# Patient Record
Sex: Male | Born: 1950 | Race: White | Hispanic: No | Smoking: Never smoker
Health system: Southern US, Community
[De-identification: ages and names within clinical notes are randomized; demographics above are authoritative.]

## PROBLEM LIST (undated history)

## (undated) DIAGNOSIS — D126 Benign neoplasm of colon, unspecified: Secondary | ICD-10-CM

## (undated) DIAGNOSIS — G894 Chronic pain syndrome: Secondary | ICD-10-CM

## (undated) DIAGNOSIS — K219 Gastro-esophageal reflux disease without esophagitis: Secondary | ICD-10-CM

## (undated) DIAGNOSIS — K2960 Other gastritis without bleeding: Secondary | ICD-10-CM

## (undated) DIAGNOSIS — M25569 Pain in unspecified knee: Secondary | ICD-10-CM

## (undated) DIAGNOSIS — M545 Low back pain, unspecified: Secondary | ICD-10-CM

## (undated) DIAGNOSIS — J302 Other seasonal allergic rhinitis: Secondary | ICD-10-CM

## (undated) DIAGNOSIS — I429 Cardiomyopathy, unspecified: Secondary | ICD-10-CM

## (undated) DIAGNOSIS — R29898 Other symptoms and signs involving the musculoskeletal system: Secondary | ICD-10-CM

## (undated) DIAGNOSIS — I209 Angina pectoris, unspecified: Secondary | ICD-10-CM

## (undated) DIAGNOSIS — F1120 Opioid dependence, uncomplicated: Secondary | ICD-10-CM

## (undated) DIAGNOSIS — G8918 Other acute postprocedural pain: Secondary | ICD-10-CM

## (undated) DIAGNOSIS — M720 Palmar fascial fibromatosis [Dupuytren]: Secondary | ICD-10-CM

## (undated) DIAGNOSIS — C61 Malignant neoplasm of prostate: Secondary | ICD-10-CM

## (undated) DIAGNOSIS — F32A Depression, unspecified: Secondary | ICD-10-CM

## (undated) DIAGNOSIS — G8929 Other chronic pain: Secondary | ICD-10-CM

## (undated) DIAGNOSIS — G709 Myoneural disorder, unspecified: Secondary | ICD-10-CM

## (undated) DIAGNOSIS — M542 Cervicalgia: Secondary | ICD-10-CM

## (undated) DIAGNOSIS — M797 Fibromyalgia: Secondary | ICD-10-CM

## (undated) DIAGNOSIS — M79602 Pain in left arm: Secondary | ICD-10-CM

## (undated) DIAGNOSIS — K5790 Diverticulosis of intestine, part unspecified, without perforation or abscess without bleeding: Secondary | ICD-10-CM

## (undated) DIAGNOSIS — J45909 Unspecified asthma, uncomplicated: Secondary | ICD-10-CM

## (undated) DIAGNOSIS — M79672 Pain in left foot: Secondary | ICD-10-CM

## (undated) DIAGNOSIS — C4490 Unspecified malignant neoplasm of skin, unspecified: Secondary | ICD-10-CM

## (undated) DIAGNOSIS — E785 Hyperlipidemia, unspecified: Secondary | ICD-10-CM

## (undated) DIAGNOSIS — R002 Palpitations: Secondary | ICD-10-CM

## (undated) DIAGNOSIS — F418 Other specified anxiety disorders: Secondary | ICD-10-CM

## (undated) DIAGNOSIS — M1990 Unspecified osteoarthritis, unspecified site: Secondary | ICD-10-CM

## (undated) DIAGNOSIS — G4730 Sleep apnea, unspecified: Secondary | ICD-10-CM

## (undated) DIAGNOSIS — M79671 Pain in right foot: Secondary | ICD-10-CM

## (undated) DIAGNOSIS — M79601 Pain in right arm: Secondary | ICD-10-CM

## (undated) DIAGNOSIS — I428 Other cardiomyopathies: Secondary | ICD-10-CM

## (undated) HISTORY — DX: Pain in right foot: M79.671

## (undated) HISTORY — DX: Pain in unspecified knee: M25.569

## (undated) HISTORY — DX: Other specified anxiety disorders: F41.8

## (undated) HISTORY — DX: Other acute postprocedural pain: G89.18

## (undated) HISTORY — PX: HAND SURGERY: SHX662

## (undated) HISTORY — DX: Gastro-esophageal reflux disease without esophagitis: K21.9

## (undated) HISTORY — DX: Diverticulosis of intestine, part unspecified, without perforation or abscess without bleeding: K57.90

## (undated) HISTORY — DX: Unspecified osteoarthritis, unspecified site: M19.90

## (undated) HISTORY — DX: Pain in left foot: M79.672

## (undated) HISTORY — PX: OTHER SURGICAL HISTORY: SHX169

## (undated) HISTORY — DX: Sleep apnea, unspecified: G47.30

## (undated) HISTORY — DX: Malignant neoplasm of prostate: C61

## (undated) HISTORY — DX: Other symptoms and signs involving the musculoskeletal system: R29.898

## (undated) HISTORY — DX: Hyperlipidemia, unspecified: E78.5

## (undated) HISTORY — DX: Cervicalgia: M54.2

## (undated) HISTORY — DX: Low back pain, unspecified: M54.50

## (undated) HISTORY — PX: NOSE SURGERY: SHX723

## (undated) HISTORY — PX: HERNIA REPAIR: SHX51

## (undated) HISTORY — DX: Other seasonal allergic rhinitis: J30.2

## (undated) HISTORY — DX: Pain in right arm: M79.601

## (undated) HISTORY — DX: Low back pain: M54.5

## (undated) HISTORY — DX: Pain in left arm: M79.602

## (undated) HISTORY — DX: Palmar fascial fibromatosis (dupuytren): M72.0

---

## 2005-11-23 ENCOUNTER — Ambulatory Visit: Payer: Self-pay | Admitting: Urology

## 2006-01-10 ENCOUNTER — Ambulatory Visit: Payer: Self-pay | Admitting: Radiation Oncology

## 2006-01-13 ENCOUNTER — Ambulatory Visit: Payer: Self-pay | Admitting: Radiation Oncology

## 2006-02-11 ENCOUNTER — Ambulatory Visit: Payer: Self-pay | Admitting: Radiation Oncology

## 2006-03-13 ENCOUNTER — Ambulatory Visit: Payer: Self-pay | Admitting: Radiation Oncology

## 2006-04-13 ENCOUNTER — Ambulatory Visit: Payer: Self-pay | Admitting: Radiation Oncology

## 2006-12-30 ENCOUNTER — Ambulatory Visit: Payer: Self-pay | Admitting: Otolaryngology

## 2007-01-31 ENCOUNTER — Ambulatory Visit: Payer: Self-pay | Admitting: Gastroenterology

## 2007-09-11 ENCOUNTER — Inpatient Hospital Stay: Payer: Self-pay | Admitting: Otolaryngology

## 2007-09-14 HISTORY — PX: TONSILLECTOMY: SUR1361

## 2007-11-23 ENCOUNTER — Ambulatory Visit: Payer: Self-pay | Admitting: Family Medicine

## 2007-12-12 ENCOUNTER — Ambulatory Visit: Payer: Self-pay

## 2007-12-27 ENCOUNTER — Ambulatory Visit: Payer: Self-pay

## 2009-03-11 ENCOUNTER — Ambulatory Visit: Payer: Self-pay | Admitting: Family Medicine

## 2009-05-16 ENCOUNTER — Ambulatory Visit: Payer: Self-pay | Admitting: Gastroenterology

## 2009-05-20 ENCOUNTER — Ambulatory Visit: Payer: Self-pay | Admitting: Gastroenterology

## 2010-10-15 ENCOUNTER — Ambulatory Visit: Payer: Self-pay

## 2010-11-12 ENCOUNTER — Ambulatory Visit: Payer: Self-pay | Admitting: Family Medicine

## 2010-12-14 ENCOUNTER — Ambulatory Visit: Payer: Self-pay | Admitting: Family Medicine

## 2011-04-08 ENCOUNTER — Ambulatory Visit: Payer: Self-pay | Admitting: Otolaryngology

## 2011-05-27 ENCOUNTER — Ambulatory Visit: Payer: Self-pay | Admitting: Otolaryngology

## 2011-10-04 ENCOUNTER — Encounter: Payer: Self-pay | Admitting: Neurology

## 2011-10-15 ENCOUNTER — Encounter: Payer: Self-pay | Admitting: Neurology

## 2012-03-02 ENCOUNTER — Ambulatory Visit: Payer: Self-pay | Admitting: Physical Medicine and Rehabilitation

## 2012-03-21 ENCOUNTER — Ambulatory Visit: Payer: Self-pay | Admitting: Neurology

## 2012-07-25 ENCOUNTER — Other Ambulatory Visit (HOSPITAL_COMMUNITY): Payer: Self-pay | Admitting: Neurosurgery

## 2012-07-25 DIAGNOSIS — C61 Malignant neoplasm of prostate: Secondary | ICD-10-CM

## 2012-08-02 ENCOUNTER — Encounter (HOSPITAL_COMMUNITY)
Admission: RE | Admit: 2012-08-02 | Discharge: 2012-08-02 | Disposition: A | Discharge status: Home / Self Care | Payer: BC Managed Care – PPO | Source: Ambulatory Visit | Attending: Neurosurgery | Admitting: Neurosurgery

## 2012-08-02 DIAGNOSIS — C61 Malignant neoplasm of prostate: Secondary | ICD-10-CM | POA: Insufficient documentation

## 2012-08-02 MED ORDER — TECHNETIUM TC 99M MEDRONATE IV KIT
25.0000 | PACK | Freq: Once | INTRAVENOUS | Status: AC | PRN
  Administered 2012-08-02: 24.6 via INTRAVENOUS

## 2012-08-08 ENCOUNTER — Ambulatory Visit: Payer: Self-pay | Admitting: Specialist

## 2012-08-08 LAB — HEMOGLOBIN: HGB: 14 g/dL (ref 13.0–18.0)

## 2012-08-17 ENCOUNTER — Ambulatory Visit: Payer: Self-pay | Admitting: Specialist

## 2012-08-21 LAB — PATHOLOGY REPORT

## 2012-08-29 ENCOUNTER — Ambulatory Visit: Payer: Self-pay | Admitting: Family Medicine

## 2012-10-10 ENCOUNTER — Ambulatory Visit: Payer: Self-pay | Admitting: Gastroenterology

## 2012-10-24 ENCOUNTER — Ambulatory Visit: Payer: Self-pay | Admitting: Gastroenterology

## 2012-10-24 HISTORY — PX: COLONOSCOPY: SHX174

## 2012-10-26 LAB — PATHOLOGY REPORT

## 2012-11-23 ENCOUNTER — Ambulatory Visit: Payer: Self-pay | Admitting: Neurology

## 2012-12-18 ENCOUNTER — Ambulatory Visit: Payer: Self-pay | Admitting: Gastroenterology

## 2012-12-18 HISTORY — PX: UPPER GI ENDOSCOPY: SHX6162

## 2013-09-13 HISTORY — PX: SKIN CANCER EXCISION: SHX779

## 2013-11-15 ENCOUNTER — Ambulatory Visit: Payer: Self-pay | Admitting: Family Medicine

## 2014-02-18 ENCOUNTER — Ambulatory Visit: Payer: Self-pay | Admitting: Unknown Physician Specialty

## 2014-03-30 ENCOUNTER — Ambulatory Visit: Payer: Self-pay | Admitting: Unknown Physician Specialty

## 2014-08-31 ENCOUNTER — Ambulatory Visit: Payer: Self-pay | Admitting: Unknown Physician Specialty

## 2014-09-30 DIAGNOSIS — M79672 Pain in left foot: Secondary | ICD-10-CM | POA: Insufficient documentation

## 2014-09-30 DIAGNOSIS — M79671 Pain in right foot: Secondary | ICD-10-CM | POA: Insufficient documentation

## 2014-10-11 ENCOUNTER — Ambulatory Visit: Payer: Self-pay | Admitting: Neurology

## 2014-12-03 ENCOUNTER — Ambulatory Visit: Payer: Self-pay | Admitting: Otolaryngology

## 2014-12-04 DIAGNOSIS — R29898 Other symptoms and signs involving the musculoskeletal system: Secondary | ICD-10-CM | POA: Insufficient documentation

## 2014-12-04 DIAGNOSIS — M79601 Pain in right arm: Secondary | ICD-10-CM | POA: Insufficient documentation

## 2014-12-04 DIAGNOSIS — M79602 Pain in left arm: Secondary | ICD-10-CM

## 2014-12-31 NOTE — Op Note (Signed)
PATIENT NAME:  CLINTON, DRAGONE MR#:  [id number] DATE OF BIRTH:  09-May-1951  DATE OF PROCEDURE:  08/17/2012  PREOPERATIVE DIAGNOSIS: Dupuytren's contracture right hand with extension to right ring and little fingers.   POSTOPERATIVE DIAGNOSIS:  Dupuytren's contracture right hand with extension to right ring and little fingers.     PROCEDURES:  1. Excision of Dupuytren's fascia right palm extending out into right ring finger.  2. Excision of Dupuytren's fascia right little finger.   SURGEON: Christophe Louis, M.D.   ANESTHESIA: General.   COMPLICATIONS: None.  TOURNIQUET TIME: 110 minutes.   DESCRIPTION OF PROCEDURE: After adequate induction of general anesthesia, the right upper extremity is thoroughly prepped with alcohol and ChloraPrep and draped in standard sterile fashion. The extremity is wrapped out with the Esmarch bandage and pneumatic tourniquet elevated to 300 mmHg. Loupe magnification is used. The incision is started proximally at the base of the palm at the area where the contracted tissues starts and heads out towards the ring finger. There is a prominent cord extending all the way to the base of the ring finger and then prominent Dupuytren's fascia over the proximal aspect of the proximal phalanx. There is a 40 degree flexion contracture of the MP joint. The volar Bruner incision is extended out distally and under loupe magnification the flaps are elevated and the neurovascular bundles are identified on each side. The neurovascular bundles are protected throughout the case. The large cord of scarred fascia is excised in the palm. The flaps are then elevated over the volar aspect of the ring finger and the digital neurovascular bundles completely followed out to the midportion of the middle phalanx. This allows complete excision of the fascia overlying the proximal phalanx. There is complete release of the MP joint contracture. A separate transverse incision is then made extending  ulnarly over the volar A1 pulley area of the little finger where there is a large area of contracted fascia with a slight flexion contracture of the MP joint. Under loupe magnification, the neurovascular bundles were identified. The flaps are elevated off of the fascia and the fascia is excised. The flexion contracture of the MP joint is completely relieved. The wounds are then thoroughly irrigated multiple times. Ulnar nerve block is then performed proximally using plain 0.5% Marcaine. The wound is then closed with multiple 5-0 nylon sutures. Soft bulky dressing is applied with a volar splint. The patient is returned to the recovery room following tourniquet release having tolerated the procedure quite well.  ____________________________ Lucas Mallow, MD ces:ap D: 08/17/2012 15:44:21 ET T: 08/17/2012 17:09:42 ET JOB#: [id number]  cc: Lucas Mallow, MD, <Dictator>  Lucas Mallow MD ELECTRONICALLY SIGNED 08/18/2012 9:27

## 2015-03-10 ENCOUNTER — Ambulatory Visit: Payer: Self-pay | Admitting: Urology

## 2015-03-10 ENCOUNTER — Encounter (INDEPENDENT_AMBULATORY_CARE_PROVIDER_SITE_OTHER): Payer: Self-pay

## 2015-04-11 DIAGNOSIS — M5410 Radiculopathy, site unspecified: Secondary | ICD-10-CM | POA: Insufficient documentation

## 2015-07-14 ENCOUNTER — Encounter: Payer: Self-pay | Admitting: *Deleted

## 2015-07-18 ENCOUNTER — Ambulatory Visit: Payer: BLUE CROSS/BLUE SHIELD | Admitting: Anesthesiology

## 2015-07-18 ENCOUNTER — Encounter
Admission: RE | Disposition: A | Discharge status: Home / Self Care | Payer: Self-pay | Source: Ambulatory Visit | Attending: Unknown Physician Specialty

## 2015-07-18 ENCOUNTER — Ambulatory Visit
Admission: RE | Admit: 2015-07-18 | Discharge: 2015-07-18 | Disposition: A | Discharge status: Home / Self Care | Payer: BLUE CROSS/BLUE SHIELD | Source: Ambulatory Visit | Attending: Unknown Physician Specialty | Admitting: Unknown Physician Specialty

## 2015-07-18 DIAGNOSIS — Z9889 Other specified postprocedural states: Secondary | ICD-10-CM | POA: Insufficient documentation

## 2015-07-18 DIAGNOSIS — Z8489 Family history of other specified conditions: Secondary | ICD-10-CM | POA: Insufficient documentation

## 2015-07-18 DIAGNOSIS — G4730 Sleep apnea, unspecified: Secondary | ICD-10-CM | POA: Diagnosis not present

## 2015-07-18 DIAGNOSIS — K219 Gastro-esophageal reflux disease without esophagitis: Secondary | ICD-10-CM | POA: Diagnosis not present

## 2015-07-18 DIAGNOSIS — E785 Hyperlipidemia, unspecified: Secondary | ICD-10-CM | POA: Insufficient documentation

## 2015-07-18 DIAGNOSIS — Z791 Long term (current) use of non-steroidal anti-inflammatories (NSAID): Secondary | ICD-10-CM | POA: Insufficient documentation

## 2015-07-18 DIAGNOSIS — Z79899 Other long term (current) drug therapy: Secondary | ICD-10-CM | POA: Insufficient documentation

## 2015-07-18 DIAGNOSIS — J302 Other seasonal allergic rhinitis: Secondary | ICD-10-CM | POA: Diagnosis not present

## 2015-07-18 DIAGNOSIS — L720 Epidermal cyst: Secondary | ICD-10-CM | POA: Insufficient documentation

## 2015-07-18 DIAGNOSIS — Z801 Family history of malignant neoplasm of trachea, bronchus and lung: Secondary | ICD-10-CM | POA: Diagnosis not present

## 2015-07-18 DIAGNOSIS — R2231 Localized swelling, mass and lump, right upper limb: Secondary | ICD-10-CM | POA: Diagnosis present

## 2015-07-18 DIAGNOSIS — Z7982 Long term (current) use of aspirin: Secondary | ICD-10-CM | POA: Insufficient documentation

## 2015-07-18 DIAGNOSIS — M1990 Unspecified osteoarthritis, unspecified site: Secondary | ICD-10-CM | POA: Insufficient documentation

## 2015-07-18 DIAGNOSIS — Z8042 Family history of malignant neoplasm of prostate: Secondary | ICD-10-CM | POA: Diagnosis not present

## 2015-07-18 DIAGNOSIS — Z8546 Personal history of malignant neoplasm of prostate: Secondary | ICD-10-CM | POA: Insufficient documentation

## 2015-07-18 DIAGNOSIS — Z79891 Long term (current) use of opiate analgesic: Secondary | ICD-10-CM | POA: Insufficient documentation

## 2015-07-18 DIAGNOSIS — Z800 Family history of malignant neoplasm of digestive organs: Secondary | ICD-10-CM | POA: Insufficient documentation

## 2015-07-18 DIAGNOSIS — K5790 Diverticulosis of intestine, part unspecified, without perforation or abscess without bleeding: Secondary | ICD-10-CM | POA: Insufficient documentation

## 2015-07-18 HISTORY — PX: MASS EXCISION: SHX2000

## 2015-07-18 SURGERY — EXCISION MASS
Anesthesia: General | Laterality: Right | Wound class: Clean

## 2015-07-18 MED ORDER — PROPOFOL 10 MG/ML IV BOLUS
INTRAVENOUS | Status: DC | PRN
  Administered 2015-07-18: 130 mg via INTRAVENOUS

## 2015-07-18 MED ORDER — GLYCOPYRROLATE 0.2 MG/ML IJ SOLN
INTRAMUSCULAR | Status: DC | PRN
  Administered 2015-07-18: 0.1 mg via INTRAVENOUS

## 2015-07-18 MED ORDER — PROMETHAZINE HCL 25 MG/ML IJ SOLN: 6.2500 mg | INTRAMUSCULAR | Status: DC | PRN

## 2015-07-18 MED ORDER — HYDROMORPHONE HCL 1 MG/ML IJ SOLN
0.2500 mg | INTRAMUSCULAR | Status: DC | PRN
  Administered 2015-07-18 (×2): 0.5 mg via INTRAVENOUS

## 2015-07-18 MED ORDER — DEXAMETHASONE SODIUM PHOSPHATE 4 MG/ML IJ SOLN
INTRAMUSCULAR | Status: DC | PRN
  Administered 2015-07-18 (×2): 4 mg via INTRAVENOUS

## 2015-07-18 MED ORDER — MEPERIDINE HCL 25 MG/ML IJ SOLN: 6.2500 mg | INTRAMUSCULAR | Status: DC | PRN

## 2015-07-18 MED ORDER — OXYCODONE HCL 5 MG/5ML PO SOLN: 5.0000 mg | Freq: Once | ORAL | Status: DC | PRN

## 2015-07-18 MED ORDER — MIDAZOLAM HCL 5 MG/5ML IJ SOLN
INTRAMUSCULAR | Status: DC | PRN
  Administered 2015-07-18: 2 mg via INTRAVENOUS

## 2015-07-18 MED ORDER — FENTANYL CITRATE (PF) 100 MCG/2ML IJ SOLN
INTRAMUSCULAR | Status: DC | PRN
  Administered 2015-07-18: 100 ug via INTRAVENOUS

## 2015-07-18 MED ORDER — OXYCODONE HCL 5 MG PO TABS: 5.0000 mg | ORAL_TABLET | Freq: Once | ORAL | Status: DC | PRN

## 2015-07-18 MED ORDER — LACTATED RINGERS IV SOLN
INTRAVENOUS | Status: DC
  Administered 2015-07-18: 07:00:00 via INTRAVENOUS

## 2015-07-18 MED ORDER — LIDOCAINE HCL (CARDIAC) 20 MG/ML IV SOLN
INTRAVENOUS | Status: DC | PRN
Start: 2015-07-18 — End: 2015-07-18
  Administered 2015-07-18: 50 mg via INTRATRACHEAL

## 2015-07-18 SURGICAL SUPPLY — 22 items
BNDG ESMARK 4X12 TAN STRL LF (GAUZE/BANDAGES/DRESSINGS) ×3 IMPLANT
CANISTER SUCT 1200ML W/VALVE (MISCELLANEOUS) ×3 IMPLANT
COVER LIGHT HANDLE FLEXIBLE (MISCELLANEOUS) ×6 IMPLANT
CUFF TOURN SGL QUICK 18 (TOURNIQUET CUFF) ×3 IMPLANT
DURAPREP 26ML APPLICATOR (WOUND CARE) ×3 IMPLANT
GAUZE SPONGE 4X4 12PLY STRL (GAUZE/BANDAGES/DRESSINGS) ×3 IMPLANT
GAUZE STRETCH 2X75IN STRL (MISCELLANEOUS) ×3 IMPLANT
GLOVE BIO SURGEON STRL SZ7.5 (GLOVE) ×6 IMPLANT
GLOVE BIO SURGEON STRL SZ8 (GLOVE) ×3 IMPLANT
GLOVE INDICATOR 8.0 STRL GRN (GLOVE) ×6 IMPLANT
GOWN STRL REUS W/ TWL LRG LVL3 (GOWN DISPOSABLE) ×2 IMPLANT
GOWN STRL REUS W/TWL LRG LVL3 (GOWN DISPOSABLE) ×4
NS IRRIG 500ML POUR BTL (IV SOLUTION) ×3 IMPLANT
PACK EXTREMITY ARMC (MISCELLANEOUS) ×3 IMPLANT
PAD GROUND ADULT SPLIT (MISCELLANEOUS) ×3 IMPLANT
PADDING CAST 3IN STRL (MISCELLANEOUS) ×2
PADDING CAST BLEND 3X4 STRL (MISCELLANEOUS) ×1 IMPLANT
SCRUB POVIDONE IODINE 4 OZ (MISCELLANEOUS) ×3 IMPLANT
STOCKINETTE STRL 6IN 960660 (GAUZE/BANDAGES/DRESSINGS) ×3 IMPLANT
SUT ETHILON 4-0 (SUTURE) ×2
SUT ETHILON 4-0 FS2 18XMFL BLK (SUTURE) ×1
SUTURE ETHLN 4-0 FS2 18XMF BLK (SUTURE) ×1 IMPLANT

## 2015-07-18 NOTE — Discharge Instructions (Signed)
General Anesthesia, Adult, Care After Refer to this sheet in the next few weeks. These instructions provide you with information on caring for yourself after your procedure. Your health care provider may also give you more specific instructions. Your treatment has been planned according to current medical practices, but problems sometimes occur. Call your health care provider if you have any problems or questions after your procedure. WHAT TO EXPECT AFTER THE PROCEDURE After the procedure, it is typical to experience:  Sleepiness.  Nausea and vomiting. HOME CARE INSTRUCTIONS  For the first 24 hours after general anesthesia:  Have a responsible person with you.  Do not drive a car. If you are alone, do not take public transportation.  Do not drink alcohol.  Do not take medicine that has not been prescribed by your health care provider.  Do not sign important papers or make important decisions.  You may resume a normal diet and activities as directed by your health care provider.  Change bandages (dressings) as directed.  If you have questions or problems that seem related to general anesthesia, call the hospital and ask for the anesthetist or anesthesiologist on call. SEEK MEDICAL CARE IF:  You have nausea and vomiting that continue the day after anesthesia.  You develop a rash. SEEK IMMEDIATE MEDICAL CARE IF:   You have difficulty breathing.  You have chest pain.  You have any allergic problems.   This information is not intended to replace advice given to you by your health care provider. Make sure you discuss any questions you have with your health care provider.   Document Released: 12/06/2000 Document Revised: 09/20/2014 Document Reviewed: 12/29/2011 Elsevier Interactive Patient Education 2016 [employer]  Elevation  Ice pack  Tylenol  RTC in about 10 days  Remove dressing in about 3 days and apply waterproof occlusive bandaid. Change bandaid prn.

## 2015-07-18 NOTE — Anesthesia Postprocedure Evaluation (Signed)
  Anesthesia Post-op Note  Patient: Ricky Mercer  Procedure(s) Performed: Procedure(s) with comments: EXCISION MASS RIGHT HAND (Right) - CPAP  Anesthesia type:General  Patient location: PACU  Post pain: Pain level controlled  Post assessment: Post-op Vital signs reviewed, Patient's Cardiovascular Status Stable, Respiratory Function Stable, Patent Airway and No signs of Nausea or vomiting  Post vital signs: Reviewed and stable  Last Vitals:  Filed Vitals:   07/18/15 0822  BP: 133/92  Pulse: 72  Temp: 36.8 C  Resp: 14    Level of consciousness: awake, alert  and patient cooperative  Complications: No apparent anesthesia complications

## 2015-07-18 NOTE — H&P (Signed)
  H and P reviewed. No changes. Uploaded at later date. 

## 2015-07-18 NOTE — Anesthesia Procedure Notes (Signed)
Procedure Name: LMA Insertion Date/Time: 07/18/2015 7:43 AM Performed by: Mayme Genta Pre-anesthesia Checklist: Patient identified, Emergency Drugs available, Suction available, Timeout performed and Patient being monitored Patient Re-evaluated:Patient Re-evaluated prior to inductionOxygen Delivery Method: Circle system utilized Preoxygenation: Pre-oxygenation with 100% oxygen Intubation Type: IV induction LMA: LMA inserted LMA Size: 4.0 Number of attempts: 1 Placement Confirmation: positive ETCO2 and breath sounds checked- equal and bilateral Tube secured with: Tape

## 2015-07-18 NOTE — Anesthesia Preprocedure Evaluation (Addendum)
Anesthesia Evaluation  Patient identified by MRN, date of birth, ID band Patient awake    Reviewed: Allergy & Precautions, NPO status , Patient's Chart, lab work & pertinent test results, reviewed documented beta blocker date and time   Airway Mallampati: II  TM Distance: >3 FB Neck ROM: Full    Dental no notable dental hx.    Pulmonary sleep apnea and Continuous Positive Airway Pressure Ventilation ,    Pulmonary exam normal        Cardiovascular negative cardio ROS Normal cardiovascular exam     Neuro/Psych PSYCHIATRIC DISORDERS Anxiety    GI/Hepatic Neg liver ROS, GERD  Medicated and Controlled,  Endo/Other  negative endocrine ROS  Renal/GU negative Renal ROS     Musculoskeletal  (+) Arthritis , Osteoarthritis,    Abdominal   Peds  Hematology negative hematology ROS (+)   Anesthesia Other Findings   Reproductive/Obstetrics                             Anesthesia Physical Anesthesia Plan  ASA: II  Anesthesia Plan: General   Post-op Pain Management:    Induction: Intravenous  Airway Management Planned: LMA  Additional Equipment:   Intra-op Plan:   Post-operative Plan:   Informed Consent: I have reviewed the patients History and Physical, chart, labs and discussed the procedure including the risks, benefits and alternatives for the proposed anesthesia with the patient or authorized representative who has indicated his/her understanding and acceptance.     Plan Discussed with: CRNA  Anesthesia Plan Comments:        Anesthesia Quick Evaluation

## 2015-07-18 NOTE — Transfer of Care (Signed)
Immediate Anesthesia Transfer of Care Note  Patient: [name]  Procedure(s) Performed: Procedure(s) with comments: EXCISION MASS RIGHT HAND (Right) - CPAP  Patient Location: PACU  Anesthesia Type: General  Level of Consciousness: awake, alert  and patient cooperative  Airway and Oxygen Therapy: Patient Spontanous Breathing and Patient connected to supplemental oxygen  Post-op Assessment: Post-op Vital signs reviewed, Patient's Cardiovascular Status Stable, Respiratory Function Stable, Patent Airway and No signs of Nausea or vomiting  Post-op Vital Signs: Reviewed and stable  Complications: No apparent anesthesia complications

## 2015-07-18 NOTE — Op Note (Signed)
07/18/2015  8:32 AM  PATIENT:  Ricky Mercer  64 y.o. male  PRE-OPERATIVE DIAGNOSIS:  RIGHT HAND MASS R22.31  POST-OPERATIVE DIAGNOSIS:  RIGHT HAND MASS  PROCEDURE:  Procedure(s) with comments: EXCISION MASS RIGHT HAND (Right) - CPAP  SURGEON:   Mariel Kansky., MD  ASSISTANTS: none  ANESTHESIA: Gen.  IMPLANTS: None  HISTORY: Patient had a remote Dupuytren's contracture excised from his right hand but subsequently developed a small mass in the proximal portion of his incision. It was symptomatic. The patient was desirous of having the mass excised.  OP NOTE: The patient was taken the operating room where satisfactory general anesthesia was achieved. A tourniquet was applied the patient's right upper arm. The right upper extremity was prepped and draped in usual fashion for procedure about the hand. The right upper extremity was then exsanguinated and the tourniquet was inflated. About a 1 inch incision was made obliquely across the mass. This was in the proximal palm and I actually used a portion of the patient's previous incision to expose expose the mass. I bluntly and sharply dissected the mass from the surrounding soft tissue. Dissection was fairly easy. The mass was somewhat fluctuant. It was not entrapped in any significant scar material. I was able to excise the mass in its entirety. I did make a small incision in the mass after it had been removed. The mass was filled with a thick whitish fluid. The mass was about 2 cm in diameter. I did send the mass to pathology.  The tourniquet was released at this time. It was only up 6 minutes. Bleeding was controlled with coagulation cautery. The skin was closed with 4-0 nylon sutures in vertical mattress fashion. Betadine was applied to the wounds followed by a sterile dressing. The patient was then awakened and transferred to a stretcher bed. He was taken to the recovery room in satisfactory condition. Blood loss was  negligible.

## 2015-07-21 ENCOUNTER — Encounter: Payer: Self-pay | Admitting: Unknown Physician Specialty

## 2015-07-22 LAB — SURGICAL PATHOLOGY

## 2015-07-23 ENCOUNTER — Other Ambulatory Visit: Payer: Self-pay | Admitting: Pain Medicine

## 2015-07-23 ENCOUNTER — Ambulatory Visit: Payer: BLUE CROSS/BLUE SHIELD | Attending: Pain Medicine | Admitting: Pain Medicine

## 2015-07-23 ENCOUNTER — Encounter: Payer: Self-pay | Admitting: Pain Medicine

## 2015-07-23 VITALS — BP 129/84 | HR 83 | Temp 98.7°F | Resp 16 | Ht 69.0 in | Wt 193.0 lb

## 2015-07-23 DIAGNOSIS — M79601 Pain in right arm: Secondary | ICD-10-CM

## 2015-07-23 DIAGNOSIS — M47896 Other spondylosis, lumbar region: Secondary | ICD-10-CM | POA: Diagnosis not present

## 2015-07-23 DIAGNOSIS — F1190 Opioid use, unspecified, uncomplicated: Secondary | ICD-10-CM | POA: Diagnosis not present

## 2015-07-23 DIAGNOSIS — M5441 Lumbago with sciatica, right side: Secondary | ICD-10-CM

## 2015-07-23 DIAGNOSIS — M549 Dorsalgia, unspecified: Secondary | ICD-10-CM

## 2015-07-23 DIAGNOSIS — M25559 Pain in unspecified hip: Secondary | ICD-10-CM | POA: Diagnosis not present

## 2015-07-23 DIAGNOSIS — F329 Major depressive disorder, single episode, unspecified: Secondary | ICD-10-CM | POA: Diagnosis not present

## 2015-07-23 DIAGNOSIS — G4730 Sleep apnea, unspecified: Secondary | ICD-10-CM

## 2015-07-23 DIAGNOSIS — M47892 Other spondylosis, cervical region: Secondary | ICD-10-CM

## 2015-07-23 DIAGNOSIS — M545 Low back pain, unspecified: Secondary | ICD-10-CM

## 2015-07-23 DIAGNOSIS — M4802 Spinal stenosis, cervical region: Secondary | ICD-10-CM

## 2015-07-23 DIAGNOSIS — M5442 Lumbago with sciatica, left side: Secondary | ICD-10-CM

## 2015-07-23 DIAGNOSIS — M5382 Other specified dorsopathies, cervical region: Secondary | ICD-10-CM

## 2015-07-23 DIAGNOSIS — E7800 Pure hypercholesterolemia, unspecified: Secondary | ICD-10-CM | POA: Diagnosis not present

## 2015-07-23 DIAGNOSIS — Z79891 Long term (current) use of opiate analgesic: Secondary | ICD-10-CM | POA: Diagnosis not present

## 2015-07-23 DIAGNOSIS — M47812 Spondylosis without myelopathy or radiculopathy, cervical region: Secondary | ICD-10-CM

## 2015-07-23 DIAGNOSIS — M4726 Other spondylosis with radiculopathy, lumbar region: Secondary | ICD-10-CM

## 2015-07-23 DIAGNOSIS — R52 Pain, unspecified: Secondary | ICD-10-CM | POA: Diagnosis present

## 2015-07-23 DIAGNOSIS — Z79899 Other long term (current) drug therapy: Secondary | ICD-10-CM | POA: Diagnosis not present

## 2015-07-23 DIAGNOSIS — R7989 Other specified abnormal findings of blood chemistry: Secondary | ICD-10-CM

## 2015-07-23 DIAGNOSIS — M5416 Radiculopathy, lumbar region: Secondary | ICD-10-CM

## 2015-07-23 DIAGNOSIS — E291 Testicular hypofunction: Secondary | ICD-10-CM

## 2015-07-23 DIAGNOSIS — M79604 Pain in right leg: Secondary | ICD-10-CM

## 2015-07-23 DIAGNOSIS — M5481 Occipital neuralgia: Secondary | ICD-10-CM | POA: Insufficient documentation

## 2015-07-23 DIAGNOSIS — M47816 Spondylosis without myelopathy or radiculopathy, lumbar region: Secondary | ICD-10-CM

## 2015-07-23 DIAGNOSIS — M25561 Pain in right knee: Secondary | ICD-10-CM

## 2015-07-23 DIAGNOSIS — Z8546 Personal history of malignant neoplasm of prostate: Secondary | ICD-10-CM | POA: Diagnosis not present

## 2015-07-23 DIAGNOSIS — Z5181 Encounter for therapeutic drug level monitoring: Secondary | ICD-10-CM | POA: Insufficient documentation

## 2015-07-23 DIAGNOSIS — M5412 Radiculopathy, cervical region: Secondary | ICD-10-CM

## 2015-07-23 DIAGNOSIS — M79673 Pain in unspecified foot: Secondary | ICD-10-CM

## 2015-07-23 DIAGNOSIS — M79602 Pain in left arm: Secondary | ICD-10-CM

## 2015-07-23 DIAGNOSIS — M79605 Pain in left leg: Secondary | ICD-10-CM

## 2015-07-23 DIAGNOSIS — G629 Polyneuropathy, unspecified: Secondary | ICD-10-CM | POA: Insufficient documentation

## 2015-07-23 DIAGNOSIS — M542 Cervicalgia: Secondary | ICD-10-CM

## 2015-07-23 DIAGNOSIS — F1120 Opioid dependence, uncomplicated: Secondary | ICD-10-CM

## 2015-07-23 DIAGNOSIS — M79606 Pain in leg, unspecified: Secondary | ICD-10-CM

## 2015-07-23 DIAGNOSIS — M546 Pain in thoracic spine: Secondary | ICD-10-CM

## 2015-07-23 DIAGNOSIS — F32A Depression, unspecified: Secondary | ICD-10-CM

## 2015-07-23 DIAGNOSIS — G8929 Other chronic pain: Secondary | ICD-10-CM | POA: Insufficient documentation

## 2015-07-23 DIAGNOSIS — M79603 Pain in arm, unspecified: Secondary | ICD-10-CM

## 2015-07-23 MED ORDER — HYDROCODONE-ACETAMINOPHEN 7.5-325 MG PO TABS: 1.0000 | ORAL_TABLET | Freq: Three times a day (TID) | ORAL | Status: DC | PRN

## 2015-07-23 NOTE — Progress Notes (Signed)
Patient's Name: Ricky Mercer MRN: [id number] DOB: 04-20-1951 DOS: 07/23/2015  Primary Reason(s) for Visit: Encounter for Medication Management. CC: Pain   HPI:   Ricky Mercer is a 64 y.o. year old, male patient, who returns today as an established patient. He has Hand weakness; Polyradiculopathy; Chronic pain; Long term current use of opiate analgesic; Long term prescription opiate use; Opiate use; Opiate dependence ([place]); Encounter for therapeutic drug level monitoring; Chronic low back pain (primary symptom) (L>R); Chronic lower extremity pain (Bilateral) (L>R); Chronic lumbar radicular pain (Bilateral) (L>R) (Bilateral S1 dermatomal distribution); Chronic hip pain (Bilateral) (L>R); Chronic upper back pain (Bilateral) (L>R); Chronic neck pain; Chronic foot pain; Chronic pain of right knee; Cervical spondylosis; 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); Chronic upper extremity pain (Bilateral) (L>R); Lumbar spondylosis; Cervical facet hypertrophy; Cervical facet syndrome; History of prostate cancer; Radicular pain of shoulder; Class I Morbid obesity (HCC) (68% higher incidence of chronic low back problems); Peripheral neuropathy (HCC) (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 His primarily concern today is the Pain    The patient returns to the clinic today referring that his last prescription was for Norco 10. Unfortunately, he says that when he takes them Norco 10/325 he has a weird sensation that he does not like. He seems to tolerate better than Norco 7.5/325.  In reviewing his pain today, indicates that the primary source of pain is the lower back with pain being worse on the left than the right. He also has pain in the center of the lower back. His second worst pain is the lower extremities with pain on both of them, but the left is worse than the right.  The pain goes down on both legs to the bottom of his feet in what seems to be an S1 dermatomal distribution. The pain runs down the leg thru the back of the. He also has bilateral hip pain where the left is worse than the right. He had an MRI of the lumbar spine done at [facility] and/or [facility] which we will try to get a hold of. His third worst pain is the upper back with the left side being worst on the right. He also has some pain in the center. From there the pain seems to travel to his fourth worst site which is the neck where the pain is worse on the left more than the right. Neck pain goes to the occipital region and the patient describes bilateral greater occipital neuralgia with the left being greater than the right. He also indicates having had a cervical MRI, which we will also try to get a hold of. His fifth and last status source of pain is the shoulders with the left being worse than the right. In the case of the right side the pain goes only to the shoulder area. In the case of the left side he goes down to the upper arm area and into the left chest area with some significant numbness over today last 2 fingers in his hand in what appears to be a C8 dermatomal distribution/[employer]. He indicates that occasionally he has dropped things from this arm. Today's Pain Score: 8  Clear symptom exaggeration. Reported level of pain is incompatible with clinical observations. Pain Type: Chronic pain Pain Location: Neck Pain Descriptors / Indicators: Sharp Pain Frequency: Constant  Date of Last Visit:   04/23/2015 Service Provided on  Last Visit:   Medication management and postprocedure evaluation.  Pharmacotherapy Review:   Side-effects or Adverse reactions: None reported. Effectiveness: Described as relatively effective but with some room for improvement. Onset of action: Within expected pharmacological parameters. (30-40 minutes) Duration of action: Shorter than expected.  This could suggest rapid metabolism or elimination of the pharmacological agent. (3-4 hours) Peak effect: Timing and results are as within normal expected parameters. (1.25 hours) [place] PMP: Compliant with practice rules and regulations. DST: Compliant with practice rules and regulations. Lab work: No new labs ordered by our practice. Treatment compliance: Compliant. Substance Use Disorder (SUD) Risk Level: Moderate Planned course of action: Adjust therapy. Today we will lower the dose from the [place] 10/325 2 the Norco 7.5/325, but we will allow him to increase the daily dose by 1 so that he takes it 3 times a day instead of twice a day.  Post-Procedure Assessment:  Procedure done on last visit: On 03/31/2015 the patient had a trigger point injection done in the area of the thoracic interspinous ligament and paravertebral muscles in the upper back, but this did not provide him with any long-term benefit. Side-effects or Adverse reactions: No significant issues reported. Sedation: No sedation used during procedure.  Results: No benefits.  Allergies: Mr. Ghan has No Known Allergies.  Meds: The patient has a current medication list which includes the following prescription(s): aspirin, cyclobenzaprine, diphenhydramine-acetaminophen, duloxetine, meloxicam, pantoprazole, phenazopyridine, simvastatin, testosterone, and hydrocodone-acetaminophen. Requested Prescriptions   Signed Prescriptions Disp Refills  . HYDROcodone-acetaminophen (NORCO) 7.5-325 MG tablet 90 tablet 0    Sig: Take 1 tablet by mouth every 8 (eight) hours as needed for moderate pain or severe pain.    ROS: Constitutional: Afebrile, no chills, well hydrated and well nourished Gastrointestinal: negative Musculoskeletal:negative Neurological: negative Behavioral/Psych: negative  PFSH: Medical:  Ricky Mercer  has a past medical history of Hyperlipidemia; GERD (gastroesophageal reflux disease); Prostate cancer ([place]);  Seasonal allergies; Situational anxiety; Diverticulosis; Dupuytren's contracture; Knee pain; Midline low back pain; Pain in neck; Foot pain, bilateral; Bilateral arm pain; Hand weakness; Osteoarthritis; and Sleep apnea. Family: family history includes Colon cancer in his brother; Hyperlipidemia in his sister; Lung cancer in his father; Prostate cancer in his brother. Surgical:  has past surgical history that includes Nose surgery; Hand surgery (Bilateral); Colonoscopy (10/24/2012); Upper gi endoscopy (12/18/2012); Tonsillectomy (2009); Skin cancer excision (Left, 2015); and Mass excision (Right, 07/18/2015). Tobacco:  reports that he has never smoked. He does not have any smokeless tobacco history on file. Alcohol:  reports that he does not drink alcohol. Drug:  has no drug history on file.  Physical Exam: Vitals:  Today's Vitals   07/23/15 0908  BP: 129/84  Pulse: 83  Temp: 98.7 F (37.1 C)  TempSrc: Oral  Resp: 16  Height: 5\' 9"  (1.753 m)  Weight: 193 lb (87.544 kg)  SpO2: 98%  PainSc: 8   PainLoc: Back  Calculated BMI: Body mass index is 28.49 kg/(m^2). General appearance: alert, cooperative, appears stated age, mild distress and moderately obese Eyes: conjunctivae/corneas clear. PERRL, EOM's intact. Fundi benign. Lungs: No evidence respiratory distress, no audible rales or ronchi and no use of accessory muscles of respiration Neck: no adenopathy, no carotid bruit, no JVD, supple, symmetrical, trachea midline and thyroid not enlarged, symmetric, no tenderness/mass/nodules Back: symmetric, no curvature. ROM normal. No CVA tenderness. Extremities: extremities normal, atraumatic, no cyanosis or edema Pulses: 2+ and symmetric Skin: Skin color, texture, turgor normal. No rashes or lesions Neurologic: Grossly normal  Assessment: Encounter Diagnosis:  Primary Diagnosis: Chronic pain [G89.29]  Plan: Tremel was seen today for pain.  Diagnoses and all orders for this visit:  Chronic  pain -     COMPLETE METABOLIC PANEL WITH GFR; Future -     C-reactive protein; Future -     Magnesium; Future -     Sedimentation rate; Future -     Vitamin D2,D3 Panel; Future -     HYDROcodone-acetaminophen (NORCO) 7.5-325 MG tablet; Take 1 tablet by mouth every 8 (eight) hours as needed for moderate pain or severe pain.  Long term current use of opiate analgesic  Long term prescription opiate use -     Drugs of abuse screen w/o alc, rtn urine-sln; Standing  Opiate use  Uncomplicated opioid dependence ([place])  Encounter for therapeutic drug level monitoring  Chronic low back pain (primary symptom) (L>R)  Chronic pain of lower extremity, unspecified laterality  Chronic lumbar radicular pain (Bilateral) (L>R) (Bilateral S1 dermatomal distribution)  Chronic hip pain, unspecified laterality  Chronic upper back pain (Bilateral) (L>R)  Chronic neck pain  Chronic foot pain, unspecified laterality  Chronic pain of right knee  Cervical spondylosis  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)  Chronic pain of both upper extremities  Osteoarthritis of spine with radiculopathy, lumbar region  Cervical facet hypertrophy  Cervical facet syndrome  History of prostate cancer  Radicular pain of shoulder  Morbid obesity due to excess calories (HCC)  Lumbar facet syndrome  Sleep apnea  Depression  High cholesterol  Low testosterone     There are no Patient Instructions on file for this visit. Medications discontinued today:  Medications Discontinued During This Encounter  Medication Reason  . HYDROcodone-acetaminophen (NORCO) 7.5-325 MG per tablet Error  . pregabalin (LYRICA) 50 MG capsule Error  . HYDROcodone-acetaminophen (NORCO) 10-325 MG tablet    Medications administered today:  Mr. Hershman had no medications administered during this visit.  Primary Care Physician: [facility], MD Location: [facility] Note  by: Kathlen Brunswick. Dossie Arbour, M.D, DABA, DABAPM, DABPM, DABIPP, FIPP

## 2015-07-23 NOTE — Progress Notes (Signed)
Pill count: Hydrocodone 10/325 - # 12

## 2015-07-23 NOTE — Progress Notes (Signed)
Had an excision of mass surgery on the right hand on July 18, 2015 by Dr. Jefm Bryant at the [facility] surgical center.

## 2015-07-31 LAB — TOXASSURE SELECT 13 (MW), URINE: PDF: 0

## 2015-08-12 DIAGNOSIS — Z9889 Other specified postprocedural states: Secondary | ICD-10-CM | POA: Insufficient documentation

## 2015-08-14 ENCOUNTER — Other Ambulatory Visit: Payer: Self-pay | Admitting: Family Medicine

## 2015-08-14 DIAGNOSIS — R1013 Epigastric pain: Secondary | ICD-10-CM

## 2015-08-19 ENCOUNTER — Ambulatory Visit: Payer: BLUE CROSS/BLUE SHIELD

## 2015-08-20 ENCOUNTER — Ambulatory Visit: Payer: BLUE CROSS/BLUE SHIELD | Attending: Pain Medicine | Admitting: Pain Medicine

## 2015-08-20 ENCOUNTER — Encounter: Payer: Self-pay | Admitting: Pain Medicine

## 2015-08-20 VITALS — BP 127/88 | HR 87 | Temp 98.0°F | Resp 20 | Ht 69.0 in | Wt 190.0 lb

## 2015-08-20 DIAGNOSIS — M5416 Radiculopathy, lumbar region: Secondary | ICD-10-CM | POA: Diagnosis present

## 2015-08-20 DIAGNOSIS — Z5181 Encounter for therapeutic drug level monitoring: Secondary | ICD-10-CM

## 2015-08-20 DIAGNOSIS — M4726 Other spondylosis with radiculopathy, lumbar region: Secondary | ICD-10-CM | POA: Diagnosis not present

## 2015-08-20 DIAGNOSIS — G8929 Other chronic pain: Secondary | ICD-10-CM | POA: Insufficient documentation

## 2015-08-20 DIAGNOSIS — F1190 Opioid use, unspecified, uncomplicated: Secondary | ICD-10-CM

## 2015-08-20 DIAGNOSIS — M5412 Radiculopathy, cervical region: Secondary | ICD-10-CM | POA: Insufficient documentation

## 2015-08-20 DIAGNOSIS — K5903 Drug induced constipation: Secondary | ICD-10-CM

## 2015-08-20 DIAGNOSIS — Z8546 Personal history of malignant neoplasm of prostate: Secondary | ICD-10-CM

## 2015-08-20 DIAGNOSIS — Z79891 Long term (current) use of opiate analgesic: Secondary | ICD-10-CM

## 2015-08-20 DIAGNOSIS — M79606 Pain in leg, unspecified: Secondary | ICD-10-CM

## 2015-08-20 DIAGNOSIS — F1120 Opioid dependence, uncomplicated: Secondary | ICD-10-CM

## 2015-08-20 DIAGNOSIS — G4730 Sleep apnea, unspecified: Secondary | ICD-10-CM

## 2015-08-20 DIAGNOSIS — M545 Low back pain: Secondary | ICD-10-CM

## 2015-08-20 DIAGNOSIS — T402X5A Adverse effect of other opioids, initial encounter: Secondary | ICD-10-CM | POA: Insufficient documentation

## 2015-08-20 DIAGNOSIS — Z79899 Other long term (current) drug therapy: Secondary | ICD-10-CM

## 2015-08-20 MED ORDER — BENEFIBER PO POWD: ORAL | Status: DC

## 2015-08-20 MED ORDER — HYDROCODONE-ACETAMINOPHEN 10-325 MG PO TABS: 1.0000 | ORAL_TABLET | Freq: Four times a day (QID) | ORAL | Status: DC | PRN

## 2015-08-20 MED ORDER — BISACODYL 5 MG PO TBEC
10.0000 mg | DELAYED_RELEASE_TABLET | Freq: Every day | ORAL | Status: DC | PRN
Start: 2015-08-20 — End: 2016-03-18

## 2015-08-20 MED ORDER — DOCUSATE SODIUM 100 MG PO CAPS
100.0000 mg | ORAL_CAPSULE | Freq: Two times a day (BID) | ORAL | Status: DC
Start: 2015-08-20 — End: 2016-03-18

## 2015-08-20 NOTE — Progress Notes (Signed)
Safety precautions to be maintained throughout the outpatient stay will include: orient to surroundings, keep bed in low position, maintain call bell within reach at all times, provide assistance with transfer out of bed and ambulation.  Hydrocodone 7.5mg  21/60 remaining -filled 08/01/15 Was started on citalopram 08/19/15 for aniety

## 2015-08-20 NOTE — Patient Instructions (Signed)
You were given 3 prescriptions  for Hydrocodone today. Please get the labs done that were ordered last month.

## 2015-08-21 ENCOUNTER — Other Ambulatory Visit
Admission: RE | Admit: 2015-08-21 | Discharge: 2015-08-21 | Disposition: A | Discharge status: Home / Self Care | Payer: BLUE CROSS/BLUE SHIELD | Source: Ambulatory Visit | Attending: Pain Medicine | Admitting: Pain Medicine

## 2015-08-21 ENCOUNTER — Other Ambulatory Visit: Payer: Self-pay

## 2015-08-21 DIAGNOSIS — G8929 Other chronic pain: Secondary | ICD-10-CM | POA: Diagnosis not present

## 2015-08-21 LAB — COMPREHENSIVE METABOLIC PANEL
ALBUMIN: 4.1 g/dL (ref 3.5–5.0)
ALT: 26 U/L (ref 17–63)
AST: 23 U/L (ref 15–41)
Alkaline Phosphatase: 57 U/L (ref 38–126)
Anion gap: 5 (ref 5–15)
BILIRUBIN TOTAL: 0.7 mg/dL (ref 0.3–1.2)
BUN: 13 mg/dL (ref 6–20)
CHLORIDE: 108 mmol/L (ref 101–111)
CO2: 25 mmol/L (ref 22–32)
CREATININE: 1.07 mg/dL (ref 0.61–1.24)
Calcium: 9.2 mg/dL (ref 8.9–10.3)
GFR calc Af Amer: >60 mL/min (ref >60)
GFR calc non Af Amer: >60 mL/min (ref >60)
GLUCOSE: 127 mg/dL — AB (ref 65–99)
POTASSIUM: 4 mmol/L (ref 3.5–5.1)
Sodium: 138 mmol/L (ref 135–145)
Total Protein: 7.2 g/dL (ref 6.5–8.1)

## 2015-08-21 LAB — C-REACTIVE PROTEIN

## 2015-08-21 LAB — MAGNESIUM: MAGNESIUM: 2.1 mg/dL (ref 1.7–2.4)

## 2015-08-21 LAB — SEDIMENTATION RATE: Sed Rate: 4 mm/hr (ref 0–20)

## 2015-08-22 ENCOUNTER — Encounter: Payer: Self-pay | Admitting: Pain Medicine

## 2015-08-22 ENCOUNTER — Other Ambulatory Visit: Payer: Self-pay | Admitting: Pain Medicine

## 2015-08-22 DIAGNOSIS — G8929 Other chronic pain: Secondary | ICD-10-CM | POA: Insufficient documentation

## 2015-08-22 DIAGNOSIS — R065 Mouth breathing: Secondary | ICD-10-CM | POA: Insufficient documentation

## 2015-08-22 DIAGNOSIS — M25512 Pain in left shoulder: Secondary | ICD-10-CM

## 2015-08-22 DIAGNOSIS — M25511 Pain in right shoulder: Secondary | ICD-10-CM

## 2015-08-22 NOTE — Progress Notes (Signed)
Patient's Name: Ricky Mercer MRN: [id number] DOB: 04-08-1951 DOS: 08/20/2015  Primary Reason(s) for Visit: Encounter for Medication Management CC: Back Pain and Shoulder Pain   HPI:   Ricky Mercer is a 64 y.o. year old, male patient, who returns today as an established patient. He has Hand weakness; Polyradiculopathy; Chronic pain; Long term current use of opiate analgesic; Long term prescription opiate use; Opiate use; Opiate dependence ([place]); Encounter for therapeutic drug level monitoring; Chronic low back pain  (Location of Primary Pain) (L>R) (LBP>LEP); Chronic lower extremity pain (Bilateral) (L>R); Chronic lumbar radicular pain (Bilateral) (L>R) (Bilateral S1 dermatomal distribution); Chronic hip pain (Bilateral) (L>R); Chronic upper back pain (Bilateral) (L>R); Chronic neck pain (neck<shoulder) (L>R); Chronic foot pain; Chronic knee pain (Right); Cervical spondylosis; 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); Chronic upper extremity pain (Bilateral) (L>R); Lumbar spondylosis; Cervical facet hypertrophy; Cervical facet syndrome; History of prostate cancer; Radicular pain of shoulder; Class I Morbid obesity (HCC) (68% higher incidence of chronic low back problems); Peripheral neuropathy (HCC) (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); Lumbar facet syndrome; Sleep apnea; Depression; High cholesterol; Low testosterone; History of surgical procedure; Therapeutic opioid-induced constipation (OIC); Therapeutic opioid induced constipation; Chronic shoulder pain (Bilateral) (L>R); and Breathing orally on his problem list.. His primarily concern today is the Back Pain and Shoulder Pain     The patient comes into the clinic today for pharmacological management of his chronic pain. Today we have reviewed his symptoms and his primary pain source is the lower back with the left being worst on the right. Following this, second source of pain is that of the lower extremity with  the left being worse than the right. In the case of the right lower extremity the pain goes all the way down to the bottom of the foot in what seems to be an S1 dermatomal distribution. In a similar manner the left lower extremity pain also goes all the way down to the bottom of the foot in what seems to be an S1 dermatomal/radicular distribution. His third source of pain is the hip which although bilateral, seems to be worse on the left when compared to the right. Next follows day shoulder pain which is also bilateral with the left being worse than the right. We then have the neck pain with the left being worse than the right and the upper extremities, again with the left being worse than the right. The patient's cervical problems seem to be affecting known only the neck, shoulder, and upper extremity, but he also complains of occasional pain in that pectoralis muscle, bilaterally, with the left being worse than the right.  His current medication regimen and includes the use of hydrocodone/APAP 7.5/325, which he takes 1 tablet by mouth every 8 hours when necessary for pain.  Today's Pain Score: 7 , clinically he looks more like a 2-3/10. Reported level of pain is incompatible with clinical obrservations. This may be secondary to a possible lack of understanding on how the pain scale works. Pain Type: Chronic pain Pain Location: Back Pain Orientation: Mid Pain Descriptors / Indicators: Burning, Aching, Constant, Tingling Pain Frequency: Constant  Date of Last Visit: 07/23/15 Service Provided on Last Visit: Evaluation  Pharmacotherapy Review:   Side-effects or Adverse reactions: Opioid-induced constipation. Effectiveness: Described as relatively effective but with some room for improvement. Onset of action: Within expected pharmacological parameters. (30-45 minutes) Duration of action: Within normal limits for medication. (3-4 hours) Peak effect: Timing and results are as within normal  expected  parameters. At its peak effect the patient describes that he lowers the pain by 50%. [place] PMP: Compliant with practice rules and regulations. UDS Results: His 07/23/2015 UDS was within normal limits. Patient remains compliant. UDS Interpretation: Patient appears to be compliant with practice rules and regulations Medication Assessment Form: Reviewed. Patient indicates being compliant with therapy Treatment compliance: Compliant Substance Use Disorder (SUD) Risk Level: Low Pharmacologic Plan: Because the peak effect of his medication only lowers to pain by 50%, we will increase the dose from 7.5-10. In addition to this, because of the duration is described to be 3-4 hours only, we'll shorten the interval between doses to 6 hours instead of 8.  Intrathecal Pump Therapy: Side-effects or Adverse reactions: None reported. Effectiveness: Described as relatively effective, allowing for increase in activities of daily living (ADL). Plan: No changes in programming.  Allergies: Ricky Mercer has No Known Allergies.  Meds: The patient has a current medication list which includes the following prescription(s): aspirin, citalopram, diphenhydramine-acetaminophen, meloxicam, pantoprazole, phenazopyridine, simvastatin, testosterone, bisacodyl, docusate sodium, hydrocodone-acetaminophen, hydrocodone-acetaminophen, hydrocodone-acetaminophen, and benefiber. Requested Prescriptions   Signed Prescriptions Disp Refills  . HYDROcodone-acetaminophen (NORCO) 10-325 MG tablet 120 tablet 0    Sig: Take 1 tablet by mouth every 6 (six) hours as needed for moderate pain or severe pain.  Marland Kitchen HYDROcodone-acetaminophen (NORCO) 10-325 MG tablet 120 tablet 0    Sig: Take 1 tablet by mouth every 6 (six) hours as needed for moderate pain or severe pain.  Marland Kitchen HYDROcodone-acetaminophen (NORCO) 10-325 MG tablet 120 tablet 0    Sig: Take 1 tablet by mouth every 6 (six) hours as needed for moderate pain or severe pain.  . Wheat Dextrin  (BENEFIBER) POWD 500 g PRN    Sig: Stir 2 tsp. TID into 4-8 oz of any non-carbonated beverage or soft food (hot or cold)  . bisacodyl (DULCOLAX) 5 MG EC tablet 30 tablet PRN    Sig: Take 2 tablets (10 mg total) by mouth daily as needed for moderate constipation.  . docusate sodium (COLACE) 100 MG capsule 120 capsule PRN    Sig: Take 1 capsule (100 mg total) by mouth 2 (two) times daily. Do not use longer than 7 days.    ROS: Constitutional: Afebrile, no chills, well hydrated and well nourished Gastrointestinal: negative Musculoskeletal:negative Neurological: negative Behavioral/Psych: negative  PFSH: Medical:  Ricky Mercer  has a past medical history of Hyperlipidemia; GERD (gastroesophageal reflux disease); Prostate cancer ([place]); Seasonal allergies; Situational anxiety; Diverticulosis; Dupuytren's contracture; Knee pain; Midline low back pain; Pain in neck; Foot pain, bilateral; Bilateral arm pain; Hand weakness; Osteoarthritis; and Sleep apnea. Family: family history includes Colon cancer in his brother; Hyperlipidemia in his sister; Lung cancer in his father; Prostate cancer in his brother. Surgical:  has past surgical history that includes Nose surgery; Hand surgery (Bilateral); Colonoscopy (10/24/2012); Upper gi endoscopy (12/18/2012); Tonsillectomy (2009); Skin cancer excision (Left, 2015); and Mass excision (Right, 07/18/2015). Tobacco:  reports that he has never smoked. He does not have any smokeless tobacco history on file. Alcohol:  reports that he does not drink alcohol. Drug:  reports that he does not use illicit drugs.  Physical Exam: Vitals:  Today's Vitals   08/20/15 1340 08/20/15 1341  BP: 127/88   Pulse: 87   Temp: 98 F (36.7 C)   Resp: 20   Height: 5\' 9"  (1.753 m)   Weight: 190 lb (86.183 kg)   SpO2: 98%   PainSc:  7   Calculated BMI: Body mass  index is 28.05 kg/(m^2). General appearance: alert, cooperative, appears stated age and no distress Eyes:  PERLA Respiratory: No evidence respiratory distress, no audible rales or ronchi and no use of accessory muscles of respiration Neck: no adenopathy, no carotid bruit, no JVD, supple, symmetrical, trachea midline and thyroid not enlarged, symmetric, no tenderness/mass/nodules  Cervical Spine ROM: Adequate for flexion, extension, rotation, and lateral bending Palpation: No palpable trigger points  Upper Extremities ROM: Adequate bilaterally Strength: 5/5 for all flexors and extensors of the upper extremity, bilaterally Pulses: Palpable bilaterally Neurologic: No allodynia, No hyperesthesia, No hyperpathia and No sensory abnormalities  Lumbar Spine ROM: Adequate for flexion, extension, rotation, and lateral bending Palpation: No palpable trigger points Lumbar Hyperextension and rotation: Non-contributory Patrick's Maneuver: Non-contributory  Lower Extremities ROM: Adequate bilaterally Strength: 5/5 for all flexors and extensors of the lower extremity, bilaterally Pulses: Palpable bilaterally Neurologic: No allodynia, No hyperesthesia, No hyperpathia, No sensory abnormalities and No antalgic gait or posture  Assessment: Encounter Diagnosis:  Primary Diagnosis: Chronic pain [G89.29]  Plan: Interventional: No interventional treatment planned or required at this time.  Ricky Mercer was seen today for back pain and shoulder pain.  Diagnoses and all orders for this visit:  Chronic pain -     HYDROcodone-acetaminophen (NORCO) 10-325 MG tablet; Take 1 tablet by mouth every 6 (six) hours as needed for moderate pain or severe pain. -     HYDROcodone-acetaminophen (NORCO) 10-325 MG tablet; Take 1 tablet by mouth every 6 (six) hours as needed for moderate pain or severe pain. -     HYDROcodone-acetaminophen (NORCO) 10-325 MG tablet; Take 1 tablet by mouth every 6 (six) hours as needed for moderate pain or severe pain.  Radicular pain of shoulder -     MR Cervical Spine Wo Contrast;  Future  Osteoarthritis of spine with radiculopathy, lumbar region -     MR Cervical Spine Wo Contrast; Future  History of prostate cancer  Chronic lumbar radicular pain (Bilateral) (L>R) (Bilateral S1 dermatomal distribution) -     MR Lumbar Spine Wo Contrast; Future  Chronic low back pain (primary symptom) (L>R) -     MR Lumbar Spine Wo Contrast; Future  Chronic pain of lower extremity, unspecified laterality  Opiate use -     Wheat Dextrin (BENEFIBER) POWD; Stir 2 tsp. TID into 4-8 oz of any non-carbonated beverage or soft food (hot or cold) -     bisacodyl (DULCOLAX) 5 MG EC tablet; Take 2 tablets (10 mg total) by mouth daily as needed for moderate constipation. -     docusate sodium (COLACE) 100 MG capsule; Take 1 capsule (100 mg total) by mouth 2 (two) times daily. Do not use longer than 7 days.  Uncomplicated opioid dependence ([place])  Long term prescription opiate use  Long term current use of opiate analgesic  Encounter for therapeutic drug level monitoring  Sleep apnea  Therapeutic opioid induced constipation -     Wheat Dextrin (BENEFIBER) POWD; Stir 2 tsp. TID into 4-8 oz of any non-carbonated beverage or soft food (hot or cold) -     bisacodyl (DULCOLAX) 5 MG EC tablet; Take 2 tablets (10 mg total) by mouth daily as needed for moderate constipation. -     docusate sodium (COLACE) 100 MG capsule; Take 1 capsule (100 mg total) by mouth 2 (two) times daily. Do not use longer than 7 days.     Patient Instructions  You were given 3 prescriptions  for Hydrocodone today. Please get the labs done  that were ordered last month.  Medications discontinued today:  Medications Discontinued During This Encounter  Medication Reason  . DULoxetine (CYMBALTA) 30 MG capsule Error  . HYDROcodone-acetaminophen (NORCO) 7.5-325 MG tablet Change in therapy  . cyclobenzaprine (FLEXERIL) 5 MG tablet Error   Medications administered today:  Ricky Mercer had no medications administered  during this visit.  Primary Care Physician: [facility], MD Location: [facility] Outpatient Pain Management Facility Note by: Kathlen Brunswick. Dossie Arbour, M.D, DABA, DABAPM, DABPM, DABIPP, FIPP

## 2015-08-22 NOTE — Addendum Note (Signed)
Addended by: Milinda Pointer A on: 08/22/2015 02:43 PM   Modules accepted: Orders, Medications

## 2015-08-25 ENCOUNTER — Ambulatory Visit
Admission: RE | Admit: 2015-08-25 | Discharge: 2015-08-25 | Disposition: A | Discharge status: Home / Self Care | Payer: BLUE CROSS/BLUE SHIELD | Source: Ambulatory Visit | Attending: Family Medicine | Admitting: Family Medicine

## 2015-08-25 DIAGNOSIS — R1013 Epigastric pain: Secondary | ICD-10-CM | POA: Diagnosis present

## 2015-08-25 DIAGNOSIS — K824 Cholesterolosis of gallbladder: Secondary | ICD-10-CM | POA: Insufficient documentation

## 2015-09-03 ENCOUNTER — Other Ambulatory Visit: Payer: Self-pay | Admitting: Gastroenterology

## 2015-09-03 DIAGNOSIS — R1084 Generalized abdominal pain: Secondary | ICD-10-CM

## 2015-09-05 ENCOUNTER — Ambulatory Visit
Admission: RE | Admit: 2015-09-05 | Discharge: 2015-09-05 | Disposition: A | Discharge status: Home / Self Care | Payer: BLUE CROSS/BLUE SHIELD | Source: Ambulatory Visit | Attending: Gastroenterology | Admitting: Gastroenterology

## 2015-09-05 DIAGNOSIS — R1013 Epigastric pain: Secondary | ICD-10-CM | POA: Diagnosis present

## 2015-09-05 DIAGNOSIS — R1084 Generalized abdominal pain: Secondary | ICD-10-CM | POA: Insufficient documentation

## 2015-09-05 MED ORDER — IOHEXOL 350 MG/ML SOLN
125.0000 mL | Freq: Once | INTRAVENOUS | Status: AC | PRN
  Administered 2015-09-05: 125 mL via INTRAVENOUS

## 2015-09-10 ENCOUNTER — Encounter: Payer: Self-pay | Admitting: Family Medicine

## 2015-10-01 ENCOUNTER — Other Ambulatory Visit: Payer: Self-pay | Admitting: Gastroenterology

## 2015-10-01 DIAGNOSIS — R1013 Epigastric pain: Secondary | ICD-10-CM

## 2015-10-01 DIAGNOSIS — R1011 Right upper quadrant pain: Secondary | ICD-10-CM

## 2015-10-14 ENCOUNTER — Ambulatory Visit
Admission: RE | Admit: 2015-10-14 | Discharge: 2015-10-14 | Disposition: A | Discharge status: Home / Self Care | Payer: BLUE CROSS/BLUE SHIELD | Source: Ambulatory Visit | Attending: Gastroenterology | Admitting: Gastroenterology

## 2015-10-14 ENCOUNTER — Ambulatory Visit: Admission: RE | Admit: 2015-10-14 | Payer: BLUE CROSS/BLUE SHIELD | Source: Ambulatory Visit

## 2015-10-14 DIAGNOSIS — R1011 Right upper quadrant pain: Secondary | ICD-10-CM | POA: Diagnosis present

## 2015-10-14 DIAGNOSIS — R1013 Epigastric pain: Secondary | ICD-10-CM | POA: Diagnosis present

## 2015-10-14 MED ORDER — TECHNETIUM TC 99M MEBROFENIN IV KIT
5.2290 | PACK | Freq: Once | INTRAVENOUS | Status: AC | PRN
  Administered 2015-10-14: 5.229 via INTRAVENOUS

## 2015-10-14 MED ORDER — SINCALIDE 5 MCG IJ SOLR
0.0200 ug/kg | Freq: Once | INTRAMUSCULAR | Status: AC
  Administered 2015-10-14: 1.73 ug via INTRAVENOUS

## 2015-10-20 ENCOUNTER — Other Ambulatory Visit: Payer: Self-pay

## 2015-10-27 ENCOUNTER — Encounter: Payer: Self-pay | Admitting: *Deleted

## 2015-10-28 ENCOUNTER — Encounter: Payer: Self-pay | Admitting: *Deleted

## 2015-10-28 ENCOUNTER — Ambulatory Visit
Admission: RE | Admit: 2015-10-28 | Discharge: 2015-10-28 | Disposition: A | Discharge status: Home / Self Care | Payer: BLUE CROSS/BLUE SHIELD | Source: Ambulatory Visit | Attending: Gastroenterology | Admitting: Gastroenterology

## 2015-10-28 ENCOUNTER — Ambulatory Visit: Payer: BLUE CROSS/BLUE SHIELD | Admitting: Anesthesiology

## 2015-10-28 ENCOUNTER — Encounter
Admission: RE | Disposition: A | Discharge status: Home / Self Care | Payer: Self-pay | Source: Ambulatory Visit | Attending: Gastroenterology

## 2015-10-28 DIAGNOSIS — R1011 Right upper quadrant pain: Secondary | ICD-10-CM | POA: Diagnosis not present

## 2015-10-28 DIAGNOSIS — K319 Disease of stomach and duodenum, unspecified: Secondary | ICD-10-CM | POA: Insufficient documentation

## 2015-10-28 DIAGNOSIS — Z8546 Personal history of malignant neoplasm of prostate: Secondary | ICD-10-CM | POA: Diagnosis not present

## 2015-10-28 DIAGNOSIS — K2960 Other gastritis without bleeding: Secondary | ICD-10-CM | POA: Diagnosis not present

## 2015-10-28 DIAGNOSIS — R1012 Left upper quadrant pain: Secondary | ICD-10-CM | POA: Insufficient documentation

## 2015-10-28 DIAGNOSIS — K219 Gastro-esophageal reflux disease without esophagitis: Secondary | ICD-10-CM | POA: Diagnosis not present

## 2015-10-28 DIAGNOSIS — G4730 Sleep apnea, unspecified: Secondary | ICD-10-CM | POA: Insufficient documentation

## 2015-10-28 DIAGNOSIS — Z79899 Other long term (current) drug therapy: Secondary | ICD-10-CM | POA: Insufficient documentation

## 2015-10-28 DIAGNOSIS — M1990 Unspecified osteoarthritis, unspecified site: Secondary | ICD-10-CM | POA: Insufficient documentation

## 2015-10-28 DIAGNOSIS — E785 Hyperlipidemia, unspecified: Secondary | ICD-10-CM | POA: Diagnosis not present

## 2015-10-28 DIAGNOSIS — R1013 Epigastric pain: Secondary | ICD-10-CM | POA: Insufficient documentation

## 2015-10-28 DIAGNOSIS — Z7982 Long term (current) use of aspirin: Secondary | ICD-10-CM | POA: Diagnosis not present

## 2015-10-28 HISTORY — PX: ESOPHAGOGASTRODUODENOSCOPY (EGD) WITH PROPOFOL: SHX5813

## 2015-10-28 SURGERY — ESOPHAGOGASTRODUODENOSCOPY (EGD) WITH PROPOFOL
Anesthesia: General

## 2015-10-28 MED ORDER — LIDOCAINE HCL (CARDIAC) 20 MG/ML IV SOLN
INTRAVENOUS | Status: DC | PRN
  Administered 2015-10-28: 40 mg via INTRAVENOUS

## 2015-10-28 MED ORDER — PROPOFOL 500 MG/50ML IV EMUL
INTRAVENOUS | Status: DC | PRN
  Administered 2015-10-28: 120 ug/kg/min via INTRAVENOUS

## 2015-10-28 MED ORDER — SODIUM CHLORIDE 0.9 % IV SOLN
INTRAVENOUS | Status: DC
  Administered 2015-10-28: 13:00:00 via INTRAVENOUS
  Administered 2015-10-28: 1000 mL via INTRAVENOUS

## 2015-10-28 MED ORDER — FENTANYL CITRATE (PF) 100 MCG/2ML IJ SOLN
INTRAMUSCULAR | Status: DC | PRN
  Administered 2015-10-28: 50 ug via INTRAVENOUS

## 2015-10-28 MED ORDER — MIDAZOLAM HCL 2 MG/2ML IJ SOLN
INTRAMUSCULAR | Status: DC | PRN
  Administered 2015-10-28: 2 mg via INTRAVENOUS

## 2015-10-28 MED ORDER — EPHEDRINE SULFATE 50 MG/ML IJ SOLN
INTRAMUSCULAR | Status: DC | PRN
  Administered 2015-10-28: 5 mg via INTRAVENOUS

## 2015-10-28 MED ORDER — SODIUM CHLORIDE 0.9 % IV SOLN: INTRAVENOUS | Status: DC

## 2015-10-28 NOTE — Anesthesia Procedure Notes (Signed)
Performed by: COOK-MARTIN, Ila Landowski Pre-anesthesia Checklist: Patient identified, Emergency Drugs available, Suction available, Patient being monitored and Timeout performed Patient Re-evaluated:Patient Re-evaluated prior to inductionPreoxygenation: Pre-oxygenation with 100% oxygen Intubation Type: IV induction Airway Equipment and Method: Bite block Placement Confirmation: positive ETCO2 and CO2 detector     

## 2015-10-28 NOTE — Anesthesia Postprocedure Evaluation (Signed)
Anesthesia Post Note  Patient: Ricky Mercer  Procedure(s) Performed: Procedure(s) (LRB): ESOPHAGOGASTRODUODENOSCOPY (EGD) WITH PROPOFOL (N/A)  Patient location during evaluation: Endoscopy Anesthesia Type: General Level of consciousness: awake and alert Pain management: pain level controlled Vital Signs Assessment: post-procedure vital signs reviewed and stable Respiratory status: spontaneous breathing, nonlabored ventilation, respiratory function stable and patient connected to nasal cannula oxygen Cardiovascular status: blood pressure returned to baseline and stable Postop Assessment: no signs of nausea or vomiting Anesthetic complications: no    Last Vitals:  Filed Vitals:   10/28/15 1510 10/28/15 1520  BP: 106/71 131/92  Pulse: 88 80  Temp:    Resp: 23 15    Last Pain: There were no vitals filed for this visit.               Staley Lunz S

## 2015-10-28 NOTE — Transfer of Care (Signed)
Immediate Anesthesia Transfer of Care Note  Patient: Ricky Mercer  Procedure(s) Performed: Procedure(s): ESOPHAGOGASTRODUODENOSCOPY (EGD) WITH PROPOFOL (N/A)  Patient Location: PACU  Anesthesia Type:General  Level of Consciousness: awake and sedated  Airway & Oxygen Therapy: Patient Spontanous Breathing and Patient connected to nasal cannula oxygen  Post-op Assessment: Report given to RN and Post -op Vital signs reviewed and stable  Post vital signs: Reviewed and stable  Last Vitals:  Filed Vitals:   10/28/15 1315  BP: 119/83  Pulse: 76  Temp: 36.4 C  Resp: 20    Complications: No apparent anesthesia complications

## 2015-10-28 NOTE — Anesthesia Preprocedure Evaluation (Signed)
Anesthesia Evaluation  Patient identified by MRN, date of birth, ID band Patient awake    Reviewed: Allergy & Precautions, NPO status , Patient's Chart, lab work & pertinent test results, reviewed documented beta blocker date and time   Airway Mallampati: II  TM Distance: >3 FB     Dental  (+) Chipped   Pulmonary sleep apnea ,           Cardiovascular      Neuro/Psych PSYCHIATRIC DISORDERS Anxiety  Neuromuscular disease    GI/Hepatic GERD  Controlled,  Endo/Other    Renal/GU      Musculoskeletal  (+) Arthritis ,   Abdominal   Peds  Hematology   Anesthesia Other Findings   Reproductive/Obstetrics                             Anesthesia Physical Anesthesia Plan  ASA: III  Anesthesia Plan: General   Post-op Pain Management:    Induction: Intravenous  Airway Management Planned: Nasal Cannula  Additional Equipment:   Intra-op Plan:   Post-operative Plan:   Informed Consent: I have reviewed the patients History and Physical, chart, labs and discussed the procedure including the risks, benefits and alternatives for the proposed anesthesia with the patient or authorized representative who has indicated his/her understanding and acceptance.     Plan Discussed with: CRNA  Anesthesia Plan Comments:         Anesthesia Quick Evaluation

## 2015-10-28 NOTE — Op Note (Signed)
[facility] Patient Name: Ricky Mercer Procedure Date: 10/28/2015 2:26 PM MRN: [id number] Account #: [id number] Date of Birth: [birth year] Admit Type: Outpatient Age: 65 Room: [facility] ENDO ROOM 3 Gender: Male Note Status: Finalized Procedure:            Upper GI endoscopy Indications:          Epigastric abdominal pain, Abdominal pain in the right                        upper quadrant, Abdominal pain in the left upper                        quadrant, Dyspepsia Providers:            Lollie Sails, MD Referring MD:         Sofie Hartigan (Referring MD) Medicines:            Monitored Anesthesia Care Complications:        No immediate complications. Procedure:            Pre-Anesthesia Assessment:                       - ASA Grade Assessment: III - A patient with severe                        systemic disease.                       After obtaining informed consent, the endoscope was                        passed under direct vision. Throughout the procedure,                        the patient's blood pressure, pulse, and oxygen                        saturations were monitored continuously. The Endoscope                        was introduced through the mouth, and advanced to the                        third part of duodenum. The upper GI endoscopy was                        accomplished without difficulty. The patient tolerated                        the procedure well. Findings:      The Z-line was irregular. Biopsies were taken with a cold forceps for       histology.      The exam of the esophagus was otherwise normal.      Patchy mild inflammation characterized by congestion (edema), erosions       and erythema was found in the gastric antrum.      Patchy minimal inflammation characterized by erythema was found in the       gastric body. Biopsies were taken with a cold forceps for histology.      The cardia and gastric fundus were  normal on retroflexion.      The examined duodenum was normal. Impression:           - Z-line irregular. Biopsied.                       - Erosive gastritis.                       - Gastritis. Biopsied.                       - Normal examined duodenum. Recommendation:       - Discharge patient to home.                       - Use Protonix (pantoprazole) 40 mg PO daily daily. Procedure Code(s):    --- Professional ---                       [phone], Esophagogastroduodenoscopy, flexible, transoral;                        with biopsy, single or multiple Diagnosis Code(s):    --- Professional ---                       K22.8, Other specified diseases of esophagus                       K29.60, Other gastritis without bleeding                       K29.70, Gastritis, unspecified, without bleeding                       R10.13, Epigastric pain                       R10.11, Right upper quadrant pain                       R10.12, Left upper quadrant pain CPT copyright 2016 American Medical Association. All rights reserved. The codes documented in this report are preliminary and upon coder review may  be revised to meet current compliance requirements. Lollie Sails, MD 10/28/2015 2:46:45 PM This report has been signed electronically. Number of Addenda: 0 Note Initiated On: 10/28/2015 2:26 PM      [facility]

## 2015-10-28 NOTE — H&P (Signed)
Outpatient short stay form Pre-procedure 10/28/2015 2:19 PM Lollie Sails MD  Primary Physician: Dr. Thereasa Distance  Reason for visit:  EGD  History of present illness:  Ricky Mercer is a 65 year old male presenting today with complaint of epigastric right and left upper quadrant pain. He does take NSAIDs regularly. States that some of this pain radiates straight through to his back. He did have an ultrasound that showed a gallbladder polyp but otherwise negative. He has not apparently had any spine films. He is currently taking Carafate and to help. He does take meloxicam regularly and is also taking Protonix 40 mg daily.    Current facility-administered medications:  .  0.9 %  sodium chloride infusion, , Intravenous, Continuous, Lollie Sails, MD, Last Rate: 20 mL/hr at 10/28/15 1313, 1,000 mL at 10/28/15 1313 .  0.9 %  sodium chloride infusion, , Intravenous, Continuous, Lollie Sails, MD  Prescriptions prior to admission  Medication Sig Dispense Refill Last Dose  . sucralfate (CARAFATE) 1 g tablet Take 1 g by mouth 4 (four) times daily -  with meals and at bedtime.     Marland Kitchen aspirin 81 MG tablet Take 81 mg by mouth daily.   Taking  . bisacodyl (DULCOLAX) 5 MG EC tablet Take 2 tablets (10 mg total) by mouth daily as needed for moderate constipation. 30 tablet PRN   . citalopram (CELEXA) 10 MG tablet Take 10 mg by mouth daily.   Taking  . diphenhydramine-acetaminophen (TYLENOL PM EXTRA STRENGTH) 25-500 MG TABS tablet Take 1 tablet by mouth at bedtime as needed.   Taking  . docusate sodium (COLACE) 100 MG capsule Take 1 capsule (100 mg total) by mouth 2 (two) times daily. Do not use longer than 7 days. 120 capsule PRN   . HYDROcodone-acetaminophen (NORCO) 10-325 MG tablet Take 1 tablet by mouth every 6 (six) hours as needed for moderate pain or severe pain. 120 tablet 0   . HYDROcodone-acetaminophen (NORCO) 10-325 MG tablet Take 1 tablet by mouth every 6 (six) hours as needed for moderate  pain or severe pain. 120 tablet 0   . HYDROcodone-acetaminophen (NORCO) 10-325 MG tablet Take 1 tablet by mouth every 6 (six) hours as needed for moderate pain or severe pain. 120 tablet 0   . meloxicam (MOBIC) 15 MG tablet Take 15 mg by mouth daily as needed for pain.   Taking  . pantoprazole (PROTONIX) 40 MG tablet Take 40 mg by mouth daily.   Taking  . phenazopyridine (PYRIDIUM) 200 MG tablet Take 200 mg by mouth every 8 (eight) hours as needed for pain.   Taking  . simvastatin (ZOCOR) 10 MG tablet Take 10 mg by mouth daily.   Taking  . Testosterone (ANDROGEL) 20.25 MG/1.25GM (1.62%) GEL Place 1 Squirt onto the skin 3 (three) times daily.   Taking  . Wheat Dextrin (BENEFIBER) POWD Stir 2 tsp. TID into 4-8 oz of any non-carbonated beverage or soft food (hot or cold) 500 g PRN      No Known Allergies   Past Medical History  Diagnosis Date  . Hyperlipidemia   . GERD (gastroesophageal reflux disease)   . Prostate cancer ([place])   . Seasonal allergies   . Situational anxiety   . Diverticulosis   . Dupuytren's contracture   . Knee pain   . Midline low back pain   . Pain in neck     hurts to turn side to side  . Foot pain, bilateral   . Bilateral arm  pain   . Hand weakness   . Osteoarthritis     neck and back  . Sleep apnea     CPAP - only uses "sometimes"    Review of systems:      Physical Exam    Heart and lungs: Regular rate and rhythm without rub or gallop, lungs are bilaterally clear.    HEENT: Normocephalic atraumatic eyes are anicteric    Other:     Pertinant exam for procedure: Soft positive tender to palpation entire abdomen mostly in the upper half. No masses or rebound. Bowel sounds are positive normoactive.    Planned proceedures: EGD and indicated procedures. I have discussed the risks benefits and complications of procedures to include not limited to bleeding, infection, perforation and the risk of sedation and the patient wishes to proceed.    Lollie Sails, MD Gastroenterology 10/28/2015  2:19 PM

## 2015-10-29 ENCOUNTER — Encounter: Payer: Self-pay | Admitting: Gastroenterology

## 2015-10-30 LAB — SURGICAL PATHOLOGY

## 2015-11-03 ENCOUNTER — Other Ambulatory Visit: Payer: Self-pay | Admitting: Gastroenterology

## 2015-11-03 ENCOUNTER — Ambulatory Visit
Admission: RE | Admit: 2015-11-03 | Discharge: 2015-11-03 | Disposition: A | Discharge status: Home / Self Care | Payer: BLUE CROSS/BLUE SHIELD | Source: Ambulatory Visit | Attending: Gastroenterology | Admitting: Gastroenterology

## 2015-11-03 DIAGNOSIS — M546 Pain in thoracic spine: Secondary | ICD-10-CM

## 2015-11-03 DIAGNOSIS — M47814 Spondylosis without myelopathy or radiculopathy, thoracic region: Secondary | ICD-10-CM | POA: Diagnosis not present

## 2015-11-06 DIAGNOSIS — I428 Other cardiomyopathies: Secondary | ICD-10-CM | POA: Insufficient documentation

## 2015-11-06 DIAGNOSIS — R079 Chest pain, unspecified: Secondary | ICD-10-CM | POA: Insufficient documentation

## 2015-11-06 DIAGNOSIS — I429 Cardiomyopathy, unspecified: Secondary | ICD-10-CM | POA: Insufficient documentation

## 2015-11-17 ENCOUNTER — Encounter: Payer: Self-pay | Admitting: Pain Medicine

## 2015-11-17 ENCOUNTER — Ambulatory Visit: Payer: BLUE CROSS/BLUE SHIELD | Attending: Pain Medicine | Admitting: Pain Medicine

## 2015-11-17 VITALS — BP 139/92 | HR 91 | Temp 97.7°F | Resp 17 | Ht 69.0 in | Wt 196.0 lb

## 2015-11-17 DIAGNOSIS — Z8546 Personal history of malignant neoplasm of prostate: Secondary | ICD-10-CM | POA: Insufficient documentation

## 2015-11-17 DIAGNOSIS — I429 Cardiomyopathy, unspecified: Secondary | ICD-10-CM | POA: Diagnosis not present

## 2015-11-17 DIAGNOSIS — M25512 Pain in left shoulder: Secondary | ICD-10-CM | POA: Diagnosis not present

## 2015-11-17 DIAGNOSIS — M79606 Pain in leg, unspecified: Secondary | ICD-10-CM | POA: Diagnosis not present

## 2015-11-17 DIAGNOSIS — M542 Cervicalgia: Secondary | ICD-10-CM | POA: Insufficient documentation

## 2015-11-17 DIAGNOSIS — M25511 Pain in right shoulder: Secondary | ICD-10-CM | POA: Diagnosis not present

## 2015-11-17 DIAGNOSIS — M25559 Pain in unspecified hip: Secondary | ICD-10-CM | POA: Insufficient documentation

## 2015-11-17 DIAGNOSIS — Z79891 Long term (current) use of opiate analgesic: Secondary | ICD-10-CM | POA: Diagnosis not present

## 2015-11-17 DIAGNOSIS — M4802 Spinal stenosis, cervical region: Secondary | ICD-10-CM | POA: Diagnosis not present

## 2015-11-17 DIAGNOSIS — Z5181 Encounter for therapeutic drug level monitoring: Secondary | ICD-10-CM | POA: Diagnosis not present

## 2015-11-17 DIAGNOSIS — M25561 Pain in right knee: Secondary | ICD-10-CM | POA: Diagnosis not present

## 2015-11-17 DIAGNOSIS — E7800 Pure hypercholesterolemia, unspecified: Secondary | ICD-10-CM | POA: Diagnosis not present

## 2015-11-17 DIAGNOSIS — Z9889 Other specified postprocedural states: Secondary | ICD-10-CM | POA: Insufficient documentation

## 2015-11-17 DIAGNOSIS — M545 Low back pain: Secondary | ICD-10-CM | POA: Insufficient documentation

## 2015-11-17 DIAGNOSIS — M546 Pain in thoracic spine: Secondary | ICD-10-CM | POA: Insufficient documentation

## 2015-11-17 DIAGNOSIS — R109 Unspecified abdominal pain: Secondary | ICD-10-CM | POA: Diagnosis not present

## 2015-11-17 DIAGNOSIS — R531 Weakness: Secondary | ICD-10-CM | POA: Insufficient documentation

## 2015-11-17 DIAGNOSIS — G8929 Other chronic pain: Secondary | ICD-10-CM | POA: Insufficient documentation

## 2015-11-17 DIAGNOSIS — G629 Polyneuropathy, unspecified: Secondary | ICD-10-CM | POA: Insufficient documentation

## 2015-11-17 DIAGNOSIS — M549 Dorsalgia, unspecified: Secondary | ICD-10-CM | POA: Diagnosis present

## 2015-11-17 MED ORDER — OXYCODONE HCL 10 MG PO TABS: 10.0000 mg | ORAL_TABLET | Freq: Three times a day (TID) | ORAL | Status: DC | PRN

## 2015-11-17 NOTE — Progress Notes (Signed)
Patient's Name: Ricky Mercer MRN: [id number] DOB: 1951-01-25 DOS: 11/17/2015  Primary Reason(s) for Visit: Encounter for Medication Management CC: Back Pain and Neck Pain   HPI  [name] is a 65 y.o. year old, male patient, who returns today as an established patient. He has Hand weakness; Polyradiculopathy; Chronic pain; Long term current use of opiate analgesic; Long term prescription opiate use; Opiate use; Opiate dependence ([place]); Encounter for therapeutic drug level monitoring; Chronic low back pain  (Location of Primary Pain) (Bilateral) (L>R); Chronic lower extremity pain (Location of Tertiary source of pain) (Bilateral) (L>R); Chronic lumbar radicular pain (Bilateral) (L>R) (Bilateral S1 dermatomal distribution); Chronic hip pain (Location of Secondary source of pain) (Bilateral) (L>R); Chronic upper back pain (Bilateral) (L>R); Chronic neck pain (neck<shoulder) (L>R); Chronic foot pain; Chronic knee pain (Right); Cervical spondylosis; 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); Chronic upper extremity pain (Bilateral) (L>R); Lumbar spondylosis; Cervical facet hypertrophy; Cervical facet syndrome; History of prostate cancer; Radicular pain of shoulder; Class I Morbid obesity (HCC) (68% higher incidence of chronic low back problems); Peripheral neuropathy (HCC) (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); Lumbar facet syndrome; Sleep apnea; Depression; High cholesterol; Low testosterone; History of surgical procedure; Therapeutic opioid-induced constipation (OIC); Therapeutic opioid induced constipation; Chronic shoulder pain (Bilateral) (L>R); Breathing orally; Chest pain; and Primary cardiomyopathy ([place]) on his problem list.. His primarily concern today is the Back Pain and Neck Pain   The patient comes in today clinics today for pharmacological management of his chronic pain. He is concerned about his medicines causing abdominal pain. However, he is having  problems with his gallbladder and he also takes meloxicam. I have instructed him to discontinue the use of the meloxicam and and to have his gallbladder evaluated for possible cholecystectomy. In any case, to decrease the chances of the medication given him any problems long-term, we will discontinue the hydrocodone/APAP and start him on oxycodone IR instead.  Pain Assessment: Self-Reported Pain Score: 6 , clinically he looks like a 1-2/10. Reported level is inconsistent with clinical obrservations Pain Type: Chronic pain Pain Location: Back Pain Orientation: Mid Pain Descriptors / Indicators: Aching Pain Frequency: Constant  Date of Last Visit: 08/20/15 Service Provided on Last Visit: Med Refill  Controlled Substance Pharmacotherapy Assessment  Analgesic: Hydrocodone/APAP 10/325 one tablet by mouth every 6 hours (40 mg/day) MME/day: 40 mg/day Pharmacokinetics: Onset of action (Liberation/Absorption): Within expected pharmacological parameters. (30 minutes) Time to Peak effect (Distribution): Timing and results are as within normal expected parameters. (2 hours) Duration of action (Metabolism/Excretion): Within normal limits for medication. (4-5 hours) Pharmacodynamics: Analgesic Effect: 70% Activity Facilitation: Medication(s) allow patient to sit, stand, walk, and do the basic ADLs Perceived Effectiveness: Described as relatively effective, allowing for increase in activities of daily living (ADL) Side-effects or Adverse reactions: None reported Monitoring: [place] PMP: Compliant with practice rules and regulations UDS Results/interpretation: The patient's last UDS was done on 07/23/2015 and it came back within normal limits with no unexpected results. Medication Assessment Form: Reviewed. Patient indicates being compliant with therapy Treatment compliance: Compliant Risk Assessment: Aberrant Behavior: None observed today Substance Use Disorder (SUD) Risk Level: Low Opioid Risk Tool  (ORT) Score: Total Score: 3 Low Risk for SUD (Score <3) Depression Scale Score: PHQ-2: PHQ-2 Total Score: 0 PHQ-9: PHQ-9 Total Score: 0 Pharmacologic Plan: No change in therapy, at this time  Laboratory Workup  Last ED UDS: No results found for: THCU, COCAINSCRNUR, PCPSCRNUR, MDMA, AMPHETMU, METHADONE, ETOH  Inflammation Markers Lab Results  Component  Value Date   ESRSEDRATE 4 08/21/2015   CRP <0.5 08/21/2015    Renal Function Lab Results  Component Value Date   BUN 13 08/21/2015   CREATININE 1.07 08/21/2015   GFRAA >60 08/21/2015   GFRNONAA >60 08/21/2015    Hepatic Function Lab Results  Component Value Date   AST 23 08/21/2015   ALT 26 08/21/2015   ALBUMIN 4.1 08/21/2015    Electrolytes Lab Results  Component Value Date   NA 138 08/21/2015   K 4.0 08/21/2015   CL 108 08/21/2015   CALCIUM 9.2 08/21/2015   MG 2.1 08/21/2015    Allergies  [name] has No Known Allergies.  Meds  The patient has a current medication list which includes the following prescription(s): aspirin, bisacodyl, citalopram, diphenhydramine-acetaminophen, docusate sodium, meloxicam, oxycodone hcl, pantoprazole, phenazopyridine, simvastatin, sucralfate, testosterone, and benefiber.  Current Outpatient Prescriptions on File Prior to Visit  Medication Sig  . aspirin 81 MG tablet Take 81 mg by mouth daily.  . bisacodyl (DULCOLAX) 5 MG EC tablet Take 2 tablets (10 mg total) by mouth daily as needed for moderate constipation.  . citalopram (CELEXA) 10 MG tablet Take 10 mg by mouth daily.  . diphenhydramine-acetaminophen (TYLENOL PM EXTRA STRENGTH) 25-500 MG TABS tablet Take 1 tablet by mouth at bedtime as needed.  . docusate sodium (COLACE) 100 MG capsule Take 1 capsule (100 mg total) by mouth 2 (two) times daily. Do not use longer than 7 days. (Patient taking differently: Take 100 mg by mouth 2 (two) times daily as needed. Do not use longer than 7 days.)  . meloxicam (MOBIC) 15 MG tablet  Take 15 mg by mouth daily as needed for pain.  . pantoprazole (PROTONIX) 40 MG tablet Take 40 mg by mouth daily.  . phenazopyridine (PYRIDIUM) 200 MG tablet Take 200 mg by mouth every 8 (eight) hours as needed for pain.  . simvastatin (ZOCOR) 10 MG tablet Take 10 mg by mouth daily.  . sucralfate (CARAFATE) 1 g tablet Take 1 g by mouth 4 (four) times daily -  with meals and at bedtime.  . Testosterone (ANDROGEL) 20.25 MG/1.25GM (1.62%) GEL Place 1 Squirt onto the skin 3 (three) times daily.  . Wheat Dextrin (BENEFIBER) POWD Stir 2 tsp. TID into 4-8 oz of any non-carbonated beverage or soft food (hot or cold)   No current facility-administered medications on file prior to visit.    ROS  Constitutional: Afebrile, no chills, well hydrated and well nourished Gastrointestinal: negative Musculoskeletal:negative Neurological: negative Behavioral/Psych: negative  PFSH  Medical:  [name]  has a past medical history of Hyperlipidemia; GERD (gastroesophageal reflux disease); Prostate cancer ([place]); Seasonal allergies; Situational anxiety; Diverticulosis; Dupuytren's contracture; Knee pain; Midline low back pain; Pain in neck; Foot pain, bilateral; Bilateral arm pain; Hand weakness; Osteoarthritis; and Sleep apnea. Family: family history includes Colon cancer in his brother; Hyperlipidemia in his sister; Lung cancer in his father; Prostate cancer in his brother. Surgical:  has past surgical history that includes Nose surgery; Hand surgery (Bilateral); Colonoscopy (10/24/2012); Upper gi endoscopy (12/18/2012); Tonsillectomy (2009); Skin cancer excision (Left, 2015); Mass excision (Right, 07/18/2015); and Esophagogastroduodenoscopy (egd) with propofol (N/A, 10/28/2015). Tobacco:  reports that he has never smoked. He has never used smokeless tobacco. Alcohol:  reports that he does not drink alcohol. Drug:  reports that he does not use illicit drugs.  Physical Exam  Vitals:  Today's Vitals   11/17/15  0931  BP: 139/92  Pulse: 91  Temp: 97.7 F (36.5  C)  TempSrc: Oral  Resp: 17  Height: 5\' 9"  (1.753 m)  Weight: 196 lb (88.905 kg)  SpO2: 100%  PainSc: 6   PainLoc: Back    Calculated BMI: Body mass index is 28.93 kg/(m^2).  General appearance: alert, cooperative, appears stated age and no distress Eyes: PERLA Respiratory: No evidence respiratory distress, no audible rales or ronchi and no use of accessory muscles of respiration  Cervical Spine Inspection: Normal anatomy Alignment: Symetrical ROM: Decreased  Upper Extremities Inspection: No gross anomalies detected ROM: Adequate Sensory: Normal Motor: Unremarkable  Thoracic Spine Inspection: No gross anomalies detected Alignment: Symetrical ROM: Adequate  Lumbar Spine Inspection: No gross anomalies detected Alignment: Symetrical ROM: Decreased  Gait: Antalgic (limping)  Lower Extremities Inspection: No gross anomalies detected ROM: Decreased Sensory:  Normal Motor: Unremarkable  Assessment & Plan  Primary Diagnosis & Pertinent Problem List: The primary encounter diagnosis was Chronic pain. Diagnoses of Encounter for therapeutic drug level monitoring, Long term current use of opiate analgesic, Chronic low back pain  (Location of Primary Pain) (Bilateral) (L>R), Chronic hip pain, unspecified laterality, Chronic pain of lower extremity, unspecified laterality, and Chronic neck pain (neck<shoulder) (L>R) were also pertinent to this visit.  Visit Diagnosis: 1. Chronic pain   2. Encounter for therapeutic drug level monitoring   3. Long term current use of opiate analgesic   4. Chronic low back pain  (Location of Primary Pain) (Bilateral) (L>R)   5. Chronic hip pain, unspecified laterality   6. Chronic pain of lower extremity, unspecified laterality   7. Chronic neck pain (neck<shoulder) (L>R)     Problem-specific Plan(s): No problem-specific assessment & plan notes found for this encounter.   Plan of Care   Pharmacotherapy (Medications Ordered): Meds ordered this encounter  Medications  . Oxycodone HCl 10 MG TABS    Sig: Take 1 tablet (10 mg total) by mouth every 8 (eight) hours as needed.    Dispense:  90 tablet    Refill:  0    Do not place this medication, or any other prescription from our practice, on "Automatic Refill". Patient may have prescription filled one day early if pharmacy is closed on scheduled refill date. Do not fill until: 11/17/15 To last until: 12/17/15    [facility] & Procedure Ordered: Orders Placed This Encounter  Procedures  . CERVICAL EPIDURAL STEROID INJECTION    Standing Status: Standing     Number of Occurrences: 1     Standing Expiration Date: 11/16/2016    Scheduling Instructions:     Side: Left-sided     Sedation: With Sedation.     Timeframe: PRN Procedure. Patient will call to schedule.     Indication: Neck pain with or without upper extremity pain, numbness, or weakness. Cervical Spondylosis with or without cervical radicular pain.    Order Specific Question:  Where will this procedure be performed?    Answer:  [facility]  . LUMBAR FACET(MEDIAL BRANCH NERVE BLOCK) MBNB    Standing Status: Standing     Number of Occurrences: 1     Standing Expiration Date: 11/16/2016    Scheduling Instructions:     Side: Bilateral     Level: L2, L3, L4, L5, & S1 Medial Branch Nerve     Sedation: With Sedation.     Timeframe: PRN Procedure. Patient will call to schedule.    Order Specific Question:  Where will this procedure be performed?    Answer:  [facility]  . LUMBAR EPIDURAL STEROID  INJECTION    Standing Status: Standing     Number of Occurrences: 1     Standing Expiration Date: 11/16/2016    Scheduling Instructions:     Side: Midline (caudal)     Sedation: With Sedation.     Timeframe: PRN Procedure. Patient will call to schedule.    Order Specific Question:  Where will this procedure be performed?    Answer:  [facility]  . HIP  INJECTION    Standing Status: Standing     Number of Occurrences: 1     Standing Expiration Date: 11/16/2016    Scheduling Instructions:     Side: Bilateral     Sedation: With Sedation.     Timeframe: PRN Procedure. Patient will call.  Raelene Bott Select 13 (MW), Urine    Volume: 30 ml(s). Minimum 3 ml of urine is needed. Document temperature of fresh sample. Indications: Long term (current) use of opiate analgesic [id number])    Imaging Ordered: None  Interventional Therapies: Scheduled: None at this time. PRN Procedures:  1. For the back pain, bilateral lumbar facet block under fluoroscopic guidance and IV sedation.  2. For the hip pain, bilateral, diagnostic, intra-articular hip injection under fluoroscopic guidance and IV sedation.  3. For the lower extremity pain, caudal epidural steroid injection under fluoroscopic guidance and IV sedation.  4. For the neck pain, left cervical epidural steroid injection fluoroscopic guidance and IV sedation.    Referral(s) or Consult(s): None at this time.  Medications administered during this visit: Mr. Gabourel had no medications administered during this visit.  Future Appointments Date Time Provider Blessing  12/17/2015 11:00 AM Ricky Pointer, MD [facility] None    Primary Care Physician: [facility], Chrissie Noa, MD Location: [facility] Note by: Kathlen Brunswick. Dossie Arbour, M.D, DABA, DABAPM, DABPM, DABIPP, FIPP  Pain Score Disclaimer: We use the NRS-11 scale. This is a self-reported, subjective measurement of pain severity with only modest accuracy. It is used primarily to identify changes within a particular patient. It must be understood that outpatient pain scales are significantly less accurate that those used for research, where they can be applied under ideal controlled circumstances with minimal exposure to variables. In reality, the score is likely to be a combination of pain intensity and pain affect,  where pain affect describes the degree of emotional arousal or changes in action readiness caused by the sensory experience of pain. Factors such as social and work situation, setting, emotional state, anxiety levels, expectation, and prior pain experience may influence pain perception and show large inter-individual differences that may also be affected by time variables.

## 2015-11-17 NOTE — Progress Notes (Signed)
09/05/15: CT abdomen for RUQ pain (negative) 10/14/15:  HIDA scan for RUQ pain (normal) 10/28/15:  Endoscopy for RUQ pain (reflux, gastritis, gallbladder polyp) 11/03/15:  X-ray Thoracic spine (negative) Has an appointment with Dr. Jacqulyn Liner to discuss gallbladder (removal?)  Pill count: Hydrocodone 7/120 filled 10/14/15

## 2015-11-21 LAB — TOXASSURE SELECT 13 (MW), URINE: PDF: 0

## 2015-11-25 ENCOUNTER — Encounter
Admission: RE | Admit: 2015-11-25 | Discharge: 2015-11-25 | Disposition: A | Discharge status: Home / Self Care | Payer: BLUE CROSS/BLUE SHIELD | Source: Ambulatory Visit | Attending: Surgery | Admitting: Surgery

## 2015-11-25 DIAGNOSIS — Z01812 Encounter for preprocedural laboratory examination: Secondary | ICD-10-CM | POA: Diagnosis not present

## 2015-11-25 HISTORY — DX: Unspecified asthma, uncomplicated: J45.909

## 2015-11-25 HISTORY — DX: Cardiomyopathy, unspecified: I42.9

## 2015-11-25 LAB — CBC
HEMATOCRIT: 43.5 % (ref 40.0–52.0)
HEMOGLOBIN: 14.6 g/dL (ref 13.0–18.0)
MCH: 29.7 pg (ref 26.0–34.0)
MCHC: 33.6 g/dL (ref 32.0–36.0)
MCV: 88.3 fL (ref 80.0–100.0)
Platelets: 238 10*3/uL (ref 150–440)
RBC: 4.92 MIL/uL (ref 4.40–5.90)
RDW: 12.8 % (ref 11.5–14.5)
WBC: 6.5 10*3/uL (ref 3.8–10.6)

## 2015-11-25 LAB — COMPREHENSIVE METABOLIC PANEL
ALT: 41 U/L (ref 17–63)
AST: 31 U/L (ref 15–41)
Albumin: 3.9 g/dL (ref 3.5–5.0)
Alkaline Phosphatase: 71 U/L (ref 38–126)
Anion gap: 4 — ABNORMAL LOW (ref 5–15)
BUN: 13 mg/dL (ref 6–20)
CHLORIDE: 104 mmol/L (ref 101–111)
CO2: 30 mmol/L (ref 22–32)
Calcium: 9 mg/dL (ref 8.9–10.3)
Creatinine, Ser: 0.96 mg/dL (ref 0.61–1.24)
Glucose, Bld: 103 mg/dL — ABNORMAL HIGH (ref 65–99)
POTASSIUM: 4.2 mmol/L (ref 3.5–5.1)
Sodium: 138 mmol/L (ref 135–145)
Total Bilirubin: 0.6 mg/dL (ref 0.3–1.2)
Total Protein: 7.2 g/dL (ref 6.5–8.1)

## 2015-11-25 LAB — DIFFERENTIAL
BASOS ABS: 0 10*3/uL (ref 0–0.1)
BASOS PCT: 1 %
EOS ABS: 0.3 10*3/uL (ref 0–0.7)
Eosinophils Relative: 4 %
Lymphocytes Relative: 26 %
Lymphs Abs: 1.7 10*3/uL (ref 1.0–3.6)
MONO ABS: 0.5 10*3/uL (ref 0.2–1.0)
MONOS PCT: 8 %
Neutro Abs: 4 10*3/uL (ref 1.4–6.5)
Neutrophils Relative %: 61 %

## 2015-11-25 NOTE — Patient Instructions (Signed)
  Your procedure is scheduled on: Tuesday 12/02/15 Report to Day Surgery. 2ND FLOOR MEDICAL MALL ENTRANCE To find out your arrival time please call [phone] between 1PM - 3PM on Monday 12/01/15 .  Remember: Instructions that are not followed completely may result in serious medical risk, up to and including death, or upon the discretion of your surgeon and anesthesiologist your surgery may need to be rescheduled.    __X__ 1. Do not eat food or drink liquids after midnight. No gum chewing or hard candies.     __X__ 2. No Alcohol for 24 hours before or after surgery.   ____ 3. Bring all medications with you on the day of surgery if instructed.    __X__ 4. Notify your doctor if there is any change in your medical condition     (cold, fever, infections).     Do not wear jewelry, make-up, hairpins, clips or nail polish.  Do not wear lotions, powders, or perfumes.   Do not shave 48 hours prior to surgery. Men may shave face and neck.  Do not bring valuables to the hospital.    [facility] is not responsible for any belongings or valuables.               Contacts, dentures or bridgework may not be worn into surgery.  Leave your suitcase in the car. After surgery it may be brought to your room.  For patients admitted to the hospital, discharge time is determined by your                treatment team.   Patients discharged the day of surgery will not be allowed to drive home.   Please read over the following fact sheets that you were given:   Surgical Site Infection Prevention   __X__ Take these medicines the morning of surgery with A SIP OF WATER:    1. CITALOPRAM  2. PANTOPRAZOLE AT BEDTIME NIGHT BEFORE SURGERY AND AGAIN THE MORNING OF SURGERY  3. OXYCODONE IF NEEDED  4.  5.  6.  ____ Fleet Enema (as directed)   __X__ Use CHG Soap as directed  ____ Use inhalers on the day of surgery  ____ Stop metformin 2 days prior to surgery    ____ Take 1/2 of usual insulin dose the  night before surgery and none on the morning of surgery.   __X__ Stop Coumadin/Plavix/aspirin on 7 DAYS BEFORE SURGERY AS DIRECTED BY DR Tamala Julian  ____ Stop Anti-inflammatories on    ____ Stop supplements until after surgery.    ____ Bring C-Pap to the hospital.

## 2015-11-25 NOTE — Pre-Procedure Instructions (Signed)
ANESTHESIA   NM myocardial perfusion SPECT multiple (stress and rest)11/12/2015  [place]  Result Impression   1.  Negative ETT 2.  Normal left ventricular function 3.  Normal wall motion 4.  No evidence for scar or ischemia   Result Narrative  Procedure: Exercise Myocardial Perfusion Imaging   ONE day procedure  Indication: Chest pain with high risk for cardiac etiology, unspecified -  Plan: NM myocardial perfusion SPECT multiple (stress and rest), ECG stress  test only Ordering Physician:   Dr. Isaias Cowman   Clinical History: 65 y.o. year old male Vitals: Height: 69 in  Weight: 201 lb Cardiac risk factors include:    CARDIOMYOPATHY and Hyperlipidemia    Procedure: The patient performed treadmill exercise using a Bruce protocol for 9:00  minutes. The exercise test was stopped due to SOB/fatigue.  Blood pressure  response was normal.   Rest HR: 86bpm Rest BP: 134/63mmHg Max HR: 139bpm Max BP: 152/43mmHg Mets:     10.10 % MAX HR:   87%  Stress Test Administered by: Oswald Hillock, CMA  ECG Interpretation: Rest ECG:  normal sinus rhythm, none Stress ECG:  normal sinus rhythm,  Recovery ECG:  normal sinus rhythm ECG Interpretation:  negative, no ECG changes.   Administrations This Visit    technetium Tc53m sestamibi (CARDIOLITE) injection [id number] millicurie    Admin Date Action Dose Route Administered By      [id number] Given [id number] millicurie Intravenous Ane Payment, CNMT            technetium Tc85m sestamibi (CARDIOLITE) injection [id number] millicurie    Admin Date Action Dose Route Administered By      [id number] Given [id number] millicurie Intravenous Ane Payment, CNMT              Gated post-stress perfusion imaging was performed 30 minutes after stress.  Rest images were performed 30 minutes after injection.  Gated LV Analysis:  TID:    LVEF= 60%  FINDINGS: Regional wall motion:  reveals normal myocardial  thickening and wall  motion. The overall quality of the study is good.   Artifacts noted: no Left ventricular cavity: normal.  Perfusion Analysis:  SPECT images demonstrate homogeneous tracer  distribution throughout the myocardium.     Status Results Details   Encounter Summary  ECG stress test only3/09/2015  [place]  ECG stress test only3/09/2015  [place]  Component Name Value Range       Status Results Details   Encounter Summary  February EXTERNAL RADIOLOGY RESULT - OTHER2/20/2017  [place]  EXTERNAL RADIOLOGY RESULT - OTHER2/20/2017  [place]  Result Narrative  Ordered by an unspecified provider.   Status Results Details   Encounter Summary  Echocardiogram 2D complete2/17/2017  [place]  Echocardiogram 2D complete2/17/2017  [place]  Component Name Value Range  LV Ejection Fraction (%) 45   Aortic Valve Stenosis Grade none   Aortic Valve Regurgitation Grade trivial   Mitral Valve Stenosis Grade none   Mitral Valve Regurgitation Grade none   Tricuspid Valve Regurgitation Grade none   LV End Diastolic Diameter (cm) 4.4 cm  LV End Systolic Diameter (cm) 3.2 cm  LV Septum Wall Thickness (cm) 1 cm  LV Posterior Wall Thickness (cm) 1.1 cm  Left Atrium Diameter (cm) 3.8 cm   Result Narrative  INTERNAL MEDICINE DEPARTMENT                     [place], [place]                           [id number]                   Funk #: [id number]         [place], [place],  [place] [address]            Date: 10/31/2015 01:12 PM                                                                  Adult Male Age: 65 yrs                     ECHOCARDIOGRAM REPORT                        Outpatient          STUDY:CHEST WALL         TAPE:0000:00: 0:00:00           KC::KCWI           ECHO:Yes   DOPPLER:Yes  FILE:0000-000-000              MD1:  FLEMING, HERBON E          COLOR:Yes  CONTRAST:No       MACHINE:[place]            Height: 69 in      RV BIOPSY:No         3D:No    SOUND QLTY:Moderate           Weight: 201 lb         MEDIUM:None                                                                  BSA: 2.1 m2 ___________________________________________________________________________________________          HISTORY:DOE           REASON:Assess, LV function       INDICATION:DOE (dyspnea on exertion) [R06.09 (ICD-10-CM)]  ___________________________________________________________________________________________ ECHOCARDIOGRAPHIC MEASUREMENTS 2D DIMENSIONS AORTA             Values      Normal Range      MAIN PA          Values      Normal Range           Annulus:  nm*       [2.3 - 2.9]  PA Main:  nm*       [1.5 - 2.1]         Aorta Sin:  nm*       [3.1 - 3.7]       RIGHT VENTRICLE       ST Junction:  nm*       [2.6 - 3.2]                RV Base:  nm*       [ < 4.2]         Asc.Aorta:  nm*       [2.6 - 3.4]                 RV Mid:  nm*       [ < 3.5]  LEFT VENTRICLE                                        RV Length:  nm*       [ < 8.6]             LVIDd:  4.4 cm    [4.2 - 5.9]       INFERIOR VENA CAVA             LVIDs:  3.2 cm                              Max. IVC:  nm*       [ <= 2.1]                FS:  27.3 %    [> 25]                    Min. IVC:  nm*               SWT:  1.0 cm    [0.6 - 1.0]                   ------------------               PWT:  1.1 cm    [0.6 - 1.0]                   nm* - not measured  LEFT ATRIUM           LA Diam:  3.8 cm    [3.0 - 4.0]       LA A4C Area:  nm*       [ < 20]         LA Volume:  nm*       [18 - 58]  ___________________________________________________________________________________________ ECHOCARDIOGRAPHIC DESCRIPTIONS  AORTIC ROOT         Size:Normal   Dissection:INDETERM FOR  DISSECTION  AORTIC VALVE     Leaflets:Tricuspid            Morphology:Normal     Mobility:Fully mobile  LEFT VENTRICLE         Size:Normal                 Anterior:Normal  Contraction:REGIONALLY IMPAIRED     Lateral:Normal   Closest EF:45% (Estimated)          Septal:HYPOCONTRACTILE    LV Masses:No Masses  Apical:Normal          OM:[id number]                   Inferior:HYPOCONTRACTILE                                    Posterior:Normal Dias.FxClass:(Grade 1) relaxation abnormal, E/A reversal  MITRAL VALVE     Leaflets:Normal                 Mobility:Fully mobile   Morphology:Normal  LEFT ATRIUM         Size:Normal                LA Masses:No masses    IA Septum:Normal IAS  MAIN PA         Size:Normal  PULMONIC VALVE   Morphology:Normal                 Mobility:Fully mobile  RIGHT VENTRICLE    RV Masses:No Masses                  Size:Normal    Free Wall:Normal              Contraction:Normal  TRICUSPID VALVE     Leaflets:Normal                 Mobility:Fully mobile   Morphology:Normal  RIGHT ATRIUM         Size:Normal                 RA Other:None      RA Mass:No masses  PERICARDIUM        Fluid:No effusion  INFERIOR VENACAVA         Size:Normal Normal respiratory collapse   ____________________________________________________________________ DOPPLER ECHO and OTHER SPECIAL PROCEDURES    Aortic:TRIVIAL AR                    No AS     Mitral:No MR                         No MS           MV Inflow E Vel=58.6 cm/sec   MV Annulus E'Vel=7.0 cm/sec           E/E'Ratio=8.3  Tricuspid:No TR                         No TS  Pulmonary:No PR                         No PS     ___________________________________________________________________________________________ INTERPRETATION MILD LV SYSTOLIC DYSFUNCTION (See above) NORMAL RIGHT VENTRICULAR SYSTOLIC FUNCTION MILD VALVULAR REGURGITATION (See above) NO VALVULAR STENOSIS Focal inferoseptal  hypokinesis   ___________________________________________________________________________________________ Electronically signed by: Rusty Aus, MD on 11/03/2015 07:43 AM             Performed By: Scherrie November, RCS       Ordering Physician: Wallene Huh  ___________________________________________________________________________________________   Status Results Details   Encounter Summary

## 2015-12-02 ENCOUNTER — Encounter: Payer: Self-pay | Admitting: *Deleted

## 2015-12-02 ENCOUNTER — Ambulatory Visit
Admission: RE | Admit: 2015-12-02 | Discharge: 2015-12-02 | Disposition: A | Discharge status: Home / Self Care | Payer: BLUE CROSS/BLUE SHIELD | Source: Ambulatory Visit | Attending: Surgery | Admitting: Surgery

## 2015-12-02 ENCOUNTER — Encounter
Admission: RE | Disposition: A | Discharge status: Home / Self Care | Payer: Self-pay | Source: Ambulatory Visit | Attending: Surgery

## 2015-12-02 ENCOUNTER — Ambulatory Visit: Payer: BLUE CROSS/BLUE SHIELD | Admitting: Anesthesiology

## 2015-12-02 ENCOUNTER — Ambulatory Visit: Payer: BLUE CROSS/BLUE SHIELD

## 2015-12-02 DIAGNOSIS — K8050 Calculus of bile duct without cholangitis or cholecystitis without obstruction: Secondary | ICD-10-CM

## 2015-12-02 DIAGNOSIS — Z7982 Long term (current) use of aspirin: Secondary | ICD-10-CM | POA: Insufficient documentation

## 2015-12-02 DIAGNOSIS — E785 Hyperlipidemia, unspecified: Secondary | ICD-10-CM | POA: Insufficient documentation

## 2015-12-02 DIAGNOSIS — F418 Other specified anxiety disorders: Secondary | ICD-10-CM | POA: Insufficient documentation

## 2015-12-02 DIAGNOSIS — G4730 Sleep apnea, unspecified: Secondary | ICD-10-CM | POA: Diagnosis not present

## 2015-12-02 DIAGNOSIS — J45909 Unspecified asthma, uncomplicated: Secondary | ICD-10-CM | POA: Insufficient documentation

## 2015-12-02 DIAGNOSIS — Z85828 Personal history of other malignant neoplasm of skin: Secondary | ICD-10-CM | POA: Diagnosis not present

## 2015-12-02 DIAGNOSIS — K219 Gastro-esophageal reflux disease without esophagitis: Secondary | ICD-10-CM | POA: Insufficient documentation

## 2015-12-02 DIAGNOSIS — M1990 Unspecified osteoarthritis, unspecified site: Secondary | ICD-10-CM | POA: Insufficient documentation

## 2015-12-02 DIAGNOSIS — Z79899 Other long term (current) drug therapy: Secondary | ICD-10-CM | POA: Insufficient documentation

## 2015-12-02 DIAGNOSIS — K811 Chronic cholecystitis: Secondary | ICD-10-CM | POA: Diagnosis not present

## 2015-12-02 DIAGNOSIS — Z8546 Personal history of malignant neoplasm of prostate: Secondary | ICD-10-CM | POA: Diagnosis not present

## 2015-12-02 DIAGNOSIS — G709 Myoneural disorder, unspecified: Secondary | ICD-10-CM | POA: Diagnosis not present

## 2015-12-02 HISTORY — PX: CHOLECYSTECTOMY: SHX55

## 2015-12-02 SURGERY — LAPAROSCOPIC CHOLECYSTECTOMY
Anesthesia: General | Site: Abdomen | Wound class: Clean

## 2015-12-02 MED ORDER — ONDANSETRON HCL 4 MG/2ML IJ SOLN: 4.0000 mg | Freq: Once | INTRAMUSCULAR | Status: DC | PRN

## 2015-12-02 MED ORDER — OXYCODONE-ACETAMINOPHEN 5-325 MG PO TABS: 2.0000 | ORAL_TABLET | ORAL | Status: DC | PRN

## 2015-12-02 MED ORDER — ONDANSETRON HCL 4 MG/2ML IJ SOLN
INTRAMUSCULAR | Status: DC | PRN
  Administered 2015-12-02: 4 mg via INTRAVENOUS

## 2015-12-02 MED ORDER — FENTANYL CITRATE (PF) 100 MCG/2ML IJ SOLN
25.0000 ug | INTRAMUSCULAR | Status: AC | PRN
  Administered 2015-12-02 (×6): 25 ug via INTRAVENOUS

## 2015-12-02 MED ORDER — ACETAMINOPHEN 10 MG/ML IV SOLN
INTRAVENOUS | Status: AC
  Filled 2015-12-02: qty 100

## 2015-12-02 MED ORDER — ROCURONIUM BROMIDE 100 MG/10ML IV SOLN
INTRAVENOUS | Status: DC | PRN
  Administered 2015-12-02: 10 mg via INTRAVENOUS
  Administered 2015-12-02: 30 mg via INTRAVENOUS
  Administered 2015-12-02: 10 mg via INTRAVENOUS

## 2015-12-02 MED ORDER — HEPARIN SODIUM (PORCINE) 5000 UNIT/ML IJ SOLN
INTRAMUSCULAR | Status: AC
  Filled 2015-12-02: qty 1

## 2015-12-02 MED ORDER — MIDAZOLAM HCL 5 MG/5ML IJ SOLN
INTRAMUSCULAR | Status: DC | PRN
  Administered 2015-12-02: 2 mg via INTRAVENOUS

## 2015-12-02 MED ORDER — SUGAMMADEX SODIUM 500 MG/5ML IV SOLN
INTRAVENOUS | Status: DC | PRN
  Administered 2015-12-02: 181.4 mg via INTRAVENOUS

## 2015-12-02 MED ORDER — LACTATED RINGERS IV SOLN
INTRAVENOUS | Status: DC
  Administered 2015-12-02 (×2): via INTRAVENOUS

## 2015-12-02 MED ORDER — SUCCINYLCHOLINE CHLORIDE 20 MG/ML IJ SOLN
INTRAMUSCULAR | Status: DC | PRN
  Administered 2015-12-02: 100 mg via INTRAVENOUS

## 2015-12-02 MED ORDER — PROPOFOL 10 MG/ML IV BOLUS
INTRAVENOUS | Status: DC | PRN
  Administered 2015-12-02: 200 mg via INTRAVENOUS

## 2015-12-02 MED ORDER — FENTANYL CITRATE (PF) 250 MCG/5ML IJ SOLN
INTRAMUSCULAR | Status: DC | PRN
  Administered 2015-12-02: 50 ug via INTRAVENOUS

## 2015-12-02 MED ORDER — FENTANYL CITRATE (PF) 100 MCG/2ML IJ SOLN
INTRAMUSCULAR | Status: AC
  Administered 2015-12-02: 25 ug via INTRAVENOUS
  Filled 2015-12-02: qty 2

## 2015-12-02 MED ORDER — EPHEDRINE SULFATE 50 MG/ML IJ SOLN
INTRAMUSCULAR | Status: DC | PRN
  Administered 2015-12-02: 10 mg via INTRAVENOUS

## 2015-12-02 MED ORDER — ACETAMINOPHEN 10 MG/ML IV SOLN
INTRAVENOUS | Status: DC | PRN
  Administered 2015-12-02: 1000 mg via INTRAVENOUS

## 2015-12-02 MED ORDER — LIDOCAINE HCL (CARDIAC) 20 MG/ML IV SOLN
INTRAVENOUS | Status: DC | PRN
  Administered 2015-12-02: 100 mg via INTRAVENOUS

## 2015-12-02 SURGICAL SUPPLY — 38 items
APPLIER CLIP ROT 10 11.4 M/L (STAPLE) ×3
BENZOIN TINCTURE PRP APPL 2/3 (GAUZE/BANDAGES/DRESSINGS) ×3 IMPLANT
CANISTER SUCT 1200ML W/VALVE (MISCELLANEOUS) ×3 IMPLANT
CANNULA DILATOR 10 W/SLV (CANNULA) ×2 IMPLANT
CANNULA DILATOR 10MM W/SLV (CANNULA) ×1
CATH REDDICK CHOLANGI 4FR 50CM (CATHETERS) ×3 IMPLANT
CHLORAPREP W/TINT 26ML (MISCELLANEOUS) ×3 IMPLANT
CLIP APPLIE ROT 10 11.4 M/L (STAPLE) ×1 IMPLANT
CLOSURE WOUND 1/2 X4 (GAUZE/BANDAGES/DRESSINGS) ×1
DRAPE SHEET LG 3/4 BI-LAMINATE (DRAPES) ×3 IMPLANT
ELECT REM PT RETURN 9FT ADLT (ELECTROSURGICAL) ×3
ELECTRODE REM PT RTRN 9FT ADLT (ELECTROSURGICAL) ×1 IMPLANT
GAUZE SPONGE 4X4 12PLY STRL (GAUZE/BANDAGES/DRESSINGS) ×3 IMPLANT
GLOVE BIO SURGEON STRL SZ7.5 (GLOVE) ×3 IMPLANT
GOWN STRL REUS W/ TWL LRG LVL3 (GOWN DISPOSABLE) ×4 IMPLANT
GOWN STRL REUS W/TWL LRG LVL3 (GOWN DISPOSABLE) ×8
IRRIGATION STRYKERFLOW (MISCELLANEOUS) ×1 IMPLANT
IRRIGATOR STRYKERFLOW (MISCELLANEOUS) ×3
IV NS 1000ML (IV SOLUTION) ×2
IV NS 1000ML BAXH (IV SOLUTION) ×1 IMPLANT
KIT RM TURNOVER STRD PROC AR (KITS) ×3 IMPLANT
LABEL OR SOLS (LABEL) ×3 IMPLANT
NDL INSUFF ACCESS 14 VERSASTEP (NEEDLE) ×3 IMPLANT
NEEDLE FILTER BLUNT 18X 1/2SAF (NEEDLE) ×2
NEEDLE FILTER BLUNT 18X1 1/2 (NEEDLE) ×1 IMPLANT
NS IRRIG 500ML POUR BTL (IV SOLUTION) ×3 IMPLANT
PACK LAP CHOLECYSTECTOMY (MISCELLANEOUS) ×3 IMPLANT
SCISSORS METZENBAUM CVD 33 (INSTRUMENTS) ×3 IMPLANT
SEAL FOR SCOPE WARMER C3101 (MISCELLANEOUS) ×3 IMPLANT
SLEEVE ENDOPATH XCEL 5M (ENDOMECHANICALS) ×3 IMPLANT
STRIP CLOSURE SKIN 1/2X4 (GAUZE/BANDAGES/DRESSINGS) ×2 IMPLANT
SUT CHROMIC 5 0 RB 1 27 (SUTURE) ×3 IMPLANT
SUT VIC AB 0 CT2 27 (SUTURE) ×3 IMPLANT
SYR 3ML LL SCALE MARK (SYRINGE) ×3 IMPLANT
TROCAR XCEL NON-BLD 11X100MML (ENDOMECHANICALS) ×3 IMPLANT
TROCAR XCEL NON-BLD 5MMX100MML (ENDOMECHANICALS) ×3 IMPLANT
TUBING INSUFFLATOR HI FLOW (MISCELLANEOUS) ×3 IMPLANT
WATER STERILE IRR 1000ML POUR (IV SOLUTION) ×3 IMPLANT

## 2015-12-02 NOTE — Transfer of Care (Signed)
Immediate Anesthesia Transfer of Care Note  Patient: Ricky Mercer  Procedure(s) Performed: Procedure(s): LAPAROSCOPIC CHOLECYSTECTOMY (N/A)  Patient Location: PACU  Anesthesia Type:General  Level of Consciousness: awake  Airway & Oxygen Therapy: Patient Spontanous Breathing and Patient connected to face mask oxygen  Post-op Assessment: Report given to RN and Post -op Vital signs reviewed and stable  Post vital signs: stable  Last Vitals:  Filed Vitals:   12/02/15 0827 12/02/15 1137  BP: 123/83 138/84  Pulse: 79 87  Temp: 37.6 C 36.9 C  Resp: 16 13    Complications: No apparent anesthesia complications

## 2015-12-02 NOTE — Anesthesia Procedure Notes (Signed)
Procedure Name: Intubation Date/Time: 12/02/2015 10:11 AM Performed by: Delaney Meigs Pre-anesthesia Checklist: Patient identified, Emergency Drugs available, Suction available, Patient being monitored and Timeout performed Patient Re-evaluated:Patient Re-evaluated prior to inductionOxygen Delivery Method: Circle system utilized Preoxygenation: Pre-oxygenation with 100% oxygen Intubation Type: IV induction Ventilation: Mask ventilation without difficulty Laryngoscope Size: McGraph and 4 Grade View: Grade II Tube type: Oral Tube size: 7.5 mm Number of attempts: 1 Airway Equipment and Method: Stylet Placement Confirmation: ETT inserted through vocal cords under direct vision,  positive ETCO2 and breath sounds checked- equal and bilateral Secured at: 21 cm Tube secured with: Tape Dental Injury: Teeth and Oropharynx as per pre-operative assessment  Difficulty Due To: Difficulty was anticipated and Difficult Airway- due to anterior larynx

## 2015-12-02 NOTE — Discharge Instructions (Signed)
Take oxycodone 1 each 6 hours if needed for pain.  Resume aspirin on Thursday.  Remove dressings on Wednesday. May shower Thursday.  Avoid straining and heavy lifting for the first week after surgery.   AMBULATORY SURGERY  DISCHARGE INSTRUCTIONS   1) The drugs that you were given will stay in your system until tomorrow so for the next 24 hours you should not:  A) Drive an automobile B) Make any legal decisions C) Drink any alcoholic beverage   2) You may resume regular meals tomorrow.  Today it is better to start with liquids and gradually work up to solid foods.  You may eat anything you prefer, but it is better to start with liquids, then soup and crackers, and gradually work up to solid foods.   3) Please notify your doctor immediately if you have any unusual bleeding, trouble breathing, redness and pain at the surgery site, drainage, fever, or pain not relieved by medication.    4) Additional Instructions:        Please contact your physician with any problems or Same Day Surgery at [phone], Monday through Friday 6 am to 4 pm, or [place] at [facility] number at [phone].

## 2015-12-02 NOTE — H&P (Signed)
  He reports no change in condition since the day of the office visit. He has continued to have intermittent right upper quadrant abdominal pains. We discussed plan for laparoscopic cholecystectomy.

## 2015-12-02 NOTE — Anesthesia Preprocedure Evaluation (Signed)
Anesthesia Evaluation  Patient identified by MRN, date of birth, ID band Patient awake    Reviewed: Allergy & Precautions, H&P , NPO status , Patient's Chart, lab work & pertinent test results, reviewed documented beta blocker date and time   Airway Mallampati: III  TM Distance: >3 FB Neck ROM: full    Dental  (+) Teeth Intact, Chipped,    Pulmonary neg pulmonary ROS, sleep apnea ,    Pulmonary exam normal        Cardiovascular Exercise Tolerance: Good negative cardio ROS Normal cardiovascular exam Rhythm:regular Rate:Normal     Neuro/Psych PSYCHIATRIC DISORDERS  Neuromuscular disease negative neurological ROS  negative psych ROS   GI/Hepatic negative GI ROS, Neg liver ROS, GERD  ,  Endo/Other  negative endocrine ROS  Renal/GU negative Renal ROS  negative genitourinary   Musculoskeletal   Abdominal   Peds  Hematology negative hematology ROS (+)   Anesthesia Other Findings Past Medical History:   Hyperlipidemia                                               GERD (gastroesophageal reflux disease)                       Prostate cancer (HCC)                                        Seasonal allergies                                           Situational anxiety                                          Diverticulosis                                               Dupuytren's contracture                                      Knee pain                                                    Midline low back pain                                        Pain in neck  Comment:hurts to turn side to side   Foot pain, bilateral                                         Bilateral arm pain                                           Hand weakness                                                Osteoarthritis                                                 Comment:neck and back   Sleep apnea                                                     Comment:CPAP - only uses "sometimes"   Cardiomyopathy ([place])                                         Reactive airway disease                                    Past Surgical History:   NOSE SURGERY                                                  HAND SURGERY                                    Bilateral              COLONOSCOPY                                      10/24/2012    UPPER GI ENDOSCOPY                               12/18/2012      Comment:x3   TONSILLECTOMY                                    2009           Comment:Surgery for OSA (screw in place)   SKIN CANCER EXCISION  Left 2015           Comment:shoulder   MASS EXCISION                                   Right 07/18/2015      Comment:Procedure: EXCISION MASS RIGHT HAND;  Surgeon:               Leanor Kail, MD;  Location: [facility];  Service: Orthopedics;  Laterality:               Right;  CPAP   ESOPHAGOGASTRODUODENOSCOPY (EGD) WITH PROPOFOL  N/A 10/28/2015      Comment:Procedure: ESOPHAGOGASTRODUODENOSCOPY (EGD)               WITH PROPOFOL;  Surgeon: Lollie Sails, MD;              Location: [facility] ENDOSCOPY;  Service: Endoscopy;               Laterality: N/A;   prostate seeding                                              HERNIA REPAIR                                                 Reproductive/Obstetrics negative OB ROS                             Anesthesia Physical Anesthesia Plan  ASA: III  Anesthesia Plan: General ETT   Post-op Pain Management:    Induction: Intravenous  Airway Management Planned:   Additional Equipment:   Intra-op Plan:   Post-operative Plan:   Informed Consent: I have reviewed the patients History and Physical, chart, labs and discussed the procedure including the risks, benefits and alternatives for the proposed anesthesia with the patient or authorized  representative who has indicated his/her understanding and acceptance.   Dental Advisory Given  Plan Discussed with: CRNA  Anesthesia Plan Comments:         Anesthesia Quick Evaluation

## 2015-12-02 NOTE — Op Note (Signed)
OPERATIVE REPORT  PREOPERATIVE DIAGNOSIS:  Chronic cholecystitis   POSTOPERATIVE DIAGNOSIS: Chronic cholecystitis   PROCEDURE: Laparoscopic cholecystectomy   ANESTHESIA: General  SURGEON: Rochel Brome M.D.  INDICATIONS: He has history of right upper quadrant pains, ultrasound findings of gallbladder polyp, hepatobiliary scan with low gallbladder ejection fraction. Gallbladder surgery was recommended for definitive treatment.  With the patient on the operating table in the supine position under general endotracheal anesthesia the abdomen was prepared with ChloraPrep solution and draped in a sterile manner. A short incision was made in the inferior aspect of the umbilicus and carried down to the deep fascia which was grasped with a laryngeal hook. A Veress needle was inserted aspirated and irrigated with a saline solution. The peritoneal cavity was insufflated with carbon dioxide. The Veress needle was removed. The 10 mm cannula was inserted. The 10 mm 0 laparoscope was inserted to view the peritoneal cavity.  Another incision was made in the epigastrium slightly to the right of the midline to introduce an 11 mm cannula. 2 incisions were made in the lateral aspect of the right upper quadrant to introduce 2   5 mm cannulas. Initial inspection revealed the location of the gallbladder. The liver had a smooth surface. Brief survey of the abdomen demonstrated typical appearance of bowel and stomach.  The gallbladder was retracted towards the right shoulder.  The gallbladder neck was retracted inferiorly and laterally.  The porta hepatis was identified. The gallbladder was mobilized with incision of the visceral peritoneum. The cystic duct was dissected free from surrounding structures. The cystic artery was dissected free from surrounding structures. A critical view of safety was demonstrated  An Endo Clip was placed across the cystic duct adjacent to the gallbladder neck. An incision was made in the  cystic duct to introduce a Reddick catheter. The Reddick catheter would not thread into the cystic duct due to the small caliber. Therefore a cholangiogram was not done. The Reddick catheter was removed. The cystic duct was doubly ligated with endoclips and divided. The cystic artery was controlled with double endoclips and divided. The gallbladder was dissected free from the liver with use of hook and cautery and blunt dissection. Bleeding was minimal and hemostasis was intact. The gallbladder was delivered up through the infraumbilical incision opened and suctioned.  The gallbladder was removed and submitted in formalin for routine pathology. The right upper quadrant was further inspected and could see hemostasis was intact. The cannulas were removed allowing carbon dioxide to escape from the peritoneal cavity The skin incisions were closed with interrupted 5-0 chromic subcutaneous suture benzoin and Steri-Strips. Gauze dressings were applied with paper tape.  The patient appeared to be in satisfactory condition and was prepared for transfer to the recovery room  [place].D.

## 2015-12-03 LAB — SURGICAL PATHOLOGY

## 2015-12-03 NOTE — Anesthesia Postprocedure Evaluation (Signed)
Anesthesia Post Note  Patient: Ricky Mercer  Procedure(s) Performed: Procedure(s) (LRB): LAPAROSCOPIC CHOLECYSTECTOMY (N/A)  Patient location during evaluation: PACU Anesthesia Type: General Level of consciousness: awake and alert Pain management: pain level controlled Vital Signs Assessment: post-procedure vital signs reviewed and stable Respiratory status: spontaneous breathing, nonlabored ventilation, respiratory function stable and patient connected to nasal cannula oxygen Cardiovascular status: blood pressure returned to baseline and stable Postop Assessment: no signs of nausea or vomiting Anesthetic complications: no    Last Vitals:  Filed Vitals:   12/02/15 1246 12/02/15 1256  BP: 119/77 122/76  Pulse: 80 87  Temp:    Resp: 16 16    Last Pain:  Filed Vitals:   12/02/15 1310  PainSc: [name]

## 2015-12-17 ENCOUNTER — Ambulatory Visit: Payer: BLUE CROSS/BLUE SHIELD | Admitting: Pain Medicine

## 2015-12-23 ENCOUNTER — Encounter: Payer: Self-pay | Admitting: Pain Medicine

## 2015-12-23 ENCOUNTER — Ambulatory Visit: Payer: BLUE CROSS/BLUE SHIELD | Attending: Pain Medicine | Admitting: Pain Medicine

## 2015-12-23 VITALS — BP 132/85 | HR 71 | Temp 98.3°F | Resp 16 | Ht 69.0 in | Wt 190.0 lb

## 2015-12-23 DIAGNOSIS — Z85828 Personal history of other malignant neoplasm of skin: Secondary | ICD-10-CM | POA: Diagnosis not present

## 2015-12-23 DIAGNOSIS — M79673 Pain in unspecified foot: Secondary | ICD-10-CM | POA: Insufficient documentation

## 2015-12-23 DIAGNOSIS — K5790 Diverticulosis of intestine, part unspecified, without perforation or abscess without bleeding: Secondary | ICD-10-CM | POA: Diagnosis not present

## 2015-12-23 DIAGNOSIS — R531 Weakness: Secondary | ICD-10-CM | POA: Diagnosis not present

## 2015-12-23 DIAGNOSIS — I429 Cardiomyopathy, unspecified: Secondary | ICD-10-CM | POA: Insufficient documentation

## 2015-12-23 DIAGNOSIS — Z9889 Other specified postprocedural states: Secondary | ICD-10-CM | POA: Diagnosis not present

## 2015-12-23 DIAGNOSIS — K219 Gastro-esophageal reflux disease without esophagitis: Secondary | ICD-10-CM | POA: Diagnosis not present

## 2015-12-23 DIAGNOSIS — M25511 Pain in right shoulder: Secondary | ICD-10-CM | POA: Insufficient documentation

## 2015-12-23 DIAGNOSIS — M25512 Pain in left shoulder: Secondary | ICD-10-CM | POA: Insufficient documentation

## 2015-12-23 DIAGNOSIS — M25559 Pain in unspecified hip: Secondary | ICD-10-CM | POA: Diagnosis present

## 2015-12-23 DIAGNOSIS — M1990 Unspecified osteoarthritis, unspecified site: Secondary | ICD-10-CM | POA: Insufficient documentation

## 2015-12-23 DIAGNOSIS — Z5181 Encounter for therapeutic drug level monitoring: Secondary | ICD-10-CM

## 2015-12-23 DIAGNOSIS — Z8546 Personal history of malignant neoplasm of prostate: Secondary | ICD-10-CM | POA: Diagnosis not present

## 2015-12-23 DIAGNOSIS — M79605 Pain in left leg: Secondary | ICD-10-CM | POA: Diagnosis not present

## 2015-12-23 DIAGNOSIS — R079 Chest pain, unspecified: Secondary | ICD-10-CM | POA: Insufficient documentation

## 2015-12-23 DIAGNOSIS — M25552 Pain in left hip: Secondary | ICD-10-CM | POA: Diagnosis not present

## 2015-12-23 DIAGNOSIS — M545 Low back pain: Secondary | ICD-10-CM | POA: Diagnosis not present

## 2015-12-23 DIAGNOSIS — M47816 Spondylosis without myelopathy or radiculopathy, lumbar region: Secondary | ICD-10-CM | POA: Diagnosis not present

## 2015-12-23 DIAGNOSIS — E7800 Pure hypercholesterolemia, unspecified: Secondary | ICD-10-CM | POA: Insufficient documentation

## 2015-12-23 DIAGNOSIS — Z79891 Long term (current) use of opiate analgesic: Secondary | ICD-10-CM | POA: Diagnosis not present

## 2015-12-23 DIAGNOSIS — G8929 Other chronic pain: Secondary | ICD-10-CM

## 2015-12-23 DIAGNOSIS — M79604 Pain in right leg: Secondary | ICD-10-CM | POA: Insufficient documentation

## 2015-12-23 DIAGNOSIS — M542 Cervicalgia: Secondary | ICD-10-CM | POA: Insufficient documentation

## 2015-12-23 DIAGNOSIS — M549 Dorsalgia, unspecified: Secondary | ICD-10-CM | POA: Diagnosis present

## 2015-12-23 DIAGNOSIS — M533 Sacrococcygeal disorders, not elsewhere classified: Secondary | ICD-10-CM | POA: Diagnosis not present

## 2015-12-23 DIAGNOSIS — M25551 Pain in right hip: Secondary | ICD-10-CM | POA: Diagnosis not present

## 2015-12-23 DIAGNOSIS — F1190 Opioid use, unspecified, uncomplicated: Secondary | ICD-10-CM

## 2015-12-23 DIAGNOSIS — M720 Palmar fascial fibromatosis [Dupuytren]: Secondary | ICD-10-CM | POA: Diagnosis not present

## 2015-12-23 DIAGNOSIS — E785 Hyperlipidemia, unspecified: Secondary | ICD-10-CM | POA: Diagnosis not present

## 2015-12-23 MED ORDER — ORPHENADRINE CITRATE 30 MG/ML IJ SOLN: 60.0000 mg | Freq: Once | INTRAMUSCULAR | Status: DC

## 2015-12-23 MED ORDER — KETOROLAC TROMETHAMINE 60 MG/2ML IM SOLN: 60.0000 mg | Freq: Once | INTRAMUSCULAR | Status: AC

## 2015-12-23 MED ORDER — KETOROLAC TROMETHAMINE 60 MG/2ML IM SOLN
INTRAMUSCULAR | Status: AC
Start: 2015-12-23 — End: 2015-12-23
  Administered 2015-12-23: 60 mg via INTRAMUSCULAR
  Filled 2015-12-23: qty 2

## 2015-12-23 MED ORDER — OXYCODONE HCL 10 MG PO TABS: 10.0000 mg | ORAL_TABLET | Freq: Three times a day (TID) | ORAL | Status: DC | PRN

## 2015-12-23 MED ORDER — ORPHENADRINE CITRATE 30 MG/ML IJ SOLN
INTRAMUSCULAR | Status: AC
  Administered 2015-12-23: 60 mg via INTRAMUSCULAR
  Filled 2015-12-23: qty 2

## 2015-12-23 NOTE — Progress Notes (Signed)
Patient's Name: Ricky Mercer  Patient type: Established  MRN: [id number]  Service setting: Ambulatory outpatient  DOB: 1951/03/29  Location: [facility]  DOS: 12/23/2015  Primary Care Physician: Ricky Hartigan, MD  Note by: Ricky Mercer. Dossie Arbour, M.D, DABA, DABAPM, DABPM, DABIPP, FIPP  Referring Physician: Sofie Hartigan, MD  Specialty: Board-Certified Interventional Pain Management     Primary Reason(s) for Visit: Encounter for prescription drug management (Level of risk: moderate) CC: Back Pain and Hip Pain   HPI  [name] is a 65 y.o. year old, male patient, who returns today as an established patient. He has Hand weakness; Polyradiculopathy; Chronic pain; Long term current use of opiate analgesic; Long term prescription opiate use; Opiate use (45 MME/Day); Opiate dependence ([place]); Encounter for therapeutic drug level monitoring; Chronic low back pain  (Location of Primary Pain) (Bilateral) (L>R); Chronic lower extremity pain (Location of Tertiary source of pain) (Bilateral) (L>R); Chronic lumbar radicular pain (Bilateral) (L>R) (Bilateral S1 dermatomal distribution); Chronic hip pain (Location of Secondary source of pain) (Bilateral) (L>R); Chronic upper back pain (Bilateral) (L>R); Chronic neck pain (neck<shoulder) (L>R); Chronic foot pain; Chronic knee pain (Right); Cervical spondylosis; 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); Chronic upper extremity pain (Bilateral) (L>R); Lumbar spondylosis; Cervical facet hypertrophy; Cervical facet syndrome; History of prostate cancer; Radicular pain of shoulder; Class I Morbid obesity (HCC) (68% higher incidence of chronic low back problems); Peripheral neuropathy (HCC) (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); Lumbar facet syndrome (Location of Primary Source of Pain) (Bilateral) (L>R); Sleep apnea; Depression; High cholesterol; Low testosterone; History of surgical procedure; Therapeutic  opioid-induced constipation (OIC); Therapeutic opioid induced constipation; Chronic shoulder pain (Bilateral) (L>R); Breathing orally; Chest pain; Primary cardiomyopathy ([place]); and Chronic sacroiliac joint pain (Location of Primary Source of Pain) (Bilateral) (L>R) on his problem list.. His primarily concern today is the Back Pain and Hip Pain   Pain Assessment: Self-Reported Pain Score: 7 , clinically he looks like a 2/10. Reported level is inconsistent with clinical obrservations. Today I took the time to clearly explained to him how the pain score works. Pain Type: Chronic pain Pain Location: Back (hips) Pain Orientation: Lower, Left, Right Pain Descriptors / Indicators: Aching, Sharp Pain Frequency: Constant  The patient returns to the clinics today after having been switched from the hydrocodone to the oxycodone. He indicates that it works better. He is currently having no problems with the medication. He still having quite a bit of pain primarily in the lower back and therefore today we will schedule him for a diagnostic bilateral sacroiliac joint injection and lumbar facet block. During today's examination he was positive for exact reproduction of his lumbar pain with hyperextension and rotation maneuver as well as positive for bilateral sacroiliac joint pain on Patrick's maneuver. In addition, while doing the [facility] maneuver we noticed that he also was positive for bilateral hip joint pain as well as the sacroiliac joint pain.  Date of Last Visit: 11/17/15 Service Provided on Last Visit: Med Refill  Controlled Substance Pharmacotherapy Assessment  Analgesic: Oxycodone IR 10 mg every 8 hours (30 mg/day) Pill Count: Oxycodone pill count # 7/90 Filled 11-19-15 MME/day: 45 mg/day.  Pharmacokinetics: Onset of action (Liberation/Absorption): Within expected pharmacological parameters Time to Peak effect (Distribution): Timing and results are as within normal expected parameters Duration of  action (Metabolism/Excretion): Within normal limits for medication Pharmacodynamics: Analgesic Effect: More than 50% Activity Facilitation: Medication(s) allow patient to sit, stand, walk, and do the basic  ADLs Perceived Effectiveness: Described as relatively effective, allowing for increase in activities of daily living (ADL) Side-effects or Adverse reactions: None reported Monitoring: [place] PMP: Online review of the past 43-month period conducted. Compliant with practice rules and regulations UDS Results/interpretation: The patient's last UDS was done on 11/17/2015 and it came back with unexpected results. Unfortunately, this is an inaccurate reading since that will 916 note indicated that the patient in fact was on a regimen of hydrocodone/APAP 10/325 one tablet by mouth 4 times a day. The confusion comes with the fact that on 11/17/2015 the patient was switched to oxycodone and 1 week sent the lab the copy of the medication reconciliation handout, he was already reflecting the new medication. This was a simple administrative issue and not one of noncompliance with the patient. Medication Assessment Form: Reviewed. Patient indicates being compliant with therapy Treatment compliance: Compliant Risk Assessment: Aberrant Behavior: None observed today Substance Use Disorder (SUD) Risk Level: Low Risk of opioid abuse or dependence: 0.7-3.0% with doses ? 36 MME/day and 6.1-26% with doses ? 120 MME/day. Opioid Risk Tool (ORT) Score:  3 Low Risk for SUD (Score <3) Depression Scale Score: PHQ-2: PHQ-2 Total Score: 0 No depression (0) PHQ-9: PHQ-9 Total Score: 0 No depression (0-4)  Pharmacologic Plan: No change in therapy, at this time  Laboratory Chemistry  Inflammation Markers Lab Results  Component Value Date   ESRSEDRATE 4 08/21/2015   CRP <0.5 08/21/2015    Renal Function Lab Results  Component Value Date   BUN 13 11/25/2015   CREATININE 0.96 11/25/2015   GFRAA >60 11/25/2015    GFRNONAA >60 11/25/2015    Hepatic Function Lab Results  Component Value Date   AST 31 11/25/2015   ALT 41 11/25/2015   ALBUMIN 3.9 11/25/2015    Electrolytes Lab Results  Component Value Date   NA 138 11/25/2015   K 4.2 11/25/2015   CL 104 11/25/2015   CALCIUM 9.0 11/25/2015   MG 2.1 08/21/2015    Pain Modulating Vitamins No results found for: [place], [id number], [id number], [id number], [id number]  Coagulation Parameters No results found for: INR, LABPROT  Note: I personally reviewed the above data. Results shared with patient.  Meds  The patient has a current medication list which includes the following prescription(s): androgel pump, aspirin, bisacodyl, citalopram, diphenhydramine-acetaminophen, docusate sodium, oxycodone hcl, pantoprazole, simvastatin, and benefiber, and the following Facility-Administered Medications: ketorolac and orphenadrine.  Current Outpatient Prescriptions on File Prior to Visit  Medication Sig  . aspirin 81 MG tablet Take 81 mg by mouth daily.  . bisacodyl (DULCOLAX) 5 MG EC tablet Take 2 tablets (10 mg total) by mouth daily as needed for moderate constipation.  . citalopram (CELEXA) 10 MG tablet Take 10 mg by mouth daily.  . diphenhydramine-acetaminophen (TYLENOL PM EXTRA STRENGTH) 25-500 MG TABS tablet Take 1 tablet by mouth at bedtime as needed.  . docusate sodium (COLACE) 100 MG capsule Take 1 capsule (100 mg total) by mouth 2 (two) times daily. Do not use longer than 7 days. (Patient taking differently: Take 100 mg by mouth 2 (two) times daily as needed. Do not use longer than 7 days.)  . pantoprazole (PROTONIX) 40 MG tablet Take 40 mg by mouth daily.  . simvastatin (ZOCOR) 10 MG tablet Take 10 mg by mouth at bedtime.   . Wheat Dextrin (BENEFIBER) POWD Stir 2 tsp. TID into 4-8 oz of any non-carbonated beverage or soft food (hot or cold)   No current facility-administered medications on file prior  to visit.    ROS  Constitutional:  Afebrile, no chills, well hydrated and well nourished Gastrointestinal: No upper or lower GI bleeding, no nausea, no vomiting and no acute GI distress Musculoskeletal: No acute joint swelling or redness, no acute loss of range of motion and no acute onset weakness Neurological: Denies any acute onset apraxia, no episodes of paralysis, no acute loss of coordination, no acute loss of consciousness and no acute onset aphasia, dysarthria, agnosia, or amnesia  Allergies  Mr. Groeber has No Known Allergies.  [place]  Medical:  Mr. Irvin  has a past medical history of Hyperlipidemia; GERD (gastroesophageal reflux disease); Prostate cancer ([place]); Seasonal allergies; Situational anxiety; Diverticulosis; Dupuytren's contracture; Knee pain; Midline low back pain; Pain in neck; Foot pain, bilateral; Bilateral arm pain; Hand weakness; Osteoarthritis; Sleep apnea; Cardiomyopathy ([place]); and Reactive airway disease. Family: family history includes Colon cancer in his brother; Hyperlipidemia in his sister; Lung cancer in his father; Prostate cancer in his brother. Surgical:  has past surgical history that includes Nose surgery; Hand surgery (Bilateral); Colonoscopy (10/24/2012); Upper gi endoscopy (12/18/2012); Tonsillectomy (2009); Skin cancer excision (Left, 2015); Mass excision (Right, 07/18/2015); Esophagogastroduodenoscopy (egd) with propofol (N/A, 10/28/2015); prostate seeding; Hernia repair; and Cholecystectomy (N/A, 12/02/2015). Tobacco:  reports that he has never smoked. He has never used smokeless tobacco. Alcohol:  reports that he does not drink alcohol. Drug:  reports that he does not use illicit drugs.  Physical Examination  Constitutional Vitals:  Today's Vitals   12/23/15 0814 12/23/15 0815  BP:  132/85  Pulse: 71   Temp: 98.3 F (36.8 C)   Resp: 16   Height: 5\' 9"  (1.753 m)   Weight: 190 lb (86.183 kg)   SpO2: 98%   PainSc: 7  7   PainLoc: Back    Calculated BMI: Body mass index is 28.05  kg/(m^2). Overweight (25-29.9 kg/m2) - 20% higher incidence of chronic pain General appearance: alert, cooperative, appears stated age, mild distress and mildly obese Eyes: PERLA Respiratory: No evidence respiratory distress, no audible rales or ronchi and no use of accessory muscles of respiration  Lumbar Spine  Inspection: No gross anomalies detected Alignment: Symetrical Functional ROM: Limited AROM: Decreased Palpation: Tender Provocative Tests: Lumbar Hyperextension and rotation test: Positive for bilateral lumbar facet pain Patrick's Maneuver: Positive for bilateral sacroiliac joint and hip pain.  Gait Assessment  Gait: Antalgic (limping)  Lower Extremities    Right  Left  Inspection: No gross anomalies detected  Inspection: No gross anomalies detected  Functional ROM: Within functional limits [facility])  Functional ROM: Within functional limits (WFL)  AROM: Decreased hip   AROM: Decreased hip   Sensory: Normal  Sensory: Normal  Motor: Unremarkable  Motor: Unremarkable   Assessment & Plan  Primary Diagnosis & Pertinent Problem List: The primary encounter diagnosis was Chronic pain. Diagnoses of Encounter for therapeutic drug level monitoring, Long term current use of opiate analgesic, Chronic low back pain  (Location of Primary Pain) (Bilateral) (L>R), Lumbar facet syndrome (Location of Primary Source of Pain) (Bilateral) (L>R), Lumbar spondylosis, unspecified spinal osteoarthritis, Chronic hip pain, unspecified laterality, Chronic sacroiliac joint pain, and Opiate use (45 MME/Day) were also pertinent to this visit.  Visit Diagnosis: 1. Chronic pain   2. Encounter for therapeutic drug level monitoring   3. Long term current use of opiate analgesic   4. Chronic low back pain  (Location of Primary Pain) (Bilateral) (L>R)   5. Lumbar facet syndrome (Location of Primary Source of Pain) (Bilateral) (L>R)  6. Lumbar spondylosis, unspecified spinal osteoarthritis   7. Chronic hip  pain, unspecified laterality   8. Chronic sacroiliac joint pain   9. Opiate use (45 MME/Day)     Problem-specific Plan(s): No problem-specific assessment & plan notes found for this encounter.   Plan of Care   Problem List Items Addressed This Visit      High   Lumbar spondylosis (Chronic)   Relevant Medications   Oxycodone HCl 10 MG TABS   orphenadrine (NORFLEX) injection 60 mg   ketorolac (TORADOL) injection 60 mg   orphenadrine (NORFLEX) 30 MG/ML injection (Completed)   ketorolac (TORADOL) 60 MG/2ML injection (Completed)   Lumbar facet syndrome (Location of Primary Source of Pain) (Bilateral) (L>R) (Chronic)   Relevant Medications   Oxycodone HCl 10 MG TABS   orphenadrine (NORFLEX) injection 60 mg   ketorolac (TORADOL) injection 60 mg   orphenadrine (NORFLEX) 30 MG/ML injection (Completed)   ketorolac (TORADOL) 60 MG/2ML injection (Completed)   Other Relevant Orders   LUMBAR FACET(MEDIAL BRANCH NERVE BLOCK) MBNB   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)   Relevant Medications   Oxycodone HCl 10 MG TABS   orphenadrine (NORFLEX) injection 60 mg   ketorolac (TORADOL) injection 60 mg   orphenadrine (NORFLEX) 30 MG/ML injection (Completed)   ketorolac (TORADOL) 60 MG/2ML injection (Completed)   Other Relevant Orders   SACROILIAC JOINT INJECTINS   Chronic pain - Primary (Chronic)   Relevant Medications   Oxycodone HCl 10 MG TABS   orphenadrine (NORFLEX) injection 60 mg   ketorolac (TORADOL) injection 60 mg   orphenadrine (NORFLEX) 30 MG/ML injection (Completed)   ketorolac (TORADOL) 60 MG/2ML injection (Completed)   Chronic low back pain  (Location of Primary Pain) (Bilateral) (L>R) (Chronic)   Relevant Medications   Oxycodone HCl 10 MG TABS   orphenadrine (NORFLEX) injection 60 mg   ketorolac (TORADOL) injection 60 mg   orphenadrine (NORFLEX) 30 MG/ML injection (Completed)   ketorolac (TORADOL) 60 MG/2ML  injection (Completed)   Chronic hip pain (Location of Secondary source of pain) (Bilateral) (L>R) (Chronic)   Relevant Medications   Oxycodone HCl 10 MG TABS   orphenadrine (NORFLEX) injection 60 mg   ketorolac (TORADOL) injection 60 mg   orphenadrine (NORFLEX) 30 MG/ML injection (Completed)   ketorolac (TORADOL) 60 MG/2ML injection (Completed)     Medium   Opiate use (45 MME/Day) (Chronic)   Relevant Medications   ANDROGEL PUMP 20.25 MG/ACT (1.62%) GEL   Long term current use of opiate analgesic (Chronic)   Relevant Orders   ToxASSURE Select 13 (MW), Urine   Encounter for therapeutic drug level monitoring       Pharmacotherapy (Medications Ordered): Meds ordered this encounter  Medications  . Oxycodone HCl 10 MG TABS    Sig: Take 1 tablet (10 mg total) by mouth every 8 (eight) hours as needed.    Dispense:  90 tablet    Refill:  0    Do not place this medication, or any other prescription from our practice, on "Automatic Refill". Patient may have prescription filled one day early if pharmacy is closed on scheduled refill date. Do not fill until: 12/26/15 To last until: 01/25/16  . orphenadrine (NORFLEX) injection 60 mg    Sig:   . ketorolac (TORADOL) injection 60 mg    Sig:   . orphenadrine (NORFLEX) 30 MG/ML injection    Sig:     TICE, KORI: cabinet override  . ketorolac (TORADOL) 60  MG/2ML injection    Sig:     TICE, KORI: cabinet override   Lab-work & Procedure Ordered: Orders Placed This Encounter  Procedures  . LUMBAR FACET(MEDIAL BRANCH NERVE BLOCK) MBNB  . SACROILIAC JOINT INJECTINS  . ToxASSURE Select 13 (MW), Urine    Imaging Ordered: None  Interventional Therapies: Scheduled:  Diagnostic bilateral lumbar facet block + bilateral sacroiliac joint injection under fluoroscopic guidance and IV sedation.    Considering:  Diagnostic bilateral intra-articular hip joint injection under fluoroscopic guidance and IV sedation.    PRN Procedures:  None at this  point.    Referral(s) or Consult(s): None at this time.  New Prescriptions   No medications on file    Medications administered during this visit: We administered orphenadrine and ketorolac.  No future appointments.  Primary Care Physician: [facility], MD Location: [facility] Note by: Ricky Mercer. Dossie Arbour, M.D, DABA, DABAPM, DABPM, DABIPP, FIPP  Pain Score Disclaimer: We use the NRS-11 scale. This is a self-reported, subjective measurement of pain severity with only modest accuracy. It is used primarily to identify changes within a particular patient. It must be understood that outpatient pain scales are significantly less accurate that those used for research, where they can be applied under ideal controlled circumstances with minimal exposure to variables. In reality, the score is likely to be a combination of pain intensity and pain affect, where pain affect describes the degree of emotional arousal or changes in action readiness caused by the sensory experience of pain. Factors such as social and work situation, setting, emotional state, anxiety levels, expectation, and prior pain experience may influence pain perception and show large inter-individual differences that may also be affected by time variables.

## 2015-12-23 NOTE — Patient Instructions (Addendum)
GENERAL RISKS AND COMPLICATIONS  What are the risk, side effects and possible complications? Generally speaking, most procedures are safe.  However, with any procedure there are risks, side effects, and the possibility of complications.  The risks and complications are dependent upon the sites that are lesioned, or the type of nerve block to be performed.  The closer the procedure is to the spine, the more serious the risks are.  Great care is taken when placing the radio frequency needles, block needles or lesioning probes, but sometimes complications can occur. 1. Infection: Any time there is an injection through the skin, there is a risk of infection.  This is why sterile conditions are used for these blocks.  There are four possible types of infection. 1. Localized skin infection. 2. Central Nervous System Infection-This can be in the form of Meningitis, which can be deadly. 3. Epidural Infections-This can be in the form of an epidural abscess, which can cause pressure inside of the spine, causing compression of the spinal cord with subsequent paralysis. This would require an emergency surgery to decompress, and there are no guarantees that the patient would recover from the paralysis. 4. Discitis-This is an infection of the intervertebral discs.  It occurs in about 1% of discography procedures.  It is difficult to treat and it may lead to surgery.        2. Pain: the needles have to go through skin and soft tissues, will cause soreness.       3. Damage to internal structures:  The nerves to be lesioned may be near blood vessels or    other nerves which can be potentially damaged.       4. Bleeding: Bleeding is more common if the patient is taking blood thinners such as  aspirin, Coumadin, Ticiid, Plavix, etc., or if he/she have some genetic predisposition  such as hemophilia. Bleeding into the spinal canal can cause compression of the spinal  cord with subsequent paralysis.  This would require an  emergency surgery to  decompress and there are no guarantees that the patient would recover from the  paralysis.       5. Pneumothorax:  Puncturing of a lung is a possibility, every time a needle is introduced in  the area of the chest or upper back.  Pneumothorax refers to free air around the  collapsed lung(s), inside of the thoracic cavity (chest cavity).  Another two possible  complications related to a similar event would include: Hemothorax and Chylothorax.   These are variations of the Pneumothorax, where instead of air around the collapsed  lung(s), you may have blood or chyle, respectively.       6. Spinal headaches: They may occur with any procedures in the area of the spine.       7. Persistent CSF (Cerebro-Spinal Fluid) leakage: This is a rare problem, but may occur  with prolonged intrathecal or epidural catheters either due to the formation of a fistulous  track or a dural tear.       8. Nerve damage: By working so close to the spinal cord, there is always a possibility of  nerve damage, which could be as serious as a permanent spinal cord injury with  paralysis.       9. Death:  Although rare, severe deadly allergic reactions known as "Anaphylactic  reaction" can occur to any of the medications used.      10. Worsening of the symptoms:  We can always make thing worse.    What are the chances of something like this happening? Chances of any of this occuring are extremely low.  By statistics, you have more of a chance of getting killed in a motor vehicle accident: while driving to the hospital than any of the above occurring .  Nevertheless, you should be aware that they are possibilities.  In general, it is similar to taking a shower.  Everybody knows that you can slip, hit your head and get killed.  Does that mean that you should not shower again?  Nevertheless always keep in mind that statistics do not mean anything if you happen to be on the wrong side of them.  Even if a procedure has a 1  (one) in a 1,000,000 (million) chance of going wrong, it you happen to be that one..Also, keep in mind that by statistics, you have more of a chance of having something go wrong when taking medications.  Who should not have this procedure? If you are on a blood thinning medication (e.g. Coumadin, Plavix, see list of "Blood Thinners"), or if you have an active infection going on, you should not have the procedure.  If you are taking any blood thinners, please inform your physician.  How should I prepare for this procedure?  Do not eat or drink anything at least six hours prior to the procedure.  Bring a driver with you .  It cannot be a taxi.  Come accompanied by an adult that can drive you back, and that is strong enough to help you if your legs get weak or numb from the local anesthetic.  Take all of your medicines the morning of the procedure with just enough water to swallow them.  If you have diabetes, make sure that you are scheduled to have your procedure done first thing in the morning, whenever possible.  If you have diabetes, take only half of your insulin dose and notify our nurse that you have done so as soon as you arrive at the clinic.  If you are diabetic, but only take blood sugar pills (oral hypoglycemic), then do not take them on the morning of your procedure.  You may take them after you have had the procedure.  Do not take aspirin or any aspirin-containing medications, at least eleven (11) days prior to the procedure.  They may prolong bleeding.  Wear loose fitting clothing that may be easy to take off and that you would not mind if it got stained with Betadine or blood.  Do not wear any jewelry or perfume  Remove any nail coloring.  It will interfere with some of our monitoring equipment.  NOTE: Remember that this is not meant to be interpreted as a complete list of all possible complications.  Unforeseen problems may occur.  BLOOD THINNERS The following drugs  contain aspirin or other products, which can cause increased bleeding during surgery and should not be taken for 2 weeks prior to and 1 week after surgery.  If you should need take something for relief of minor pain, you may take acetaminophen which is found in Tylenol,m Datril, Anacin-3 and Panadol. It is not blood thinner. The products listed below are.  Do not take any of the products listed below in addition to any listed on your instruction sheet.  A.P.C or A.P.C with Codeine Codeine Phosphate Capsules #3 Ibuprofen Ridaura  ABC compound Congesprin Imuran rimadil  Advil Cope Indocin Robaxisal  Alka-Seltzer Effervescent Pain Reliever and Antacid Coricidin or Coricidin-D  Indomethacin Rufen    Alka-Seltzer plus Cold Medicine Cosprin Ketoprofen S-A-C Tablets  Anacin Analgesic Tablets or Capsules Coumadin Korlgesic Salflex  Anacin Extra Strength Analgesic tablets or capsules CP-2 Tablets Lanoril Salicylate  Anaprox Cuprimine Capsules Levenox Salocol  Anexsia-D Dalteparin Magan Salsalate  Anodynos Darvon compound Magnesium Salicylate Sine-off  Ansaid Dasin Capsules Magsal Sodium Salicylate  Anturane Depen Capsules Marnal Soma  APF Arthritis pain formula Dewitt's Pills Measurin Stanback  Argesic Dia-Gesic Meclofenamic Sulfinpyrazone  Arthritis Bayer Timed Release Aspirin Diclofenac Meclomen Sulindac  Arthritis pain formula Anacin Dicumarol Medipren Supac  Analgesic (Safety coated) Arthralgen Diffunasal Mefanamic Suprofen  Arthritis Strength Bufferin Dihydrocodeine Mepro Compound Suprol  Arthropan liquid Dopirydamole Methcarbomol with Aspirin Synalgos  ASA tablets/Enseals Disalcid Micrainin Tagament  Ascriptin Doan's Midol Talwin  Ascriptin A/D Dolene Mobidin Tanderil  Ascriptin Extra Strength Dolobid Moblgesic Ticlid  Ascriptin with Codeine Doloprin or Doloprin with Codeine Momentum Tolectin  Asperbuf Duoprin Mono-gesic Trendar  Aspergum Duradyne Motrin or Motrin IB Triminicin  Aspirin  plain, buffered or enteric coated Durasal Myochrisine Trigesic  Aspirin Suppositories Easprin Nalfon Trillsate  Aspirin with Codeine Ecotrin Regular or Extra Strength Naprosyn Uracel  Atromid-S Efficin Naproxen Ursinus  Auranofin Capsules Elmiron Neocylate Vanquish  Axotal Emagrin Norgesic Verin  Azathioprine Empirin or Empirin with Codeine Normiflo Vitamin E  Azolid Emprazil Nuprin Voltaren  Bayer Aspirin plain, buffered or children's or timed BC Tablets or powders Encaprin Orgaran Warfarin Sodium  Buff-a-Comp Enoxaparin Orudis Zorpin  Buff-a-Comp with Codeine Equegesic Os-Cal-Gesic   Buffaprin Excedrin plain, buffered or Extra Strength Oxalid   Bufferin Arthritis Strength Feldene Oxphenbutazone   Bufferin plain or Extra Strength Feldene Capsules Oxycodone with Aspirin   Bufferin with Codeine Fenoprofen Fenoprofen Pabalate or Pabalate-SF   Buffets II Flogesic Panagesic   Buffinol plain or Extra Strength Florinal or Florinal with Codeine Panwarfarin   Buf-Tabs Flurbiprofen Penicillamine   Butalbital Compound Four-way cold tablets Penicillin   Butazolidin Fragmin Pepto-Bismol   Carbenicillin Geminisyn Percodan   Carna Arthritis Reliever Geopen Persantine   Carprofen Gold's salt Persistin   Chloramphenicol Goody's Phenylbutazone   Chloromycetin Haltrain Piroxlcam   Clmetidine heparin Plaquenil   Cllnoril Hyco-pap Ponstel   Clofibrate Hydroxy chloroquine Propoxyphen         Before stopping any of these medications, be sure to consult the physician who ordered them.  Some, such as Coumadin (Warfarin) are ordered to prevent or treat serious conditions such as "deep thrombosis", "pumonary embolisms", and other heart problems.  The amount of time that you may need off of the medication may also vary with the medication and the reason for which you were taking it.  If you are taking any of these medications, please make sure you notify your pain physician before you undergo any  procedures.         Facet Blocks Patient Information  Description: The facets are joints in the spine between the vertebrae.  Like any joints in the body, facets can become irritated and painful.  Arthritis can also effect the facets.  By injecting steroids and local anesthetic in and around these joints, we can temporarily block the nerve supply to them.  Steroids act directly on irritated nerves and tissues to reduce selling and inflammation which often leads to decreased pain.  Facet blocks may be done anywhere along the spine from the neck to the low back depending upon the location of your pain.   After numbing the skin with local anesthetic (like Novocaine), a small needle is passed onto the facet joints under x-ray guidance.    You may experience a sensation of pressure while this is being done.  The entire block usually lasts about 15-25 minutes.   Conditions which may be treated by facet blocks:   Low back/buttock pain  Neck/shoulder pain  Certain types of headaches  Preparation for the injection:  1. Do not eat any solid food or dairy products within 8 hours of your appointment. 2. You may drink clear liquid up to 3 hours before appointment.  Clear liquids include water, black coffee, juice or soda.  No milk or cream please. 3. You may take your regular medication, including pain medications, with a sip of water before your appointment.  Diabetics should hold regular insulin (if taken separately) and take 1/2 normal NPH dose the morning of the procedure.  Carry some sugar containing items with you to your appointment. 4. A driver must accompany you and be prepared to drive you home after your procedure. 5. Bring all your current medications with you. 6. An IV may be inserted and sedation may be given at the discretion of the physician. 7. A blood pressure cuff, EKG and other monitors will often be applied during the procedure.  Some patients may need to have extra oxygen  administered for a short period. 8. You will be asked to provide medical information, including your allergies and medications, prior to the procedure.  We must know immediately if you are taking blood thinners (like Coumadin/Warfarin) or if you are allergic to IV iodine contrast (dye).  We must know if you could possible be pregnant.  Possible side-effects:   Bleeding from needle site  Infection (rare, may require surgery)  Nerve injury (rare)  Numbness & tingling (temporary)  Difficulty urinating (rare, temporary)  Spinal headache (a headache worse with upright posture)  Light-headedness (temporary)  Pain at injection site (serveral days)  Decreased blood pressure (rare, temporary)  Weakness in arm/leg (temporary)  Pressure sensation in back/neck (temporary)   Call if you experience:   Fever/chills associated with headache or increased back/neck pain  Headache worsened by an upright position  New onset, weakness or numbness of an extremity below the injection site  Hives or difficulty breathing (go to the emergency room)  Inflammation or drainage at the injection site(s)  Severe back/neck pain greater than usual  New symptoms which are concerning to you  Please note:  Although the local anesthetic injected can often make your back or neck feel good for several hours after the injection, the pain will likely return. It takes 3-7 days for steroids to work.  You may not notice any pain relief for at least one week.  If effective, we will often do a series of 2-3 injections spaced 3-6 weeks apart to maximally decrease your pain.  After the initial series, you may be a candidate for a more permanent nerve block of the facets.  If you have any questions, please call #[phone]) [facility] Patient Information  Description: The sacroiliac joint connects the scrum (very low back and tailbone) to the  ilium (a pelvic bone which also forms half of the hip joint).  Normally this joint experiences very little motion.  When this joint becomes inflamed or unstable low back and or hip and pelvis pain may result.  Injection of this joint with local anesthetics (numbing medicines) and steroids can provide diagnostic information and reduce pain.  This injection is performed with the aid of x-ray guidance into the tailbone area while  you are lying on your stomach.   You may experience an electrical sensation down the leg while this is being done.  You may also experience numbness.  We also may ask if we are reproducing your normal pain during the injection.  Conditions which may be treated SI injection:   Low back, buttock, hip or leg pain  Preparation for the Injection:  1. Do not eat any solid food or dairy products within 8 hours of your appointment.  2. You may drink clear liquids up to 3 hours before appointment.  Clear liquids include water, black coffee, juice or soda.  No milk or cream please. 3. You may take your regular medications, including pain medications with a sip of water before your appointment.  Diabetics should hold regular insulin (if take separately) and take 1/2 normal NPH dose the morning of the procedure.  Carry some sugar containing items with you to your appointment. 4. A driver must accompany you and be prepared to drive you home after your procedure. 5. Bring all of your current medications with you. 6. An IV may be inserted and sedation may be given at the discretion of the physician. 7. A blood pressure cuff, EKG and other monitors will often be applied during the procedure.  Some patients may need to have extra oxygen administered for a short period.  8. You will be asked to provide medical information, including your allergies, prior to the procedure.  We must know immediately if you are taking blood thinners (like Coumadin/Warfarin) or if you are allergic to IV iodine  contrast (dye).  We must know if you could possible be pregnant.  Possible side effects:   Bleeding from needle site  Infection (rare, may require surgery)  Nerve injury (rare)  Numbness & tingling (temporary)  A brief convulsion or seizure  Light-headedness (temporary)  Pain at injection site (several days)  Decreased blood pressure (temporary)  Weakness in the leg (temporary)   Call if you experience:   New onset weakness or numbness of an extremity below the injection site that last more than 8 hours.  Hives or difficulty breathing ( go to the emergency room)  Inflammation or drainage at the injection site  Any new symptoms which are concerning to you  Please note:  Although the local anesthetic injected can often make your back/ hip/ buttock/ leg feel good for several hours after the injections, the pain will likely return.  It takes 3-7 days for steroids to work in the sacroiliac area.  You may not notice any pain relief for at least that one week.  If effective, we will often do a series of three injections spaced 3-6 weeks apart to maximally decrease your pain.  After the initial series, we generally will wait some months before a repeat injection of the same type.  If you have any questions, please call [phone] [facility]

## 2015-12-23 NOTE — Progress Notes (Signed)
Safety precautions to be maintained throughout the outpatient stay will include: orient to surroundings, keep bed in low position, maintain call bell within reach at all times, provide assistance with transfer out of bed and ambulation. Oxycodone pill count # [phone]/[id number]  Filled 11-19-15

## 2015-12-27 LAB — TOXASSURE SELECT 13 (MW), URINE: PDF: 0

## 2016-01-01 ENCOUNTER — Ambulatory Visit: Payer: BLUE CROSS/BLUE SHIELD | Admitting: Pain Medicine

## 2016-01-06 ENCOUNTER — Ambulatory Visit: Payer: BLUE CROSS/BLUE SHIELD | Admitting: Pain Medicine

## 2016-01-08 ENCOUNTER — Encounter: Payer: Self-pay | Admitting: Pain Medicine

## 2016-01-08 ENCOUNTER — Ambulatory Visit: Payer: BLUE CROSS/BLUE SHIELD | Attending: Pain Medicine | Admitting: Pain Medicine

## 2016-01-08 VITALS — BP 122/82 | HR 78 | Temp 98.0°F | Resp 16 | Ht 69.0 in | Wt 194.0 lb

## 2016-01-08 DIAGNOSIS — M47816 Spondylosis without myelopathy or radiculopathy, lumbar region: Secondary | ICD-10-CM | POA: Diagnosis not present

## 2016-01-08 DIAGNOSIS — R079 Chest pain, unspecified: Secondary | ICD-10-CM | POA: Insufficient documentation

## 2016-01-08 DIAGNOSIS — G4730 Sleep apnea, unspecified: Secondary | ICD-10-CM | POA: Insufficient documentation

## 2016-01-08 DIAGNOSIS — G8929 Other chronic pain: Secondary | ICD-10-CM | POA: Diagnosis not present

## 2016-01-08 DIAGNOSIS — Z79891 Long term (current) use of opiate analgesic: Secondary | ICD-10-CM | POA: Diagnosis not present

## 2016-01-08 DIAGNOSIS — M25552 Pain in left hip: Secondary | ICD-10-CM | POA: Diagnosis not present

## 2016-01-08 DIAGNOSIS — M25559 Pain in unspecified hip: Secondary | ICD-10-CM | POA: Diagnosis present

## 2016-01-08 DIAGNOSIS — M47812 Spondylosis without myelopathy or radiculopathy, cervical region: Secondary | ICD-10-CM | POA: Insufficient documentation

## 2016-01-08 DIAGNOSIS — M549 Dorsalgia, unspecified: Secondary | ICD-10-CM | POA: Diagnosis present

## 2016-01-08 DIAGNOSIS — M25512 Pain in left shoulder: Secondary | ICD-10-CM | POA: Diagnosis not present

## 2016-01-08 DIAGNOSIS — K5903 Drug induced constipation: Secondary | ICD-10-CM | POA: Diagnosis not present

## 2016-01-08 DIAGNOSIS — M79601 Pain in right arm: Secondary | ICD-10-CM | POA: Insufficient documentation

## 2016-01-08 DIAGNOSIS — M4802 Spinal stenosis, cervical region: Secondary | ICD-10-CM | POA: Diagnosis not present

## 2016-01-08 DIAGNOSIS — G629 Polyneuropathy, unspecified: Secondary | ICD-10-CM | POA: Diagnosis not present

## 2016-01-08 DIAGNOSIS — Z8546 Personal history of malignant neoplasm of prostate: Secondary | ICD-10-CM | POA: Insufficient documentation

## 2016-01-08 DIAGNOSIS — M542 Cervicalgia: Secondary | ICD-10-CM | POA: Insufficient documentation

## 2016-01-08 DIAGNOSIS — E7800 Pure hypercholesterolemia, unspecified: Secondary | ICD-10-CM | POA: Insufficient documentation

## 2016-01-08 DIAGNOSIS — M79604 Pain in right leg: Secondary | ICD-10-CM | POA: Diagnosis not present

## 2016-01-08 DIAGNOSIS — I429 Cardiomyopathy, unspecified: Secondary | ICD-10-CM | POA: Insufficient documentation

## 2016-01-08 DIAGNOSIS — M25551 Pain in right hip: Secondary | ICD-10-CM | POA: Insufficient documentation

## 2016-01-08 DIAGNOSIS — M545 Low back pain: Secondary | ICD-10-CM | POA: Insufficient documentation

## 2016-01-08 DIAGNOSIS — M79605 Pain in left leg: Secondary | ICD-10-CM | POA: Diagnosis not present

## 2016-01-08 DIAGNOSIS — Z9889 Other specified postprocedural states: Secondary | ICD-10-CM | POA: Insufficient documentation

## 2016-01-08 DIAGNOSIS — R531 Weakness: Secondary | ICD-10-CM | POA: Insufficient documentation

## 2016-01-08 DIAGNOSIS — M25511 Pain in right shoulder: Secondary | ICD-10-CM | POA: Diagnosis not present

## 2016-01-08 DIAGNOSIS — M47896 Other spondylosis, lumbar region: Secondary | ICD-10-CM | POA: Insufficient documentation

## 2016-01-08 DIAGNOSIS — M546 Pain in thoracic spine: Secondary | ICD-10-CM | POA: Diagnosis not present

## 2016-01-08 MED ORDER — LACTATED RINGERS IV SOLN: 1000.0000 mL | Freq: Once | INTRAVENOUS | Status: DC

## 2016-01-08 MED ORDER — ROPIVACAINE HCL 2 MG/ML IJ SOLN: 9.0000 mL | Freq: Once | INTRAMUSCULAR | Status: DC

## 2016-01-08 MED ORDER — MIDAZOLAM HCL 5 MG/5ML IJ SOLN
INTRAMUSCULAR | Status: AC
Start: 2016-01-08 — End: 2016-01-08
  Administered 2016-01-08: 2 mg via INTRAVENOUS
  Filled 2016-01-08: qty 5

## 2016-01-08 MED ORDER — MIDAZOLAM BOLUS VIA INFUSION
1.0000 mg | INTRAVENOUS | Status: DC | PRN
  Filled 2016-01-08: qty 2

## 2016-01-08 MED ORDER — FENTANYL CITRATE (PF) 100 MCG/2ML IJ SOLN
INTRAMUSCULAR | Status: AC
  Administered 2016-01-08: 50 ug via INTRAVENOUS
  Filled 2016-01-08: qty 2

## 2016-01-08 MED ORDER — FENTANYL BOLUS VIA INFUSION
25.0000 ug | INTRAVENOUS | Status: DC | PRN
  Filled 2016-01-08: qty 50

## 2016-01-08 MED ORDER — TRIAMCINOLONE ACETONIDE 40 MG/ML IJ SUSP: 40.0000 mg | Freq: Once | INTRAMUSCULAR | Status: DC

## 2016-01-08 MED ORDER — LIDOCAINE HCL (PF) 1 % IJ SOLN: 10.0000 mL | Freq: Once | INTRAMUSCULAR | Status: DC

## 2016-01-08 MED ORDER — ROPIVACAINE HCL 2 MG/ML IJ SOLN
INTRAMUSCULAR | Status: AC
  Administered 2016-01-08: 11:00:00
  Filled 2016-01-08: qty 20

## 2016-01-08 MED ORDER — TRIAMCINOLONE ACETONIDE 40 MG/ML IJ SUSP
INTRAMUSCULAR | Status: AC
  Administered 2016-01-08: 11:00:00
  Filled 2016-01-08: qty 2

## 2016-01-08 NOTE — Patient Instructions (Addendum)
Pain Management Discharge Instructions  General Discharge Instructions :  If you need to reach your doctor call: Monday-Friday 8:00 am - 4:00 pm at [phone] or toll free [phone].  After clinic hours [phone] to have operator reach doctor.  Bring all of your medication bottles to all your appointments in the pain clinic.  To cancel or reschedule your appointment with Pain Management please remember to call 24 hours in advance to avoid a fee.  Refer to the educational materials which you have been given on: General Risks, I had my Procedure. Discharge Instructions, Post Sedation.  Post Procedure Instructions:  The drugs you were given will stay in your system until tomorrow, so for the next 24 hours you should not drive, make any legal decisions or drink any alcoholic beverages.  You may eat anything you prefer, but it is better to start with liquids then soups and crackers, and gradually work up to solid foods.  Please notify your doctor immediately if you have any unusual bleeding, trouble breathing or pain that is not related to your normal pain.  Depending on the type of procedure that was done, some parts of your body may feel week and/or numb.  This usually clears up by tonight or the next day.  Walk with the use of an assistive device or accompanied by an adult for the 24 hours.  You may use ice on the affected area for the first 24 hours.  Put ice in a Ziploc bag and cover with a towel and place against area 15 minutes on 15 minutes off.  You may switch to heat after 24 hours.Facet Joint Block The facet joints connect the bones of the spine (vertebrae). They make it possible for you to bend, twist, and make other movements with your spine. They also prevent you from overbending, overtwisting, and making other excessive movements.  A facet joint block is a procedure where a numbing medicine (anesthetic) is injected into a facet joint. Often, a type of anti-inflammatory  medicine called a steroid is also injected. A facet joint block may be done for two reasons:  1. Diagnosis. A facet joint block may be done as a test to see whether neck or back pain is caused by a worn-down or infected facet joint. If the pain gets better after a facet joint block, it means the pain is probably coming from the facet joint. If the pain does not get better, it means the pain is probably not coming from the facet joint.  2. Therapy. A facet joint block may be done to relieve neck or back pain caused by a facet joint. A facet joint block is only done as a therapy if the pain does not improve with medicine, exercise programs, physical therapy, and other forms of pain management. LET [facility] CARE PROVIDER KNOW ABOUT:  1. Any allergies you have.  2. All medicines you are taking, including vitamins, herbs, eyedrops, and over-the-counter medicines and creams.  3. Previous problems you or members of your family have had with the use of anesthetics.  4. Any blood disorders you have had.  5. Other health problems you have. RISKS AND COMPLICATIONS Generally, having a facet joint block is safe. However, as with any procedure, complications can occur. Possible complications associated with having a facet joint block include:  1. Bleeding.  2. Injury to a nerve near the injection site.  3. Pain at the injection site.  4. Weakness or numbness in areas controlled by nerves near  the injection site.  5. Infection.  6. Temporary fluid retention.  7. Allergic reaction to anesthetics or medicines used during the procedure. BEFORE THE PROCEDURE  1. Follow your health care provider's instructions if you are taking dietary supplements or medicines. You may need to stop taking them or reduce your dosage.  2. Do not take any new dietary supplements or medicines without asking your health care provider first.  3. Follow your health care provider's instructions about eating and drinking  before the procedure. You may need to stop eating and drinking several hours before the procedure.  4. Arrange to have an adult drive you home after the procedure. PROCEDURE  You may need to remove your clothing and dress in an open-back gown so that your health care provider can access your spine.   The procedure will be done while you are lying on an X-ray table. Most of the time you will be asked to lie on your stomach, but you may be asked to lie in a different position if an injection will be made in your neck.   Special machines will be used to monitor your oxygen levels, heart rate, and blood pressure.   If an injection will be made in your neck, an intravenous (IV) tube will be inserted into one of your veins. Fluids and medicine will flow directly into your body through the IV tube.   The area over the facet joint where the injection will be made will be cleaned with an antiseptic soap. The surrounding skin will be covered with sterile drapes.   An anesthetic will be applied to your skin to make the injection area numb. You may feel a temporary stinging or burning sensation.   A video X-ray machine will be used to locate the joint. A contrast dye may be injected into the facet joint area to help with locating the joint.   When the joint is located, an anesthetic medicine will be injected into the joint through the needle.   Your health care provider will ask you whether you feel pain relief. If you do feel relief, a steroid may be injected to provide pain relief for a longer period of time. If you do not feel relief or feel only partial relief, additional injections of an anesthetic may be made in other facet joints.   The needle will be removed, the skin will be cleansed, and bandages will be applied.  AFTER THE PROCEDURE   You will be observed for 15-30 minutes before being allowed to go home. Do not drive. Have an adult drive you or take a taxi or public transportation  instead.   If you feel pain relief, the pain will return in several hours or days when the anesthetic wears off.   You may feel pain relief 2-14 days after the procedure. The amount of time this relief lasts varies from person to person.   It is normal to feel some tenderness over the injected area(s) for 2 days following the procedure.   If you have diabetes, you may have a temporary increase in blood sugar.   This information is not intended to replace advice given to you by your health care provider. Make sure you discuss any questions you have with your health care provider.   Document Released: 01/19/2007 Document Revised: 09/20/2014 Document Reviewed: 06/19/2012 [employer] Interactive Patient Education 2016 [employer] Epidural Steroid Injection Patient Information  Description: The epidural space surrounds the nerves as they exit the spinal cord.  In some patients, the nerves can be compressed and inflamed by a bulging disc or a tight spinal canal (spinal stenosis).  By injecting steroids into the epidural space, we can bring irritated nerves into direct contact with a potentially helpful medication.  These steroids act directly on the irritated nerves and can reduce swelling and inflammation which often leads to decreased pain.  Epidural steroids may be injected anywhere along the spine and from the neck to the low back depending upon the location of your pain.   After numbing the skin with local anesthetic (like Novocaine), a small needle is passed into the epidural space slowly.  You may experience a sensation of pressure while this is being done.  The entire block usually last less than 10 minutes.  Conditions which may be treated by epidural steroids:   Low back and leg pain  Neck and arm pain  Spinal stenosis  Post-laminectomy syndrome  Herpes zoster (shingles) pain  Pain from compression fractures  Preparation for the injection:  6. Do not eat any solid food  or dairy products within 8 hours of your appointment.  7. You may drink clear liquids up to 3 hours before appointment.  Clear liquids include water, black coffee, juice or soda.  No milk or cream please. 8. You may take your regular medication, including pain medications, with a sip of water before your appointment  Diabetics should hold regular insulin (if taken separately) and take 1/2 normal NPH dos the morning of the procedure.  Carry some sugar containing items with you to your appointment. 9. A driver must accompany you and be prepared to drive you home after your procedure.  10. Bring all your current medications with your. 11. An IV may be inserted and sedation may be given at the discretion of the physician.   12. A blood pressure cuff, EKG and other monitors will often be applied during the procedure.  Some patients may need to have extra oxygen administered for a short period. 55. You will be asked to provide medical information, including your allergies, prior to the procedure.  We must know immediately if you are taking blood thinners (like Coumadin/Warfarin)  Or if you are allergic to IV iodine contrast (dye). We must know if you could possible be pregnant.  Possible side-effects:  Bleeding from needle site  Infection (rare, may require surgery)  Nerve injury (rare)  Numbness & tingling (temporary)  Difficulty urinating (rare, temporary)  Spinal headache ( a headache worse with upright posture)  Light -headedness (temporary)  Pain at injection site (several days)  Decreased blood pressure (temporary)  Weakness in arm/leg (temporary)  Pressure sensation in back/neck (temporary)  Call if you experience:  Fever/chills associated with headache or increased back/neck pain.  Headache worsened by an upright position.  New onset weakness or numbness of an extremity below the injection site  Hives or difficulty breathing (go to the emergency room)  Inflammation or  drainage at the infection site  Severe back/neck pain  Any new symptoms which are concerning to you  Please note:  Although the local anesthetic injected can often make your back or neck feel good for several hours after the injection, the pain will likely return.  It takes 3-7 days for steroids to work in the epidural space.  You may not notice any pain relief for at least that one week.  If effective, we will often do a series of three injections spaced 3-6 weeks apart to maximally decrease your  pain.  After the initial series, we generally will wait several months before considering a repeat injection of the same type.  If you have any questions, please call [phone] [facility]

## 2016-01-08 NOTE — Progress Notes (Signed)
Safety precautions to be maintained throughout the outpatient stay will include: orient to surroundings, keep bed in low position, maintain call bell within reach at all times, provide assistance with transfer out of bed and ambulation.  

## 2016-01-08 NOTE — Progress Notes (Signed)
Patient's Name: MARKO SKALSKI  Patient type: Established  MRN: [id number]  Service setting: Ambulatory outpatient  DOB: August 18, 1951  Location: [facility]  DOS: 01/08/2016  Primary Care Physician: Sofie Hartigan, MD  Note by: Kathlen Brunswick. Dossie Arbour, M.D, DABA, DABAPM, DABPM, DABIPP, FIPP  Referring Physician: Sofie Hartigan, MD  Specialty: Board-Certified Interventional Pain Management     Primary Reason(s) for Visit: Interventional Pain Management Treatment. CC: Back Pain and Hip Pain  Primary Diagnosis: Facet syndrome, lumbar [M54.5]   Procedure:  Anesthesia, Analgesia, Anxiolysis:  Type: Diagnostic Medial Branch Facet Block Region: Lumbar Level: L2, L3, L4, L5, & S1 Medial Branch Level(s) Laterality: Bilateral  Indications: 1. Lumbar facet syndrome (Location of Primary Source of Pain) (Bilateral) (L>R)   2. Lumbar spondylosis, unspecified spinal osteoarthritis   3. Chronic low back pain  (Location of Primary Pain) (Bilateral) (L>R)     Pre-procedure Pain Score: 5/10 Reported level of pain is compatible with clinical observations Post-procedure Pain Score: 3   Type: Moderate (Conscious) Sedation & Local Anesthesia Local Anesthetic: Lidocaine 1% Route: Intravenous (IV) IV Access: Secured Sedation: Meaningful verbal contact was maintained at all times during the procedure  Indication(s): Analgesia & Anxiolysis   Pre-Procedure Assessment:  Mr. Stankovich is a 65 y.o. year old, male patient, seen today for interventional treatment. He has Hand weakness; Polyradiculopathy; Chronic pain; Long term current use of opiate analgesic; Long term prescription opiate use; Opiate use (45 MME/Day); Opiate dependence ([place]); Encounter for therapeutic drug level monitoring; Chronic low back pain  (Location of Primary Pain) (Bilateral) (L>R); Chronic lower extremity pain (Location of Tertiary source of pain) (Bilateral) (L>R); Chronic lumbar radicular pain (Bilateral)  (L>R) (Bilateral S1 dermatomal distribution); Chronic hip pain (Location of Secondary source of pain) (Bilateral) (L>R); Chronic upper back pain (Bilateral) (L>R); Chronic neck pain (neck<shoulder) (L>R); Chronic foot pain; Chronic knee pain (Right); Cervical spondylosis; 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); Chronic upper extremity pain (Bilateral) (L>R); Lumbar spondylosis; Cervical facet hypertrophy; Cervical facet syndrome; History of prostate cancer; Radicular pain of shoulder; Class I Morbid obesity (HCC) (68% higher incidence of chronic low back problems); Peripheral neuropathy (HCC) (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 His primarily concern today is the Back Pain and Hip Pain   Pain Descriptors / Indicators: Aching, Sharp, Constant Pain Frequency: Constant  Date of Last Visit: 12/23/15 Service Provided on Last Visit: Evaluation  Verification of the correct person, correct site (including marking of site), and correct procedure were performed and confirmed by the patient.  Consent: Secured. Under the influence of no sedatives a written informed consent was obtained, after having provided information on the risks and possible complications. To fulfill our ethical and legal obligations, as recommended by the American Medical Association's Code of Ethics, we have provided information to the patient about our clinical impression; the nature and purpose of the treatment or procedure; the risks, benefits,  and possible complications of the intervention; alternatives; the risk(s) and benefit(s) of the alternative treatment(s) or procedure(s); and the risk(s) and benefit(s) of doing nothing. The patient was provided information about the risks and possible complications associated with the  procedure. These include, but are not limited to, failure to achieve desired goals, infection, bleeding, organ or nerve damage, allergic reactions, paralysis, and death. In the case of spinal procedures these may include, but are not limited to, failure to achieve desired goals, infection, bleeding, organ or nerve damage, allergic reactions, paralysis, and death. In addition, the patient was informed that Medicine is not an exact science; therefore, there is also the possibility of unforeseen risks and possible complications that may result in a catastrophic outcome. The patient indicated having understood very clearly. We have given the patient no guarantees and we have made no promises. Enough time was given to the patient to ask questions, all of which were answered to the patient's satisfaction.  Consent Attestation: I, the ordering provider, attest that I have discussed with the patient the benefits, risks, side-effects, alternatives, likelihood of achieving goals, and potential problems during recovery for the procedure that I have provided informed consent.  Pre-Procedure Preparation: Safety Precautions: Allergies reviewed. Appropriate site, procedure, and patient were confirmed by following the Joint Commission's Universal Protocol (UP.01.01.01), in the form of a "Time Out". The patient was asked to confirm marked site and procedure, before commencing. The patient was asked about blood thinners, or active infections, both of which were denied. Patient was assessed for positional comfort and all pressure points were checked before starting procedure. Allergies: He has No Known Allergies.. Infection Control Precautions:  Sterile technique used. Standard Universal Precautions were taken as recommended by the Department of [facility] for Disease Control and Prevention (CDC). Standard pre-surgical skin prep was conducted. Respiratory hygiene and cough etiquette was practiced. Hand hygiene observed. Safe injection practices and needle disposal techniques followed. SDV (single dose vial) medications used. Medications properly checked for expiration dates and contaminants. Personal protective equipment (PPE) used: Sterile Radiation-resistant gloves. Monitoring:  As per clinic protocol. Filed Vitals:   01/08/16 1050 01/08/16 1059 01/08/16 1110 01/08/16 1119  BP: 114/84 129/91 115/81 122/82  Pulse:      Temp:      TempSrc:      Resp: 14 14 16 16   Height:      Weight:      SpO2: 94% 97% 99% 99%  Calculated BMI: Body mass index is 28.64 kg/(m^2).  Description of Procedure Process:   Time-out: "Time-out" completed before starting procedure, as per protocol. Position: Prone Target Area: For Lumbar Facet blocks, the target is the groove formed by the junction of the transverse process and superior articular process. For the L5 dorsal ramus, the target is the notch between superior articular process and sacral ala. For the S1 dorsal ramus, the target is the superior and lateral edge of the posterior S1 Sacral foramen. Approach: Paramedial approach. Area Prepped: Entire Posterior Lumbosacral Region Prepping solution: ChloraPrep (2% chlorhexidine gluconate and 70% isopropyl alcohol) Safety Precautions: Aspiration looking for blood return was conducted prior to all injections. At no point did we inject any substances, as a needle was being advanced. No attempts were made at seeking any paresthesias. Safe injection practices and needle disposal techniques used. Medications properly checked for expiration dates. SDV (single dose vial) medications used. Latex Allergy precautions taken.   Description of the Procedure:  Protocol guidelines were followed. The patient was placed in position over the fluoroscopy table. The target area was identified and the area prepped in the usual manner. Skin desensitized using vapocoolant spray. Skin & deeper tissues infiltrated with local anesthetic. Appropriate amount of time allowed to pass for local anesthetics  to take effect. The procedure needle was introduced through the skin, ipsilateral to the reported pain, and advanced to the target area. Employing the "Medial Branch Technique", the needles were advanced to the angle made by the superior and medial portion of the transverse process, and the lateral and inferior portion of the superior articulating process of the targeted vertebral bodies. This area is known as "Burton's Eye" or the "Eye of the [place] Dog". A procedure needle was introduced through the skin, and this time advanced to the angle made by the superior and medial border of the sacral ala, and the lateral border of the S1 vertebral body. This last needle was later repositioned at the superior and lateral border of the posterior S1 foramen. Negative aspiration confirmed. Solution injected in intermittent fashion, asking for systemic symptoms every 0.5cc of injectate. The needles were then removed and the area cleansed, making sure to leave some of the prepping solution back to take advantage of its long term bactericidal properties. EBL: None Materials & Medications Used:  Needle(s) Used: 22g - 3.5" Spinal Needle(s)  Imaging Guidance:   Type of Imaging Technique: Fluoroscopy Guidance (Spinal) Indication(s): Assistance in needle guidance and placement for procedures requiring needle placement in or near specific anatomical locations not easily accessible without such assistance. Exposure Time: Please see nurses notes. Contrast: None required. Fluoroscopic Guidance: I was personally present in the fluoroscopy suite, where the patient was placed in position for the  procedure, over the fluoroscopy-compatible table. Fluoroscopy was manipulated, using "Tunnel Vision Technique", to obtain the best possible view of the target area, on the affected side. Parallax error was corrected before commencing the procedure. A "direction-depth-direction" technique was used to introduce the needle under continuous pulsed fluoroscopic guidance. Once the target was reached, antero-posterior, oblique, and lateral fluoroscopic projection views were taken to confirm needle placement in all planes. Permanently recorded images stored by scanning into EMR. Interpretation: Intraoperative imaging interpretation by performing Physician. Adequate needle placement confirmed. Adequate needle placement confirmed in AP, lateral, & Oblique Views. No contrast injected.  Antibiotic Prophylaxis:  Indication(s): No indications identified. Type:  Antibiotics Given (last 72 hours)    None       Post-operative Assessment:   Complications: No immediate post-treatment complications were observed. Disposition: Return to clinic for follow-up evaluation. The patient tolerated the entire procedure well. A repeat set of vitals were taken after the procedure and the patient was kept under observation following institutional policy, for this procedure. Post-procedural neurological assessment was performed, showing return to baseline, prior to discharge. The patient was discharged home, once institutional criteria were met. The patient was provided with post-procedure discharge instructions, including a section on how to identify potential problems. Should any problems arise concerning this procedure, the patient was given instructions to immediately contact us, at any time, without hesitation. In any case, we plan to contact the patient by telephone for a follow-up status report regarding this interventional procedure. Comments:  No additional relevant information.  Medications administered during this  visit: We administered ropivacaine (PF) 2 mg/ml (0.2%), triamcinolone acetonide, fentaNYL, and midazolam.  Prescriptions ordered during this visit: New Prescriptions   No medications on file    Future Appointments Date Time Provider [place]  01/21/2016 10:20 AM Milinda Pointer, MD [facility] None    Primary Care Physician: [facility], Chrissie Noa, MD Location: [facility] Note by: Kathlen Brunswick. Dossie Arbour, M.D, DABA, DABAPM, DABPM, DABIPP, FIPP   Illustration of the posterior view of the lumbar spine  and the posterior neural structures. Laminae of L2 through S1 are labeled. DPRL5, dorsal primary ramus of L5; DPRS1, dorsal primary ramus of S1; DPR3, dorsal primary ramus of L3; FJ, facet (zygapophyseal) joint L3-L4; I, inferior articular process of L4; LB1, lateral branch of dorsal primary ramus of L1; IAB, inferior articular branches from L3 medial branch (supplies L4-L5 facet joint); IBP, intermediate branch plexus; MB3, medial branch of dorsal primary ramus of L3; NR3, third lumbar nerve root; S, superior articular process of L5; SAB, superior articular branches from L4 (supplies L4-5 facet joint also); TP3, transverse process of L3.  Disclaimer:  Medicine is not an Chief Strategy Officer. The only guarantee in medicine is that nothing is guaranteed. It is important to note that the decision to proceed with this intervention was based on the information collected from the patient. The Data and conclusions were drawn from the patient's questionnaire, the interview, and the physical examination. Because the information was provided in large part by the patient, it cannot be guaranteed that it has not been purposely or unconsciously manipulated. Every effort has been made to obtain as much relevant data as possible for this evaluation. It is important to note that the conclusions that lead to this procedure are derived in large part from the available data. Always take into  account that the treatment will also be dependent on availability of resources and existing treatment guidelines, considered by other Pain Management Practitioners as being common knowledge and practice, at the time of the intervention. For Medico-Legal purposes, it is also important to point out that variation in procedural techniques and pharmacological choices are the acceptable norm. The indications, contraindications, technique, and results of the above procedure should only be interpreted and judged by a Board-Certified Interventional Pain Specialist with extensive familiarity and expertise in the same exact procedure and technique. Attempts at providing opinions without similar or greater experience and expertise than that of the treating physician will be considered as inappropriate and unethical, and shall result in a formal complaint to the state medical board and applicable specialty societies.

## 2016-01-09 ENCOUNTER — Telehealth: Payer: Self-pay

## 2016-01-09 NOTE — Telephone Encounter (Signed)
Post procedure phone call.   No answer.  

## 2016-01-21 ENCOUNTER — Ambulatory Visit: Payer: BLUE CROSS/BLUE SHIELD | Admitting: Pain Medicine

## 2016-02-16 ENCOUNTER — Ambulatory Visit: Payer: BLUE CROSS/BLUE SHIELD | Attending: Pain Medicine | Admitting: Pain Medicine

## 2016-02-16 ENCOUNTER — Encounter: Payer: Self-pay | Admitting: Pain Medicine

## 2016-02-16 VITALS — BP 131/88 | HR 80 | Temp 98.1°F | Ht 69.0 in | Wt 190.0 lb

## 2016-02-16 DIAGNOSIS — M720 Palmar fascial fibromatosis [Dupuytren]: Secondary | ICD-10-CM | POA: Diagnosis not present

## 2016-02-16 DIAGNOSIS — F329 Major depressive disorder, single episode, unspecified: Secondary | ICD-10-CM | POA: Diagnosis not present

## 2016-02-16 DIAGNOSIS — Z8546 Personal history of malignant neoplasm of prostate: Secondary | ICD-10-CM | POA: Diagnosis not present

## 2016-02-16 DIAGNOSIS — K5903 Drug induced constipation: Secondary | ICD-10-CM | POA: Insufficient documentation

## 2016-02-16 DIAGNOSIS — G4730 Sleep apnea, unspecified: Secondary | ICD-10-CM | POA: Insufficient documentation

## 2016-02-16 DIAGNOSIS — M25512 Pain in left shoulder: Secondary | ICD-10-CM | POA: Insufficient documentation

## 2016-02-16 DIAGNOSIS — K219 Gastro-esophageal reflux disease without esophagitis: Secondary | ICD-10-CM | POA: Insufficient documentation

## 2016-02-16 DIAGNOSIS — M25551 Pain in right hip: Secondary | ICD-10-CM | POA: Insufficient documentation

## 2016-02-16 DIAGNOSIS — M25511 Pain in right shoulder: Secondary | ICD-10-CM | POA: Diagnosis not present

## 2016-02-16 DIAGNOSIS — M549 Dorsalgia, unspecified: Secondary | ICD-10-CM | POA: Diagnosis present

## 2016-02-16 DIAGNOSIS — Z85828 Personal history of other malignant neoplasm of skin: Secondary | ICD-10-CM | POA: Insufficient documentation

## 2016-02-16 DIAGNOSIS — M546 Pain in thoracic spine: Secondary | ICD-10-CM | POA: Diagnosis not present

## 2016-02-16 DIAGNOSIS — M47816 Spondylosis without myelopathy or radiculopathy, lumbar region: Secondary | ICD-10-CM | POA: Diagnosis not present

## 2016-02-16 DIAGNOSIS — Z9889 Other specified postprocedural states: Secondary | ICD-10-CM | POA: Diagnosis not present

## 2016-02-16 DIAGNOSIS — I428 Other cardiomyopathies: Secondary | ICD-10-CM | POA: Diagnosis not present

## 2016-02-16 DIAGNOSIS — G629 Polyneuropathy, unspecified: Secondary | ICD-10-CM | POA: Diagnosis not present

## 2016-02-16 DIAGNOSIS — E785 Hyperlipidemia, unspecified: Secondary | ICD-10-CM | POA: Diagnosis not present

## 2016-02-16 DIAGNOSIS — M545 Low back pain: Secondary | ICD-10-CM | POA: Insufficient documentation

## 2016-02-16 DIAGNOSIS — M25559 Pain in unspecified hip: Secondary | ICD-10-CM | POA: Diagnosis present

## 2016-02-16 DIAGNOSIS — E7800 Pure hypercholesterolemia, unspecified: Secondary | ICD-10-CM | POA: Insufficient documentation

## 2016-02-16 DIAGNOSIS — R531 Weakness: Secondary | ICD-10-CM | POA: Insufficient documentation

## 2016-02-16 DIAGNOSIS — Z7982 Long term (current) use of aspirin: Secondary | ICD-10-CM | POA: Diagnosis not present

## 2016-02-16 DIAGNOSIS — M1990 Unspecified osteoarthritis, unspecified site: Secondary | ICD-10-CM | POA: Insufficient documentation

## 2016-02-16 DIAGNOSIS — M25552 Pain in left hip: Secondary | ICD-10-CM | POA: Insufficient documentation

## 2016-02-16 DIAGNOSIS — F418 Other specified anxiety disorders: Secondary | ICD-10-CM | POA: Insufficient documentation

## 2016-02-16 DIAGNOSIS — G8929 Other chronic pain: Secondary | ICD-10-CM | POA: Insufficient documentation

## 2016-02-16 DIAGNOSIS — M542 Cervicalgia: Secondary | ICD-10-CM | POA: Insufficient documentation

## 2016-02-16 DIAGNOSIS — M25561 Pain in right knee: Secondary | ICD-10-CM | POA: Insufficient documentation

## 2016-02-16 DIAGNOSIS — Z5181 Encounter for therapeutic drug level monitoring: Secondary | ICD-10-CM

## 2016-02-16 DIAGNOSIS — M47812 Spondylosis without myelopathy or radiculopathy, cervical region: Secondary | ICD-10-CM

## 2016-02-16 DIAGNOSIS — K5790 Diverticulosis of intestine, part unspecified, without perforation or abscess without bleeding: Secondary | ICD-10-CM | POA: Diagnosis not present

## 2016-02-16 DIAGNOSIS — M4802 Spinal stenosis, cervical region: Secondary | ICD-10-CM | POA: Insufficient documentation

## 2016-02-16 DIAGNOSIS — Z79891 Long term (current) use of opiate analgesic: Secondary | ICD-10-CM | POA: Insufficient documentation

## 2016-02-16 MED ORDER — OXYCODONE HCL 5 MG PO TABS: 5.0000 mg | ORAL_TABLET | ORAL | Status: DC | PRN

## 2016-02-16 NOTE — Patient Instructions (Signed)
GENERAL RISKS AND COMPLICATIONS  What are the risk, side effects and possible complications? Generally speaking, most procedures are safe.  However, with any procedure there are risks, side effects, and the possibility of complications.  The risks and complications are dependent upon the sites that are lesioned, or the type of nerve block to be performed.  The closer the procedure is to the spine, the more serious the risks are.  Great care is taken when placing the radio frequency needles, block needles or lesioning probes, but sometimes complications can occur. 1. Infection: Any time there is an injection through the skin, there is a risk of infection.  This is why sterile conditions are used for these blocks.  There are four possible types of infection. 1. Localized skin infection. 2. Central Nervous System Infection-This can be in the form of Meningitis, which can be deadly. 3. Epidural Infections-This can be in the form of an epidural abscess, which can cause pressure inside of the spine, causing compression of the spinal cord with subsequent paralysis. This would require an emergency surgery to decompress, and there are no guarantees that the patient would recover from the paralysis. 4. Discitis-This is an infection of the intervertebral discs.  It occurs in about 1% of discography procedures.  It is difficult to treat and it may lead to surgery.        2. Pain: the needles have to go through skin and soft tissues, will cause soreness.       3. Damage to internal structures:  The nerves to be lesioned may be near blood vessels or    other nerves which can be potentially damaged.       4. Bleeding: Bleeding is more common if the patient is taking blood thinners such as  aspirin, Coumadin, Ticiid, Plavix, etc., or if he/she have some genetic predisposition  such as hemophilia. Bleeding into the spinal canal can cause compression of the spinal  cord with subsequent paralysis.  This would require an  emergency surgery to  decompress and there are no guarantees that the patient would recover from the  paralysis.       5. Pneumothorax:  Puncturing of a lung is a possibility, every time a needle is introduced in  the area of the chest or upper back.  Pneumothorax refers to free air around the  collapsed lung(s), inside of the thoracic cavity (chest cavity).  Another two possible  complications related to a similar event would include: Hemothorax and Chylothorax.   These are variations of the Pneumothorax, where instead of air around the collapsed  lung(s), you may have blood or chyle, respectively.       6. Spinal headaches: They may occur with any procedures in the area of the spine.       7. Persistent CSF (Cerebro-Spinal Fluid) leakage: This is a rare problem, but may occur  with prolonged intrathecal or epidural catheters either due to the formation of a fistulous  track or a dural tear.       8. Nerve damage: By working so close to the spinal cord, there is always a possibility of  nerve damage, which could be as serious as a permanent spinal cord injury with  paralysis.       9. Death:  Although rare, severe deadly allergic reactions known as "Anaphylactic  reaction" can occur to any of the medications used.      10. Worsening of the symptoms:  We can always make thing worse.    What are the chances of something like this happening? Chances of any of this occuring are extremely low.  By statistics, you have more of a chance of getting killed in a motor vehicle accident: while driving to the hospital than any of the above occurring .  Nevertheless, you should be aware that they are possibilities.  In general, it is similar to taking a shower.  Everybody knows that you can slip, hit your head and get killed.  Does that mean that you should not shower again?  Nevertheless always keep in mind that statistics do not mean anything if you happen to be on the wrong side of them.  Even if a procedure has a 1  (one) in a 1,000,000 (million) chance of going wrong, it you happen to be that one..Also, keep in mind that by statistics, you have more of a chance of having something go wrong when taking medications.  Who should not have this procedure? If you are on a blood thinning medication (e.g. Coumadin, Plavix, see list of "Blood Thinners"), or if you have an active infection going on, you should not have the procedure.  If you are taking any blood thinners, please inform your physician.  How should I prepare for this procedure?  Do not eat or drink anything at least six hours prior to the procedure.  Bring a driver with you .  It cannot be a taxi.  Come accompanied by an adult that can drive you back, and that is strong enough to help you if your legs get weak or numb from the local anesthetic.  Take all of your medicines the morning of the procedure with just enough water to swallow them.  If you have diabetes, make sure that you are scheduled to have your procedure done first thing in the morning, whenever possible.  If you have diabetes, take only half of your insulin dose and notify our nurse that you have done so as soon as you arrive at the clinic.  If you are diabetic, but only take blood sugar pills (oral hypoglycemic), then do not take them on the morning of your procedure.  You may take them after you have had the procedure.  Do not take aspirin or any aspirin-containing medications, at least eleven (11) days prior to the procedure.  They may prolong bleeding.  Wear loose fitting clothing that may be easy to take off and that you would not mind if it got stained with Betadine or blood.  Do not wear any jewelry or perfume  Remove any nail coloring.  It will interfere with some of our monitoring equipment.  NOTE: Remember that this is not meant to be interpreted as a complete list of all possible complications.  Unforeseen problems may occur.  BLOOD THINNERS The following drugs  contain aspirin or other products, which can cause increased bleeding during surgery and should not be taken for 2 weeks prior to and 1 week after surgery.  If you should need take something for relief of minor pain, you may take acetaminophen which is found in Tylenol,m Datril, Anacin-3 and Panadol. It is not blood thinner. The products listed below are.  Do not take any of the products listed below in addition to any listed on your instruction sheet.  A.P.C or A.P.C with Codeine Codeine Phosphate Capsules #3 Ibuprofen Ridaura  ABC compound Congesprin Imuran rimadil  Advil Cope Indocin Robaxisal  Alka-Seltzer Effervescent Pain Reliever and Antacid Coricidin or Coricidin-D  Indomethacin Rufen    Alka-Seltzer plus Cold Medicine Cosprin Ketoprofen S-A-C Tablets  Anacin Analgesic Tablets or Capsules Coumadin Korlgesic Salflex  Anacin Extra Strength Analgesic tablets or capsules CP-2 Tablets Lanoril Salicylate  Anaprox Cuprimine Capsules Levenox Salocol  Anexsia-D Dalteparin Magan Salsalate  Anodynos Darvon compound Magnesium Salicylate Sine-off  Ansaid Dasin Capsules Magsal Sodium Salicylate  Anturane Depen Capsules Marnal Soma  APF Arthritis pain formula Dewitt's Pills Measurin Stanback  Argesic Dia-Gesic Meclofenamic Sulfinpyrazone  Arthritis Bayer Timed Release Aspirin Diclofenac Meclomen Sulindac  Arthritis pain formula Anacin Dicumarol Medipren Supac  Analgesic (Safety coated) Arthralgen Diffunasal Mefanamic Suprofen  Arthritis Strength Bufferin Dihydrocodeine Mepro Compound Suprol  Arthropan liquid Dopirydamole Methcarbomol with Aspirin Synalgos  ASA tablets/Enseals Disalcid Micrainin Tagament  Ascriptin Doan's Midol Talwin  Ascriptin A/D Dolene Mobidin Tanderil  Ascriptin Extra Strength Dolobid Moblgesic Ticlid  Ascriptin with Codeine Doloprin or Doloprin with Codeine Momentum Tolectin  Asperbuf Duoprin Mono-gesic Trendar  Aspergum Duradyne Motrin or Motrin IB Triminicin  Aspirin  plain, buffered or enteric coated Durasal Myochrisine Trigesic  Aspirin Suppositories Easprin Nalfon Trillsate  Aspirin with Codeine Ecotrin Regular or Extra Strength Naprosyn Uracel  Atromid-S Efficin Naproxen Ursinus  Auranofin Capsules Elmiron Neocylate Vanquish  Axotal Emagrin Norgesic Verin  Azathioprine Empirin or Empirin with Codeine Normiflo Vitamin E  Azolid Emprazil Nuprin Voltaren  Bayer Aspirin plain, buffered or children's or timed BC Tablets or powders Encaprin Orgaran Warfarin Sodium  Buff-a-Comp Enoxaparin Orudis Zorpin  Buff-a-Comp with Codeine Equegesic Os-Cal-Gesic   Buffaprin Excedrin plain, buffered or Extra Strength Oxalid   Bufferin Arthritis Strength Feldene Oxphenbutazone   Bufferin plain or Extra Strength Feldene Capsules Oxycodone with Aspirin   Bufferin with Codeine Fenoprofen Fenoprofen Pabalate or Pabalate-SF   Buffets II Flogesic Panagesic   Buffinol plain or Extra Strength Florinal or Florinal with Codeine Panwarfarin   Buf-Tabs Flurbiprofen Penicillamine   Butalbital Compound Four-way cold tablets Penicillin   Butazolidin Fragmin Pepto-Bismol   Carbenicillin Geminisyn Percodan   Carna Arthritis Reliever Geopen Persantine   Carprofen Gold's salt Persistin   Chloramphenicol Goody's Phenylbutazone   Chloromycetin Haltrain Piroxlcam   Clmetidine heparin Plaquenil   Cllnoril Hyco-pap Ponstel   Clofibrate Hydroxy chloroquine Propoxyphen         Before stopping any of these medications, be sure to consult the physician who ordered them.  Some, such as Coumadin (Warfarin) are ordered to prevent or treat serious conditions such as "deep thrombosis", "pumonary embolisms", and other heart problems.  The amount of time that you may need off of the medication may also vary with the medication and the reason for which you were taking it.  If you are taking any of these medications, please make sure you notify your pain physician before you undergo any  procedures.         Facet Blocks Patient Information  Description: The facets are joints in the spine between the vertebrae.  Like any joints in the body, facets can become irritated and painful.  Arthritis can also effect the facets.  By injecting steroids and local anesthetic in and around these joints, we can temporarily block the nerve supply to them.  Steroids act directly on irritated nerves and tissues to reduce selling and inflammation which often leads to decreased pain.  Facet blocks may be done anywhere along the spine from the neck to the low back depending upon the location of your pain.   After numbing the skin with local anesthetic (like Novocaine), a small needle is passed onto the facet joints under x-ray guidance.    You may experience a sensation of pressure while this is being done.  The entire block usually lasts about 15-25 minutes.   Conditions which may be treated by facet blocks:   Low back/buttock pain  Neck/shoulder pain  Certain types of headaches  Preparation for the injection:  1. Do not eat any solid food or dairy products within 8 hours of your appointment. 2. You may drink clear liquid up to 3 hours before appointment.  Clear liquids include water, black coffee, juice or soda.  No milk or cream please. 3. You may take your regular medication, including pain medications, with a sip of water before your appointment.  Diabetics should hold regular insulin (if taken separately) and take 1/2 normal NPH dose the morning of the procedure.  Carry some sugar containing items with you to your appointment. 4. A driver must accompany you and be prepared to drive you home after your procedure. 5. Bring all your current medications with you. 6. An IV may be inserted and sedation may be given at the discretion of the physician. 7. A blood pressure cuff, EKG and other monitors will often be applied during the procedure.  Some patients may need to have extra oxygen  administered for a short period. 8. You will be asked to provide medical information, including your allergies and medications, prior to the procedure.  We must know immediately if you are taking blood thinners (like Coumadin/Warfarin) or if you are allergic to IV iodine contrast (dye).  We must know if you could possible be pregnant.  Possible side-effects:   Bleeding from needle site  Infection (rare, may require surgery)  Nerve injury (rare)  Numbness & tingling (temporary)  Difficulty urinating (rare, temporary)  Spinal headache (a headache worse with upright posture)  Light-headedness (temporary)  Pain at injection site (serveral days)  Decreased blood pressure (rare, temporary)  Weakness in arm/leg (temporary)  Pressure sensation in back/neck (temporary)   Call if you experience:   Fever/chills associated with headache or increased back/neck pain  Headache worsened by an upright position  New onset, weakness or numbness of an extremity below the injection site  Hives or difficulty breathing (go to the emergency room)  Inflammation or drainage at the injection site(s)  Severe back/neck pain greater than usual  New symptoms which are concerning to you  Please note:  Although the local anesthetic injected can often make your back or neck feel good for several hours after the injection, the pain will likely return. It takes 3-7 days for steroids to work.  You may not notice any pain relief for at least one week.  If effective, we will often do a series of 2-3 injections spaced 3-6 weeks apart to maximally decrease your pain.  After the initial series, you may be a candidate for a more permanent nerve block of the facets.  If you have any questions, please call [phone] [facility] 

## 2016-02-16 NOTE — Progress Notes (Signed)
Patient's Name: Ricky Mercer  Patient type: Established  MRN: [id number]  Service setting: Ambulatory outpatient  DOB: 1951/05/16  Location: [facility]  DOS: 02/16/2016  Primary Care Physician: Sofie Hartigan, MD  Note by: Kathlen Brunswick. Dossie Arbour, M.D, DABA, DABAPM, DABPM, DABIPP, FIPP  Referring Physician: Sofie Hartigan, MD  Specialty: Board-Certified Interventional Pain Management  Last Visit to Pain Management: 01/09/2016   Primary Reason(s) for Visit: Encounter for prescription drug management & post-procedure evaluation of chronic illness with mild to moderate exacerbation(Level of risk: moderate) CC: Back Pain and Hip Pain   HPI  Ricky Mercer is a 65 y.o. year old, male patient, who returns today as an established patient. He has Hand weakness; Polyradiculopathy; Chronic pain; Long term current use of opiate analgesic; Long term prescription opiate use; Opiate use (45 MME/Day); Opiate dependence ([place]); Encounter for therapeutic drug level monitoring; Chronic low back pain  (Location of Primary Pain) (Bilateral) (L>R); Chronic lower extremity pain (Location of Tertiary source of pain) (Bilateral) (L>R); Chronic lumbar radicular pain (Bilateral) (L>R) (Bilateral S1 dermatomal distribution); Chronic hip pain (Location of Secondary source of pain) (Bilateral) (L>R); Chronic upper back pain (Bilateral) (L>R); Chronic neck pain (neck<shoulder) (L>R); Chronic foot pain; Chronic knee pain (Right); Cervical spondylosis; 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); Chronic upper extremity pain (Bilateral) (L>R); Lumbar spondylosis; Cervical facet hypertrophy; Cervical facet syndrome (R>L); History of prostate cancer; Radicular pain of shoulder; Class I Morbid obesity (HCC) (68% higher incidence of chronic low back problems); Peripheral neuropathy (HCC) (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); Lumbar facet syndrome (Location of Primary Source of  Pain) (Bilateral) (L>R); Sleep apnea; Depression; High cholesterol; Low testosterone; History of surgical procedure; Therapeutic opioid-induced constipation (OIC); Therapeutic opioid induced constipation; Chronic shoulder pain (Bilateral) (L>R); Breathing orally; Chest pain; Primary cardiomyopathy ([place]); and Chronic sacroiliac joint pain (Location of Primary Source of Pain) (Bilateral) (L>R) on his problem list.. His primarily concern today is the Back Pain and Hip Pain   Pain Assessment: Self-Reported Pain Score: 4  Reported level is compatible with observation Pain Type: Chronic pain Pain Location: Back Pain Orientation: Lower Pain Descriptors / Indicators: Aching, Sharp, Constant Pain Frequency: Constant  The patient comes into the clinics today for post-procedure evaluation on the interventional treatment done on 01/08/2016. In addition, he comes in today for pharmacological management of his chronic pain.  The patient  reports that he does not use illicit drugs.  Date of Last Visit: 01/08/16 Service Provided on Last Visit: Procedure (BLF)  Controlled Substance Pharmacotherapy Assessment & REMS (Risk Evaluation and Mitigation Strategy)  Analgesic: Oxycodone IR 10 mg every 8 hours (30 mg/day) Pill Count: Oxycodone pill count # 0/90 Filled 12-24-15. Patient had to reschedule last medrefill appointment and made his medicine last until today. Took last oxycodone yesterday. MME/day: 45 mg/day.  Pharmacokinetics: Onset of action (Liberation/Absorption): Within expected pharmacological parameters Time to Peak effect (Distribution): Timing and results are as within normal expected parameters Duration of action (Metabolism/Excretion): Within normal limits for medication (4 hours) Pharmacodynamics: Analgesic Effect: More than 50% Activity Facilitation: Medication(s) allow patient to sit, stand, walk, and do the basic ADLs Perceived Effectiveness: Described as relatively effective, allowing for  increase in activities of daily living (ADL) Side-effects or Adverse reactions: None reported Monitoring: [place] PMP: Online review of the past 21-month period conducted. Compliant with practice rules and regulations Last UDS on record: TOXASSURE SELECT 13  Date Value Ref Range Status  12/23/2015 FINAL  Final  Comment:    ==================================================================== TOXASSURE SELECT 13 (MW) ==================================================================== Test                             Result       Flag       Units Drug Present and Declared for Prescription Verification   Oxycodone                      77           EXPECTED   ng/mg creat   Oxymorphone                    886          EXPECTED   ng/mg creat   Noroxycodone                   404          EXPECTED   ng/mg creat   Noroxymorphone                 329          EXPECTED   ng/mg creat    Sources of oxycodone are scheduled prescription medications.    Oxymorphone, noroxycodone, and noroxymorphone are expected    metabolites of oxycodone. Oxymorphone is also available as a    scheduled prescription medication. ==================================================================== Test                      Result    Flag   Units      Ref Range   Creatinine              160              mg/dL      >=20 ==================================================================== Declared Medications:  The flagging and interpretation on this report are based on the  following declared medications.  Unexpected results may arise from  inaccuracies in the declared medications.  **Note: The testing scope of this panel includes these medications:  Oxycodone  **Note: The testing scope of this panel does not include following  reported medications:  Acetaminophen  Aspirin  Bisacodyl  Citalopram  Diphenhydramine  Docusate  Pantoprazole  Simvastatin  Supplement   Testosterone ==================================================================== For clinical consultation, please call [phone]. ====================================================================    UDS interpretation: Compliant Medication Assessment Form: Reviewed. Patient indicates being compliant with therapy Treatment compliance: Compliant Risk Assessment: Aberrant Behavior: None observed today Substance Use Disorder (SUD) Risk Level: Low Risk of opioid abuse or dependence: 0.7-3.0% with doses ? 36 MME/day and 6.1-26% with doses ? 120 MME/day. Opioid Risk Tool (ORT) Score: Total Score: 0 Low Risk for SUD (Score <3) Depression Scale Score: PHQ-2: PHQ-2 Total Score: 0 No depression (0) PHQ-9: PHQ-9 Total Score: 0 No depression (0-4)  Pharmacologic Plan: Today I will change his formulation from 10 mg 3 times a day to 5 mg 6 times a day since the 10 mg pills last only 4 hours.  Post-Procedure Assessment  Procedure done on last visit: Diagnostic bilateral lumbar facet block under fluoroscopic guidance and IV sedation. Side-effects or Adverse reactions: None reported Sedation: Sedation given  Results: Ultra-Short Term Relief (First 1 hour after procedure): 75 %  Analgesia during this period is likely to be Local Anesthetic and/or IV Sedative (Analgesic/Anxiolitic) related Short Term Relief (Initial 4-6 hrs after procedure): 75 % Complete relief confirms area to be the  source of pain Long Term Relief : 0 % No benefit could suggest etiology to be non-inflammatory, possibly compressive   Current Relief (Now): 0%  Recurrance of pain could suggest persistent aggravating factors Interpretation of Results: Based on the results of this injection, we need to repeat the procedure and if he continues to get similar results, consider radiofrequency ablation. At this point, the patient has asked [place] to concentrate on his neck which seems to be now hurting more than the lower  back.  Laboratory Chemistry  Inflammation Markers Lab Results  Component Value Date   ESRSEDRATE 4 08/21/2015   CRP <0.5 08/21/2015    Renal Function Lab Results  Component Value Date   BUN 13 11/25/2015   CREATININE 0.96 11/25/2015   GFRAA >60 11/25/2015   GFRNONAA >60 11/25/2015    Hepatic Function Lab Results  Component Value Date   AST 31 11/25/2015   ALT 41 11/25/2015   ALBUMIN 3.9 11/25/2015    Electrolytes Lab Results  Component Value Date   NA 138 11/25/2015   K 4.2 11/25/2015   CL 104 11/25/2015   CALCIUM 9.0 11/25/2015   MG 2.1 08/21/2015    Pain Modulating Vitamins No results found for: [place], VD125OH2TOT, [id number], [id number], VITAMINB12  Coagulation Parameters Lab Results  Component Value Date   PLT 238 11/25/2015    Note: Labs reviewed. Results made available to patient  Recent Diagnostic Imaging  No results found.  Meds  The patient has a current medication list which includes the following prescription(s): androgel pump, aspirin, bisacodyl, citalopram, diphenhydramine-acetaminophen, docusate sodium, pantoprazole, simvastatin, benefiber, and oxycodone.  Current Outpatient Prescriptions on File Prior to Visit  Medication Sig  . ANDROGEL PUMP 20.25 MG/ACT (1.62%) GEL APPLY TO 2 PUMPS ONCE A DAY AS DIRECTED  . aspirin 81 MG tablet Take 81 mg by mouth daily.  . bisacodyl (DULCOLAX) 5 MG EC tablet Take 2 tablets (10 mg total) by mouth daily as needed for moderate constipation.  . citalopram (CELEXA) 10 MG tablet Take 10 mg by mouth daily.  . diphenhydramine-acetaminophen (TYLENOL PM EXTRA STRENGTH) 25-500 MG TABS tablet Take 1 tablet by mouth at bedtime as needed.  . docusate sodium (COLACE) 100 MG capsule Take 1 capsule (100 mg total) by mouth 2 (two) times daily. Do not use longer than 7 days. (Patient taking differently: Take 100 mg by mouth 2 (two) times daily as needed. Do not use longer than 7 days.)  . pantoprazole (PROTONIX) 40 MG  tablet Take 40 mg by mouth daily.  . simvastatin (ZOCOR) 10 MG tablet Take 10 mg by mouth at bedtime.   . Wheat Dextrin (BENEFIBER) POWD Stir 2 tsp. TID into 4-8 oz of any non-carbonated beverage or soft food (hot or cold)   No current facility-administered medications on file prior to visit.    ROS  Constitutional: Denies any fever or chills Gastrointestinal: No reported hemesis, hematochezia, vomiting, or acute GI distress Musculoskeletal: Denies any acute onset joint swelling, redness, loss of ROM, or weakness Neurological: No reported episodes of acute onset apraxia, aphasia, dysarthria, agnosia, amnesia, paralysis, loss of coordination, or loss of consciousness  Allergies  Ricky Mercer has No Known Allergies.  [place]  Medical:  Ricky Mercer  has a past medical history of Hyperlipidemia; GERD (gastroesophageal reflux disease); Prostate cancer ([place]); Seasonal allergies; Situational anxiety; Diverticulosis; Dupuytren's contracture; Knee pain; Midline low back pain; Pain in neck; Foot pain, bilateral; Bilateral arm pain; Hand weakness; Osteoarthritis; Sleep apnea; Cardiomyopathy ([place]); and Reactive airway  disease. Family: family history includes Colon cancer in his brother; Hyperlipidemia in his sister; Lung cancer in his father; Prostate cancer in his brother. Surgical:  has past surgical history that includes Nose surgery; Hand surgery (Bilateral); Colonoscopy (10/24/2012); Upper gi endoscopy (12/18/2012); Tonsillectomy (2009); Skin cancer excision (Left, 2015); Mass excision (Right, 07/18/2015); Esophagogastroduodenoscopy (egd) with propofol (N/A, 10/28/2015); prostate seeding; Hernia repair; and Cholecystectomy (N/A, 12/02/2015). Tobacco:  reports that he has never smoked. He has never used smokeless tobacco. Alcohol:  reports that he does not drink alcohol. Drug:  reports that he does not use illicit drugs.  Constitutional Exam  Vitals: Blood pressure 131/88, pulse 80, temperature 98.1 F  (36.7 C), height 5\' 9"  (1.753 m), weight 190 lb (86.183 kg), SpO2 99 %. General appearance: Well nourished, well developed, and well hydrated. In no acute distress Calculated BMI/Body habitus: Body mass index is 28.05 kg/(m^2). (25-29.9 kg/m2) Overweight - 20% higher incidence of chronic pain Psych/Mental status: Alert and oriented x 3 (person, place, & time) Eyes: PERLA Respiratory: No evidence of acute respiratory distress  Cervical Spine Exam  Inspection: No masses, redness, or swelling Alignment: Symmetrical ROM: Functional: Decreased ROM Stability: No instability detected Muscle strength & Tone: Functionally intact Sensory: Unimpaired Palpation: Tender  Upper Extremity (UE) Exam    Side: Right upper extremity  Side: Left upper extremity  Inspection: No masses, redness, swelling, or asymmetry  Inspection: No masses, redness, swelling, or asymmetry  ROM:  ROM:  Functional: ROM is within functional limits [facility])  Functional: ROM is within functional limits [facility])  Muscle strength & Tone: Functionally intact  Muscle strength & Tone: Functionally intact  Sensory: Unimpaired  Sensory: Unimpaired  Palpation: Non-contributory  Palpation: Non-contributory   Thoracic Spine Exam  Inspection: No masses, redness, or swelling Alignment: Symmetrical ROM: Functional: ROM is within functional limits [facility]) Stability: No instability detected Sensory: Unimpaired Muscle strength & Tone: Functionally intact Palpation: No complaints of tenderness  Lumbar Spine Exam  Inspection: No masses, redness, or swelling Alignment: Symmetrical ROM: Functional: Decreased ROM Stability: No instability detected Muscle strength & Tone: Functionally intact Sensory: Unimpaired Palpation: Tender Provocative Tests: Lumbar Hyperextension and rotation test: Positive for bilateral lumbar facet Patrick's Maneuver: deferred  Gait & Posture Assessment  Ambulation: Unassisted Gait: Unaffected Posture:  WNL  Lower Extremity Exam    Side: Right lower extremity  Side: Left lower extremity  Inspection: No masses, redness, swelling, or asymmetry ROM:  Inspection: No masses, redness, swelling, or asymmetry ROM:  Functional: ROM is within functional limits [facility])  Functional: ROM is within functional limits [facility])  Muscle strength & Tone: Functionally intact  Muscle strength & Tone: Functionally intact  Sensory: Unimpaired  Sensory: Unimpaired  Palpation: Non-contributory  Palpation: Non-contributory   Assessment & Plan  Primary Diagnosis & Pertinent Problem List: The primary encounter diagnosis was Chronic pain. Diagnoses of Encounter for therapeutic drug level monitoring, Long term current use of opiate analgesic, Cervical facet syndrome (R>L), Chronic neck pain (neck<shoulder) (L>R), Lumbar facet syndrome (Location of Primary Source of Pain) (Bilateral) (L>R), and Lumbar spondylosis, unspecified spinal osteoarthritis were also pertinent to this visit.  Visit Diagnosis: 1. Chronic pain   2. Encounter for therapeutic drug level monitoring   3. Long term current use of opiate analgesic   4. Cervical facet syndrome (R>L)   5. Chronic neck pain (neck<shoulder) (L>R)   6. Lumbar facet syndrome (Location of Primary Source of Pain) (Bilateral) (L>R)   7. Lumbar spondylosis, unspecified spinal osteoarthritis     Problems updated  and reviewed during this visit: Problem  Cervical facet syndrome (R>L)    Problem-specific Plan(s): No problem-specific assessment & plan notes found for this encounter.  No new assessment & plan notes have been filed under this hospital service since the last note was generated. Service: Pain Management   Plan of Care   Problem List Items Addressed This Visit      High   Cervical facet syndrome (R>L) (Chronic)   Relevant Orders   CERVICAL FACET (MEDIAL BRANCH NERVE BLOCK)    Chronic neck pain (neck<shoulder) (L>R) (Chronic)   Relevant Medications    oxyCODONE (OXY IR/ROXICODONE) 5 MG immediate release tablet   Other Relevant Orders   CERVICAL FACET (MEDIAL BRANCH NERVE BLOCK)    Chronic pain - Primary (Chronic)   Relevant Medications   oxyCODONE (OXY IR/ROXICODONE) 5 MG immediate release tablet   Lumbar facet syndrome (Location of Primary Source of Pain) (Bilateral) (L>R) (Chronic)   Relevant Medications   oxyCODONE (OXY IR/ROXICODONE) 5 MG immediate release tablet   Other Relevant Orders   LUMBAR FACET(MEDIAL BRANCH NERVE BLOCK) MBNB   Lumbar spondylosis (Chronic)   Relevant Medications   oxyCODONE (OXY IR/ROXICODONE) 5 MG immediate release tablet   Other Relevant Orders   LUMBAR FACET(MEDIAL BRANCH NERVE BLOCK) MBNB     Medium   Encounter for therapeutic drug level monitoring   Long term current use of opiate analgesic (Chronic)       Pharmacotherapy (Medications Ordered): Meds ordered this encounter  Medications  . oxyCODONE (OXY IR/ROXICODONE) 5 MG immediate release tablet    Sig: Take 1 tablet (5 mg total) by mouth every 4 (four) hours as needed for severe pain. Maximum: 6/day    Dispense:  180 tablet    Refill:  0    Do not add this medication to the electronic "Automatic Refill" notification system. Patient may have prescription filled one day early if pharmacy is closed on scheduled refill date. Do not fill until: 02/17/16 To last until:    [facility] & Procedure Ordered: Orders Placed This Encounter  Procedures  . CERVICAL FACET (MEDIAL BRANCH NERVE BLOCK)   . LUMBAR FACET(MEDIAL BRANCH NERVE BLOCK) MBNB    Imaging Ordered: None  Interventional Therapies: Scheduled:  Diagnostic bilateral cervical facet block under fluoroscopic guidance and IV sedation.    Considering:   1. Possible cervical facet radiofrequency ablation.  2. Possible lumbar facet radiofrequency ablation.    PRN Procedures:   Diagnostic bilateral lumbar facet block #2 under fluoroscopic guidance and IV sedation.    Referral(s) or  Consult(s): None at this time.  New Prescriptions   OXYCODONE (OXY IR/ROXICODONE) 5 MG IMMEDIATE RELEASE TABLET    Take 1 tablet (5 mg total) by mouth every 4 (four) hours as needed for severe pain. Maximum: 6/day    Medications administered during this visit: Ricky Mercer had no medications administered during this visit.  Requested PM Follow-up: Return in about 3 weeks (around 03/08/2016) for Procedure (Scheduled), Medication Management, (1-Mo).  No future appointments.  Primary Care Physician: [facility], MD Location: [facility] Note by: Kathlen Brunswick. Dossie Arbour, M.D, DABA, DABAPM, DABPM, DABIPP, FIPP  Pain Score Disclaimer: We use the NRS-11 scale. This is a self-reported, subjective measurement of pain severity with only modest accuracy. It is used primarily to identify changes within a particular patient. It must be understood that outpatient pain scales are significantly less accurate that those used for research, where they can be applied under ideal controlled circumstances  with minimal exposure to variables. In reality, the score is likely to be a combination of pain intensity and pain affect, where pain affect describes the degree of emotional arousal or changes in action readiness caused by the sensory experience of pain. Factors such as social and work situation, setting, emotional state, anxiety levels, expectation, and prior pain experience may influence pain perception and show large inter-individual differences that may also be affected by time variables.  Patient instructions provided at this appointment:: There are no Patient Instructions on file for this visit.

## 2016-02-16 NOTE — Progress Notes (Signed)
Safety precautions to be maintained throughout the outpatient stay will include: orient to surroundings, keep bed in low position, maintain call bell within reach at all times, provide assistance with transfer out of bed and ambulation. Oxycodone pill count # 0/90  Filled 12-24-15.  Patient had to reschedule last medrefill appointment and made his medicine last until today.  Took last oxycodone yesterday.

## 2016-03-09 ENCOUNTER — Telehealth: Payer: Self-pay | Admitting: *Deleted

## 2016-03-09 ENCOUNTER — Ambulatory Visit: Payer: BLUE CROSS/BLUE SHIELD | Admitting: Pain Medicine

## 2016-03-09 NOTE — Telephone Encounter (Signed)
pt called to cancel his appt for today due to he feels ok..gave a med appt 03/18/16 @ 10:20am...td

## 2016-03-18 ENCOUNTER — Ambulatory Visit: Payer: BLUE CROSS/BLUE SHIELD | Admitting: Pain Medicine

## 2016-03-18 ENCOUNTER — Encounter: Payer: Self-pay | Admitting: Pain Medicine

## 2016-03-18 ENCOUNTER — Ambulatory Visit: Payer: BLUE CROSS/BLUE SHIELD | Attending: Pain Medicine | Admitting: Pain Medicine

## 2016-03-18 VITALS — BP 117/87 | HR 72 | Temp 98.3°F | Resp 18 | Ht 69.0 in | Wt 185.0 lb

## 2016-03-18 DIAGNOSIS — E7800 Pure hypercholesterolemia, unspecified: Secondary | ICD-10-CM | POA: Diagnosis not present

## 2016-03-18 DIAGNOSIS — M4802 Spinal stenosis, cervical region: Secondary | ICD-10-CM | POA: Insufficient documentation

## 2016-03-18 DIAGNOSIS — M542 Cervicalgia: Secondary | ICD-10-CM | POA: Diagnosis not present

## 2016-03-18 DIAGNOSIS — M533 Sacrococcygeal disorders, not elsewhere classified: Secondary | ICD-10-CM | POA: Diagnosis not present

## 2016-03-18 DIAGNOSIS — M549 Dorsalgia, unspecified: Secondary | ICD-10-CM | POA: Diagnosis present

## 2016-03-18 DIAGNOSIS — Z85828 Personal history of other malignant neoplasm of skin: Secondary | ICD-10-CM | POA: Diagnosis not present

## 2016-03-18 DIAGNOSIS — Z7982 Long term (current) use of aspirin: Secondary | ICD-10-CM | POA: Insufficient documentation

## 2016-03-18 DIAGNOSIS — M545 Low back pain, unspecified: Secondary | ICD-10-CM

## 2016-03-18 DIAGNOSIS — M791 Myalgia: Secondary | ICD-10-CM

## 2016-03-18 DIAGNOSIS — Z79891 Long term (current) use of opiate analgesic: Secondary | ICD-10-CM

## 2016-03-18 DIAGNOSIS — M4806 Spinal stenosis, lumbar region: Secondary | ICD-10-CM | POA: Diagnosis not present

## 2016-03-18 DIAGNOSIS — T402X5A Adverse effect of other opioids, initial encounter: Secondary | ICD-10-CM

## 2016-03-18 DIAGNOSIS — M25559 Pain in unspecified hip: Secondary | ICD-10-CM | POA: Diagnosis not present

## 2016-03-18 DIAGNOSIS — G4730 Sleep apnea, unspecified: Secondary | ICD-10-CM | POA: Insufficient documentation

## 2016-03-18 DIAGNOSIS — M792 Neuralgia and neuritis, unspecified: Secondary | ICD-10-CM

## 2016-03-18 DIAGNOSIS — Z6827 Body mass index (BMI) 27.0-27.9, adult: Secondary | ICD-10-CM | POA: Insufficient documentation

## 2016-03-18 DIAGNOSIS — I428 Other cardiomyopathies: Secondary | ICD-10-CM | POA: Diagnosis not present

## 2016-03-18 DIAGNOSIS — G629 Polyneuropathy, unspecified: Secondary | ICD-10-CM | POA: Diagnosis not present

## 2016-03-18 DIAGNOSIS — M25512 Pain in left shoulder: Secondary | ICD-10-CM | POA: Diagnosis not present

## 2016-03-18 DIAGNOSIS — K5903 Drug induced constipation: Secondary | ICD-10-CM

## 2016-03-18 DIAGNOSIS — M47812 Spondylosis without myelopathy or radiculopathy, cervical region: Secondary | ICD-10-CM | POA: Diagnosis not present

## 2016-03-18 DIAGNOSIS — M5136 Other intervertebral disc degeneration, lumbar region: Secondary | ICD-10-CM | POA: Diagnosis not present

## 2016-03-18 DIAGNOSIS — M79606 Pain in leg, unspecified: Secondary | ICD-10-CM | POA: Diagnosis not present

## 2016-03-18 DIAGNOSIS — M25561 Pain in right knee: Secondary | ICD-10-CM | POA: Insufficient documentation

## 2016-03-18 DIAGNOSIS — M47816 Spondylosis without myelopathy or radiculopathy, lumbar region: Secondary | ICD-10-CM

## 2016-03-18 DIAGNOSIS — M5412 Radiculopathy, cervical region: Secondary | ICD-10-CM

## 2016-03-18 DIAGNOSIS — Z5181 Encounter for therapeutic drug level monitoring: Secondary | ICD-10-CM

## 2016-03-18 DIAGNOSIS — M7918 Myalgia, other site: Secondary | ICD-10-CM

## 2016-03-18 DIAGNOSIS — M25511 Pain in right shoulder: Secondary | ICD-10-CM | POA: Diagnosis not present

## 2016-03-18 DIAGNOSIS — M79601 Pain in right arm: Secondary | ICD-10-CM

## 2016-03-18 DIAGNOSIS — G8929 Other chronic pain: Secondary | ICD-10-CM | POA: Diagnosis not present

## 2016-03-18 DIAGNOSIS — M5416 Radiculopathy, lumbar region: Secondary | ICD-10-CM

## 2016-03-18 DIAGNOSIS — F329 Major depressive disorder, single episode, unspecified: Secondary | ICD-10-CM | POA: Diagnosis not present

## 2016-03-18 DIAGNOSIS — Z8546 Personal history of malignant neoplasm of prostate: Secondary | ICD-10-CM | POA: Insufficient documentation

## 2016-03-18 DIAGNOSIS — M79602 Pain in left arm: Secondary | ICD-10-CM

## 2016-03-18 DIAGNOSIS — M5382 Other specified dorsopathies, cervical region: Secondary | ICD-10-CM

## 2016-03-18 DIAGNOSIS — F1190 Opioid use, unspecified, uncomplicated: Secondary | ICD-10-CM

## 2016-03-18 MED ORDER — GABAPENTIN 100 MG PO CAPS: 100.0000 mg | ORAL_CAPSULE | Freq: Every day | ORAL | Status: DC

## 2016-03-18 MED ORDER — OXYCODONE HCL 5 MG PO TABS: 5.0000 mg | ORAL_TABLET | ORAL | Status: DC | PRN

## 2016-03-18 MED ORDER — BISACODYL 5 MG PO TBEC: 10.0000 mg | DELAYED_RELEASE_TABLET | Freq: Every day | ORAL | Status: DC | PRN

## 2016-03-18 MED ORDER — CYCLOBENZAPRINE HCL 10 MG PO TABS: 10.0000 mg | ORAL_TABLET | Freq: Three times a day (TID) | ORAL | Status: DC | PRN

## 2016-03-18 MED ORDER — BENEFIBER PO POWD: ORAL | Status: DC

## 2016-03-18 MED ORDER — DOCUSATE SODIUM 100 MG PO CAPS: 100.0000 mg | ORAL_CAPSULE | Freq: Every day | ORAL | Status: DC

## 2016-03-18 NOTE — Progress Notes (Signed)
Safety precautions to be maintained throughout the outpatient stay will include: orient to surroundings, keep bed in low position, maintain call bell within reach at all times, provide assistance with transfer out of bed and ambulation.  Oxycodone 5 mg #20  out of 180 remaining. Filled 02-19-16.

## 2016-03-18 NOTE — Progress Notes (Signed)
Patient's Name: Ricky Mercer  Patient type: Established  MRN: [id number]  Service setting: Ambulatory outpatient  DOB: 1951-03-21  Location: [facility]  DOS: 03/18/2016  Primary Care Physician: Sofie Hartigan, MD  Note by: Kathlen Brunswick. Dossie Arbour, M.D, DABA, DABAPM, DABPM, DABIPP, FIPP  Referring Physician: Sofie Hartigan, MD  Specialty: Board-Certified Interventional Pain Management  Last Visit to Pain Management: 03/09/2016   Primary Reason(s) for Visit: Encounter for prescription drug management (Level of risk: moderate) CC: Back Pain and Hip Pain   HPI  Ricky Mercer is a 65 y.o. year old, male patient, who returns today as an established patient. He has Hand weakness; Polyradiculopathy; Chronic pain; Long term current use of opiate analgesic; Long term prescription opiate use; Opiate use (45 MME/Day); Opiate dependence ([place]); Encounter for therapeutic drug level monitoring; Chronic low back pain  (Location of Primary Pain) (Bilateral) (L>R); Chronic lower extremity pain (Location of Tertiary source of pain) (Bilateral) (L>R); Chronic lumbar radicular pain (Bilateral) (L>R) (Bilateral S1 dermatomal distribution); Chronic hip pain (Location of Secondary source of pain) (Bilateral) (L>R); Chronic upper back pain (Bilateral) (L>R); Chronic neck pain (neck<shoulder) (L>R); Chronic foot pain; Chronic knee pain (Right); Cervical spondylosis; 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); Chronic upper extremity pain (Bilateral) (L>R); Lumbar spondylosis; Cervical facet hypertrophy; Cervical facet syndrome (R>L); History of prostate cancer; Radicular pain of shoulder; Class I Morbid obesity (HCC) (68% higher incidence of chronic low back problems); Peripheral neuropathy (HCC) (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); Lumbar facet syndrome (Location of Primary Source of Pain) (Bilateral) (L>R); Sleep apnea; Depression; High cholesterol; Low testosterone;  History of surgical procedure; Therapeutic opioid-induced constipation (OIC); Therapeutic opioid induced constipation; Chronic shoulder pain (Bilateral) (L>R); Breathing orally; Chest pain; Primary cardiomyopathy ([place]); Chronic sacroiliac joint pain (Location of Primary Source of Pain) (Bilateral) (L>R); Musculoskeletal pain; Pain of right foot; and Neurogenic pain on his problem list.. His primarily concern today is the Back Pain and Hip Pain   Pain Assessment: Self-Reported Pain Score: 4              Reported level is compatible with observation       Pain Type: Chronic pain Pain Location: Back Pain Orientation: Lower Pain Descriptors / Indicators: Dull Pain Frequency: Constant  The patient comes into the clinics today for pharmacological management of his chronic pain. I last saw this patient on 02/16/2016. The patient  reports that he does not use illicit drugs. His body mass index is 27.31 kg/(m^2).  Date of Last Visit: 02/16/16 Service Provided on Last Visit: Evaluation  Controlled Substance Pharmacotherapy Assessment & REMS (Risk Evaluation and Mitigation Strategy)  Analgesic: Oxycodone IR 10 mg every 8 hours (30 mg/day) MME/day: 45 mg/day. Pill Count: Oxycodone 5 mg #20 out of 180 remaining. Filled 02-19-16. Pharmacokinetics: Onset of action (Liberation/Absorption): Within expected pharmacological parameters Time to Peak effect (Distribution): Timing and results are as within normal expected parameters Duration of action (Metabolism/Excretion): Within normal limits for medication Pharmacodynamics: Analgesic Effect: More than 50% Activity Facilitation: Medication(s) allow patient to sit, stand, walk, and do the basic ADLs Perceived Effectiveness: Described as relatively effective, allowing for increase in activities of daily living (ADL) Side-effects or Adverse reactions: None reported Monitoring: [place] PMP: Online review of the past 7-monthperiod conducted. Compliant with practice  rules and regulations Last UDS on record: TOXASSURE SELECT 13  Date Value Ref Range Status  12/23/2015 FINAL  Final    Comment:    ==================================================================== TOXASSURE  SELECT 13 (MW) ==================================================================== Test                             Result       Flag       Units Drug Present and Declared for Prescription Verification   Oxycodone                      77           EXPECTED   ng/mg creat   Oxymorphone                    886          EXPECTED   ng/mg creat   Noroxycodone                   404          EXPECTED   ng/mg creat   Noroxymorphone                 329          EXPECTED   ng/mg creat    Sources of oxycodone are scheduled prescription medications.    Oxymorphone, noroxycodone, and noroxymorphone are expected    metabolites of oxycodone. Oxymorphone is also available as a    scheduled prescription medication. ==================================================================== Test                      Result    Flag   Units      Ref Range   Creatinine              160              mg/dL      >=20 ==================================================================== Declared Medications:  The flagging and interpretation on this report are based on the  following declared medications.  Unexpected results may arise from  inaccuracies in the declared medications.  **Note: The testing scope of this panel includes these medications:  Oxycodone  **Note: The testing scope of this panel does not include following  reported medications:  Acetaminophen  Aspirin  Bisacodyl  Citalopram  Diphenhydramine  Docusate  Pantoprazole  Simvastatin  Supplement  Testosterone ==================================================================== For clinical consultation, please call [phone]. ====================================================================    UDS interpretation: Compliant           Medication Assessment Form: Reviewed. Patient indicates being compliant with therapy Treatment compliance: Compliant Risk Assessment: Aberrant Behavior: None observed today Substance Use Disorder (SUD) Risk Level: No change since last visit Risk of opioid abuse or dependence: 0.7-3.0% with doses ? 36 MME/day and 6.1-26% with doses ? 120 MME/day. Opioid Risk Tool (ORT) Score:      Depression Scale Score: PHQ-2: PHQ-2 Total Score: 0       PHQ-9: PHQ-9 Total Score: 0        Pharmacologic Plan: No change in therapy, at this time  Laboratory Chemistry  Inflammation Markers Lab Results  Component Value Date   ESRSEDRATE 4 08/21/2015   CRP <0.5 08/21/2015    Renal Function Lab Results  Component Value Date   BUN 13 11/25/2015   CREATININE 0.96 11/25/2015   GFRAA >60 11/25/2015   GFRNONAA >60 11/25/2015    Hepatic Function Lab Results  Component Value Date   AST 31 11/25/2015   ALT 41 11/25/2015   ALBUMIN 3.9 11/25/2015    Electrolytes Lab Results  Component  Value Date   NA 138 11/25/2015   K 4.2 11/25/2015   CL 104 11/25/2015   CALCIUM 9.0 11/25/2015   MG 2.1 08/21/2015    Pain Modulating Vitamins No results found for: Maralyn Sago [id number], [id number], [id number], 25OHVITD1, 25OHVITD2, 25OHVITD3, VITAMINB12  Coagulation Parameters Lab Results  Component Value Date   PLT 238 11/25/2015    Note: Labs Reviewed.  Recent Diagnostic Imaging  Cervical Imaging: Cervical MR wo contrast: No results found for this or any previous visit. Cervical MR wo contrast:  Results for orders placed in visit on 10/11/14  MR C Spine Ltd W/O Cm   Narrative * PRIOR REPORT IMPORTED FROM AN EXTERNAL SYSTEM *   CLINICAL DATA:  Severe neck pain with pain in both shoulders and  between the shoulder blades.   EXAM:  MRI CERVICAL SPINE WITHOUT CONTRAST   TECHNIQUE:  Multiplanar, multisequence MR imaging of the cervical spine was  performed. No intravenous contrast was  administered.   COMPARISON:  11/23/2012   FINDINGS:  Trace anterolisthesis of C3 on C4 is unchanged. Vertebral body  heights and intervertebral disc space heights are preserved. There  is no evidence of vertebral marrow edema. Craniocervical junction is  unremarkable. Cervical spinal cord is normal in caliber and signal.  Paraspinal soft tissues are unremarkable.   C2-3: Severe left facet arthrosis results in mild left neural  foraminal stenosis, unchanged. No spinal stenosis.   C3-4: Broad-based posterior disc osteophyte complex and moderate to  severe right facet arthrosis result in mild right neural foraminal  stenosis, unchanged. No spinal stenosis.   C4-5: Mild bilateral facet arthrosis without disc herniation or  stenosis.   C5-6:  No disc herniation or stenosis.   C6-7:  No disc herniation or stenosis.   C7-T1:  No disc herniation or stenosis.   IMPRESSION:  Moderate to severe upper cervical facet arthrosis and mild disc  degeneration resulting in mild left neural foraminal stenosis at  C2-3 and mild right neural foraminal stenosis at C3-4, unchanged. No  spinal stenosis.    Electronically Signed    By: Logan Bores    On: 10/11/2014 14:22       Thoracic Imaging: Thoracic MR wo contrast:  Results for orders placed in visit on 10/20/15  MR Thoracic Spine Wo Contrast   Thoracic DG 2-3 views:  Results for orders placed during the hospital encounter of 11/03/15  DG Thoracic Spine 2 View   Narrative CLINICAL DATA:  Upper back pain for 3-4 years, worsening the last 3 months. Does heavy lifting at work.  EXAM: THORACIC SPINE 2 VIEWS  COMPARISON:  CT chest 11/15/2013  FINDINGS: Mild degenerate spurring in the mid and lower thoracic spine. No fracture. Normal alignment. Visualized lungs are clear.  IMPRESSION: Mild spondylosis.  No acute findings.   Electronically Signed   By: Rolm Baptise M.D.   On: 11/03/2015 11:29    Lumbosacral Imaging: Lumbar  MR wo contrast:  Results for orders placed in visit on 11/13/15  MR L Spine Ltd W/O Cm   Narrative * PRIOR REPORT IMPORTED FROM AN EXTERNAL SYSTEM *   CLINICAL DATA:  Fall from a height. Low back pain extending to both  legs.   EXAM:  MRI LUMBAR SPINE WITHOUT CONTRAST   TECHNIQUE:  Multiplanar, multisequence MR imaging of the lumbar spine was  performed. No intravenous contrast was administered.   COMPARISON:  03/02/2012   FINDINGS:  There is no evidence of regional fracture. There are minimal,  non-compressive  disc bulges at L3-4, L4-5 and L5-S1. No evidence of  facet arthropathy or slippage. Paravertebral soft tissues appear  normal. Distal cord and conus appear normal. Paravertebral soft  tissues appear unremarkable.   IMPRESSION:  No change since the study of 2013. Essentially normal study for a  person of this age. Minimal, non-compressive disc bulges at L3-4,  L4-5 and L5-S1. No post traumatic finding.    Electronically Signed    By: Nelson Chimes M.D.    On: 03/30/2014 09:35       Lumbar MR w/wo contrast:  Results for orders placed in visit on 03/02/12  MR Lumbar Spine W Wo Contrast   Narrative * PRIOR REPORT IMPORTED FROM AN EXTERNAL SYSTEM *   PRIOR REPORT IMPORTED FROM THE SYNGO WORKFLOW SYSTEM   REASON FOR EXAM:    hx prostate cancer Low back pain left leg pain Left  groin pain  COMMENTS:   PROCEDURE:     MMR - MMR LUMBAR SPINE WO/W  - Mar 02 2012  3:40PM   RESULT:     MRI LUMBAR SPINE WITHOUT AND WITH CONTRAST   HISTORY: Low back pain   COMPARISON: 11/04/2010   TECHNIQUE: Standard MRI sequences of the lumbar spine were obtained both  pre- and post-administration of 18 ml of intravenous Magnevist.   FINDINGS:   The vertebral bodies of the lumbar spine are normal in size and alignment.  There is normal bone marrow signal demonstrated throughout the vertebra.  The  intervertebral disc spaces are well-maintained.   There are no areas of abnormal  enhancement.   The spinal cord is of normal volume and contour. The cord terminates  normally at L1 . The nerve roots of the cauda equina and the filum  terminale  have the usual appearance.   The visualized portions of the SI joints are unremarkable.   The imaged intra-abdominal contents are unremarkable.   T12-L1: No significant disc bulge. No evidence of neural foraminal or  central stenosis.   L1-L2: No significant disc bulge. No evidence of neural foraminal or  central  stenosis.   L2-L3: Minimal broad-based disc bulge. No evidence of neural foraminal or  central stenosis.   L3-L4: No significant disc bulge. No evidence of neural foraminal or  central  stenosis.   L4-L5: Mild broad-based disc bulge. No evidence of neural foraminal or  central stenosis.   L5-S1: Minimal broad-based disc bulge. No evidence of neural foraminal or  central stenosis.   IMPRESSION:   1. Mild lumbar spine spondylosis as described above.   Dictation Site: 1       Hip Imaging: Hip-L MR wo contrast:  Results for orders placed in visit on 03/02/12  MR Hip Left Wo Contrast   Narrative * PRIOR REPORT IMPORTED FROM AN EXTERNAL SYSTEM *   PRIOR REPORT IMPORTED FROM THE SYNGO WORKFLOW SYSTEM   REASON FOR EXAM:    hx prostate cancer Low back pain left leg pain Left  groin pain  COMMENTS:   PROCEDURE:     MMR - MMR HIP LT WO CONTRAST  - Mar 02 2012  3:40PM   RESULT:     MRI of the Bilateral Hips   History:  Low back pain   Comparison:  None   Technique:  Multiplanar and multisequence MR imaging of the hips was  performed without contrast.   Findings:   No fracture or evidence of avascular necrosis is identified.  Alignment is  anatomic.  No edema or  effusion is seen.   The sacrum and sacroiliac  joints  are within normal limits.   There is relative bladder wall thickening which may represent post  radiation  changes versus underdistention.   IMPRESSION:   1.  No AVN or  fracture seen.   Dictation Site: 1       Knee Imaging: Knee-R MR wo contrast:  Results for orders placed in visit on 02/18/14  MR Knee Right Wo Contrast   Narrative * PRIOR REPORT IMPORTED FROM AN EXTERNAL SYSTEM *   CLINICAL DATA:  Right knee pain for 3 weeks.   EXAM:  MRI OF THE RIGHT KNEE WITHOUT CONTRAST   TECHNIQUE:  Multiplanar, multisequence MR imaging of the knee was performed. No  intravenous contrast was administered.   COMPARISON:  None.   FINDINGS:  MENISCI   Medial meniscus:  Intact.   Lateral meniscus:  Intact.   LIGAMENTS   Cruciates:  Intact.   Collaterals:  Intact.   CARTILAGE   Patellofemoral:  Unremarkable.   Medial:  Minimally degenerated.   Lateral:  Minimally degenerated.   Joint:  Small joint effusion.   Popliteal Fossa:  No Baker's cyst.   Extensor Mechanism:  Intact.   Bones: There is marked marrow edema in the lateral femoral condyle  with a T1 and T2 hypointense line in subchondral bone. Small  osteophytes are present about the lateral compartment.   IMPRESSION:  Study is positive for acute or subacute subchondral insufficiency  fracture of the lateral femoral condyle with associated intense  marrow edema.   Negative for meniscal or ligament tear.    Electronically Signed    By: Inge Rise M.D.    On: 02/19/2014 08:44       Note: Imaging reviewed.  Meds  The patient has a current medication list which includes the following prescription(s): androgel pump, aspirin, bisacodyl, citalopram, diphenhydramine-acetaminophen, docusate sodium, oxycodone, oxycodone, oxycodone, pantoprazole, simvastatin, benefiber, cyclobenzaprine, and gabapentin.  Current Outpatient Prescriptions on File Prior to Visit  Medication Sig  . ANDROGEL PUMP 20.25 MG/ACT (1.62%) GEL APPLY TO 2 PUMPS ONCE A DAY AS DIRECTED  . aspirin 81 MG tablet Take 81 mg by mouth daily.  . citalopram (CELEXA) 10 MG tablet Take 10 mg by mouth daily.  .  diphenhydramine-acetaminophen (TYLENOL PM EXTRA STRENGTH) 25-500 MG TABS tablet Take 1 tablet by mouth at bedtime as needed.  . pantoprazole (PROTONIX) 40 MG tablet Take 40 mg by mouth daily.  . simvastatin (ZOCOR) 10 MG tablet Take 10 mg by mouth at bedtime.    No current facility-administered medications on file prior to visit.    ROS  Constitutional: Denies any fever or chills Gastrointestinal: No reported hemesis, hematochezia, vomiting, or acute GI distress Musculoskeletal: Denies any acute onset joint swelling, redness, loss of ROM, or weakness Neurological: No reported episodes of acute onset apraxia, aphasia, dysarthria, agnosia, amnesia, paralysis, loss of coordination, or loss of consciousness  Allergies  Mr. Hyson has No Known Allergies.  [place]  Medical:  Mr. Steelman  has a past medical history of Hyperlipidemia; GERD (gastroesophageal reflux disease); Prostate cancer ([place]); Seasonal allergies; Situational anxiety; Diverticulosis; Dupuytren's contracture; Knee pain; Midline low back pain; Pain in neck; Foot pain, bilateral; Bilateral arm pain; Hand weakness; Osteoarthritis; Sleep apnea; Cardiomyopathy ([place]); and Reactive airway disease. Family: family history includes Colon cancer in his brother; Hyperlipidemia in his sister; Lung cancer in his father; Prostate cancer in his brother. Surgical:  has past surgical history that includes Nose surgery;  Hand surgery (Bilateral); Colonoscopy (10/24/2012); Upper gi endoscopy (12/18/2012); Tonsillectomy (2009); Skin cancer excision (Left, 2015); Mass excision (Right, 07/18/2015); Esophagogastroduodenoscopy (egd) with propofol (N/A, 10/28/2015); prostate seeding; Hernia repair; and Cholecystectomy (N/A, 12/02/2015). Tobacco:  reports that he has never smoked. He has never used smokeless tobacco. Alcohol:  reports that he does not drink alcohol. Drug:  reports that he does not use illicit drugs.  Constitutional Exam  Vitals: Blood pressure  117/87, pulse 72, temperature 98.3 F (36.8 C), temperature source Oral, resp. rate 18, height _0  (1.753 m), weight 185 lb (83.915 kg), SpO2 99 %. General appearance: Well nourished, well developed, and well hydrated. In no acute distress Calculated BMI/Body habitus: Body mass index is 27.31 kg/(m^2).       Psych/Mental status: Alert and oriented x 3 (person, place, & time) Eyes: PERLA Respiratory: No evidence of acute respiratory distress  Cervical Spine Exam  Inspection: No masses, redness, or swelling Alignment: Symmetrical ROM: Functional: ROM is within functional limits [facility]) Stability: No instability detected Muscle strength & Tone: Functionally intact Sensory: Unimpaired Palpation: No complaints of tenderness  Upper Extremity (UE) Exam    Side: Right upper extremity  Side: Left upper extremity  Inspection: No masses, redness, swelling, or asymmetry  Inspection: No masses, redness, swelling, or asymmetry  ROM:  ROM:  Functional: ROM is within functional limits [facility])        Functional: ROM is within functional limits [facility])        Muscle strength & Tone: Functionally intact  Muscle strength & Tone: Functionally intact  Sensory: Unimpaired  Sensory: Unimpaired  Palpation: No complaints of tenderness  Palpation: No complaints of tenderness   Thoracic Spine Exam  Inspection: No masses, redness, or swelling Alignment: Symmetrical ROM: Functional: ROM is within functional limits [facility]) Stability: No instability detected Sensory: Unimpaired Muscle strength & Tone: Functionally intact Palpation: No complaints of tenderness  Lumbar Spine Exam  Inspection: No masses, redness, or swelling Alignment: Symmetrical ROM: Functional: ROM is within functional limits [facility]) Stability: No instability detected Muscle strength & Tone: Functionally intact Sensory: Unimpaired Palpation: No complaints of tenderness Provocative Tests: Lumbar Hyperextension and rotation test: Positive  bilaterally for facet joint pain. Patrick's Maneuver: Positive for bilateral S-I joint pain and for bilateral hip joint pain.  Gait & Posture Assessment  Ambulation: Unassisted Gait: Unaffected Posture: WNL   Lower Extremity Exam    Side: Right lower extremity  Side: Left lower extremity  Inspection: No masses, redness, swelling, or asymmetry ROM:  Inspection: No masses, redness, swelling, or asymmetry ROM:  Functional: ROM is within functional limits [facility])        Functional: ROM is within functional limits [facility]        Muscle strength & Tone: Functionally intact  Muscle strength & Tone: Functionally intact  Sensory: Unimpaired  Sensory: Unimpaired  Palpation: No complaints of tenderness  Palpation: No complaints of tenderness   Assessment & Plan  Primary Diagnosis & Pertinent Problem List: The primary encounter diagnosis was Chronic pain. 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  Visit Diagnosis: 1. Chronic pain   2. Encounter for therapeutic drug level monitoring   3. Long term current use of opiate analgesic   4. Musculoskeletal  pain   5. Neurogenic pain   6. Opiate use   7. Therapeutic opioid induced constipation   8. Chronic sacroiliac joint pain (Location of Primary Source of Pain) (Bilateral) (L>R)   9. Lumbar facet syndrome (Location of Primary Source of Pain) (Bilateral) (L>R)   10. Chronic hip pain, unspecified laterality   11. Cervical facet syndrome (R>L)   12. Chronic shoulder pain (Bilateral) (L>R)   13. 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 Chronic knee pain (Right)   15. Chronic low back pain  (Location of Primary Pain) (Bilateral) (L>R)   16. Chronic pain of lower extremity, unspecified laterality   17. Chronic lumbar radicular pain (Bilateral) (L>R) (Bilateral S1 dermatomal distribution)   18. Chronic neck pain (neck<shoulder) (L>R)   19. Chronic pain of both upper extremities   20. Radicular pain of shoulder     Problems updated and reviewed during this visit: No problems updated.  Problem-specific Plan(s): No problem-specific assessment & plan notes found for this encounter.  No new assessment & plan notes have been filed under this hospital service since the last note was generated. 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) (Chronic)   Relevant Orders   CERVICAL EPIDURAL STEROID INJECTION   Chronic hip pain (Location of Secondary source of pain) (Bilateral) (L>R) (Chronic)   Relevant Medications   gabapentin (NEURONTIN) 100 MG capsule   cyclobenzaprine (FLEXERIL) 10 MG tablet   oxyCODONE (OXY IR/ROXICODONE) 5 MG immediate release tablet   oxyCODONE (OXY IR/ROXICODONE) 5 MG immediate release tablet   oxyCODONE (OXY IR/ROXICODONE) 5 MG immediate release tablet   Other Relevant Orders   DG HIP UNILAT W OR W/O PELVIS 2-3 VIEWS LEFT   DG HIP UNILAT W  OR W/O PELVIS 2-3 VIEWS RIGHT   HIP INJECTION   Chronic knee pain (Right) (Chronic)   Relevant Orders   KNEE INJECTION   GENICULAR NERVE BLOCK   Chronic low back pain  (Location of Primary Pain) (Bilateral) (L>R) (Chronic)   Relevant Medications   cyclobenzaprine (FLEXERIL) 10 MG tablet   oxyCODONE (OXY IR/ROXICODONE) 5 MG immediate release tablet   oxyCODONE (OXY IR/ROXICODONE) 5 MG immediate release tablet   oxyCODONE (OXY IR/ROXICODONE) 5 MG immediate release tablet   Other Relevant Orders   LUMBAR EPIDURAL STEROID INJECTION   LUMBAR FACET(MEDIAL BRANCH NERVE BLOCK) MBNB   LUMBAR EPIDURAL STEROID INJECTION   Chronic lower extremity pain (Location of Tertiary source of pain) (Bilateral) (L>R) (Chronic)   Relevant Orders   LUMBAR EPIDURAL STEROID INJECTION   LUMBAR EPIDURAL STEROID INJECTION   Chronic lumbar radicular pain (Bilateral) (L>R) (Bilateral S1 dermatomal distribution) (Chronic)   Relevant Orders   LUMBAR EPIDURAL STEROID INJECTION   LUMBAR EPIDURAL STEROID INJECTION   Chronic neck pain (neck<shoulder) (L>R) (Chronic)   Relevant Medications   gabapentin (NEURONTIN) 100 MG  capsule   cyclobenzaprine (FLEXERIL) 10 MG tablet   oxyCODONE (OXY IR/ROXICODONE) 5 MG immediate release tablet   oxyCODONE (OXY IR/ROXICODONE) 5 MG immediate release tablet   oxyCODONE (OXY IR/ROXICODONE) 5 MG immediate release tablet   Other Relevant Orders   CERVICAL EPIDURAL STEROID INJECTION   CERVICAL FACET (MEDIAL BRANCH NERVE BLOCK)    Chronic pain - Primary (Chronic)   Relevant Medications   gabapentin (NEURONTIN) 100 MG capsule   cyclobenzaprine (FLEXERIL) 10 MG tablet   oxyCODONE (OXY IR/ROXICODONE) 5 MG immediate release tablet   oxyCODONE (OXY IR/ROXICODONE) 5 MG immediate release tablet   oxyCODONE (OXY IR/ROXICODONE) 5 MG immediate release tablet   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)   Relevant Medications    cyclobenzaprine (FLEXERIL) 10 MG tablet   oxyCODONE (OXY IR/ROXICODONE) 5 MG immediate release tablet   oxyCODONE (OXY IR/ROXICODONE) 5 MG immediate release tablet   oxyCODONE (OXY IR/ROXICODONE) 5 MG immediate release tablet   Other Relevant Orders   SACROILIAC JOINT INJECTINS   DG Si Joints   Chronic shoulder pain (Bilateral) (L>R) (Chronic)   Relevant Orders   SHOULDER INJECTION   SUPRASCAPULAR NERVE BLOCK   Chronic upper extremity pain (Bilateral) (L>R) (Chronic)   Relevant Medications   gabapentin (NEURONTIN) 100 MG capsule   cyclobenzaprine (FLEXERIL) 10 MG tablet   oxyCODONE (OXY IR/ROXICODONE) 5 MG immediate release tablet   oxyCODONE (OXY IR/ROXICODONE) 5 MG immediate release tablet   oxyCODONE (OXY IR/ROXICODONE) 5 MG immediate release tablet   Other Relevant Orders   CERVICAL EPIDURAL STEROID INJECTION   Lumbar facet syndrome (Location of Primary Source of Pain) (Bilateral) (L>R) (Chronic)   Relevant Medications   cyclobenzaprine (FLEXERIL) 10 MG tablet   oxyCODONE (OXY IR/ROXICODONE) 5 MG immediate release tablet   oxyCODONE (OXY IR/ROXICODONE) 5 MG immediate release tablet   oxyCODONE (OXY IR/ROXICODONE) 5 MG immediate release tablet   Other Relevant Orders   LUMBAR FACET(MEDIAL BRANCH NERVE BLOCK) MBNB   LUMBAR FACET(MEDIAL BRANCH NERVE BLOCK) MBNB   Musculoskeletal pain (Chronic)   Relevant Medications   cyclobenzaprine (FLEXERIL) 10 MG tablet   Neurogenic pain (Chronic)   Relevant Medications   gabapentin (NEURONTIN) 100 MG capsule   Radicular pain of shoulder (Chronic)   Relevant Medications   gabapentin (NEURONTIN) 100 MG capsule   cyclobenzaprine (FLEXERIL) 10 MG tablet   Other Relevant Orders   CERVICAL EPIDURAL STEROID INJECTION     Medium   Encounter for therapeutic drug level monitoring   Long term current use of opiate analgesic (Chronic)   Opiate use (45 MME/Day) (Chronic)   Relevant Medications   bisacodyl (DULCOLAX) 5 MG EC tablet   docusate  sodium (COLACE) 100 MG capsule   Wheat Dextrin (BENEFIBER) POWD   Therapeutic opioid induced constipation   Relevant Medications   bisacodyl (DULCOLAX) 5 MG EC tablet   docusate sodium (COLACE) 100 MG capsule   Wheat Dextrin (BENEFIBER) POWD       Pharmacotherapy (Medications Ordered): Meds ordered this encounter  Medications  . gabapentin (NEURONTIN) 100 MG capsule    Sig: Take 1-3 capsules (100-300 mg total) by mouth at bedtime. Follow the written titration schedule.    Dispense:  90 capsule    Refill:  2    Do not place this medication, or any other prescription from our practice, on "Automatic Refill". Patient may have prescription filled one day early if pharmacy is closed on scheduled refill date.  . cyclobenzaprine (FLEXERIL) 10  MG tablet    Sig: Take 1 tablet (10 mg total) by mouth 3 (three) times daily as needed for muscle spasms.    Dispense:  90 tablet    Refill:  2    Do not place this medication, or any other prescription from our practice, on "Automatic Refill". Patient may have prescription filled one day early if pharmacy is closed on scheduled refill date.  . bisacodyl (DULCOLAX) 5 MG EC tablet    Sig: Take 2 tablets (10 mg total) by mouth daily as needed for moderate constipation.    Dispense:  30 tablet    Refill:  2    Do not place this medication, or any other prescription from our practice, on "Automatic Refill". Patient may have prescription filled one day early if pharmacy is closed on scheduled refill date.  . docusate sodium (COLACE) 100 MG capsule    Sig: Take 1 capsule (100 mg total) by mouth at bedtime.    Dispense:  30 capsule    Refill:  2    Do not place this medication, or any other prescription from our practice, on "Automatic Refill". Patient may have prescription filled one day early if pharmacy is closed on scheduled refill date.  . Wheat Dextrin (BENEFIBER) POWD    Sig: Stir 2 tsp. TID into 4-8 oz of any non-carbonated beverage or soft food (hot  or cold)    Dispense:  500 g    Refill:  2    Do not place this medication, or any other prescription from our practice, on "Automatic Refill". Patient may have prescription filled one day early if pharmacy is closed on scheduled refill date.  Marland Kitchen oxyCODONE (OXY IR/ROXICODONE) 5 MG immediate release tablet    Sig: Take 1 tablet (5 mg total) by mouth every 4 (four) hours as needed for severe pain. Maximum: 6/day    Dispense:  180 tablet    Refill:  0    Do not add this medication to the electronic "Automatic Refill" notification system. Patient may have prescription filled one day early if pharmacy is closed on scheduled refill date. Do not fill until: 03/18/16 To last until: 04/17/16  . oxyCODONE (OXY IR/ROXICODONE) 5 MG immediate release tablet    Sig: Take 1 tablet (5 mg total) by mouth every 4 (four) hours as needed for severe pain. Maximum: 6/day    Dispense:  180 tablet    Refill:  0    Do not add this medication to the electronic "Automatic Refill" notification system. Patient may have prescription filled one day early if pharmacy is closed on scheduled refill date. Do not fill until: 04/17/16 To last until: 05/17/16  . oxyCODONE (OXY IR/ROXICODONE) 5 MG immediate release tablet    Sig: Take 1 tablet (5 mg total) by mouth every 4 (four) hours as needed for severe pain. Maximum: 6/day    Dispense:  180 tablet    Refill:  0    Do not add this medication to the electronic "Automatic Refill" notification system. Patient may have prescription filled one day early if pharmacy is closed on scheduled refill date. Do not fill until: 05/17/16 To last until: 06/16/16    [facility] & Procedure Ordered: Orders Placed This Encounter  Procedures  . LUMBAR FACET(MEDIAL BRANCH NERVE BLOCK) MBNB  . SACROILIAC JOINT INJECTINS  . CERVICAL EPIDURAL STEROID INJECTION  . CERVICAL FACET (MEDIAL BRANCH NERVE BLOCK)   . LUMBAR EPIDURAL STEROID INJECTION  . LUMBAR FACET(MEDIAL BRANCH NERVE BLOCK) MBNB  .  LUMBAR EPIDURAL STEROID INJECTION  . SHOULDER INJECTION  . SUPRASCAPULAR NERVE BLOCK  . HIP INJECTION  . KNEE INJECTION  . GENICULAR NERVE BLOCK  . DG Si Joints  . DG HIP UNILAT W OR W/O PELVIS 2-3 VIEWS LEFT  . DG HIP UNILAT W OR W/O PELVIS 2-3 VIEWS RIGHT    Imaging Ordered: DG SI JOINTS DG HIP UNILAT W OR W/O PELVIS 2-3 VIEWS LEFT DG HIP UNILAT W OR W/O PELVIS 2-3 VIEWS RIGHT  Interventional Therapies: Scheduled:   1. Bilateral Lumbar Facet & Bilateral S-I joint block under fluoroscopy and IV sedation.   Considering:   1. Diagnostic Bilateral Lumbar Facet & Bilateral S-I joint block under fluoroscopy and IV sedation. 2. Possible Bilateral Lumbar Facet & Bilateral S-I joint RFA. 3. Diagnostic Bilateral Hip Block 4. Possible Bilateral Hip RFA. 5. Diagnostic bilateral cervical facet block. 6. Possible bilateral cervical facet RFA. 7. Diagnostic Caudal ESI. 8. Diagnostic Left CESI. 9. Diagnostic Right Knee injection. 10. Diagnostic right Genicular NB 11. Possible right Genicular Nerve RFA. 12. Diagnostic bilateral intra-articular shoulder inj. 13. Diagnostic bilateral suprascapular NB. 14. Possible bilateral suprascapular Nerve RFA.   PRN Procedures:   1. Diagnostic Bilateral Lumbar Facet & Bilateral S-I joint block under fluoroscopy and IV sedation. 2. Diagnostic Bilateral Hip Block 3. Diagnostic bilateral cervical facet block. 4. Diagnostic Caudal ESI. 5. Diagnostic Left CESI. 6. Diagnostic Right Knee injection. 7. Diagnostic right Genicular NB 8. Diagnostic bilateral intra-articular shoulder inj. 9. Diagnostic bilateral suprascapular NB.   Referral(s) or Consult(s): None at this time.  New Prescriptions   CYCLOBENZAPRINE (FLEXERIL) 10 MG TABLET    Take 1 tablet (10 mg total) by mouth 3 (three) times daily as needed for muscle spasms.   GABAPENTIN (NEURONTIN) 100 MG CAPSULE    Take 1-3 capsules (100-300 mg total) by mouth at bedtime. Follow the written titration  schedule.    Medications administered during this visit: Mr. Kolar had no medications administered during this visit.  Requested PM Follow-up: Return in about 3 months (around 06/18/2016) for (3-Mo), Med-Mgmt, Schedule Procedure.  Future Appointments Date Time Provider [place]  04/13/2016 12:45 PM Milinda Pointer, MD [facility] None  06/07/2016 1:20 PM Milinda Pointer, MD [facility] None    Primary Care Physician: [facility], Chrissie Noa, MD Location: [facility] Note by: Kathlen Brunswick Dossie Arbour, M.D, DABA, DABAPM, DABPM, DABIPP, FIPP  Pain Score Disclaimer: We use the NRS-11 scale. This is a self-reported, subjective measurement of pain severity with only modest accuracy. It is used primarily to identify changes within a particular patient. It must be understood that outpatient pain scales are significantly less accurate that those used for research, where they can be applied under ideal controlled circumstances with minimal exposure to variables. In reality, the score is likely to be a combination of pain intensity and pain affect, where pain affect describes the degree of emotional arousal or changes in action readiness caused by the sensory experience of pain. Factors such as social and work situation, setting, emotional state, anxiety levels, expectation, and prior pain experience may influence pain perception and show large inter-individual differences that may also be affected by time variables.  Patient instructions provided during this appointment: Patient Instructions   You were given instructions on how to titrate Gabapentin. You were given 3 prescriptions for oxycodone. Scripts for Dulcolax, Colace, Flexeril, Neurontin, and Benefiber were e-scribed to your pharmacy.Radiofrequency Lesioning Radiofrequency lesioning is a procedure that is performed to relieve pain. The procedure is often used  for back, neck, or arm pain. Radiofrequency lesioning  involves the use of a machine that creates radio waves to make heat. During the procedure, the heat is applied to the nerve that carries the pain signal. The heat damages the nerve and interferes with the pain signal. Pain relief usually lasts for 6 months to 1 year. LET [facility] CARE PROVIDER KNOW ABOUT:  Any allergies you have.  All medicines you are taking, including vitamins, herbs, eye drops, creams, and over-the-counter medicines.  Previous problems you or members of your family have had with the use of anesthetics.  Any blood disorders you have.  Previous surgeries you have had.  Any medical conditions you have.  Whether you are pregnant or may be pregnant. RISKS AND COMPLICATIONS Generally, this is a safe procedure. However, problems may occur, including:  Pain or soreness at the injection site.  Infection at the injection site.  Damage to nerves or blood vessels. BEFORE THE PROCEDURE  Ask your health care provider about:  Changing or stopping your regular medicines. This is especially important if you are taking diabetes medicines or blood thinners.  Taking medicines such as aspirin and ibuprofen. These medicines can thin your blood. Do not take these medicines before your procedure if your health care provider instructs you not to.  Follow instructions from your health care provider about eating or drinking restrictions.  Plan to have someone take you home after the procedure.  If you go home right after the procedure, plan to have someone with you for 24 hours. PROCEDURE  You will be given one or more of the following:  A medicine to help you relax (sedative).  A medicine to numb the area (local anesthetic).  You will be awake during the procedure. You will need to be able to talk with the health care provider during the procedure.  With the help of a type of X-ray (fluoroscopy), the health care provider will insert a radiofrequency needle into the area to  be treated.  Next, a wire that carries the radio waves (electrode) will be put through the radiofrequency needle. An electrical pulse will be sent through the electrode to verify the correct nerve. You will feel a tingling sensation, and you may have muscle twitching.  Then, the tissue that is around the needle tip will be heated by an electric current that is passed using the radiofrequency machine. This will numb the nerves.  A bandage (dressing) will be put on the insertion area after the procedure is done. The procedure may vary among health care providers and hospitals. AFTER THE PROCEDURE  Your blood pressure, heart rate, breathing rate, and blood oxygen level will be monitored often until the medicines you were given have worn off.  Return to your normal activities as directed by your health care provider.   This information is not intended to replace advice given to you by your health care provider. Make sure you discuss any questions you have with your health care provider.   Document Released: 04/28/2011 Document Revised: 05/21/2015 Document Reviewed: 10/07/2014 Elsevier Interactive Patient Education 2016 [employer] GENERAL RISKS AND COMPLICATIONS  What are the risk, side effects and possible complications? Generally speaking, most procedures are safe.  However, with any procedure there are risks, side effects, and the possibility of complications.  The risks and complications are dependent upon the sites that are lesioned, or the type of nerve block to be performed.  The closer the procedure is to the spine, the  more serious the risks are.  Great care is taken when placing the radio frequency needles, block needles or lesioning probes, but sometimes complications can occur.  Infection: Any time there is an injection through the skin, there is a risk of infection.  This is why sterile conditions are used for these blocks.  There are four possible types of infection.  Localized  skin infection.  Central Nervous System Infection-This can be in the form of Meningitis, which can be deadly.  Epidural Infections-This can be in the form of an epidural abscess, which can cause pressure inside of the spine, causing compression of the spinal cord with subsequent paralysis. This would require an emergency surgery to decompress, and there are no guarantees that the patient would recover from the paralysis.  Discitis-This is an infection of the intervertebral discs.  It occurs in about 1% of discography procedures.  It is difficult to treat and it may lead to surgery.        2. Pain: the needles have to go through skin and soft tissues, will cause soreness.       3. Damage to internal structures:  The nerves to be lesioned may be near blood vessels or    other nerves which can be potentially damaged.       4. Bleeding: Bleeding is more common if the patient is taking blood thinners such as  aspirin, Coumadin, Ticiid, Plavix, etc., or if he/she have some genetic predisposition  such as hemophilia. Bleeding into the spinal canal can cause compression of the spinal  cord with subsequent paralysis.  This would require an emergency surgery to  decompress and there are no guarantees that the patient would recover from the  paralysis.       5. Pneumothorax:  Puncturing of a lung is a possibility, every time a needle is introduced in  the area of the chest or upper back.  Pneumothorax refers to free air around the  collapsed lung(s), inside of the thoracic cavity (chest cavity).  Another two possible  complications related to a similar event would include: Hemothorax and Chylothorax.   These are variations of the Pneumothorax, where instead of air around the collapsed  lung(s), you may have blood or chyle, respectively.       6. Spinal headaches: They may occur with any procedures in the area of the spine.       7. Persistent CSF (Cerebro-Spinal Fluid) leakage: This is a rare problem, but may  occur  with prolonged intrathecal or epidural catheters either due to the formation of a fistulous  track or a dural tear.       8. Nerve damage: By working so close to the spinal cord, there is always a possibility of  nerve damage, which could be as serious as a permanent spinal cord injury with  paralysis.       9. Death:  Although rare, severe deadly allergic reactions known as "Anaphylactic  reaction" can occur to any of the medications used.      10. Worsening of the symptoms:  We can always make thing worse.  What are the chances of something like this happening? Chances of any of this occuring are extremely low.  By statistics, you have more of a chance of getting killed in a motor vehicle accident: while driving to the hospital than any of the above occurring .  Nevertheless, you should be aware that they are possibilities.  In general, it is similar to taking  a shower.  Everybody knows that you can slip, hit your head and get killed.  Does that mean that you should not shower again?  Nevertheless always keep in mind that statistics do not mean anything if you happen to be on the wrong side of them.  Even if a procedure has a 1 (one) in a 1,000,000 (million) chance of going wrong, it you happen to be that one..Also, keep in mind that by statistics, you have more of a chance of having something go wrong when taking medications.  Who should not have this procedure? If you are on a blood thinning medication (e.g. Coumadin, Plavix, see list of "Blood Thinners"), or if you have an active infection going on, you should not have the procedure.  If you are taking any blood thinners, please inform your physician.  How should I prepare for this procedure?  Do not eat or drink anything at least six hours prior to the procedure.  Bring a driver with you .  It cannot be a taxi.  Come accompanied by an adult that can drive you back, and that is strong enough to help you if your legs get weak or numb from  the local anesthetic.  Take all of your medicines the morning of the procedure with just enough water to swallow them.  If you have diabetes, make sure that you are scheduled to have your procedure done first thing in the morning, whenever possible.  If you have diabetes, take only half of your insulin dose and notify our nurse that you have done so as soon as you arrive at the clinic.  If you are diabetic, but only take blood sugar pills (oral hypoglycemic), then do not take them on the morning of your procedure.  You may take them after you have had the procedure.  Do not take aspirin or any aspirin-containing medications, at least eleven (11) days prior to the procedure.  They may prolong bleeding.  Wear loose fitting clothing that may be easy to take off and that you would not mind if it got stained with Betadine or blood.  Do not wear any jewelry or perfume  Remove any nail coloring.  It will interfere with some of our monitoring equipment.  NOTE: Remember that this is not meant to be interpreted as a complete list of all possible complications.  Unforeseen problems may occur.  BLOOD THINNERS The following drugs contain aspirin or other products, which can cause increased bleeding during surgery and should not be taken for 2 weeks prior to and 1 week after surgery.  If you should need take something for relief of minor pain, you may take acetaminophen which is found in Tylenol,m Datril, Anacin-3 and Panadol. It is not blood thinner. The products listed below are.  Do not take any of the products listed below in addition to any listed on your instruction sheet.  A.P.C or A.P.C with Codeine Codeine Phosphate Capsules #3 Ibuprofen Ridaura  ABC compound Congesprin Imuran rimadil  Advil Cope Indocin Robaxisal  Alka-Seltzer Effervescent Pain Reliever and Antacid Coricidin or Coricidin-D  Indomethacin Rufen  Alka-Seltzer plus Cold Medicine Cosprin Ketoprofen S-A-C Tablets  Anacin Analgesic  Tablets or Capsules Coumadin Korlgesic Salflex  Anacin Extra Strength Analgesic tablets or capsules CP-2 Tablets Lanoril Salicylate  Anaprox Cuprimine Capsules Levenox Salocol  Anexsia-D Dalteparin Magan Salsalate  Anodynos Darvon compound Magnesium Salicylate Sine-off  Ansaid Dasin Capsules Magsal Sodium Salicylate  Anturane Depen Capsules Marnal Soma  APF Arthritis pain formula  Dewitt's Pills Measurin Stanback  Argesic Dia-Gesic Meclofenamic Sulfinpyrazone  Arthritis Bayer Timed Release Aspirin Diclofenac Meclomen Sulindac  Arthritis pain formula Anacin Dicumarol Medipren Supac  Analgesic (Safety coated) Arthralgen Diffunasal Mefanamic Suprofen  Arthritis Strength Bufferin Dihydrocodeine Mepro Compound Suprol  Arthropan liquid Dopirydamole Methcarbomol with Aspirin Synalgos  ASA tablets/Enseals Disalcid Micrainin Tagament  Ascriptin Doan's Midol Talwin  Ascriptin A/D Dolene Mobidin Tanderil  Ascriptin Extra Strength Dolobid Moblgesic Ticlid  Ascriptin with Codeine Doloprin or Doloprin with Codeine Momentum Tolectin  Asperbuf Duoprin Mono-gesic Trendar  Aspergum Duradyne Motrin or Motrin IB Triminicin  Aspirin plain, buffered or enteric coated Durasal Myochrisine Trigesic  Aspirin Suppositories Easprin Nalfon Trillsate  Aspirin with Codeine Ecotrin Regular or Extra Strength Naprosyn Uracel  Atromid-S Efficin Naproxen Ursinus  Auranofin Capsules Elmiron Neocylate Vanquish  Axotal Emagrin Norgesic Verin  Azathioprine Empirin or Empirin with Codeine Normiflo Vitamin E  Azolid Emprazil Nuprin Voltaren  Bayer Aspirin plain, buffered or children's or timed BC Tablets or powders Encaprin Orgaran Warfarin Sodium  Buff-a-Comp Enoxaparin Orudis Zorpin  Buff-a-Comp with Codeine Equegesic Os-Cal-Gesic   Buffaprin Excedrin plain, buffered or Extra Strength Oxalid   Bufferin Arthritis Strength Feldene Oxphenbutazone   Bufferin plain or Extra Strength Feldene Capsules Oxycodone with Aspirin    Bufferin with Codeine Fenoprofen Fenoprofen Pabalate or Pabalate-SF   Buffets II Flogesic Panagesic   Buffinol plain or Extra Strength Florinal or Florinal with Codeine Panwarfarin   Buf-Tabs Flurbiprofen Penicillamine   Butalbital Compound Four-way cold tablets Penicillin   Butazolidin Fragmin Pepto-Bismol   Carbenicillin Geminisyn Percodan   Carna Arthritis Reliever Geopen Persantine   Carprofen Gold's salt Persistin   Chloramphenicol Goody's Phenylbutazone   Chloromycetin Haltrain Piroxlcam   Clmetidine heparin Plaquenil   Cllnoril Hyco-pap Ponstel   Clofibrate Hydroxy chloroquine Propoxyphen         Before stopping any of these medications, be sure to consult the physician who ordered them.  Some, such as Coumadin (Warfarin) are ordered to prevent or treat serious conditions such as "deep thrombosis", "pumonary embolisms", and other heart problems.  The amount of time that you may need off of the medication may also vary with the medication and the reason for which you were taking it.  If you are taking any of these medications, please make sure you notify your pain physician before you undergo any procedures.

## 2016-03-18 NOTE — Patient Instructions (Addendum)
You were given instructions on how to titrate Gabapentin. You were given 3 prescriptions for oxycodone. Scripts for Dulcolax, Colace, Flexeril, Neurontin, and Benefiber were e-scribed to your pharmacy.Radiofrequency Lesioning Radiofrequency lesioning is a procedure that is performed to relieve pain. The procedure is often used for back, neck, or arm pain. Radiofrequency lesioning involves the use of a machine that creates radio waves to make heat. During the procedure, the heat is applied to the nerve that carries the pain signal. The heat damages the nerve and interferes with the pain signal. Pain relief usually lasts for 6 months to 1 year. LET [facility] CARE PROVIDER KNOW ABOUT:  Any allergies you have.  All medicines you are taking, including vitamins, herbs, eye drops, creams, and over-the-counter medicines.  Previous problems you or members of your family have had with the use of anesthetics.  Any blood disorders you have.  Previous surgeries you have had.  Any medical conditions you have.  Whether you are pregnant or may be pregnant. RISKS AND COMPLICATIONS Generally, this is a safe procedure. However, problems may occur, including:  Pain or soreness at the injection site.  Infection at the injection site.  Damage to nerves or blood vessels. BEFORE THE PROCEDURE  Ask your health care provider about:  Changing or stopping your regular medicines. This is especially important if you are taking diabetes medicines or blood thinners.  Taking medicines such as aspirin and ibuprofen. These medicines can thin your blood. Do not take these medicines before your procedure if your health care provider instructs you not to.  Follow instructions from your health care provider about eating or drinking restrictions.  Plan to have someone take you home after the procedure.  If you go home right after the procedure, plan to have someone with you for 24 hours. PROCEDURE  You will be  given one or more of the following:  A medicine to help you relax (sedative).  A medicine to numb the area (local anesthetic).  You will be awake during the procedure. You will need to be able to talk with the health care provider during the procedure.  With the help of a type of X-ray (fluoroscopy), the health care provider will insert a radiofrequency needle into the area to be treated.  Next, a wire that carries the radio waves (electrode) will be put through the radiofrequency needle. An electrical pulse will be sent through the electrode to verify the correct nerve. You will feel a tingling sensation, and you may have muscle twitching.  Then, the tissue that is around the needle tip will be heated by an electric current that is passed using the radiofrequency machine. This will numb the nerves.  A bandage (dressing) will be put on the insertion area after the procedure is done. The procedure may vary among health care providers and hospitals. AFTER THE PROCEDURE  Your blood pressure, heart rate, breathing rate, and blood oxygen level will be monitored often until the medicines you were given have worn off.  Return to your normal activities as directed by your health care provider.   This information is not intended to replace advice given to you by your health care provider. Make sure you discuss any questions you have with your health care provider.   Document Released: 04/28/2011 Document Revised: 05/21/2015 Document Reviewed: 10/07/2014 Elsevier Interactive Patient Education 2016 [place]  What are the risk, side effects and possible complications? Generally speaking, most procedures are safe.  However,  with any procedure there are risks, side effects, and the possibility of complications.  The risks and complications are dependent upon the sites that are lesioned, or the type of nerve block to be performed.  The closer the procedure is to  the spine, the more serious the risks are.  Great care is taken when placing the radio frequency needles, block needles or lesioning probes, but sometimes complications can occur.  Infection: Any time there is an injection through the skin, there is a risk of infection.  This is why sterile conditions are used for these blocks.  There are four possible types of infection.  Localized skin infection.  Central Nervous System Infection-This can be in the form of Meningitis, which can be deadly.  Epidural Infections-This can be in the form of an epidural abscess, which can cause pressure inside of the spine, causing compression of the spinal cord with subsequent paralysis. This would require an emergency surgery to decompress, and there are no guarantees that the patient would recover from the paralysis.  Discitis-This is an infection of the intervertebral discs.  It occurs in about 1% of discography procedures.  It is difficult to treat and it may lead to surgery.        2. Pain: the needles have to go through skin and soft tissues, will cause soreness.       3. Damage to internal structures:  The nerves to be lesioned may be near blood vessels or    other nerves which can be potentially damaged.       4. Bleeding: Bleeding is more common if the patient is taking blood thinners such as  aspirin, Coumadin, Ticiid, Plavix, etc., or if he/she have some genetic predisposition  such as hemophilia. Bleeding into the spinal canal can cause compression of the spinal  cord with subsequent paralysis.  This would require an emergency surgery to  decompress and there are no guarantees that the patient would recover from the  paralysis.       5. Pneumothorax:  Puncturing of a lung is a possibility, every time a needle is introduced in  the area of the chest or upper back.  Pneumothorax refers to free air around the  collapsed lung(s), inside of the thoracic cavity (chest cavity).  Another two possible  complications  related to a similar event would include: Hemothorax and Chylothorax.   These are variations of the Pneumothorax, where instead of air around the collapsed  lung(s), you may have blood or chyle, respectively.       6. Spinal headaches: They may occur with any procedures in the area of the spine.       7. Persistent CSF (Cerebro-Spinal Fluid) leakage: This is a rare problem, but may occur  with prolonged intrathecal or epidural catheters either due to the formation of a fistulous  track or a dural tear.       8. Nerve damage: By working so close to the spinal cord, there is always a possibility of  nerve damage, which could be as serious as a permanent spinal cord injury with  paralysis.       9. Death:  Although rare, severe deadly allergic reactions known as "Anaphylactic  reaction" can occur to any of the medications used.      10. Worsening of the symptoms:  We can always make thing worse.  What are the chances of something like this happening? Chances of any of this occuring are extremely low.  By  statistics, you have more of a chance of getting killed in a motor vehicle accident: while driving to the hospital than any of the above occurring .  Nevertheless, you should be aware that they are possibilities.  In general, it is similar to taking a shower.  Everybody knows that you can slip, hit your head and get killed.  Does that mean that you should not shower again?  Nevertheless always keep in mind that statistics do not mean anything if you happen to be on the wrong side of them.  Even if a procedure has a 1 (one) in a 1,000,000 (million) chance of going wrong, it you happen to be that one..Also, keep in mind that by statistics, you have more of a chance of having something go wrong when taking medications.  Who should not have this procedure? If you are on a blood thinning medication (e.g. Coumadin, Plavix, see list of "Blood Thinners"), or if you have an active infection going on, you should not  have the procedure.  If you are taking any blood thinners, please inform your physician.  How should I prepare for this procedure?  Do not eat or drink anything at least six hours prior to the procedure.  Bring a driver with you .  It cannot be a taxi.  Come accompanied by an adult that can drive you back, and that is strong enough to help you if your legs get weak or numb from the local anesthetic.  Take all of your medicines the morning of the procedure with just enough water to swallow them.  If you have diabetes, make sure that you are scheduled to have your procedure done first thing in the morning, whenever possible.  If you have diabetes, take only half of your insulin dose and notify our nurse that you have done so as soon as you arrive at the clinic.  If you are diabetic, but only take blood sugar pills (oral hypoglycemic), then do not take them on the morning of your procedure.  You may take them after you have had the procedure.  Do not take aspirin or any aspirin-containing medications, at least eleven (11) days prior to the procedure.  They may prolong bleeding.  Wear loose fitting clothing that may be easy to take off and that you would not mind if it got stained with Betadine or blood.  Do not wear any jewelry or perfume  Remove any nail coloring.  It will interfere with some of our monitoring equipment.  NOTE: Remember that this is not meant to be interpreted as a complete list of all possible complications.  Unforeseen problems may occur.  BLOOD THINNERS The following drugs contain aspirin or other products, which can cause increased bleeding during surgery and should not be taken for 2 weeks prior to and 1 week after surgery.  If you should need take something for relief of minor pain, you may take acetaminophen which is found in Tylenol,m Datril, Anacin-3 and Panadol. It is not blood thinner. The products listed below are.  Do not take any of the products listed below  in addition to any listed on your instruction sheet.  A.P.C or A.P.C with Codeine Codeine Phosphate Capsules #3 Ibuprofen Ridaura  ABC compound Congesprin Imuran rimadil  Advil Cope Indocin Robaxisal  Alka-Seltzer Effervescent Pain Reliever and Antacid Coricidin or Coricidin-D  Indomethacin Rufen  Alka-Seltzer plus Cold Medicine Cosprin Ketoprofen S-A-C Tablets  Anacin Analgesic Tablets or Capsules Coumadin Korlgesic Salflex  Anacin Extra  Strength Analgesic tablets or capsules CP-2 Tablets Lanoril Salicylate  Anaprox Cuprimine Capsules Levenox Salocol  Anexsia-D Dalteparin Magan Salsalate  Anodynos Darvon compound Magnesium Salicylate Sine-off  Ansaid Dasin Capsules Magsal Sodium Salicylate  Anturane Depen Capsules Marnal Soma  APF Arthritis pain formula Dewitt's Pills Measurin Stanback  Argesic Dia-Gesic Meclofenamic Sulfinpyrazone  Arthritis Bayer Timed Release Aspirin Diclofenac Meclomen Sulindac  Arthritis pain formula Anacin Dicumarol Medipren Supac  Analgesic (Safety coated) Arthralgen Diffunasal Mefanamic Suprofen  Arthritis Strength Bufferin Dihydrocodeine Mepro Compound Suprol  Arthropan liquid Dopirydamole Methcarbomol with Aspirin Synalgos  ASA tablets/Enseals Disalcid Micrainin Tagament  Ascriptin Doan's Midol Talwin  Ascriptin A/D Dolene Mobidin Tanderil  Ascriptin Extra Strength Dolobid Moblgesic Ticlid  Ascriptin with Codeine Doloprin or Doloprin with Codeine Momentum Tolectin  Asperbuf Duoprin Mono-gesic Trendar  Aspergum Duradyne Motrin or Motrin IB Triminicin  Aspirin plain, buffered or enteric coated Durasal Myochrisine Trigesic  Aspirin Suppositories Easprin Nalfon Trillsate  Aspirin with Codeine Ecotrin Regular or Extra Strength Naprosyn Uracel  Atromid-S Efficin Naproxen Ursinus  Auranofin Capsules Elmiron Neocylate Vanquish  Axotal Emagrin Norgesic Verin  Azathioprine Empirin or Empirin with Codeine Normiflo Vitamin E  Azolid Emprazil Nuprin Voltaren  Bayer  Aspirin plain, buffered or children's or timed BC Tablets or powders Encaprin Orgaran Warfarin Sodium  Buff-a-Comp Enoxaparin Orudis Zorpin  Buff-a-Comp with Codeine Equegesic Os-Cal-Gesic   Buffaprin Excedrin plain, buffered or Extra Strength Oxalid   Bufferin Arthritis Strength Feldene Oxphenbutazone   Bufferin plain or Extra Strength Feldene Capsules Oxycodone with Aspirin   Bufferin with Codeine Fenoprofen Fenoprofen Pabalate or Pabalate-SF   Buffets II Flogesic Panagesic   Buffinol plain or Extra Strength Florinal or Florinal with Codeine Panwarfarin   Buf-Tabs Flurbiprofen Penicillamine   Butalbital Compound Four-way cold tablets Penicillin   Butazolidin Fragmin Pepto-Bismol   Carbenicillin Geminisyn Percodan   Carna Arthritis Reliever Geopen Persantine   Carprofen Gold's salt Persistin   Chloramphenicol Goody's Phenylbutazone   Chloromycetin Haltrain Piroxlcam   Clmetidine heparin Plaquenil   Cllnoril Hyco-pap Ponstel   Clofibrate Hydroxy chloroquine Propoxyphen         Before stopping any of these medications, be sure to consult the physician who ordered them.  Some, such as Coumadin (Warfarin) are ordered to prevent or treat serious conditions such as "deep thrombosis", "pumonary embolisms", and other heart problems.  The amount of time that you may need off of the medication may also vary with the medication and the reason for which you were taking it.  If you are taking any of these medications, please make sure you notify your pain physician before you undergo any procedures.

## 2016-04-13 ENCOUNTER — Encounter: Payer: Self-pay | Admitting: Pain Medicine

## 2016-04-13 ENCOUNTER — Encounter (INDEPENDENT_AMBULATORY_CARE_PROVIDER_SITE_OTHER): Payer: Self-pay

## 2016-04-13 ENCOUNTER — Ambulatory Visit: Payer: BLUE CROSS/BLUE SHIELD | Attending: Pain Medicine | Admitting: Pain Medicine

## 2016-04-13 VITALS — BP 120/87 | HR 72 | Temp 97.7°F | Resp 12 | Ht 69.0 in | Wt 190.0 lb

## 2016-04-13 DIAGNOSIS — K5903 Drug induced constipation: Secondary | ICD-10-CM | POA: Insufficient documentation

## 2016-04-13 DIAGNOSIS — E7800 Pure hypercholesterolemia, unspecified: Secondary | ICD-10-CM | POA: Diagnosis not present

## 2016-04-13 DIAGNOSIS — M791 Myalgia: Secondary | ICD-10-CM | POA: Diagnosis not present

## 2016-04-13 DIAGNOSIS — M47812 Spondylosis without myelopathy or radiculopathy, cervical region: Secondary | ICD-10-CM | POA: Insufficient documentation

## 2016-04-13 DIAGNOSIS — M4726 Other spondylosis with radiculopathy, lumbar region: Secondary | ICD-10-CM | POA: Insufficient documentation

## 2016-04-13 DIAGNOSIS — M25552 Pain in left hip: Secondary | ICD-10-CM | POA: Diagnosis present

## 2016-04-13 DIAGNOSIS — M542 Cervicalgia: Secondary | ICD-10-CM | POA: Diagnosis not present

## 2016-04-13 DIAGNOSIS — R531 Weakness: Secondary | ICD-10-CM | POA: Diagnosis not present

## 2016-04-13 DIAGNOSIS — M545 Low back pain: Secondary | ICD-10-CM | POA: Diagnosis not present

## 2016-04-13 DIAGNOSIS — Z79891 Long term (current) use of opiate analgesic: Secondary | ICD-10-CM | POA: Insufficient documentation

## 2016-04-13 DIAGNOSIS — Z8546 Personal history of malignant neoplasm of prostate: Secondary | ICD-10-CM | POA: Insufficient documentation

## 2016-04-13 DIAGNOSIS — M533 Sacrococcygeal disorders, not elsewhere classified: Secondary | ICD-10-CM | POA: Insufficient documentation

## 2016-04-13 DIAGNOSIS — M25551 Pain in right hip: Secondary | ICD-10-CM | POA: Diagnosis present

## 2016-04-13 DIAGNOSIS — M47816 Spondylosis without myelopathy or radiculopathy, lumbar region: Secondary | ICD-10-CM | POA: Insufficient documentation

## 2016-04-13 DIAGNOSIS — I429 Cardiomyopathy, unspecified: Secondary | ICD-10-CM | POA: Insufficient documentation

## 2016-04-13 DIAGNOSIS — M25519 Pain in unspecified shoulder: Secondary | ICD-10-CM | POA: Insufficient documentation

## 2016-04-13 DIAGNOSIS — G629 Polyneuropathy, unspecified: Secondary | ICD-10-CM | POA: Insufficient documentation

## 2016-04-13 DIAGNOSIS — M546 Pain in thoracic spine: Secondary | ICD-10-CM | POA: Diagnosis not present

## 2016-04-13 DIAGNOSIS — M4802 Spinal stenosis, cervical region: Secondary | ICD-10-CM | POA: Insufficient documentation

## 2016-04-13 DIAGNOSIS — G8929 Other chronic pain: Secondary | ICD-10-CM | POA: Diagnosis not present

## 2016-04-13 DIAGNOSIS — M25561 Pain in right knee: Secondary | ICD-10-CM | POA: Insufficient documentation

## 2016-04-13 DIAGNOSIS — M25579 Pain in unspecified ankle and joints of unspecified foot: Secondary | ICD-10-CM | POA: Diagnosis not present

## 2016-04-13 MED ORDER — FENTANYL CITRATE (PF) 100 MCG/2ML IJ SOLN
INTRAMUSCULAR | Status: AC
  Administered 2016-04-13: 100 ug via INTRAVENOUS
  Filled 2016-04-13: qty 2

## 2016-04-13 MED ORDER — TRIAMCINOLONE ACETONIDE 40 MG/ML IJ SUSP
INTRAMUSCULAR | Status: AC
  Administered 2016-04-13: 14:00:00
  Filled 2016-04-13: qty 2

## 2016-04-13 MED ORDER — ROPIVACAINE HCL 2 MG/ML IJ SOLN: 9.0000 mL | Freq: Once | INTRAMUSCULAR | Status: DC

## 2016-04-13 MED ORDER — LACTATED RINGERS IV SOLN: 1000.0000 mL | Freq: Once | INTRAVENOUS | Status: DC

## 2016-04-13 MED ORDER — METHYLPREDNISOLONE ACETATE 80 MG/ML IJ SUSP
INTRAMUSCULAR | Status: AC
  Administered 2016-04-13: 14:00:00
  Filled 2016-04-13: qty 1

## 2016-04-13 MED ORDER — LIDOCAINE HCL (PF) 1 % IJ SOLN: 10.0000 mL | Freq: Once | INTRAMUSCULAR | Status: DC

## 2016-04-13 MED ORDER — TRIAMCINOLONE ACETONIDE 40 MG/ML IJ SUSP: 40.0000 mg | Freq: Once | INTRAMUSCULAR | Status: DC

## 2016-04-13 MED ORDER — MIDAZOLAM HCL 5 MG/5ML IJ SOLN: 1.0000 mg | INTRAMUSCULAR | Status: DC | PRN

## 2016-04-13 MED ORDER — METHYLPREDNISOLONE ACETATE 80 MG/ML IJ SUSP: 80.0000 mg | Freq: Once | INTRAMUSCULAR | Status: DC

## 2016-04-13 MED ORDER — MIDAZOLAM HCL 5 MG/5ML IJ SOLN
INTRAMUSCULAR | Status: AC
  Administered 2016-04-13: 4 mg via INTRAVENOUS
  Filled 2016-04-13: qty 5

## 2016-04-13 MED ORDER — FENTANYL CITRATE (PF) 100 MCG/2ML IJ SOLN: 25.0000 ug | INTRAMUSCULAR | Status: DC | PRN

## 2016-04-13 MED ORDER — ROPIVACAINE HCL 2 MG/ML IJ SOLN
INTRAMUSCULAR | Status: AC
  Administered 2016-04-13: 14:00:00
  Filled 2016-04-13: qty 30

## 2016-04-13 NOTE — Patient Instructions (Signed)

## 2016-04-13 NOTE — Progress Notes (Signed)
Patient's Name: Ricky Mercer  Patient type: Established  MRN: [id number]  Service setting: Ambulatory outpatient  DOB: December 21, 1950  Location: [facility]  DOS: 04/13/2016  Primary Care Physician: Sofie Hartigan, MD  Note by: Kathlen Brunswick. Dossie Arbour, M.D, DABA, DABAPM, DABPM, DABIPP, FIPP  Referring Physician: Sofie Hartigan, MD  Specialty: Board-Certified Interventional Pain Management  Last Visit to Pain Management: 03/18/2016   Primary Reason(s) for Visit: Interventional Pain Management Treatment. CC: Back Pain (lower) and Hip Pain (bilateral, but left is worse)  Chronic low back pain [M54.5, G89.29]   Procedure:  Anesthesia, Analgesia, Anxiolysis:  Procedure #1: Type: Diagnostic Medial Branch Facet Block Region: Lumbar Level: L2, L3, L4, L5, & S1 Medial Branch Level(s) Laterality: Bilateral  Procedure #2: Type: Diagnostic Sacroiliac Joint Block Region: Posterior Lumbosacral Level: PSIS (Posterior Superior Iliac Spine) Sacroiliac Joint Laterality: Bilateral  Indications: 1. Chronic low back pain  (Location of Primary Pain) (Bilateral) (L>R)   2. Lumbar facet syndrome (Bilateral) (L>R)   3. Chronic sacroiliac joint pain (Bilateral) (L>R)   4. Lumbar spondylosis, unspecified spinal osteoarthritis     Pre-procedure Pain Score: 5/10 Reported level of pain is compatible with clinical observations Post-procedure Pain Score: 5   Type: Moderate (Conscious) Sedation & Local Anesthesia Local Anesthetic: Lidocaine 1% Route: Intravenous (IV) IV Access: Secured Sedation: Meaningful verbal contact was maintained at all times during the procedure  Indication(s): Analgesia & Anxiolysis   Pre-Procedure Assessment  Ricky Mercer is a 65 y.o. year old, male patient, seen today for interventional treatment. 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) (Bilateral S1 dermatomal distribution); Chronic hip pain (Location of Secondary source of pain) (Bilateral) (L>R); Chronic upper back pain (Bilateral) (L>R); Chronic neck pain (neck<shoulder) (L>R); Chronic foot pain; Chronic knee pain (Right); Cervical spondylosis; 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); Chronic upper extremity pain (Bilateral) (L>R); Lumbar spondylosis; Cervical facet hypertrophy; Cervical facet syndrome (R>L); History of prostate cancer; Radicular pain of shoulder; Class I Morbid obesity (HCC) (68% higher incidence of chronic low back problems); Peripheral neuropathy (HCC) (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); Lumbar facet syndrome (Bilateral) (L>R); Sleep apnea; Depression; High cholesterol; Low testosterone; History of surgical procedure; Therapeutic opioid-induced constipation (OIC); Therapeutic opioid induced constipation; Chronic shoulder pain (Bilateral) (L>R); Breathing orally; Chest pain; Primary cardiomyopathy ([place]); Chronic sacroiliac joint pain (Bilateral) (L>R); Musculoskeletal pain; Pain of right foot; Neurogenic pain; and Chest pain with high risk for cardiac etiology on his problem list.. His primarily concern today is the Back Pain (lower) and Hip Pain (bilateral, but left is worse)   Pain Type: Chronic pain Pain Location: Back Pain Orientation: Lower Pain Descriptors / Indicators: Sharp Pain Frequency: Constant  Date of Last Visit: 03/18/16 Service Provided on Last Visit: Med Refill  Coagulation Parameters Lab Results  Component Value Date   PLT 238 11/25/2015    Verification of the correct person, correct site (including marking of site), and correct  procedure were performed and confirmed by the patient.  Consent: Secured. Under the influence of no sedatives a written informed consent was obtained, after having provided information on the risks and possible complications. To fulfill our ethical and legal  obligations, as recommended by the American Medical Association's Code of Ethics, we have provided information to the patient about our clinical impression; the nature and purpose of the treatment or procedure; the risks, benefits, and possible complications of the intervention; alternatives; the risk(s) and benefit(s) of the alternative treatment(s) or procedure(s); and the risk(s) and benefit(s) of doing nothing. The patient was provided information about the risks and possible complications associated with the procedure. These include, but are not limited to, failure to achieve desired goals, infection, bleeding, organ or nerve damage, allergic reactions, paralysis, and death. In the case of spinal procedures these may include, but are not limited to, failure to achieve desired goals, infection, bleeding, organ or nerve damage, allergic reactions, paralysis, and death. In the case of intra- or periarticular procedures these may include, but are not limited to, failure to achieve desired goals, infection, bleeding (hemarthrosis), organ or nerve damage, allergic reactions, and death. In addition, the patient was informed that Medicine is not an exact science; therefore, there is also the possibility of unforeseen risks and possible complications that may result in a catastrophic outcome. The patient indicated having understood very clearly. We have given the patient no guarantees and we have made no promises. Enough time was given to the patient to ask questions, all of which were answered to the patient's satisfaction.  Consent Attestation: I, the ordering provider, attest that I have discussed with the patient the benefits, risks, side-effects,  alternatives, likelihood of achieving goals, and potential problems during recovery for the procedure that I have provided informed consent.  Pre-Procedure Preparation: Safety Precautions: Allergies reviewed. Appropriate site, procedure, and patient were confirmed by following the Joint Commission's Universal Protocol (UP.01.01.01), in the form of a "Time Out". The patient was asked to confirm marked site and procedure, before commencing. The patient was asked about blood thinners, or active infections, both of which were denied. Patient was assessed for positional comfort and all pressure points were checked before starting procedure. Allergies: He has No Known Allergies.. Infection Control Precautions: Sterile technique used. Standard Universal Precautions were taken as recommended by the Department of [facility] for Disease Control and Prevention (CDC). Standard pre-surgical skin prep was conducted. Respiratory hygiene and cough etiquette was practiced. Hand hygiene observed. Safe injection practices and needle disposal techniques followed. SDV (single dose vial) medications used. Medications properly checked for expiration dates and contaminants. Personal protective equipment (PPE) used: Sterile Radiation-resistant gloves. Monitoring:  As per clinic protocol. Vitals:   04/13/16 1358 04/13/16 1405 04/13/16 1412 04/13/16 1421  BP: 103/84 126/88 134/86 120/87  Pulse: 74 76 68 72  Resp: 12 (!) 9 15 12   Temp:  97.7 F (36.5 C)    TempSrc:  Temporal    SpO2: 98% 98% 99% 99%  Weight:      Height:      Calculated BMI: Body mass index is 28.06 kg/m.  Description of Procedure #1 Process:   Time-out: "Time-out" completed before starting procedure, as per protocol. Position: Prone Target Area: For Lumbar Facet blocks, the target is the groove formed by the junction of the transverse process and superior articular process. For the L5 dorsal ramus, the target is the notch between superior  articular process and sacral ala. For the S1 dorsal ramus, the target is the superior and lateral edge of the posterior S1 Sacral foramen. Approach: Paramedial approach. Area Prepped: Entire Posterior Lumbosacral Region Prepping solution: ChloraPrep (2% chlorhexidine gluconate and 70% isopropyl alcohol) Safety Precautions: Aspiration looking for blood  return was conducted prior to all injections. At no point did we inject any substances, as a needle was being advanced. No attempts were made at seeking any paresthesias. Safe injection practices and needle disposal techniques used. Medications properly checked for expiration dates. SDV (single dose vial) medications used.     Description of the Procedure: Protocol guidelines were followed. The patient was placed in position over the fluoroscopy table. The target area was identified and the area prepped in the usual manner. Skin desensitized using vapocoolant spray. Skin & deeper tissues infiltrated with local anesthetic. Appropriate amount of time allowed to pass for local anesthetics to take effect. The procedure needle was introduced through the skin, ipsilateral to the reported pain, and advanced to the target area. Employing the "Medial Branch Technique", the needles were advanced to the angle made by the superior and medial portion of the transverse process, and the lateral and inferior portion of the superior articulating process of the targeted vertebral bodies. This area is known as "Burton's Eye" or the "Eye of the [place] Dog". A procedure needle was introduced through the skin, and this time advanced to the angle made by the superior and medial border of the sacral ala, and the lateral border of the S1 vertebral body. This last needle was later repositioned at the superior and lateral border of the posterior S1 foramen. Negative aspiration confirmed. Solution injected in intermittent fashion, asking for systemic symptoms every 0.5cc of injectate. The  needles were then removed and the area cleansed, making sure to leave some of the prepping solution back to take advantage of its long term bactericidal properties. Materials & Medications Used:  Needle(s) Used: 22g - 3.5" Spinal Needle(s)  Description of Procedure # 2 Process:   Time-out: "Time-out" completed before starting procedure, as per protocol. Position: Prone Target Area: For upper sacroiliac joint block(s), the target is the superior and posterior margin of the sacroiliac joint. Approach: Ipsilateral approach. Area Prepped: Entire Posterior Lumbosacral Region Prepping solution: Duraprep (Iodine Povacrylex [0.7% available Iodine] and Isopropyl Alcohol, 74% w/w) Safety Precautions: Aspiration looking for blood return was conducted prior to all injections. At no point did we inject any substances, as a needle was being advanced. No attempts were made at seeking any paresthesias. Safe injection practices and needle disposal techniques used. Medications properly checked for expiration dates. SDV (single dose vial) medications used. Description of the Procedure: Protocol guidelines were followed. The patient was placed in position over the fluoroscopy table. The target area was identified and the area prepped in the usual manner. Skin desensitized using vapocoolant spray. Skin & deeper tissues infiltrated with local anesthetic. Appropriate amount of time allowed to pass for local anesthetics to take effect. The procedure needle was advanced under fluoroscopic guidance into the sacroiliac joint until a firm endpoint was obtained. Proper needle placement secured. Negative aspiration confirmed. Solution injected in intermittent fashion, asking for systemic symptoms every 0.5cc of injectate. The needles were then removed and the area cleansed, making sure to leave some of the prepping solution back to take advantage of its long term bactericidal properties. Materials & Medications Used:  Needle(s)  Used: 22g - 3.5" Spinal Needle(s)  EBL: None Medications Administered today: We administered ropivacaine (PF) 2 mg/ml (0.2%), methylPREDNISolone acetate, triamcinolone acetonide, fentaNYL, and midazolam.Please see chart orders for dosing details.  Imaging Guidance:   Type of Imaging Technique: Fluoroscopy Guidance (Spinal) Indication(s): Assistance in needle guidance and placement for procedures requiring needle placement in or near specific anatomical locations not  easily accessible without such assistance. Exposure Time: Please see nurses notes. Contrast: None required. Fluoroscopic Guidance: I was personally present in the fluoroscopy suite, where the patient was placed in position for the procedure, over the fluoroscopy-compatible table. Fluoroscopy was manipulated, using "Tunnel Vision Technique", to obtain the best possible view of the target area, on the affected side. Parallax error was corrected before commencing the procedure. A "direction-depth-direction" technique was used to introduce the needle under continuous pulsed fluoroscopic guidance. Once the target was reached, antero-posterior, oblique, and lateral fluoroscopic projection views were taken to confirm needle placement in all planes. Permanently recorded images stored by scanning into EMR. Interpretation: Intraoperative imaging interpretation by performing Physician. Adequate needle placement confirmed. Adequate needle placement confirmed in AP, lateral, & Oblique Views. No contrast injected.  Antibiotic Prophylaxis:  Indication(s): No indications identified. Type:  Antibiotics Given (last 72 hours)    None       Post-operative Assessment:   Complications: No immediate post-treatment complications were observed. Disposition: Return to clinic for follow-up evaluation. The patient tolerated the entire procedure well. A repeat set of vitals were taken after the procedure and the patient was kept under observation following  institutional policy, for this procedure. Post-procedural neurological assessment was performed, showing return to baseline, prior to discharge. The patient was discharged home, once institutional criteria were met. The patient was provided with post-procedure discharge instructions, including a section on how to identify potential problems. Should any problems arise concerning this procedure, the patient was given instructions to immediately contact us, at any time, without hesitation. In any case, we plan to contact the patient by telephone for a follow-up status report regarding this interventional procedure. Comments:  No additional relevant information.  Medications administered during this visit: We administered ropivacaine (PF) 2 mg/ml (0.2%), methylPREDNISolone acetate, triamcinolone acetonide, fentaNYL, and midazolam.  Prescriptions ordered during this visit: New Prescriptions   No medications on file    Future Appointments Date Time Provider [place]  05/04/2016 9:20 AM Milinda Pointer, MD [facility] None  06/07/2016 1:20 PM Milinda Pointer, MD [facility] None    Primary Care Physician: [facility], Chrissie Noa, MD Location: [facility] Note by: Kathlen Brunswick. Dossie Arbour, M.D, DABA, DABAPM, DABPM, DABIPP, FIPP  Disclaimer:  Medicine is not an exact science. The only guarantee in medicine is that nothing is guaranteed. It is important to note that the decision to proceed with this intervention was based on the information collected from the patient. The Data and conclusions were drawn from the patient's questionnaire, the interview, and the physical examination. Because the information was provided in large part by the patient, it cannot be guaranteed that it has not been purposely or unconsciously manipulated. Every effort has been made to obtain as much relevant data as possible for this evaluation. It is important to note that the conclusions that lead to  this procedure are derived in large part from the available data. Always take into account that the treatment will also be dependent on availability of resources and existing treatment guidelines, considered by other Pain Management Practitioners as being common knowledge and practice, at the time of the intervention. For Medico-Legal purposes, it is also important to point out that variation in procedural techniques and pharmacological choices are the acceptable norm. The indications, contraindications, technique, and results of the above procedure should only be interpreted and judged by a Board-Certified Interventional Pain Specialist with extensive familiarity and expertise in the same exact procedure and technique. Attempts at providing opinions  without similar or greater experience and expertise than that of the treating physician will be considered as inappropriate and unethical, and shall result in a formal complaint to the state medical board and applicable specialty societies.

## 2016-04-13 NOTE — Progress Notes (Signed)
Safety precautions to be maintained throughout the outpatient stay will include: orient to surroundings, keep bed in low position, maintain call bell within reach at all times, provide assistance with transfer out of bed and ambulation.  

## 2016-04-14 ENCOUNTER — Telehealth: Payer: Self-pay | Admitting: *Deleted

## 2016-04-14 NOTE — Telephone Encounter (Signed)
Attempted to reach patient, no voicemail capability.  °

## 2016-05-04 ENCOUNTER — Encounter: Payer: Self-pay | Admitting: Pain Medicine

## 2016-05-04 ENCOUNTER — Ambulatory Visit: Payer: BLUE CROSS/BLUE SHIELD | Attending: Pain Medicine | Admitting: Pain Medicine

## 2016-05-04 VITALS — BP 138/96 | HR 94 | Temp 97.8°F | Resp 18 | Ht 69.0 in | Wt 190.0 lb

## 2016-05-04 DIAGNOSIS — M25551 Pain in right hip: Secondary | ICD-10-CM | POA: Diagnosis not present

## 2016-05-04 DIAGNOSIS — R531 Weakness: Secondary | ICD-10-CM | POA: Insufficient documentation

## 2016-05-04 DIAGNOSIS — E785 Hyperlipidemia, unspecified: Secondary | ICD-10-CM | POA: Diagnosis not present

## 2016-05-04 DIAGNOSIS — Z85828 Personal history of other malignant neoplasm of skin: Secondary | ICD-10-CM | POA: Insufficient documentation

## 2016-05-04 DIAGNOSIS — M546 Pain in thoracic spine: Secondary | ICD-10-CM | POA: Diagnosis not present

## 2016-05-04 DIAGNOSIS — Z9889 Other specified postprocedural states: Secondary | ICD-10-CM | POA: Insufficient documentation

## 2016-05-04 DIAGNOSIS — K219 Gastro-esophageal reflux disease without esophagitis: Secondary | ICD-10-CM | POA: Diagnosis not present

## 2016-05-04 DIAGNOSIS — Z8546 Personal history of malignant neoplasm of prostate: Secondary | ICD-10-CM | POA: Diagnosis not present

## 2016-05-04 DIAGNOSIS — K5903 Drug induced constipation: Secondary | ICD-10-CM | POA: Diagnosis not present

## 2016-05-04 DIAGNOSIS — F1190 Opioid use, unspecified, uncomplicated: Secondary | ICD-10-CM | POA: Diagnosis not present

## 2016-05-04 DIAGNOSIS — M545 Low back pain: Secondary | ICD-10-CM | POA: Insufficient documentation

## 2016-05-04 DIAGNOSIS — M47812 Spondylosis without myelopathy or radiculopathy, cervical region: Secondary | ICD-10-CM | POA: Diagnosis not present

## 2016-05-04 DIAGNOSIS — K5790 Diverticulosis of intestine, part unspecified, without perforation or abscess without bleeding: Secondary | ICD-10-CM | POA: Insufficient documentation

## 2016-05-04 DIAGNOSIS — G8929 Other chronic pain: Secondary | ICD-10-CM | POA: Insufficient documentation

## 2016-05-04 DIAGNOSIS — I429 Cardiomyopathy, unspecified: Secondary | ICD-10-CM | POA: Insufficient documentation

## 2016-05-04 DIAGNOSIS — M542 Cervicalgia: Secondary | ICD-10-CM | POA: Diagnosis not present

## 2016-05-04 DIAGNOSIS — I428 Other cardiomyopathies: Secondary | ICD-10-CM | POA: Diagnosis not present

## 2016-05-04 DIAGNOSIS — M533 Sacrococcygeal disorders, not elsewhere classified: Secondary | ICD-10-CM

## 2016-05-04 DIAGNOSIS — F329 Major depressive disorder, single episode, unspecified: Secondary | ICD-10-CM | POA: Diagnosis not present

## 2016-05-04 DIAGNOSIS — M79606 Pain in leg, unspecified: Secondary | ICD-10-CM | POA: Diagnosis not present

## 2016-05-04 DIAGNOSIS — M25552 Pain in left hip: Secondary | ICD-10-CM | POA: Diagnosis present

## 2016-05-04 DIAGNOSIS — G4730 Sleep apnea, unspecified: Secondary | ICD-10-CM | POA: Diagnosis not present

## 2016-05-04 DIAGNOSIS — M5416 Radiculopathy, lumbar region: Secondary | ICD-10-CM | POA: Insufficient documentation

## 2016-05-04 DIAGNOSIS — Z79891 Long term (current) use of opiate analgesic: Secondary | ICD-10-CM | POA: Insufficient documentation

## 2016-05-04 DIAGNOSIS — M791 Myalgia: Secondary | ICD-10-CM | POA: Insufficient documentation

## 2016-05-04 DIAGNOSIS — R079 Chest pain, unspecified: Secondary | ICD-10-CM | POA: Insufficient documentation

## 2016-05-04 DIAGNOSIS — M25559 Pain in unspecified hip: Secondary | ICD-10-CM | POA: Diagnosis not present

## 2016-05-04 DIAGNOSIS — M47816 Spondylosis without myelopathy or radiculopathy, lumbar region: Secondary | ICD-10-CM

## 2016-05-04 NOTE — Progress Notes (Signed)
Safety precautions to be maintained throughout the outpatient stay will include: orient to surroundings, keep bed in low position, maintain call bell within reach at all times, provide assistance with transfer out of bed and ambulation.   Oxycodone 5 mg #7 out of 180 remaining. Filled 03-24-16.

## 2016-05-04 NOTE — Patient Instructions (Addendum)
GENERAL RISKS AND COMPLICATIONS  What are the risk, side effects and possible complications? Generally speaking, most procedures are safe.  However, with any procedure there are risks, side effects, and the possibility of complications.  The risks and complications are dependent upon the sites that are lesioned, or the type of nerve block to be performed.  The closer the procedure is to the spine, the more serious the risks are.  Great care is taken when placing the radio frequency needles, block needles or lesioning probes, but sometimes complications can occur. 1. Infection: Any time there is an injection through the skin, there is a risk of infection.  This is why sterile conditions are used for these blocks.  There are four possible types of infection. 1. Localized skin infection. 2. Central Nervous System Infection-This can be in the form of Meningitis, which can be deadly. 3. Epidural Infections-This can be in the form of an epidural abscess, which can cause pressure inside of the spine, causing compression of the spinal cord with subsequent paralysis. This would require an emergency surgery to decompress, and there are no guarantees that the patient would recover from the paralysis. 4. Discitis-This is an infection of the intervertebral discs.  It occurs in about 1% of discography procedures.  It is difficult to treat and it may lead to surgery.        2. Pain: the needles have to go through skin and soft tissues, will cause soreness.       3. Damage to internal structures:  The nerves to be lesioned may be near blood vessels or    other nerves which can be potentially damaged.       4. Bleeding: Bleeding is more common if the patient is taking blood thinners such as  aspirin, Coumadin, Ticiid, Plavix, etc., or if he/she have some genetic predisposition  such as hemophilia. Bleeding into the spinal canal can cause compression of the spinal  cord with subsequent paralysis.  This would require an  emergency surgery to  decompress and there are no guarantees that the patient would recover from the  paralysis.       5. Pneumothorax:  Puncturing of a lung is a possibility, every time a needle is introduced in  the area of the chest or upper back.  Pneumothorax refers to free air around the  collapsed lung(s), inside of the thoracic cavity (chest cavity).  Another two possible  complications related to a similar event would include: Hemothorax and Chylothorax.   These are variations of the Pneumothorax, where instead of air around the collapsed  lung(s), you may have blood or chyle, respectively.       6. Spinal headaches: They may occur with any procedures in the area of the spine.       7. Persistent CSF (Cerebro-Spinal Fluid) leakage: This is a rare problem, but may occur  with prolonged intrathecal or epidural catheters either due to the formation of a fistulous  track or a dural tear.       8. Nerve damage: By working so close to the spinal cord, there is always a possibility of  nerve damage, which could be as serious as a permanent spinal cord injury with  paralysis.       9. Death:  Although rare, severe deadly allergic reactions known as "Anaphylactic  reaction" can occur to any of the medications used.      10. Worsening of the symptoms:  We can always make thing worse.    What are the chances of something like this happening? Chances of any of this occuring are extremely low.  By statistics, you have more of a chance of getting killed in a motor vehicle accident: while driving to the hospital than any of the above occurring .  Nevertheless, you should be aware that they are possibilities.  In general, it is similar to taking a shower.  Everybody knows that you can slip, hit your head and get killed.  Does that mean that you should not shower again?  Nevertheless always keep in mind that statistics do not mean anything if you happen to be on the wrong side of them.  Even if a procedure has a 1  (one) in a 1,000,000 (million) chance of going wrong, it you happen to be that one..Also, keep in mind that by statistics, you have more of a chance of having something go wrong when taking medications.  Who should not have this procedure? If you are on a blood thinning medication (e.g. Coumadin, Plavix, see list of "Blood Thinners"), or if you have an active infection going on, you should not have the procedure.  If you are taking any blood thinners, please inform your physician.  How should I prepare for this procedure?  Do not eat or drink anything at least six hours prior to the procedure.  Bring a driver with you .  It cannot be a taxi.  Come accompanied by an adult that can drive you back, and that is strong enough to help you if your legs get weak or numb from the local anesthetic.  Take all of your medicines the morning of the procedure with just enough water to swallow them.  If you have diabetes, make sure that you are scheduled to have your procedure done first thing in the morning, whenever possible.  If you have diabetes, take only half of your insulin dose and notify our nurse that you have done so as soon as you arrive at the clinic.  If you are diabetic, but only take blood sugar pills (oral hypoglycemic), then do not take them on the morning of your procedure.  You may take them after you have had the procedure.  Do not take aspirin or any aspirin-containing medications, at least eleven (11) days prior to the procedure.  They may prolong bleeding.  Wear loose fitting clothing that may be easy to take off and that you would not mind if it got stained with Betadine or blood.  Do not wear any jewelry or perfume  Remove any nail coloring.  It will interfere with some of our monitoring equipment.  NOTE: Remember that this is not meant to be interpreted as a complete list of all possible complications.  Unforeseen problems may occur.  BLOOD THINNERS The following drugs  contain aspirin or other products, which can cause increased bleeding during surgery and should not be taken for 2 weeks prior to and 1 week after surgery.  If you should need take something for relief of minor pain, you may take acetaminophen which is found in Tylenol,m Datril, Anacin-3 and Panadol. It is not blood thinner. The products listed below are.  Do not take any of the products listed below in addition to any listed on your instruction sheet.  A.P.C or A.P.C with Codeine Codeine Phosphate Capsules #3 Ibuprofen Ridaura  ABC compound Congesprin Imuran rimadil  Advil Cope Indocin Robaxisal  Alka-Seltzer Effervescent Pain Reliever and Antacid Coricidin or Coricidin-D  Indomethacin Rufen    Alka-Seltzer plus Cold Medicine Cosprin Ketoprofen S-A-C Tablets  Anacin Analgesic Tablets or Capsules Coumadin Korlgesic Salflex  Anacin Extra Strength Analgesic tablets or capsules CP-2 Tablets Lanoril Salicylate  Anaprox Cuprimine Capsules Levenox Salocol  Anexsia-D Dalteparin Magan Salsalate  Anodynos Darvon compound Magnesium Salicylate Sine-off  Ansaid Dasin Capsules Magsal Sodium Salicylate  Anturane Depen Capsules Marnal Soma  APF Arthritis pain formula Dewitt's Pills Measurin Stanback  Argesic Dia-Gesic Meclofenamic Sulfinpyrazone  Arthritis Bayer Timed Release Aspirin Diclofenac Meclomen Sulindac  Arthritis pain formula Anacin Dicumarol Medipren Supac  Analgesic (Safety coated) Arthralgen Diffunasal Mefanamic Suprofen  Arthritis Strength Bufferin Dihydrocodeine Mepro Compound Suprol  Arthropan liquid Dopirydamole Methcarbomol with Aspirin Synalgos  ASA tablets/Enseals Disalcid Micrainin Tagament  Ascriptin Doan's Midol Talwin  Ascriptin A/D Dolene Mobidin Tanderil  Ascriptin Extra Strength Dolobid Moblgesic Ticlid  Ascriptin with Codeine Doloprin or Doloprin with Codeine Momentum Tolectin  Asperbuf Duoprin Mono-gesic Trendar  Aspergum Duradyne Motrin or Motrin IB Triminicin  Aspirin  plain, buffered or enteric coated Durasal Myochrisine Trigesic  Aspirin Suppositories Easprin Nalfon Trillsate  Aspirin with Codeine Ecotrin Regular or Extra Strength Naprosyn Uracel  Atromid-S Efficin Naproxen Ursinus  Auranofin Capsules Elmiron Neocylate Vanquish  Axotal Emagrin Norgesic Verin  Azathioprine Empirin or Empirin with Codeine Normiflo Vitamin E  Azolid Emprazil Nuprin Voltaren  Bayer Aspirin plain, buffered or children's or timed BC Tablets or powders Encaprin Orgaran Warfarin Sodium  Buff-a-Comp Enoxaparin Orudis Zorpin  Buff-a-Comp with Codeine Equegesic Os-Cal-Gesic   Buffaprin Excedrin plain, buffered or Extra Strength Oxalid   Bufferin Arthritis Strength Feldene Oxphenbutazone   Bufferin plain or Extra Strength Feldene Capsules Oxycodone with Aspirin   Bufferin with Codeine Fenoprofen Fenoprofen Pabalate or Pabalate-SF   Buffets II Flogesic Panagesic   Buffinol plain or Extra Strength Florinal or Florinal with Codeine Panwarfarin   Buf-Tabs Flurbiprofen Penicillamine   Butalbital Compound Four-way cold tablets Penicillin   Butazolidin Fragmin Pepto-Bismol   Carbenicillin Geminisyn Percodan   Carna Arthritis Reliever Geopen Persantine   Carprofen Gold's salt Persistin   Chloramphenicol Goody's Phenylbutazone   Chloromycetin Haltrain Piroxlcam   Clmetidine heparin Plaquenil   Cllnoril Hyco-pap Ponstel   Clofibrate Hydroxy chloroquine Propoxyphen         Before stopping any of these medications, be sure to consult the physician who ordered them.  Some, such as Coumadin (Warfarin) are ordered to prevent or treat serious conditions such as "deep thrombosis", "pumonary embolisms", and other heart problems.  The amount of time that you may need off of the medication may also vary with the medication and the reason for which you were taking it.  If you are taking any of these medications, please make sure you notify your pain physician before you undergo any  procedures.         Radiofrequency Lesioning Radiofrequency lesioning is a procedure that is performed to relieve pain. The procedure is often used for back, neck, or arm pain. Radiofrequency lesioning involves the use of a machine that creates radio waves to make heat. During the procedure, the heat is applied to the nerve that carries the pain signal. The heat damages the nerve and interferes with the pain signal. Pain relief usually lasts for 6 months to 1 year. LET [facility] CARE PROVIDER KNOW ABOUT:  Any allergies you have.  All medicines you are taking, including vitamins, herbs, eye drops, creams, and over-the-counter medicines.  Previous problems you or members of your family have had with the use of anesthetics.  Any blood disorders you have.  Previous surgeries you have had.  Any medical conditions you have.  Whether you are pregnant or may be pregnant. RISKS AND COMPLICATIONS Generally, this is a safe procedure. However, problems may occur, including:  Pain or soreness at the injection site.  Infection at the injection site.  Damage to nerves or blood vessels. BEFORE THE PROCEDURE  Ask your health care provider about:  Changing or stopping your regular medicines. This is especially important if you are taking diabetes medicines or blood thinners.  Taking medicines such as aspirin and ibuprofen. These medicines can thin your blood. Do not take these medicines before your procedure if your health care provider instructs you not to.  Follow instructions from your health care provider about eating or drinking restrictions.  Plan to have someone take you home after the procedure.  If you go home right after the procedure, plan to have someone with you for 24 hours. PROCEDURE  You will be given one or more of the following:  A medicine to help you relax (sedative).  A medicine to numb the area (local anesthetic).  You will be awake during the procedure.  You will need to be able to talk with the health care provider during the procedure.  With the help of a type of X-ray (fluoroscopy), the health care provider will insert a radiofrequency needle into the area to be treated.  Next, a wire that carries the radio waves (electrode) will be put through the radiofrequency needle. An electrical pulse will be sent through the electrode to verify the correct nerve. You will feel a tingling sensation, and you may have muscle twitching.  Then, the tissue that is around the needle tip will be heated by an electric current that is passed using the radiofrequency machine. This will numb the nerves.  A bandage (dressing) will be put on the insertion area after the procedure is done. The procedure may vary among health care providers and hospitals. AFTER THE PROCEDURE  Your blood pressure, heart rate, breathing rate, and blood oxygen level will be monitored often until the medicines you were given have worn off.  Return to your normal activities as directed by your health care provider.   This information is not intended to replace advice given to you by your health care provider. Make sure you discuss any questions you have with your health care provider.   Document Released: 04/28/2011 Document Revised: 05/21/2015 Document Reviewed: 10/07/2014 [employer] Interactive Patient Education [employer].

## 2016-05-04 NOTE — Progress Notes (Signed)
Patient's Name: Ricky Mercer  Patient type: Established  MRN: [id number]  Service setting: Ambulatory outpatient  DOB: [birth year]  Location: [facility]  DOS: 05/04/2016  Primary Care Physician: Sofie Hartigan, MD  Note by: Kathlen Brunswick. Dossie Arbour, M.D, DABA, DABAPM, DABPM, DABIPP, FIPP  Referring Physician: Sofie Hartigan, MD  Specialty: Board-Certified Interventional Pain Management  Last Visit to Pain Management: 04/14/2016   Primary Reason(s) for Visit: Encounter for prescription drug management & post-procedure evaluation of chronic illness with mild to moderate exacerbation(Level of risk: moderate) CC: Back Pain (lower) and Hip Pain (bilateral)   HPI  [name] is a 65 y.o. year old, male patient, who returns today as an established patient. He has Hand weakness; Polyradiculopathy; Chronic pain; Long term current use of opiate analgesic; Long term prescription opiate use; Opiate use (45 MME/Day); Opiate dependence ([place]); Encounter for therapeutic drug level monitoring; Chronic low back pain  (Location of Primary Pain) (Bilateral) (L>R); Chronic lower extremity pain (Location of Tertiary source of pain) (Bilateral) (L>R); Chronic lumbar radicular pain (Bilateral) (L>R) (Bilateral S1 dermatomal distribution); Chronic hip pain (Location of Secondary source of pain) (Bilateral) (L>R); Chronic upper back pain (Bilateral) (L>R); Chronic neck pain (neck<shoulder) (L>R); Chronic foot pain; Chronic knee pain (Right); Cervical spondylosis; 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); Chronic upper extremity pain (Bilateral) (L>R); Lumbar spondylosis; Cervical facet hypertrophy; Cervical facet syndrome (R>L); History of prostate cancer; Radicular pain of shoulder; Class I Morbid obesity (HCC) (68% higher incidence of chronic low back problems); Peripheral neuropathy (HCC) (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); Lumbar facet syndrome (Bilateral)  (L>R); Sleep apnea; Depression; High cholesterol; Low testosterone; History of surgical procedure; Therapeutic opioid-induced constipation (OIC); Therapeutic opioid induced constipation; Chronic shoulder pain (Bilateral) (L>R); Breathing orally; Chest pain; Primary cardiomyopathy ([place]); Chronic sacroiliac joint pain (Bilateral) (L>R); Musculoskeletal pain; Pain of right foot; Neurogenic pain; and Chest pain with high risk for cardiac etiology on his problem list.. His primarily concern today is the Back Pain (lower) and Hip Pain (bilateral)   Pain Assessment: Self-Reported Pain Score: 5  Clinically the patient looks like a 2/10 Reported level is inconsistent with clinical obrservations Information on the proper use of the pain score provided to the patient today. Pain Type: Chronic pain Pain Location: Back Pain Orientation: Lower Pain Descriptors / Indicators: Nagging Pain Frequency: Constant  The patient comes into the clinics today for post-procedure evaluation on the interventional treatment done on 04/13/2016. In addition, he comes in today for pharmacological management of his chronic pain.  The patient  reports that he does not use drugs. The patient returns to the clinic today after having had her second diagnostic bilateral lumbar facet and sacroiliac joint block under fluoroscopic guidance and IV sedation.  Date of Last Visit: 04/13/16 Service Provided on Last Visit: Procedure  Controlled Substance Pharmacotherapy Assessment & REMS (Risk Evaluation and Mitigation Strategy)  Analgesic: Oxycodone IR 10 mg every 8 hours (30 mg/day) MME/day: 45 mg/day. Pill Count: The patient was not here today to have his medications refilled. He has enough medication to last until 06/16/2016. Pharmacokinetics: Onset of action (Liberation/Absorption): Within expected pharmacological parameters Time to Peak effect (Distribution): Timing and results are as within normal expected parameters Duration of  action (Metabolism/Excretion): Within normal limits for medication Pharmacodynamics: Analgesic Effect: More than 50% Activity Facilitation: Medication(s) allow patient to sit, stand, walk, and do the basic ADLs Perceived Effectiveness: Described as relatively effective, allowing for increase in activities of daily living (  ADL) Side-effects or Adverse reactions: None reported Monitoring: [place] PMP: Online review of the past 14-month period conducted. Compliant with practice rules and regulations Last UDS on record: ToxAssure Select 13  Date Value Ref Range Status  12/23/2015 FINAL  Final    Comment:    ==================================================================== TOXASSURE SELECT 13 (MW) ==================================================================== Test                             Result       Flag       Units Drug Present and Declared for Prescription Verification   Oxycodone                      77           EXPECTED   ng/mg creat   Oxymorphone                    886          EXPECTED   ng/mg creat   Noroxycodone                   404          EXPECTED   ng/mg creat   Noroxymorphone                 329          EXPECTED   ng/mg creat    Sources of oxycodone are scheduled prescription medications.    Oxymorphone, noroxycodone, and noroxymorphone are expected    metabolites of oxycodone. Oxymorphone is also available as a    scheduled prescription medication. ==================================================================== Test                      Result    Flag   Units      Ref Range   Creatinine              160              mg/dL      >=20 ==================================================================== Declared Medications:  The flagging and interpretation on this report are based on the  following declared medications.  Unexpected results may arise from  inaccuracies in the declared medications.  **Note: The testing scope of this panel includes these  medications:  Oxycodone  **Note: The testing scope of this panel does not include following  reported medications:  Acetaminophen  Aspirin  Bisacodyl  Citalopram  Diphenhydramine  Docusate  Pantoprazole  Simvastatin  Supplement  Testosterone ==================================================================== For clinical consultation, please call [phone]. ====================================================================    UDS interpretation: Compliant          Medication Assessment Form: Reviewed. Patient indicates being compliant with therapy Treatment compliance: Compliant Risk Assessment: Aberrant Behavior: None observed today Substance Use Disorder (SUD) Risk Level: Low Risk of opioid abuse or dependence: 0.7-3.0% with doses ? 36 MME/day and 6.1-26% with doses ? 120 MME/day. Opioid Risk Tool (ORT) Score: Total Score: 0 Low Risk for SUD (Score <3) Depression Scale Score: PHQ-2: PHQ-2 Total Score: 0 No depression (0) PHQ-9: PHQ-9 Total Score: 0 No depression (0-4)  Pharmacologic Plan: No change in therapy, at this time  Post-Procedure Assessment  Procedure done on last visit: diagnostic bilateral lumbar facet and sacroiliac joint block under fluoroscopic guidance and IV sedation Side-effects or Adverse reactions: None reported Sedation: Please see nurses note  Results: Ultra-Short Term Relief (First 1 hour  after procedure): 50 % upon personally evaluating the patient he indicates that he did get 100% relief of the pain for the duration of local anesthetic. Analgesia during this period is likely to be Local Anesthetic and/or IV Sedative (Analgesic/Anxiolitic) related Short Term Relief (Initial 4-6 hrs after procedure): 50 % 100% Complete relief confirms area to be the source of pain Long Term Relief : 0 % 50% No benefit could suggest etiology to be non-inflammatory, possibly compressive   Current Relief (Now): 0%  Recurrance of pain could suggest persistent  aggravating factors Interpretation of Results: The results of this second diagnostic injection again supports the possibility that most of his low back pain is coming from the facet joints and the sacroiliac joint. At this point, I think it is medically necessary for [place] to move on to the radiofrequency ablation of those structures so as to provide him with longer lasting relief and then move on to the next area that he is having problems with.  Laboratory Chemistry  Inflammation Markers Lab Results  Component Value Date   ESRSEDRATE 4 08/21/2015   CRP <0.5 08/21/2015    Renal Function Lab Results  Component Value Date   BUN 13 11/25/2015   CREATININE 0.96 11/25/2015   GFRAA >60 11/25/2015   GFRNONAA >60 11/25/2015    Hepatic Function Lab Results  Component Value Date   AST 31 11/25/2015   ALT 41 11/25/2015   ALBUMIN 3.9 11/25/2015    Electrolytes Lab Results  Component Value Date   NA 138 11/25/2015   K 4.2 11/25/2015   CL 104 11/25/2015   CALCIUM 9.0 11/25/2015   MG 2.1 08/21/2015    Pain Modulating Vitamins No results found for: Maralyn Sago [id number], [id number], [id number], 25OHVITD1, 25OHVITD2, 25OHVITD3, VITAMINB12  Coagulation Parameters Lab Results  Component Value Date   PLT 238 11/25/2015    Cardiovascular Lab Results  Component Value Date   HGB 14.6 11/25/2015   HCT 43.5 11/25/2015    Note: Lab results reviewed.  Recent Diagnostic Imaging  No results found.  Meds  The patient has a current medication list which includes the following prescription(s): androgel pump, aspirin, bisacodyl, citalopram, cyclobenzaprine, diphenhydramine-acetaminophen, docusate sodium, gabapentin, oxycodone, oxycodone, oxycodone, pantoprazole, simvastatin, sucralfate, and benefiber.  Current Outpatient Prescriptions on File Prior to Visit  Medication Sig  . ANDROGEL PUMP 20.25 MG/ACT (1.62%) GEL APPLY TO 2 PUMPS ONCE A DAY AS DIRECTED  . aspirin 81 MG tablet Take 81 mg by  mouth daily.  . bisacodyl (DULCOLAX) 5 MG EC tablet Take 2 tablets (10 mg total) by mouth daily as needed for moderate constipation.  . citalopram (CELEXA) 10 MG tablet Take 10 mg by mouth daily.  . cyclobenzaprine (FLEXERIL) 10 MG tablet Take 1 tablet (10 mg total) by mouth 3 (three) times daily as needed for muscle spasms.  . diphenhydramine-acetaminophen (TYLENOL PM EXTRA STRENGTH) 25-500 MG TABS tablet Take 1 tablet by mouth at bedtime as needed.  . docusate sodium (COLACE) 100 MG capsule Take 1 capsule (100 mg total) by mouth at bedtime.  . gabapentin (NEURONTIN) 100 MG capsule Take 1-3 capsules (100-300 mg total) by mouth at bedtime. Follow the written titration schedule.  Marland Kitchen oxyCODONE (OXY IR/ROXICODONE) 5 MG immediate release tablet Take 1 tablet (5 mg total) by mouth every 4 (four) hours as needed for severe pain. Maximum: 6/day  . oxyCODONE (OXY IR/ROXICODONE) 5 MG immediate release tablet Take 1 tablet (5 mg total) by mouth every 4 (four) hours as needed for severe  pain. Maximum: 6/day  . oxyCODONE (OXY IR/ROXICODONE) 5 MG immediate release tablet Take 1 tablet (5 mg total) by mouth every 4 (four) hours as needed for severe pain. Maximum: 6/day  . pantoprazole (PROTONIX) 40 MG tablet Take 40 mg by mouth daily.  . simvastatin (ZOCOR) 10 MG tablet Take 10 mg by mouth at bedtime.   . sucralfate (CARAFATE) 1 g tablet Take by mouth.  . Wheat Dextrin (BENEFIBER) POWD Stir 2 tsp. TID into 4-8 oz of any non-carbonated beverage or soft food (hot or cold)   No current facility-administered medications on file prior to visit.     ROS  Constitutional: Denies any fever or chills Gastrointestinal: No reported hemesis, hematochezia, vomiting, or acute GI distress Musculoskeletal: Denies any acute onset joint swelling, redness, loss of ROM, or weakness Neurological: No reported episodes of acute onset apraxia, aphasia, dysarthria, agnosia, amnesia, paralysis, loss of coordination, or loss of  consciousness  Allergies  Mr. Lynes has No Known Allergies.  [place]  Medical:  Mr. Bato  has a past medical history of Bilateral arm pain; Cardiomyopathy ([place]); Diverticulosis; Dupuytren's contracture; Foot pain, bilateral; GERD (gastroesophageal reflux disease); Hand weakness; Hyperlipidemia; Knee pain; Midline low back pain; Osteoarthritis; Pain in neck; Prostate cancer ([place]); Reactive airway disease; Seasonal allergies; Situational anxiety; and Sleep apnea. Family: family history includes Colon cancer in his brother; Hyperlipidemia in his sister; Lung cancer in his father; Prostate cancer in his brother. Surgical:  has a past surgical history that includes Nose surgery; Hand surgery (Bilateral); Colonoscopy (10/24/2012); Upper gi endoscopy (12/18/2012); Tonsillectomy (2009); Skin cancer excision (Left, 2015); Mass excision (Right, 07/18/2015); Esophagogastroduodenoscopy (egd) with propofol (N/A, 10/28/2015); prostate seeding; Hernia repair; and Cholecystectomy (N/A, 12/02/2015). Tobacco:  reports that he has never smoked. He has never used smokeless tobacco. Alcohol:  reports that he does not drink alcohol. Drug:  reports that he does not use drugs.  Constitutional Exam  Vitals: Blood pressure (!) 138/96, pulse 94, temperature 97.8 F (36.6 C), temperature source Oral, resp. rate 18, height 5\' 9"  (1.753 m), weight 190 lb (86.2 kg), SpO2 100 %. General appearance: Well nourished, well developed, and well hydrated. In no acute distress Calculated BMI/Body habitus: Body mass index is 28.06 kg/m. (25-29.9 kg/m2) Overweight - 20% higher incidence of chronic pain Psych/Mental status: Alert and oriented x 3 (person, place, & time) Eyes: PERLA Respiratory: No evidence of acute respiratory distress  Cervical Spine Exam  Inspection: No masses, redness, or swelling Alignment: Symmetrical Functional ROM: ROM appears unrestricted Stability: No instability detected Muscle strength & Tone:  Functionally intact Sensory: Unimpaired Palpation: Non-contributory  Upper Extremity (UE) Exam    Side: Right upper extremity  Side: Left upper extremity  Inspection: No masses, redness, swelling, or asymmetry  Inspection: No masses, redness, swelling, or asymmetry  Functional ROM: ROM appears unrestricted  Functional ROM: ROM appears unrestricted  Muscle strength & Tone: Functionally intact  Muscle strength & Tone: Functionally intact  Sensory: Unimpaired  Sensory: Unimpaired  Palpation: Non-contributory  Palpation: Non-contributory   Thoracic Spine Exam  Inspection: No masses, redness, or swelling Alignment: Symmetrical Functional ROM: ROM appears unrestricted Stability: No instability detected Sensory: Unimpaired Muscle strength & Tone: Functionally intact Palpation: Non-contributory  Lumbar Spine Exam  Inspection: No masses, redness, or swelling Alignment: Symmetrical Functional ROM: Minimal ROM Stability: No instability detected Muscle strength & Tone: Increased muscle tone over affected area Sensory: Movement-associated pain Palpation: Complains of area being tender to palpation Provocative Tests: Lumbar Hyperextension and rotation test: Positive bilaterally for  facet joint pain. Patrick's Maneuver: Positive for bilateral S-I joint pain           Gait & Posture Assessment  Ambulation: Unassisted Gait: Relatively normal for age and body habitus Posture: WNL   Lower Extremity Exam    Side: Right lower extremity  Side: Left lower extremity  Inspection: No masses, redness, swelling, or asymmetry  Inspection: No masses, redness, swelling, or asymmetry  Functional ROM: ROM appears unrestricted  Functional ROM: ROM appears unrestricted  Muscle strength & Tone: Functionally intact  Muscle strength & Tone: Functionally intact  Sensory: Unimpaired  Sensory: Unimpaired  Palpation: Non-contributory  Palpation: Non-contributory    Assessment & Plan  Primary Diagnosis &  Pertinent Problem List: The primary encounter diagnosis was Chronic pain. Diagnoses of Long term current use of opiate analgesic, Opiate use (45 MME/Day), Chronic low back pain  (Location of Primary Pain) (Bilateral) (L>R), Chronic hip pain, unspecified laterality, Chronic pain of lower extremity, unspecified laterality, Chronic sacroiliac joint pain (Bilateral) (L>R), and Lumbar facet syndrome (Bilateral) (L>R) were also pertinent to this visit.  Visit Diagnosis: 1. Chronic pain   2. Long term current use of opiate analgesic   3. Opiate use (45 MME/Day)   4. Chronic low back pain  (Location of Primary Pain) (Bilateral) (L>R)   5. Chronic hip pain, unspecified laterality   6. Chronic pain of lower extremity, unspecified laterality   7. Chronic sacroiliac joint pain (Bilateral) (L>R)   8. Lumbar facet syndrome (Bilateral) (L>R)     Problems updated and reviewed during this visit: No problems updated.  Problem-specific Plan(s): No problem-specific Assessment & Plan notes found for this encounter.  No new Assessment & Plan notes have been filed under this hospital service since the last note was generated. Service: Pain Management   Plan of Care   Problem List Items Addressed This Visit      High   Chronic hip pain (Location of Secondary source of pain) (Bilateral) (L>R) (Chronic)   Chronic low back pain  (Location of Primary Pain) (Bilateral) (L>R) (Chronic)   Relevant Orders   Radiofrequency,Lumbar   Radiofrequency Sacroiliac Joint   Chronic lower extremity pain (Location of Tertiary source of pain) (Bilateral) (L>R) (Chronic)   Chronic pain - Primary (Chronic)   Chronic sacroiliac joint pain (Bilateral) (L>R) (Chronic)   Relevant Orders   Radiofrequency Sacroiliac Joint   Lumbar facet syndrome (Bilateral) (L>R) (Chronic)   Relevant Orders   Radiofrequency,Lumbar     Medium   Long term current use of opiate analgesic (Chronic)   Opiate use (45 MME/Day) (Chronic)    Other  Visit Diagnoses   None.      Pharmacotherapy (Medications Ordered): No orders of the defined types were placed in this encounter.   Lab-work & Procedure Ordered: Orders Placed This Encounter  Procedures  . Radiofrequency,Lumbar  . Radiofrequency Sacroiliac Joint    Imaging Ordered: None  Interventional Therapies: Scheduled:  Left sided lumbar facet + sacroiliac joint radiofrequency ablation under fluoroscopic guidance and IV sedation.    Considering:  Left sided lumbar facet + sacroiliac joint radiofrequency ablation under fluoroscopic guidance and IV sedation.  Right-sided lumbar facet + sacroiliac joint radiofrequency ablation under fluoroscopic guidance and IV sedation. We will plan to do this too is to 6 weeks after the left side.  Diagnostic Bilateral Hip Block Possible Bilateral Hip RFA. Diagnostic bilateral cervical facet block. Possible bilateral cervical facet RFA. Diagnostic Caudal ESI. Diagnostic Left CESI. Diagnostic Right Knee injection. Diagnostic right Genicular NB  Possible right Genicular Nerve RFA. Diagnostic bilateral intra-articular shoulder inj. Diagnostic bilateral suprascapular NB. Possible bilateral suprascapular Nerve RFA.   PRN Procedures:   Diagnostic Bilateral Lumbar Facet & Bilateral S-I joint block under fluoroscopy and IV sedation. Diagnostic Bilateral Hip Block Diagnostic bilateral cervical facet block. Diagnostic Caudal ESI. Diagnostic Left CESI. Diagnostic Right Knee injection. Diagnostic right Genicular NB Diagnostic bilateral intra-articular shoulder inj. Diagnostic bilateral suprascapular NB.   Referral(s) or Consult(s): None at this time.  New Prescriptions   No medications on file    Medications administered during this visit: Mr. Kham had no medications administered during this visit.  Requested PM Follow-up: Return for Schedule Procedure, (ASAP).  Future Appointments Date Time Provider Portal   06/07/2016 1:20 PM Milinda Pointer, MD [facility] None    Primary Care Physician: [facility], Chrissie Noa, MD Location: [facility] Note by: Kathlen Brunswick. Dossie Arbour, M.D, DABA, DABAPM, DABPM, DABIPP, FIPP  Pain Score Disclaimer: We use the NRS-11 scale. This is a self-reported, subjective measurement of pain severity with only modest accuracy. It is used primarily to identify changes within a particular patient. It must be understood that outpatient pain scales are significantly less accurate that those used for research, where they can be applied under ideal controlled circumstances with minimal exposure to variables. In reality, the score is likely to be a combination of pain intensity and pain affect, where pain affect describes the degree of emotional arousal or changes in action readiness caused by the sensory experience of pain. Factors such as social and work situation, setting, emotional state, anxiety levels, expectation, and prior pain experience may influence pain perception and show large inter-individual differences that may also be affected by time variables.  Patient instructions provided at this appointment:: Patient Instructions   GENERAL RISKS AND COMPLICATIONS  What are the risk, side effects and possible complications? Generally speaking, most procedures are safe.  However, with any procedure there are risks, side effects, and the possibility of complications.  The risks and complications are dependent upon the sites that are lesioned, or the type of nerve block to be performed.  The closer the procedure is to the spine, the more serious the risks are.  Great care is taken when placing the radio frequency needles, block needles or lesioning probes, but sometimes complications can occur. 1. Infection: Any time there is an injection through the skin, there is a risk of infection.  This is why sterile conditions are used for these blocks.  There are four  possible types of infection. 1. Localized skin infection. 2. Central Nervous System Infection-This can be in the form of Meningitis, which can be deadly. 3. Epidural Infections-This can be in the form of an epidural abscess, which can cause pressure inside of the spine, causing compression of the spinal cord with subsequent paralysis. This would require an emergency surgery to decompress, and there are no guarantees that the patient would recover from the paralysis. 4. Discitis-This is an infection of the intervertebral discs.  It occurs in about 1% of discography procedures.  It is difficult to treat and it may lead to surgery.        2. Pain: the needles have to go through skin and soft tissues, will cause soreness.       3. Damage to internal structures:  The nerves to be lesioned may be near blood vessels or    other nerves which can be potentially damaged.       4. Bleeding: Bleeding  is more common if the patient is taking blood thinners such as  aspirin, Coumadin, Ticiid, Plavix, etc., or if he/she have some genetic predisposition  such as hemophilia. Bleeding into the spinal canal can cause compression of the spinal  cord with subsequent paralysis.  This would require an emergency surgery to  decompress and there are no guarantees that the patient would recover from the  paralysis.       5. Pneumothorax:  Puncturing of a lung is a possibility, every time a needle is introduced in  the area of the chest or upper back.  Pneumothorax refers to free air around the  collapsed lung(s), inside of the thoracic cavity (chest cavity).  Another two possible  complications related to a similar event would include: Hemothorax and Chylothorax.   These are variations of the Pneumothorax, where instead of air around the collapsed  lung(s), you may have blood or chyle, respectively.       6. Spinal headaches: They may occur with any procedures in the area of the spine.       7. Persistent CSF (Cerebro-Spinal  Fluid) leakage: This is a rare problem, but may occur  with prolonged intrathecal or epidural catheters either due to the formation of a fistulous  track or a dural tear.       8. Nerve damage: By working so close to the spinal cord, there is always a possibility of  nerve damage, which could be as serious as a permanent spinal cord injury with  paralysis.       9. Death:  Although rare, severe deadly allergic reactions known as "Anaphylactic  reaction" can occur to any of the medications used.      10. Worsening of the symptoms:  We can always make thing worse.  What are the chances of something like this happening? Chances of any of this occuring are extremely low.  By statistics, you have more of a chance of getting killed in a motor vehicle accident: while driving to the hospital than any of the above occurring .  Nevertheless, you should be aware that they are possibilities.  In general, it is similar to taking a shower.  Everybody knows that you can slip, hit your head and get killed.  Does that mean that you should not shower again?  Nevertheless always keep in mind that statistics do not mean anything if you happen to be on the wrong side of them.  Even if a procedure has a 1 (one) in a 1,000,000 (million) chance of going wrong, it you happen to be that one..Also, keep in mind that by statistics, you have more of a chance of having something go wrong when taking medications.  Who should not have this procedure? If you are on a blood thinning medication (e.g. Coumadin, Plavix, see list of "Blood Thinners"), or if you have an active infection going on, you should not have the procedure.  If you are taking any blood thinners, please inform your physician.  How should I prepare for this procedure?  Do not eat or drink anything at least six hours prior to the procedure.  Bring a driver with you .  It cannot be a taxi.  Come accompanied by an adult that can drive you back, and that is strong  enough to help you if your legs get weak or numb from the local anesthetic.  Take all of your medicines the morning of the procedure with just enough water to swallow them.  If you have diabetes, make sure that you are scheduled to have your procedure done first thing in the morning, whenever possible.  If you have diabetes, take only half of your insulin dose and notify our nurse that you have done so as soon as you arrive at the clinic.  If you are diabetic, but only take blood sugar pills (oral hypoglycemic), then do not take them on the morning of your procedure.  You may take them after you have had the procedure.  Do not take aspirin or any aspirin-containing medications, at least eleven (11) days prior to the procedure.  They may prolong bleeding.  Wear loose fitting clothing that may be easy to take off and that you would not mind if it got stained with Betadine or blood.  Do not wear any jewelry or perfume  Remove any nail coloring.  It will interfere with some of our monitoring equipment.  NOTE: Remember that this is not meant to be interpreted as a complete list of all possible complications.  Unforeseen problems may occur.  BLOOD THINNERS The following drugs contain aspirin or other products, which can cause increased bleeding during surgery and should not be taken for 2 weeks prior to and 1 week after surgery.  If you should need take something for relief of minor pain, you may take acetaminophen which is found in Tylenol,m Datril, Anacin-3 and Panadol. It is not blood thinner. The products listed below are.  Do not take any of the products listed below in addition to any listed on your instruction sheet.  A.P.C or A.P.C with Codeine Codeine Phosphate Capsules #3 Ibuprofen Ridaura  ABC compound Congesprin Imuran rimadil  Advil Cope Indocin Robaxisal  Alka-Seltzer Effervescent Pain Reliever and Antacid Coricidin or Coricidin-D  Indomethacin Rufen  Alka-Seltzer plus Cold  Medicine Cosprin Ketoprofen S-A-C Tablets  Anacin Analgesic Tablets or Capsules Coumadin Korlgesic Salflex  Anacin Extra Strength Analgesic tablets or capsules CP-2 Tablets Lanoril Salicylate  Anaprox Cuprimine Capsules Levenox Salocol  Anexsia-D Dalteparin Magan Salsalate  Anodynos Darvon compound Magnesium Salicylate Sine-off  Ansaid Dasin Capsules Magsal Sodium Salicylate  Anturane Depen Capsules Marnal Soma  APF Arthritis pain formula Dewitt's Pills Measurin Stanback  Argesic Dia-Gesic Meclofenamic Sulfinpyrazone  Arthritis Bayer Timed Release Aspirin Diclofenac Meclomen Sulindac  Arthritis pain formula Anacin Dicumarol Medipren Supac  Analgesic (Safety coated) Arthralgen Diffunasal Mefanamic Suprofen  Arthritis Strength Bufferin Dihydrocodeine Mepro Compound Suprol  Arthropan liquid Dopirydamole Methcarbomol with Aspirin Synalgos  ASA tablets/Enseals Disalcid Micrainin Tagament  Ascriptin Doan's Midol Talwin  Ascriptin A/D Dolene Mobidin Tanderil  Ascriptin Extra Strength Dolobid Moblgesic Ticlid  Ascriptin with Codeine Doloprin or Doloprin with Codeine Momentum Tolectin  Asperbuf Duoprin Mono-gesic Trendar  Aspergum Duradyne Motrin or Motrin IB Triminicin  Aspirin plain, buffered or enteric coated Durasal Myochrisine Trigesic  Aspirin Suppositories Easprin Nalfon Trillsate  Aspirin with Codeine Ecotrin Regular or Extra Strength Naprosyn Uracel  Atromid-S Efficin Naproxen Ursinus  Auranofin Capsules Elmiron Neocylate Vanquish  Axotal Emagrin Norgesic Verin  Azathioprine Empirin or Empirin with Codeine Normiflo Vitamin E  Azolid Emprazil Nuprin Voltaren  Bayer Aspirin plain, buffered or children's or timed BC Tablets or powders Encaprin Orgaran Warfarin Sodium  Buff-a-Comp Enoxaparin Orudis Zorpin  Buff-a-Comp with Codeine Equegesic Os-Cal-Gesic   Buffaprin Excedrin plain, buffered or Extra Strength Oxalid   Bufferin Arthritis Strength Feldene Oxphenbutazone   Bufferin plain or  Extra Strength Feldene Capsules Oxycodone with Aspirin   Bufferin with Codeine Fenoprofen Fenoprofen Pabalate or Pabalate-SF   Buffets II  Flogesic Panagesic   Buffinol plain or Extra Strength Florinal or Florinal with Codeine Panwarfarin   Buf-Tabs Flurbiprofen Penicillamine   Butalbital Compound Four-way cold tablets Penicillin   Butazolidin Fragmin Pepto-Bismol   Carbenicillin Geminisyn Percodan   Carna Arthritis Reliever Geopen Persantine   Carprofen Gold's salt Persistin   Chloramphenicol Goody's Phenylbutazone   Chloromycetin Haltrain Piroxlcam   Clmetidine heparin Plaquenil   Cllnoril Hyco-pap Ponstel   Clofibrate Hydroxy chloroquine Propoxyphen         Before stopping any of these medications, be sure to consult the physician who ordered them.  Some, such as Coumadin (Warfarin) are ordered to prevent or treat serious conditions such as "deep thrombosis", "pumonary embolisms", and other heart problems.  The amount of time that you may need off of the medication may also vary with the medication and the reason for which you were taking it.  If you are taking any of these medications, please make sure you notify your pain physician before you undergo any procedures.         Radiofrequency Lesioning Radiofrequency lesioning is a procedure that is performed to relieve pain. The procedure is often used for back, neck, or arm pain. Radiofrequency lesioning involves the use of a machine that creates radio waves to make heat. During the procedure, the heat is applied to the nerve that carries the pain signal. The heat damages the nerve and interferes with the pain signal. Pain relief usually lasts for 6 months to 1 year. LET [facility] CARE PROVIDER KNOW ABOUT:  Any allergies you have.  All medicines you are taking, including vitamins, herbs, eye drops, creams, and over-the-counter medicines.  Previous problems you or members of your family have had with the use of  anesthetics.  Any blood disorders you have.  Previous surgeries you have had.  Any medical conditions you have.  Whether you are pregnant or may be pregnant. RISKS AND COMPLICATIONS Generally, this is a safe procedure. However, problems may occur, including:  Pain or soreness at the injection site.  Infection at the injection site.  Damage to nerves or blood vessels. BEFORE THE PROCEDURE  Ask your health care provider about:  Changing or stopping your regular medicines. This is especially important if you are taking diabetes medicines or blood thinners.  Taking medicines such as aspirin and ibuprofen. These medicines can thin your blood. Do not take these medicines before your procedure if your health care provider instructs you not to.  Follow instructions from your health care provider about eating or drinking restrictions.  Plan to have someone take you home after the procedure.  If you go home right after the procedure, plan to have someone with you for 24 hours. PROCEDURE  You will be given one or more of the following:  A medicine to help you relax (sedative).  A medicine to numb the area (local anesthetic).  You will be awake during the procedure. You will need to be able to talk with the health care provider during the procedure.  With the help of a type of X-ray (fluoroscopy), the health care provider will insert a radiofrequency needle into the area to be treated.  Next, a wire that carries the radio waves (electrode) will be put through the radiofrequency needle. An electrical pulse will be sent through the electrode to verify the correct nerve. You will feel a tingling sensation, and you may have muscle twitching.  Then, the tissue that is around the needle tip will be heated  by an electric current that is passed using the radiofrequency machine. This will numb the nerves.  A bandage (dressing) will be put on the insertion area after the procedure is  done. The procedure may vary among health care providers and hospitals. AFTER THE PROCEDURE  Your blood pressure, heart rate, breathing rate, and blood oxygen level will be monitored often until the medicines you were given have worn off.  Return to your normal activities as directed by your health care provider.   This information is not intended to replace advice given to you by your health care provider. Make sure you discuss any questions you have with your health care provider.   Document Released: 04/28/2011 Document Revised: 05/21/2015 Document Reviewed: 10/07/2014 [employer] Interactive Patient Education [employer].

## 2016-06-07 ENCOUNTER — Ambulatory Visit: Payer: BLUE CROSS/BLUE SHIELD | Attending: Pain Medicine | Admitting: Pain Medicine

## 2016-06-07 ENCOUNTER — Encounter: Payer: Self-pay | Admitting: Pain Medicine

## 2016-06-07 VITALS — BP 125/86 | HR 75 | Temp 97.7°F | Resp 16 | Ht 69.0 in | Wt 185.0 lb

## 2016-06-07 DIAGNOSIS — K5903 Drug induced constipation: Secondary | ICD-10-CM | POA: Diagnosis not present

## 2016-06-07 DIAGNOSIS — Z8546 Personal history of malignant neoplasm of prostate: Secondary | ICD-10-CM | POA: Insufficient documentation

## 2016-06-07 DIAGNOSIS — G4730 Sleep apnea, unspecified: Secondary | ICD-10-CM | POA: Diagnosis not present

## 2016-06-07 DIAGNOSIS — G8929 Other chronic pain: Secondary | ICD-10-CM | POA: Diagnosis present

## 2016-06-07 DIAGNOSIS — Z923 Personal history of irradiation: Secondary | ICD-10-CM | POA: Diagnosis not present

## 2016-06-07 DIAGNOSIS — M1990 Unspecified osteoarthritis, unspecified site: Secondary | ICD-10-CM | POA: Insufficient documentation

## 2016-06-07 DIAGNOSIS — M720 Palmar fascial fibromatosis [Dupuytren]: Secondary | ICD-10-CM | POA: Diagnosis not present

## 2016-06-07 DIAGNOSIS — K5790 Diverticulosis of intestine, part unspecified, without perforation or abscess without bleeding: Secondary | ICD-10-CM | POA: Diagnosis not present

## 2016-06-07 DIAGNOSIS — K219 Gastro-esophageal reflux disease without esophagitis: Secondary | ICD-10-CM | POA: Diagnosis not present

## 2016-06-07 DIAGNOSIS — M545 Low back pain: Secondary | ICD-10-CM | POA: Insufficient documentation

## 2016-06-07 DIAGNOSIS — I429 Cardiomyopathy, unspecified: Secondary | ICD-10-CM | POA: Diagnosis not present

## 2016-06-07 DIAGNOSIS — M791 Myalgia: Secondary | ICD-10-CM

## 2016-06-07 DIAGNOSIS — F1190 Opioid use, unspecified, uncomplicated: Secondary | ICD-10-CM | POA: Diagnosis not present

## 2016-06-07 DIAGNOSIS — M25559 Pain in unspecified hip: Secondary | ICD-10-CM | POA: Insufficient documentation

## 2016-06-07 DIAGNOSIS — M79606 Pain in leg, unspecified: Secondary | ICD-10-CM

## 2016-06-07 DIAGNOSIS — Z79891 Long term (current) use of opiate analgesic: Secondary | ICD-10-CM | POA: Insufficient documentation

## 2016-06-07 DIAGNOSIS — M7918 Myalgia, other site: Secondary | ICD-10-CM

## 2016-06-07 DIAGNOSIS — T402X5A Adverse effect of other opioids, initial encounter: Secondary | ICD-10-CM

## 2016-06-07 DIAGNOSIS — M792 Neuralgia and neuritis, unspecified: Secondary | ICD-10-CM

## 2016-06-07 MED ORDER — BISACODYL 5 MG PO TBEC: 10.0000 mg | DELAYED_RELEASE_TABLET | Freq: Every day | ORAL | 2 refills | Status: DC | PRN

## 2016-06-07 MED ORDER — GABAPENTIN 100 MG PO CAPS: 100.0000 mg | ORAL_CAPSULE | Freq: Every day | ORAL | 2 refills | Status: DC

## 2016-06-07 MED ORDER — GABAPENTIN 300 MG PO CAPS
300.0000 mg | ORAL_CAPSULE | Freq: Every day | ORAL | 2 refills | Status: DC
Start: 2016-06-07 — End: 2016-08-30

## 2016-06-07 MED ORDER — CYCLOBENZAPRINE HCL 10 MG PO TABS: 10.0000 mg | ORAL_TABLET | Freq: Three times a day (TID) | ORAL | 2 refills | Status: DC | PRN

## 2016-06-07 MED ORDER — OXYCODONE HCL 5 MG PO TABS: 5.0000 mg | ORAL_TABLET | ORAL | 0 refills | Status: DC | PRN

## 2016-06-07 MED ORDER — BENEFIBER PO POWD: ORAL | 2 refills | Status: AC

## 2016-06-07 MED ORDER — DOCUSATE SODIUM 100 MG PO CAPS: 100.0000 mg | ORAL_CAPSULE | Freq: Every day | ORAL | 2 refills | Status: DC

## 2016-06-07 NOTE — Progress Notes (Signed)
Patient's Name: Ricky Mercer  MRN: [id number]  Referring Provider: Sofie Hartigan, MD  DOB: 1951-07-14  PCP: Sofie Hartigan, MD  DOS: 06/07/2016  Note by: Kathlen Brunswick. Dossie Arbour, MD  Service setting: Ambulatory outpatient  Specialty: Interventional Pain Management  Location: [facility] (AMB) Pain Management Facility    Patient type: Established   Primary Reason(s) for Visit: Encounter for prescription drug management (Level of risk: moderate) CC: Back Pain (center, lower)  HPI  [name] is a 65 y.o. year old, male patient, who comes today for an initial evaluation. He has Hand weakness; Polyradiculopathy; Chronic pain; Long term current use of opiate analgesic; Long term prescription opiate use; Opiate use (45 MME/Day); Opiate dependence (Ricky Mercer); Encounter for therapeutic drug level monitoring; Chronic low back pain  (Location of Primary Pain) (Bilateral) (L>R); Chronic lower extremity pain (Location of Tertiary source of pain) (Bilateral) (L>R); Chronic lumbar radicular pain (Bilateral) (L>R) (Bilateral S1 dermatomal distribution); Chronic hip pain (Location of Secondary source of pain) (Bilateral) (L>R); Chronic upper back pain (Bilateral) (L>R); Chronic neck pain (neck<shoulder) (L>R); Chronic foot pain; Chronic knee pain (Right); Cervical spondylosis; 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); Chronic upper extremity pain (Bilateral) (L>R); Lumbar spondylosis; Cervical facet hypertrophy; Cervical facet syndrome (R>L); History of prostate cancer; Radicular pain of shoulder; Class I Morbid obesity (HCC) (68% higher incidence of chronic low back problems); Peripheral neuropathy (HCC) (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); Lumbar facet syndrome (Bilateral) (L>R); Sleep apnea; Depression; High cholesterol; Low testosterone; History of surgical procedure; Therapeutic opioid-induced constipation (OIC); Therapeutic opioid induced constipation; Chronic shoulder pain (Bilateral)  (L>R); Breathing orally; Chest pain; Primary cardiomyopathy ([place]); Chronic sacroiliac joint pain (Bilateral) (L>R); Musculoskeletal pain; Pain of right foot; Neurogenic pain; and Chest pain with high risk for cardiac etiology on his problem list.. His primarily concern today is the Back Pain (center, lower)  Pain Assessment: Self-Reported Pain Score: 5 /10 Clinically the patient looks like a 3/10 Reported level is inconsistent with clinical observations. Information on the proper use of the pain score provided to the patient today. Pain Type: Chronic pain Pain Location: Back Pain Orientation: Lower Pain Descriptors / Indicators: Sharp, Shooting Pain Frequency: Constant  The patient comes into the clinics today for pharmacological management of his chronic pain. I last saw this patient on 05/04/2016. The patient  reports that he does not use drugs. His body mass index is 27.32 kg/m. Today I learned that the patient's insurance company denied the approval for the lumbar facet and SI joint radiofrequency ablation. Once again, they are obstructing proper medical care. The patient is interested in trying to lower the amount of pain that he has so that he can also lower the amount of pain medicine that he needs to take. On 01/08/2016 the patient underwent his first diagnostic bilateral lumbar facet block under fluoroscopic guidance and IV sedation, which provided him with 75% relief of the pain for the duration of the local anesthetics with no long-term benefits from the steroids. Repeat evaluation of the patient's low back pain after this procedure was positive for pain arising from the sacroiliac joints upon performing the provocative Patrick's maneuver. Because of the incomplete relief of the pain from the initial diagnostic injection and the physical exam suggesting involvement of the sacroiliac joints, a second diagnostic block was performed, this time of the lumbar facets and the sacroiliac joints. This  was done on 04/13/2016. Abundant personally reviewing the results, it became clear that the patient had attained 100%  relief of the pain for the duration of local anesthetics with a 50% long-term improvement. Because the patient was initially seen by me had a different facility, this medical record did not contain crucial information regarding his prior treatments and failures. This patient did undergo a trial of 6 weeks of physical therapy at the [facility] [place], behind the [facility]. The patient clearly recalls the therapy as stated significantly worsened his pain instead of helping him. Recently I was told that his insurance company wanted to repeat the physical therapy. Today I did put the order to refer him to physical therapy to be repeated, however, I truly believe that this is not medically indicated and that he will do him more harm than benefit. However, since [facility] is insisting that we follow this protocol, I have put the order to comply with their requirements. 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  Date of Last Visit: 05/04/16 Service Provided on Last Visit: Med Refill  Controlled Substance Pharmacotherapy Assessment & REMS (Risk Evaluation and Mitigation Strategy)  Analgesic:Oxycodone IR 10 mg every 8 hours (30 mg/day) MME/day:45 mg/day. Pill Count: Oxycodone 5 mg 66 out of 180 remaining.Filled 04-26-16. Pharmacokinetics: Onset of action (Liberation/Absorption): Within expected pharmacological parameters Time to Peak effect (Distribution): Timing and results are as within normal expected parameters Duration of action (Metabolism/Excretion): Within normal limits for  medication Pharmacodynamics: Analgesic Effect: More than 50% Activity Facilitation: Medication(s) allow patient to sit, stand, walk, and do the basic ADLs Perceived Effectiveness: Described as relatively effective, allowing for increase in activities of daily living (ADL) Side-effects or Adverse reactions: None reported Monitoring: [place] PMP: Online review of the past 55-month period conducted. Compliant with practice rules and regulations List of all UDS test(s) done:  Lab Results  Component Value Date   TOXASSSELUR FINAL 12/23/2015   TOXASSSELUR FINAL 11/17/2015   TOXASSSELUR FINAL 07/23/2015   Last UDS on record: [employer]ox[employer] Select 13  Date Value Ref Range Status  12/23/2015 FINAL  Final    Comment:    ==================================================================== TOXASSURE SELECT 13 (MW) ==================================================================== Test                             Result       Flag       Units Drug Present and Declared for Prescription Verification   Oxycodone                      77           EXPECTED   ng/mg creat   Oxymorphone                    886          EXPECTED   ng/mg creat   Noroxycodone                   404          EXPECTED   ng/mg creat   Noroxymorphone                 329          EXPECTED   ng/mg creat  Sources of oxycodone are scheduled prescription medications.    Oxymorphone, noroxycodone, and noroxymorphone are expected    metabolites of oxycodone. Oxymorphone is also available as a    scheduled prescription medication. ==================================================================== Test                      Result    Flag   Units      Ref Range   Creatinine              160              mg/dL      >=20 ==================================================================== Declared Medications:  The flagging and interpretation on this report are based on the  following declared medications.  Unexpected results may arise  from  inaccuracies in the declared medications.  **Note: The testing scope of this panel includes these medications:  Oxycodone  **Note: The testing scope of this panel does not include following  reported medications:  Acetaminophen  Aspirin  Bisacodyl  Citalopram  Diphenhydramine  Docusate  Pantoprazole  Simvastatin  Supplement  Testosterone ==================================================================== For clinical consultation, please call [phone]. ====================================================================    UDS interpretation: Compliant          Medication Assessment Form: Reviewed. Patient indicates being compliant with therapy Treatment compliance: Compliant Risk Assessment: Aberrant Behavior: None observed today Substance Use Disorder (SUD) Risk Level: Low-to-moderate Risk of opioid abuse or dependence: 0.7-3.0% with doses ? 36 MME/day and 6.1-26% with doses ? 120 MME/day. Opioid Risk Tool (ORT) Score:  0   Low Risk for SUD (Score <3) Depression Scale Score: PHQ-2: 0   No depression (0) PHQ-9: 0   No depression (0-4)  Pharmacologic Plan: No change in therapy, at this time  Laboratory Chemistry  Inflammation Markers Lab Results  Component Value Date   ESRSEDRATE 4 08/21/2015   CRP <0.5 08/21/2015   Renal Function Lab Results  Component Value Date   BUN 13 11/25/2015   CREATININE 0.96 11/25/2015   GFRAA >60 11/25/2015   GFRNONAA >60 11/25/2015   Hepatic Function Lab Results  Component Value Date   AST 31 11/25/2015   ALT 41 11/25/2015   ALBUMIN 3.9 11/25/2015   Electrolytes Lab Results  Component Value Date   NA 138 11/25/2015   K 4.2 11/25/2015   CL 104 11/25/2015   CALCIUM 9.0 11/25/2015   MG 2.1 08/21/2015   Pain Modulating Vitamins No results found for: Maralyn Sago [id number], [id number], [id number], 25OHVITD1, 25OHVITD2, 25OHVITD3, VITAMINB12 Coagulation Parameters Lab Results  Component Value Date   PLT 238  11/25/2015   Cardiovascular Lab Results  Component Value Date   HGB 14.6 11/25/2015   HCT 43.5 11/25/2015    Note: Lab results reviewed.  Recent Diagnostic Imaging  No results found. Meds  The patient has a current medication list which includes the following prescription(s): androgel pump, aspirin, bisacodyl, citalopram, cyclobenzaprine, diphenhydramine-acetaminophen, docusate sodium, gabapentin, oxycodone, oxycodone, oxycodone, pantoprazole, simvastatin, sucralfate, and benefiber.  Current Outpatient Prescriptions on File Prior to Visit  Medication Sig  . ANDROGEL PUMP 20.25 MG/ACT (1.62%) GEL APPLY TO 2 PUMPS ONCE A DAY AS DIRECTED  . aspirin 81 MG tablet Take 81 mg by mouth daily.  . citalopram (CELEXA) 10 MG tablet Take 10 mg by mouth daily.  . diphenhydramine-acetaminophen (TYLENOL PM EXTRA STRENGTH) 25-500 MG TABS tablet Take 1 tablet by mouth at bedtime as needed.  . pantoprazole (PROTONIX) 40 MG tablet Take 40 mg by mouth daily.  . simvastatin (ZOCOR)  10 MG tablet Take 10 mg by mouth at bedtime.   . sucralfate (CARAFATE) 1 g tablet Take by mouth.   No current facility-administered medications on file prior to visit.    ROS  Constitutional: Denies any fever or chills Gastrointestinal: No reported hemesis, hematochezia, vomiting, or acute GI distress Musculoskeletal: Denies any acute onset joint swelling, redness, loss of ROM, or weakness Neurological: No reported episodes of acute onset apraxia, aphasia, dysarthria, agnosia, amnesia, paralysis, loss of coordination, or loss of consciousness  Allergies  Mr. Southam has No Known Allergies.  [place]  Medical:  Mr. Litaker  has a past medical history of Bilateral arm pain; Cardiomyopathy ([place]); Diverticulosis; Dupuytren's contracture; Foot pain, bilateral; GERD (gastroesophageal reflux disease); Hand weakness; Hyperlipidemia; Knee pain; Midline low back pain; Osteoarthritis; Pain in neck; Prostate cancer ([place]); Reactive  airway disease; Seasonal allergies; Situational anxiety; and Sleep apnea. Family: family history includes Colon cancer in his brother; Hyperlipidemia in his sister; Lung cancer in his father; Prostate cancer in his brother. Surgical:  has a past surgical history that includes Nose surgery; Hand surgery (Bilateral); Colonoscopy (10/24/2012); Upper gi endoscopy (12/18/2012); Tonsillectomy (2009); Skin cancer excision (Left, 2015); Mass excision (Right, 07/18/2015); Esophagogastroduodenoscopy (egd) with propofol (N/A, 10/28/2015); prostate seeding; Hernia repair; and Cholecystectomy (N/A, 12/02/2015). Tobacco:  reports that he has never smoked. He has never used smokeless tobacco. Alcohol:  reports that he does not drink alcohol. Drug:  reports that he does not use drugs.  Constitutional Exam  General appearance: Well nourished, well developed, and well hydrated. In no acute distress Vitals:   06/07/16 1424  BP: 125/86  Pulse: 75  Resp: 16  Temp: 97.7 F (36.5 C)  TempSrc: Oral  SpO2: 99%  Weight: 185 lb (83.9 kg)  Height: 5\' 9"  (1.753 m)  BMI Assessment: Estimated body mass index is 27.32 kg/m as calculated from the following:   Height as of this encounter: 5\' 9"  (1.753 m).   Weight as of this encounter: 185 lb (83.9 kg).   BMI interpretation: (25-29.9 kg/m2) = Overweight: This range is associated with a 20% higher incidence of chronic pain. BMI Readings from Last 4 Encounters:  06/07/16 27.32 kg/m  05/04/16 28.06 kg/m  04/13/16 28.06 kg/m  03/18/16 27.32 kg/m   Wt Readings from Last 4 Encounters:  06/07/16 185 lb (83.9 kg)  05/04/16 190 lb (86.2 kg)  04/13/16 190 lb (86.2 kg)  03/18/16 185 lb (83.9 kg)  Psych/Mental status: Alert and oriented x 3 (person, place, & time) Eyes: PERLA Respiratory: No evidence of acute respiratory distress  Cervical Spine Exam  Inspection: No masses, redness, or swelling Alignment: Symmetrical Functional ROM: Unrestricted ROM Stability: No  instability detected Muscle strength & Tone: Functionally intact Sensory: Unimpaired Palpation: Non-contributory  Upper Extremity (UE) Exam    Side: Right upper extremity  Side: Left upper extremity  Inspection: No masses, redness, swelling, or asymmetry  Inspection: No masses, redness, swelling, or asymmetry  Functional ROM: Unrestricted ROM         Functional ROM: Unrestricted ROM          Muscle strength & Tone: Functionally intact  Muscle strength & Tone: Functionally intact  Sensory: Unimpaired  Sensory: Unimpaired  Palpation: Non-contributory  Palpation: Non-contributory   Thoracic Spine Exam  Inspection: No masses, redness, or swelling Alignment: Symmetrical Functional ROM: Unrestricted ROM Stability: No instability detected Sensory: Unimpaired Muscle strength & Tone: Functionally intact Palpation: Non-contributory  Lumbar Spine Exam  Inspection: No masses, redness, or swelling Alignment: Symmetrical Functional  ROM: Decreased ROM Stability: No instability detected Muscle strength & Tone: Functionally intact Sensory: Movement-associated pain Palpation: Complains of area being tender to palpation Provocative Tests: Lumbar Hyperextension and rotation test: Positive bilaterally for facet joint pain. Patrick's Maneuver: Positive for bilateral S-I joint pain           Gait & Posture Assessment  Ambulation: Unassisted Gait: Relatively normal for age and body habitus Posture: Antalgic   Lower Extremity Exam    Side: Right lower extremity  Side: Left lower extremity  Inspection: No masses, redness, swelling, or asymmetry  Inspection: No masses, redness, swelling, or asymmetry  Functional ROM: Unrestricted ROM          Functional ROM: Unrestricted ROM          Muscle strength & Tone: Functionally intact  Muscle strength & Tone: Functionally intact  Sensory: Unimpaired  Sensory: Unimpaired  Palpation: Non-contributory  Palpation: Non-contributory   Assessment  Primary  Diagnosis & Pertinent Problem List: The primary encounter diagnosis was Chronic pain. Diagnoses of Long term current use of opiate analgesic, Opiate use (45 MME/Day), Chronic low back pain  (Location of Primary Pain) (Bilateral) (L>R), Chronic hip pain, unspecified laterality, Chronic pain of lower extremity, unspecified laterality, Musculoskeletal pain, Neurogenic pain, Opiate use, Therapeutic opioid induced constipation, and Sleep apnea were also pertinent to this visit.  Visit Diagnosis: 1. Chronic pain   2. Long term current use of opiate analgesic   3. Opiate use (45 MME/Day)   4. Chronic low back pain  (Location of Primary Pain) (Bilateral) (L>R)   5. Chronic hip pain, unspecified laterality   6. Chronic pain of lower extremity, unspecified laterality   7. Musculoskeletal pain   8. Neurogenic pain   9. Opiate use   10. Therapeutic opioid induced constipation   11. Sleep apnea    Plan of Care  Pharmacotherapy (Medications Ordered): Meds ordered this encounter  Medications  . cyclobenzaprine (FLEXERIL) 10 MG tablet    Sig: Take 1 tablet (10 mg total) by mouth 3 (three) times daily as needed for muscle spasms.    Dispense:  90 tablet    Refill:  2    Do not place this medication, or any other prescription from our practice, on "Automatic Refill". Patient may have prescription filled one day early if pharmacy is closed on scheduled refill date.  Marland Kitchen DISCONTD: gabapentin (NEURONTIN) 100 MG capsule    Sig: Take 1-3 capsules (100-300 mg total) by mouth at bedtime. Follow the written titration schedule.    Dispense:  90 capsule    Refill:  2    Do not place this medication, or any other prescription from our practice, on "Automatic Refill". Patient may have prescription filled one day early if pharmacy is closed on scheduled refill date.  . bisacodyl (DULCOLAX) 5 MG EC tablet    Sig: Take 2 tablets (10 mg total) by mouth daily as needed for moderate constipation.    Dispense:  30 tablet     Refill:  2    Do not place this medication, or any other prescription from our practice, on "Automatic Refill". Patient may have prescription filled one day early if pharmacy is closed on scheduled refill date.  . docusate sodium (COLACE) 100 MG capsule    Sig: Take 1 capsule (100 mg total) by mouth at bedtime.    Dispense:  30 capsule    Refill:  2    Do not place this medication, or any other prescription from our  practice, on "Automatic Refill". Patient may have prescription filled one day early if pharmacy is closed on scheduled refill date.  . Wheat Dextrin (BENEFIBER) POWD    Sig: Stir 2 tsp. TID into 4-8 oz of any non-carbonated beverage or soft food (hot or cold)    Dispense:  500 g    Refill:  2    Do not place this medication, or any other prescription from our practice, on "Automatic Refill". Patient may have prescription filled one day early if pharmacy is closed on scheduled refill date.  Marland Kitchen oxyCODONE (OXY IR/ROXICODONE) 5 MG immediate release tablet    Sig: Take 1 tablet (5 mg total) by mouth every 4 (four) hours as needed for severe pain. Maximum: 6/day    Dispense:  180 tablet    Refill:  0    Do not add this medication to the electronic "Automatic Refill" notification system. Patient may have prescription filled one day early if pharmacy is closed on scheduled refill date. Do not fill until: 06/16/16 To last until: 07/16/16  . oxyCODONE (OXY IR/ROXICODONE) 5 MG immediate release tablet    Sig: Take 1 tablet (5 mg total) by mouth every 4 (four) hours as needed for severe pain. Maximum: 6/day    Dispense:  180 tablet    Refill:  0    Do not add this medication to the electronic "Automatic Refill" notification system. Patient may have prescription filled one day early if pharmacy is closed on scheduled refill date. Do not fill until: 07/16/16 To last until: 08/15/16  . oxyCODONE (OXY IR/ROXICODONE) 5 MG immediate release tablet    Sig: Take 1 tablet (5 mg total) by mouth  every 4 (four) hours as needed for severe pain. Maximum: 6/day    Dispense:  180 tablet    Refill:  0    Do not add this medication to the electronic "Automatic Refill" notification system. Patient may have prescription filled one day early if pharmacy is closed on scheduled refill date. Do not fill until:08/15/16 To last until: 09/14/16  . gabapentin (NEURONTIN) 300 MG capsule    Sig: Take 1-3 capsules (300-900 mg total) by mouth at bedtime.    Dispense:  90 capsule    Refill:  2    Do not place this medication, or any other prescription from our practice, on "Automatic Refill". Patient may have prescription filled one day early if pharmacy is closed on scheduled refill date.   New Prescriptions   GABAPENTIN (NEURONTIN) 300 MG CAPSULE    Take 1-3 capsules (300-900 mg total) by mouth at bedtime.   Medications administered during this visit: Mr. Hakola had no medications administered during this visit. Lab-work, Procedure(s), & Referral(s) Ordered: Orders Placed This Encounter  Procedures  . Ambulatory referral to Physical Therapy   Imaging & Referral(s) Ordered: AMB REFERRAL TO PHYSICAL THERAPY  Interventional Therapies: Scheduled:   Left sided lumbar facet + sacroiliac joint radiofrequency ablation under fluoroscopic guidance and IV sedation.    Considering: Left sided lumbar facet + sacroiliac joint radiofrequency ablation under fluoroscopic guidance and IV sedation.  Right-sided lumbar facet + sacroiliac joint radiofrequency ablation under fluoroscopic guidance and IV sedation. We will plan to do this too is to 6 weeks after the left side.  Diagnostic Bilateral Hip Block Possible Bilateral Hip RFA. Diagnostic bilateral cervical facet block. Possible bilateral cervical facet RFA. Diagnostic Caudal ESI. Diagnostic Left CESI. Diagnostic Right Knee injection. Diagnostic right Genicular NB Possible right Genicular Nerve RFA. Diagnostic bilateral intra-articular  shoulder  inj. Diagnostic bilateral suprascapular NB. Possible bilateral suprascapular Nerve RFA.   PRN Procedures: Diagnostic Bilateral Lumbar Facet & Bilateral S-I joint block under fluoroscopy and IV sedation. Diagnostic Bilateral Hip Block Diagnostic bilateral cervical facet block. Diagnostic Caudal ESI. Diagnostic Left CESI. Diagnostic Right Knee injection. Diagnostic right Genicular NB Diagnostic bilateral intra-articular shoulder inj. Diagnostic bilateral suprascapular NB.   Requested PM Follow-up: Return in 3 months (on 08/30/2016) for Med-Mgmt, In addition, Schedule Procedure.  Future Appointments Date Time Provider [place]  07/13/2016 10:40 AM Milinda Pointer, MD [facility] None   Primary Care Physician: [facility], Chrissie Noa, MD Location: [facility] Outpatient Pain Management Facility Note by: Kathlen Brunswick. Dossie Arbour, M.D, DABA, DABAPM, DABPM, DABIPP, FIPP  Pain Score Disclaimer: We use the NRS-11 scale. This is a self-reported, subjective measurement of pain severity with only modest accuracy. It is used primarily to identify changes within a particular patient. It must be understood that outpatient pain scales are significantly less accurate that those used for research, where they can be applied under ideal controlled circumstances with minimal exposure to variables. In reality, the score is likely to be a combination of pain intensity and pain affect, where pain affect describes the degree of emotional arousal or changes in action readiness caused by the sensory experience of pain. Factors such as social and work situation, setting, emotional state, anxiety levels, expectation, and prior pain experience may influence pain perception and show large inter-individual differences that may also be affected by time variables.  Patient instructions provided during this appointment: Patient Instructions  You were given 3 prescriptions for Oxycodone today. Radiofrequency  Lesioning Radiofrequency lesioning is a procedure that is performed to relieve pain. The procedure is often used for back, neck, or arm pain. Radiofrequency lesioning involves the use of a machine that creates radio waves to make heat. During the procedure, the heat is applied to the nerve that carries the pain signal. The heat damages the nerve and interferes with the pain signal. Pain relief usually lasts for 6 months to 1 year. LET [facility] CARE PROVIDER KNOW ABOUT:  Any allergies you have.  All medicines you are taking, including vitamins, herbs, eye drops, creams, and over-the-counter medicines.  Previous problems you or members of your family have had with the use of anesthetics.  Any blood disorders you have.  Previous surgeries you have had.  Any medical conditions you have.  Whether you are pregnant or may be pregnant. RISKS AND COMPLICATIONS Generally, this is a safe procedure. However, problems may occur, including:  Pain or soreness at the injection site.  Infection at the injection site.  Damage to nerves or blood vessels. BEFORE THE PROCEDURE  Ask your health care provider about:  Changing or stopping your regular medicines. This is especially important if you are taking diabetes medicines or blood thinners.  Taking medicines such as aspirin and ibuprofen. These medicines can thin your blood. Do not take these medicines before your procedure if your health care provider instructs you not to.  Follow instructions from your health care provider about eating or drinking restrictions.  Plan to have someone take you home after the procedure.  If you go home right after the procedure, plan to have someone with you for 24 hours. PROCEDURE  You will be given one or more of the following:  A medicine to help you relax (sedative).  A medicine to numb the area (local anesthetic).  You will be awake during the procedure. You will need to  be able to talk with the  health care provider during the procedure.  With the help of a type of X-ray (fluoroscopy), the health care provider will insert a radiofrequency needle into the area to be treated.  Next, a wire that carries the radio waves (electrode) will be put through the radiofrequency needle. An electrical pulse will be sent through the electrode to verify the correct nerve. You will feel a tingling sensation, and you may have muscle twitching.  Then, the tissue that is around the needle tip will be heated by an electric current that is passed using the radiofrequency machine. This will numb the nerves.  A bandage (dressing) will be put on the insertion area after the procedure is done. The procedure may vary among health care providers and hospitals. AFTER THE PROCEDURE  Your blood pressure, heart rate, breathing rate, and blood oxygen level will be monitored often until the medicines you were given have worn off.  Return to your normal activities as directed by your health care provider.   This information is not intended to replace advice given to you by your health care provider. Make sure you discuss any questions you have with your health care provider.   Document Released: 04/28/2011 Document Revised: 05/21/2015 Document Reviewed: 10/07/2014 Elsevier Interactive Patient Education [employer].

## 2016-06-07 NOTE — Patient Instructions (Signed)
You were given 3 prescriptions for Oxycodone today. Radiofrequency Lesioning Radiofrequency lesioning is a procedure that is performed to relieve pain. The procedure is often used for back, neck, or arm pain. Radiofrequency lesioning involves the use of a machine that creates radio waves to make heat. During the procedure, the heat is applied to the nerve that carries the pain signal. The heat damages the nerve and interferes with the pain signal. Pain relief usually lasts for 6 months to 1 year. LET [facility] CARE PROVIDER KNOW ABOUT:  Any allergies you have.  All medicines you are taking, including vitamins, herbs, eye drops, creams, and over-the-counter medicines.  Previous problems you or members of your family have had with the use of anesthetics.  Any blood disorders you have.  Previous surgeries you have had.  Any medical conditions you have.  Whether you are pregnant or may be pregnant. RISKS AND COMPLICATIONS Generally, this is a safe procedure. However, problems may occur, including:  Pain or soreness at the injection site.  Infection at the injection site.  Damage to nerves or blood vessels. BEFORE THE PROCEDURE  Ask your health care provider about:  Changing or stopping your regular medicines. This is especially important if you are taking diabetes medicines or blood thinners.  Taking medicines such as aspirin and ibuprofen. These medicines can thin your blood. Do not take these medicines before your procedure if your health care provider instructs you not to.  Follow instructions from your health care provider about eating or drinking restrictions.  Plan to have someone take you home after the procedure.  If you go home right after the procedure, plan to have someone with you for 24 hours. PROCEDURE  You will be given one or more of the following:  A medicine to help you relax (sedative).  A medicine to numb the area (local anesthetic).  You will be awake  during the procedure. You will need to be able to talk with the health care provider during the procedure.  With the help of a type of X-ray (fluoroscopy), the health care provider will insert a radiofrequency needle into the area to be treated.  Next, a wire that carries the radio waves (electrode) will be put through the radiofrequency needle. An electrical pulse will be sent through the electrode to verify the correct nerve. You will feel a tingling sensation, and you may have muscle twitching.  Then, the tissue that is around the needle tip will be heated by an electric current that is passed using the radiofrequency machine. This will numb the nerves.  A bandage (dressing) will be put on the insertion area after the procedure is done. The procedure may vary among health care providers and hospitals. AFTER THE PROCEDURE  Your blood pressure, heart rate, breathing rate, and blood oxygen level will be monitored often until the medicines you were given have worn off.  Return to your normal activities as directed by your health care provider.   This information is not intended to replace advice given to you by your health care provider. Make sure you discuss any questions you have with your health care provider.   Document Released: 04/28/2011 Document Revised: 05/21/2015 Document Reviewed: 10/07/2014 Elsevier Interactive Patient Education [employer].

## 2016-06-07 NOTE — Progress Notes (Signed)
Safety precautions to be maintained throughout the outpatient stay will include: orient to surroundings, keep bed in low position, maintain call bell within reach at all times, provide assistance with transfer out of bed and ambulation.   Oxycodone 5 mg 66 out of 180 remaining.Filled 04-26-16.

## 2016-07-13 ENCOUNTER — Ambulatory Visit: Payer: BLUE CROSS/BLUE SHIELD | Admitting: Pain Medicine

## 2016-08-30 ENCOUNTER — Encounter: Payer: Self-pay | Admitting: Pain Medicine

## 2016-08-30 ENCOUNTER — Ambulatory Visit: Payer: BLUE CROSS/BLUE SHIELD | Attending: Pain Medicine | Admitting: Pain Medicine

## 2016-08-30 VITALS — BP 134/79 | HR 80 | Temp 98.2°F | Resp 16 | Ht 69.0 in | Wt 185.0 lb

## 2016-08-30 DIAGNOSIS — M5416 Radiculopathy, lumbar region: Secondary | ICD-10-CM

## 2016-08-30 DIAGNOSIS — M791 Myalgia: Secondary | ICD-10-CM | POA: Diagnosis not present

## 2016-08-30 DIAGNOSIS — M25512 Pain in left shoulder: Secondary | ICD-10-CM | POA: Insufficient documentation

## 2016-08-30 DIAGNOSIS — M25561 Pain in right knee: Secondary | ICD-10-CM | POA: Insufficient documentation

## 2016-08-30 DIAGNOSIS — K5790 Diverticulosis of intestine, part unspecified, without perforation or abscess without bleeding: Secondary | ICD-10-CM | POA: Insufficient documentation

## 2016-08-30 DIAGNOSIS — M533 Sacrococcygeal disorders, not elsewhere classified: Secondary | ICD-10-CM | POA: Insufficient documentation

## 2016-08-30 DIAGNOSIS — M79604 Pain in right leg: Secondary | ICD-10-CM | POA: Diagnosis not present

## 2016-08-30 DIAGNOSIS — M5441 Lumbago with sciatica, right side: Secondary | ICD-10-CM

## 2016-08-30 DIAGNOSIS — M25559 Pain in unspecified hip: Secondary | ICD-10-CM | POA: Diagnosis not present

## 2016-08-30 DIAGNOSIS — M545 Low back pain: Secondary | ICD-10-CM | POA: Diagnosis not present

## 2016-08-30 DIAGNOSIS — M720 Palmar fascial fibromatosis [Dupuytren]: Secondary | ICD-10-CM | POA: Insufficient documentation

## 2016-08-30 DIAGNOSIS — M4722 Other spondylosis with radiculopathy, cervical region: Secondary | ICD-10-CM | POA: Insufficient documentation

## 2016-08-30 DIAGNOSIS — G894 Chronic pain syndrome: Secondary | ICD-10-CM | POA: Diagnosis not present

## 2016-08-30 DIAGNOSIS — R079 Chest pain, unspecified: Secondary | ICD-10-CM | POA: Diagnosis not present

## 2016-08-30 DIAGNOSIS — M4802 Spinal stenosis, cervical region: Secondary | ICD-10-CM | POA: Insufficient documentation

## 2016-08-30 DIAGNOSIS — R531 Weakness: Secondary | ICD-10-CM | POA: Diagnosis not present

## 2016-08-30 DIAGNOSIS — Z79891 Long term (current) use of opiate analgesic: Secondary | ICD-10-CM | POA: Insufficient documentation

## 2016-08-30 DIAGNOSIS — Z9889 Other specified postprocedural states: Secondary | ICD-10-CM | POA: Diagnosis not present

## 2016-08-30 DIAGNOSIS — M7918 Myalgia, other site: Secondary | ICD-10-CM

## 2016-08-30 DIAGNOSIS — M79605 Pain in left leg: Secondary | ICD-10-CM | POA: Insufficient documentation

## 2016-08-30 DIAGNOSIS — Z8546 Personal history of malignant neoplasm of prostate: Secondary | ICD-10-CM | POA: Diagnosis not present

## 2016-08-30 DIAGNOSIS — M25551 Pain in right hip: Secondary | ICD-10-CM | POA: Diagnosis not present

## 2016-08-30 DIAGNOSIS — E7800 Pure hypercholesterolemia, unspecified: Secondary | ICD-10-CM | POA: Diagnosis not present

## 2016-08-30 DIAGNOSIS — M25552 Pain in left hip: Secondary | ICD-10-CM | POA: Diagnosis not present

## 2016-08-30 DIAGNOSIS — T402X5A Adverse effect of other opioids, initial encounter: Secondary | ICD-10-CM

## 2016-08-30 DIAGNOSIS — Z9049 Acquired absence of other specified parts of digestive tract: Secondary | ICD-10-CM | POA: Insufficient documentation

## 2016-08-30 DIAGNOSIS — Z7982 Long term (current) use of aspirin: Secondary | ICD-10-CM | POA: Insufficient documentation

## 2016-08-30 DIAGNOSIS — G629 Polyneuropathy, unspecified: Secondary | ICD-10-CM | POA: Diagnosis not present

## 2016-08-30 DIAGNOSIS — M5442 Lumbago with sciatica, left side: Secondary | ICD-10-CM | POA: Diagnosis not present

## 2016-08-30 DIAGNOSIS — F329 Major depressive disorder, single episode, unspecified: Secondary | ICD-10-CM | POA: Insufficient documentation

## 2016-08-30 DIAGNOSIS — M9981 Other biomechanical lesions of cervical region: Secondary | ICD-10-CM

## 2016-08-30 DIAGNOSIS — I429 Cardiomyopathy, unspecified: Secondary | ICD-10-CM | POA: Diagnosis not present

## 2016-08-30 DIAGNOSIS — K5903 Drug induced constipation: Secondary | ICD-10-CM | POA: Diagnosis not present

## 2016-08-30 DIAGNOSIS — G8929 Other chronic pain: Secondary | ICD-10-CM

## 2016-08-30 DIAGNOSIS — J45909 Unspecified asthma, uncomplicated: Secondary | ICD-10-CM | POA: Insufficient documentation

## 2016-08-30 DIAGNOSIS — G4730 Sleep apnea, unspecified: Secondary | ICD-10-CM | POA: Insufficient documentation

## 2016-08-30 DIAGNOSIS — J302 Other seasonal allergic rhinitis: Secondary | ICD-10-CM | POA: Insufficient documentation

## 2016-08-30 DIAGNOSIS — K219 Gastro-esophageal reflux disease without esophagitis: Secondary | ICD-10-CM | POA: Insufficient documentation

## 2016-08-30 DIAGNOSIS — M549 Dorsalgia, unspecified: Secondary | ICD-10-CM | POA: Diagnosis present

## 2016-08-30 DIAGNOSIS — M25511 Pain in right shoulder: Secondary | ICD-10-CM | POA: Insufficient documentation

## 2016-08-30 DIAGNOSIS — E785 Hyperlipidemia, unspecified: Secondary | ICD-10-CM | POA: Insufficient documentation

## 2016-08-30 DIAGNOSIS — F1190 Opioid use, unspecified, uncomplicated: Secondary | ICD-10-CM

## 2016-08-30 DIAGNOSIS — Z6827 Body mass index (BMI) 27.0-27.9, adult: Secondary | ICD-10-CM | POA: Insufficient documentation

## 2016-08-30 MED ORDER — OXYCODONE HCL 5 MG PO TABS: 5.0000 mg | ORAL_TABLET | Freq: Three times a day (TID) | ORAL | 0 refills | Status: DC | PRN

## 2016-08-30 MED ORDER — OXYCODONE HCL 5 MG PO TABS: 5.0000 mg | ORAL_TABLET | ORAL | 0 refills | Status: DC | PRN

## 2016-08-30 NOTE — Progress Notes (Signed)
Patient's Name: CLAY SOLUM  MRN: [id number]  Referring Provider: Sofie Hartigan, MD  DOB: 07-18-1951  PCP: Sofie Hartigan, MD  DOS: 08/30/2016  Note by: Kathlen Brunswick. Dossie Arbour, MD  Service setting: Ambulatory outpatient  Specialty: Interventional Pain Management  Location: [facility] (AMB) Pain Management Facility    Patient type: Established   Primary Reason(s) for Visit: Encounter for prescription drug management (Level of risk: moderate) CC: Shoulder Pain (right) and Back Pain (entire)  HPI  Mr. Apo is a 65 y.o. year old, male patient, who comes today for a medication management evaluation. He has Hand weakness; Polyradiculopathy; Long term current use of opiate analgesic; Long term prescription opiate use; Opiate use (45 MME/Day); Opiate dependence ([place]); Encounter for therapeutic drug level monitoring; Chronic low back pain  (Location of Primary Pain) (Bilateral) (L>R); Chronic lower extremity pain (Location of Tertiary source of pain) (Bilateral) (L>R); Chronic lumbar radicular pain (Bilateral) (L>R) (Bilateral S1 dermatomal distribution); Chronic hip pain (Location of Secondary source of pain) (Bilateral) (L>R); Chronic upper back pain (Bilateral) (L>R); Chronic neck pain (neck<shoulder) (L>R); Chronic foot pain; Chronic knee pain (Right); Cervical spondylosis; 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); Chronic upper extremity pain (Bilateral) (L>R); Lumbar spondylosis; Cervical facet hypertrophy; Cervical facet syndrome (R>L); History of prostate cancer; Radicular pain of shoulder; Class I Morbid obesity (HCC) (68% higher incidence of chronic low back problems); Peripheral neuropathy (HCC) (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); Lumbar facet syndrome (Bilateral) (L>R); Sleep apnea; Depression; High cholesterol; Low testosterone; History of surgical procedure; Opioid-induced constipation (OIC); Chronic shoulder pain (Bilateral) (L>R); Breathing orally; Chest  pain; Primary cardiomyopathy ([place]); Chronic sacroiliac joint pain (Bilateral) (L>R); Musculoskeletal pain; Pain of right foot; Neurogenic pain; Chest pain with high risk for cardiac etiology; and Chronic pain syndrome on his problem list. His primarily concern today is the Shoulder Pain (right) and Back Pain (entire)  Pain Assessment: Self-Reported Pain Score: 4 /10 Clinically the patient looks like a 2/10 Reported level is inconsistent with clinical observations. Information on the proper use of the pain score provided to the patient today. Pain Type: Chronic pain Pain Location: Shoulder (patient states that he has generalized aches and pains all over his body and is unsure of what is causing the pain.  ) Pain Orientation: Right Pain Descriptors / Indicators: Discomfort, Other (Comment), Tingling (shocking sensation through the back) Pain Frequency: Constant  Mr. Anspach was last seen on 06/07/2016 for medication management. During today's appointment we reviewed [name] chronic pain status, as well as his outpatient medication regimen. Today's appointment it took a while to sorted out because the [place] did not match the bottle that he brought in today. As it turns out, he brought in an old bottle and left at home the one that he had just had refilled. It turns out that he is not taking the medication as we had prescribed and he is only taking 1 tablet 3 times a day therefore he has a significant amount of pills left. After doing the correct count, it seems that he will not need a refill until 11/04/2016. Day I have given him a prescription for a refill at that time, but this was primarily done so as to keep track of the change in the medications. It is very possible that that prescription may expire before he can get it filled. In any case, today we took the opportunity to remind him that he should always bring all of his  pills and not just some  of them. In addition, I have reminded him that he needs to stick to using only one pharmacy. Even though he was using the [facility], he was using 3 different pharmacies. The pharmacy that he used before getting his last prescription filled did not correctly write down the date that my prescription was filled and instead they entered the date that they filled it. This also created some confusions which had to be sorted out before giving him a refill. He states that when he takes the oxycodone he feels that he has no energy. Because of this is not using it as much as he could have. However, he also complains that is not as effective as as it used to be, but this is because I had changed his prescription from the 10 mg to the 5 mg where he was still getting 30 mg per day but in divided doses since he had indicated in the past that every time that he took a pill he would feel sleepy, but they would not last the 8 hours like they were supposed to. Because of this I switched him to the 5 mg and allowed him to take it every 4 hours. However, he indicates that his father apparently died in his sleep and he is afraid that something similar is going to happen to him because of his sleep apnea. I took the opportunity to ask him if he was using the CPAP but he indicated that he is not. He also indicates that he wants to get this prescribed to him and adjusted and I pointed out that this was something that was within the realm of his PCP and outside of my field of expertise. However I told him that I would be writing a letter to his PCP to recruit his help with this matter. Today we will readjust his prescriptions to reflect what he is actually taking. In addition, we will factor in the surplus of pills that he has left. According to our calculations, he should have enough medication to last until 11/04/2016.  At this point, we have done to diagnostic lumbar facet and sacroiliac joint injections that have confirmed that a  significant amount of his low back pain is coming from the facet joints and the sacroiliac joint. However, we've requested preapproval for a radiofrequency, blue) she'll indicated that he needed to have a recent trial of physical therapy. We did order that, but the patient did not keep the appointment since the last physical therapy that he had done in [place] rather than helping him, worsened his pain, as would be expected from facet syndrome. Because he knew that it was not going to help him and in fact it was given a make things worse, he decided not to keep the physical therapy appointment. Now he realizes that he is not going to get the radiofrequency approved unless he does the trial. He has now requested that we give him another referral so that he can give it another try. Once he completes this requirement of [facility], then we will again request preapproval for the radiofrequency ablation.  Meanwhile, he indicates that he is having some problems with memory and judging distances. I reminded him that he is not supposed to be driving while taking any of these medications. He admitted that he had signed his medication agreement but had not read it. Our goal will be to see if we can provide him with good relief  of the pain with the radiofrequency ablation so that we can then tapered down and stop the opioids. He is in agreement with this plan.  In addition to his low back problems, he is indicating experiencing some additional neck pain, headaches, and shooting pains down the arms. Unfortunately, he indicates that he was unable to tolerate the gabapentin and he has stopped it. Review of his cervical pathology reveals severe cervical facet hypertrophy with associated foraminal stenosis at the C2-3 and C3-4 levels. Once we are done with the lumbar spine, we will move on to the cervical region to assist him with this. Meanwhile, we have decided to bring him back for a cervical epidural steroid  injection to help with this discomfort.  The patient  reports that he does not use drugs. His body mass index is 27.32 kg/m.  Further details on both, my assessment(s), as well as the proposed treatment plan, please see below.  Controlled Substance Pharmacotherapy Assessment REMS (Risk Evaluation and Mitigation Strategy)  Analgesic:Oxycodone IR 5 mg every 8 hours (15 mg/day) MME/day:22.5 mg/day. Patterson,Delores G, RN  08/30/2016  1:41 PM  Sign at close encounter Nursing Pain Medication Assessment:  Safety precautions to be maintained throughout the outpatient stay will include: orient to surroundings, keep bed in low position, maintain call bell within reach at all times, provide assistance with transfer out of bed and ambulation.  Medication Inspection Compliance: Pill count conducted under aseptic conditions, in front of the patient. Neither the pills nor the bottle was removed from the patient's sight at any time. Once count was completed pills were immediately returned to the patient in their original bottle.  Medication: See above Pill Count: 20 of 180 pills remain. In addition to this, he has a bottle at home that was just refilled and has 180 of 180 pills. The total amount of pills that he has is 200. Based on his statement that he takes 1 tablet 3 times a day, then he should have enough to last for another 66 days, which would put as at 11/04/2016 for his next scheduled refill. Bottle Appearance: Standard pharmacy container. Clearly labeled. Filled Date: 68 / 13 / 2017 Medication last intake: 08/30/16 @ 1130 Pharmacokinetics: Liberation and absorption (onset of action): WNL Distribution (time to peak effect): WNL Metabolism and excretion (duration of action): WNL         Pharmacodynamics: Desired effects: Analgesia: Mr. Gunawan reports >50% benefit. Functional ability: Patient reports that medication allows him to accomplish basic ADLs Clinically meaningful improvement in  function (CMIF): Sustained CMIF goals met Perceived effectiveness: Described as relatively effective, allowing for increase in activities of daily living (ADL) Undesirable effects: Side-effects or Adverse reactions: None reported Monitoring: [place] PMP: Online review of the past 17-monthperiod conducted. Compliant with practice rules and regulations List of all UDS test(s) done:  Lab Results  Component Value Date   TOXASSSELUR FINAL 12/23/2015   TOXASSSELUR FINAL 11/17/2015   TOXASSSELUR FINAL 07/23/2015   Last UDS on record: ToxAssure Select 13  Date Value Ref Range Status  12/23/2015 FINAL  Final    Comment:    ==================================================================== TOXASSURE SELECT 13 (MW) ==================================================================== Test                             Result       Flag       Units Drug Present and Declared for Prescription Verification   Oxycodone  77           EXPECTED   ng/mg creat   Oxymorphone                    886          EXPECTED   ng/mg creat   Noroxycodone                   404          EXPECTED   ng/mg creat   Noroxymorphone                 329          EXPECTED   ng/mg creat    Sources of oxycodone are scheduled prescription medications.    Oxymorphone, noroxycodone, and noroxymorphone are expected    metabolites of oxycodone. Oxymorphone is also available as a    scheduled prescription medication. ==================================================================== Test                      Result    Flag   Units      Ref Range   Creatinine              160              mg/dL      >=20 ==================================================================== Declared Medications:  The flagging and interpretation on this report are based on the  following declared medications.  Unexpected results may arise from  inaccuracies in the declared medications.  **Note: The testing scope of this panel  includes these medications:  Oxycodone  **Note: The testing scope of this panel does not include following  reported medications:  Acetaminophen  Aspirin  Bisacodyl  Citalopram  Diphenhydramine  Docusate  Pantoprazole  Simvastatin  Supplement  Testosterone ==================================================================== For clinical consultation, please call [phone]. ====================================================================    UDS interpretation: Compliant          Medication Assessment Form: Reviewed. Patient indicates being compliant with therapy Treatment compliance: Compliant Risk Assessment Profile: Aberrant behavior: See prior evaluations. None observed or detected today Comorbid factors increasing risk of overdose: See prior notes. No additional risks detected today Risk of substance use disorder (SUD): Low-to-Moderate Opioid Risk Tool (ORT) Total Score: 3  Interpretation Table:  Score <3 = Low Risk for SUD  Score between 4-7 = Moderate Risk for SUD  Score >8 = High Risk for Opioid Abuse   Risk Mitigation Strategies:  Patient Counseling: Covered Patient-Prescriber Agreement (PPA): Present and active  Notification to other healthcare providers: Done  Pharmacologic Plan: No change in therapy, at this time  Laboratory Chemistry  Inflammation Markers Lab Results  Component Value Date   ESRSEDRATE 4 08/21/2015   CRP <0.5 08/21/2015   Renal Function Lab Results  Component Value Date   BUN 13 11/25/2015   CREATININE 0.96 11/25/2015   GFRAA >60 11/25/2015   GFRNONAA >60 11/25/2015   Hepatic Function Lab Results  Component Value Date   AST 31 11/25/2015   ALT 41 11/25/2015   ALBUMIN 3.9 11/25/2015   Electrolytes Lab Results  Component Value Date   NA 138 11/25/2015   K 4.2 11/25/2015   CL 104 11/25/2015   CALCIUM 9.0 11/25/2015   MG 2.1 08/21/2015   Pain Modulating Vitamins No results found for: [place], [id number],  [id number], [id number], 25OHVITD1, 25OHVITD2, 25OHVITD3, VITAMINB12 Coagulation Parameters Lab Results  Component Value Date   PLT 238 11/25/2015   Cardiovascular Lab  Results  Component Value Date   HGB 14.6 11/25/2015   HCT 43.5 11/25/2015   Note: Lab results reviewed.  Recent Diagnostic Imaging Review  No results found. Note: Imaging results reviewed.          Meds  The patient has a current medication list which includes the following prescription(s): androgel pump, aspirin, bisacodyl, citalopram, cyclobenzaprine, meloxicam, oxycodone, pantoprazole, simvastatin, benefiber, and oxycodone.  Current Outpatient Prescriptions on File Prior to Visit  Medication Sig  . ANDROGEL PUMP 20.25 MG/ACT (1.62%) GEL APPLY TO 2 PUMPS ONCE A DAY AS DIRECTED  . aspirin 81 MG tablet Take 81 mg by mouth daily.  . bisacodyl (DULCOLAX) 5 MG EC tablet Take 2 tablets (10 mg total) by mouth daily as needed for moderate constipation.  . citalopram (CELEXA) 10 MG tablet Take 10 mg by mouth daily.  . cyclobenzaprine (FLEXERIL) 10 MG tablet Take 1 tablet (10 mg total) by mouth 3 (three) times daily as needed for muscle spasms.  . pantoprazole (PROTONIX) 40 MG tablet Take 40 mg by mouth daily.  . simvastatin (ZOCOR) 10 MG tablet Take 10 mg by mouth at bedtime.   . Wheat Dextrin (BENEFIBER) POWD Stir 2 tsp. TID into 4-8 oz of any non-carbonated beverage or soft food (hot or cold)   No current facility-administered medications on file prior to visit.    ROS  Constitutional: Denies any fever or chills Gastrointestinal: No reported hemesis, hematochezia, vomiting, or acute GI distress Musculoskeletal: Denies any acute onset joint swelling, redness, loss of ROM, or weakness Neurological: No reported episodes of acute onset apraxia, aphasia, dysarthria, agnosia, amnesia, paralysis, loss of coordination, or loss of consciousness  Allergies  Mr. Yepiz has No Known Allergies.  PFSH  Drug: Mr. Carreira   reports that he does not use drugs. Alcohol:  reports that he does not drink alcohol. Tobacco:  reports that he has never smoked. He has never used smokeless tobacco. Medical:  has a past medical history of Bilateral arm pain; Cardiomyopathy ([place]); Diverticulosis; Dupuytren's contracture; Foot pain, bilateral; GERD (gastroesophageal reflux disease); Hand weakness; Hyperlipidemia; Knee pain; Midline low back pain; Osteoarthritis; Pain in neck; Prostate cancer ([place]); Reactive airway disease; Seasonal allergies; Situational anxiety; and Sleep apnea. Family: family history includes Colon cancer in his brother; Hyperlipidemia in his sister; Lung cancer in his father; Prostate cancer in his brother.  Past Surgical History:  Procedure Laterality Date  . CHOLECYSTECTOMY N/A 12/02/2015   Procedure: LAPAROSCOPIC CHOLECYSTECTOMY;  Surgeon: Leonie Green, MD;  Location: [facility] ORS;  Service: General;  Laterality: N/A;  . COLONOSCOPY  10/24/2012  . ESOPHAGOGASTRODUODENOSCOPY (EGD) WITH PROPOFOL N/A 10/28/2015   Procedure: ESOPHAGOGASTRODUODENOSCOPY (EGD) WITH PROPOFOL;  Surgeon: Lollie Sails, MD;  Location: [facility];  Service: Endoscopy;  Laterality: N/A;  . HAND SURGERY Bilateral   . HERNIA REPAIR    . MASS EXCISION Right 07/18/2015   Procedure: EXCISION MASS RIGHT HAND;  Surgeon: Leanor Kail, MD;  Location: [place];  Service: Orthopedics;  Laterality: Right;  CPAP  . NOSE SURGERY    . prostate seeding    . SKIN CANCER EXCISION Left 2015   shoulder  . TONSILLECTOMY  2009   Surgery for OSA (screw in place)  . UPPER GI ENDOSCOPY  12/18/2012   x3   Constitutional Exam  General appearance: Well nourished, well developed, and well hydrated. In no apparent acute distress Vitals:   08/30/16 1327  BP: 134/79  Pulse: 80  Resp: 16  Temp: 98.2 F (  36.8 C)  TempSrc: Oral  SpO2: (!) 81%  Weight: 185 lb (83.9 kg)  Height: 5' 9"  (1.753 m)   BMI Assessment: Estimated body mass  index is 27.32 kg/m as calculated from the following:   Height as of this encounter: 5' 9"  (1.753 m).   Weight as of this encounter: 185 lb (83.9 kg).  BMI interpretation table: BMI level Category Range association with higher incidence of chronic pain  <18 kg/m2 Underweight   18.5-24.9 kg/m2 Ideal body weight   25-29.9 kg/m2 Overweight Increased incidence by 20%  30-34.9 kg/m2 Obese (Class I) Increased incidence by 68%  35-39.9 kg/m2 Severe obesity (Class II) Increased incidence by 136%  >40 kg/m2 Extreme obesity (Class III) Increased incidence by 254%   BMI Readings from Last 4 Encounters:  08/30/16 27.32 kg/m  06/07/16 27.32 kg/m  05/04/16 28.06 kg/m  04/13/16 28.06 kg/m   Wt Readings from Last 4 Encounters:  08/30/16 185 lb (83.9 kg)  06/07/16 185 lb (83.9 kg)  05/04/16 190 lb (86.2 kg)  04/13/16 190 lb (86.2 kg)  Psych/Mental status: Alert, oriented x 3 (person, place, & time) Eyes: PERLA Respiratory: No evidence of acute respiratory distress  Cervical Spine Exam  Inspection: No masses, redness, or swelling Alignment: Symmetrical Functional ROM: Decreased ROM Stability: No instability detected Muscle strength & Tone: Functionally intact Sensory: Movement-associated pain Palpation: Complains of area being tender to palpation  Upper Extremity (UE) Exam    Side: Right upper extremity  Side: Left upper extremity  Inspection: No masses, redness, swelling, or asymmetry  Inspection: No masses, redness, swelling, or asymmetry  Functional ROM: Unrestricted ROM          Functional ROM: Unrestricted ROM          Muscle strength & Tone: Functionally intact  Muscle strength & Tone: Functionally intact  Sensory: Unimpaired  Sensory: Unimpaired  Palpation: Non-contributory  Palpation: Non-contributory   Thoracic Spine Exam  Inspection: No masses, redness, or swelling Alignment: Symmetrical Functional ROM: Unrestricted ROM Stability: No instability detected Sensory:  Unimpaired Muscle strength & Tone: Functionally intact Palpation: Non-contributory  Lumbar Spine Exam  Inspection: No masses, redness, or swelling Alignment: Symmetrical Functional ROM: Decreased ROM Stability: No instability detected Muscle strength & Tone: Functionally intact Sensory: Movement-associated pain Palpation: Complains of area being tender to palpation Provocative Tests: Lumbar Hyperextension and rotation test: Positive bilaterally for facet joint pain. Patrick's Maneuver: Positive for bilateral S-I joint pain           Gait & Posture Assessment  Ambulation: Unassisted Gait: Relatively normal for age and body habitus Posture: WNL   Lower Extremity Exam    Side: Right lower extremity  Side: Left lower extremity  Inspection: No masses, redness, swelling, or asymmetry  Inspection: No masses, redness, swelling, or asymmetry  Functional ROM: Unrestricted ROM          Functional ROM: Unrestricted ROM          Muscle strength & Tone: Functionally intact  Muscle strength & Tone: Functionally intact  Sensory: Unimpaired  Sensory: Unimpaired  Palpation: Non-contributory  Palpation: Non-contributory   Assessment  Primary Diagnosis & Pertinent Problem List: The primary encounter diagnosis was Chronic pain syndrome. 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  Status Diagnosis   Stable  Stable  Stable 1. Chronic pain syndrome   2. Chronic low back pain  (Location of Primary Pain) (Bilateral) (L>R)   3. Chronic hip pain (Location of Secondary source of pain) (Bilateral) (L>R)   4. Chronic lower extremity pain (Location of Tertiary source of pain) (Bilateral) (L>R)   5. Chronic lumbar radicular pain (Bilateral) (L>R) (Bilateral S1 dermatomal distribution)   6. Long term current use of opiate analgesic   7. Long term prescription opiate use   8. Opiate use (45 MME/Day)   9. Opioid-induced constipation (OIC)   10. Musculoskeletal pain   11. Opiate use   12. Therapeutic opioid induced constipation   13. 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 Osteoarthritis of spine with radiculopathy, cervical region      Plan of Care  Pharmacotherapy (Medications Ordered): Meds ordered this encounter  Medications  . oxyCODONE (OXY IR/ROXICODONE) 5 MG immediate release tablet    Sig: Take 1 tablet (5 mg total) by mouth every 8 (eight) hours as needed for severe pain.    Dispense:  90 tablet    Refill:  0    Do not add this medication to the electronic "Automatic Refill" notification system. Patient may have prescription filled one day early if pharmacy is closed on scheduled refill date. Do not fill until: 12/04/16 To last until: 01/03/17  . oxyCODONE (OXY IR/ROXICODONE) 5 MG immediate release tablet    Sig: Take 1 tablet (5 mg total) by mouth every 8 (eight) hours as needed for severe pain.    Dispense:  90 tablet    Refill:  0    Do not add this medication to the electronic "Automatic Refill" notification system. Patient may have prescription filled one day early if pharmacy is closed on scheduled refill date. Do not fill until: 11/04/16 To last until: 12/04/16   New Prescriptions   No medications on file   Medications administered today: Mr. Sundt had no medications administered during this visit. Lab-work,  procedure(s), and/or referral(s): Orders Placed This Encounter  Procedures  . Cervical Epidural Injection  . ToxASSURE Select 13 (MW), Urine  . Ambulatory referral to Physical Therapy   Imaging and/or referral(s): AMB REFERRAL TO PHYSICAL THERAPY  Interventional therapies: Planned, scheduled, and/or pending:   Diagnostic left cervical epidural steroid injection under fluoroscopic guidance and IV sedation.    Considering:   Left sided lumbar facet + sacroiliac joint radiofrequency ablation under fluoroscopic guidance and IV sedation.  Right-sided lumbar facet + sacroiliac joint radiofrequency ablation under fluoroscopic guidance and IV sedation. We will plan to do this too is to 6 weeks after the left side.  Diagnostic Bilateral Hip Block Possible Bilateral Hip RFA. Diagnostic bilateral cervical facet block. Possible bilateral cervical facet RFA. Diagnostic Caudal ESI. Diagnostic Left CESI. Diagnostic Right Knee injection. Diagnostic right Genicular NB Possible right Genicular Nerve RFA. Diagnostic bilateral intra-articular shoulder inj. Diagnostic bilateral suprascapular NB. Possible bilateral suprascapular Nerve RFA.   Palliative PRN treatment(s):   Diagnostic Bilateral Lumbar Facet & Bilateral S-I joint block under fluoroscopy and IV sedation. Diagnostic Bilateral Hip Block Diagnostic bilateral cervical facet block. Diagnostic Caudal ESI. Diagnostic Left CESI. Diagnostic Right Knee injection. Diagnostic right Genicular NB Diagnostic  bilateral intra-articular shoulder inj. Diagnostic bilateral suprascapular NB.   Provider-requested follow-up: Return in 4 months (on 12/21/2016) for Med-Mgmt, in addition have him come back after he is done with physical therapy..  Future Appointments Date Time Provider [place]  12/09/2016 9:30 AM Milinda Pointer, MD [facility] None   Primary Care Physician: Sofie Hartigan, MD Location: [facility]  Facility Note by: Kathlen Brunswick. Dossie Arbour, M.D, DABA, DABAPM, DABPM, DABIPP, FIPP Date: 08/30/16; Time: 5:22 PM  Pain Score Disclaimer: We use the NRS-11 scale. This is a self-reported, subjective measurement of pain severity with only modest accuracy. It is used primarily to identify changes within a particular patient. It must be understood that outpatient pain scales are significantly less accurate that those used for research, where they can be applied under ideal controlled circumstances with minimal exposure to variables. In reality, the score is likely to be a combination of pain intensity and pain affect, where pain affect describes the degree of emotional arousal or changes in action readiness caused by the sensory experience of pain. Factors such as social and work situation, setting, emotional state, anxiety levels, expectation, and prior pain experience may influence pain perception and show large inter-individual differences that may also be affected by time variables.  Patient instructions provided during this appointment: There are no Patient Instructions on file for this visit.

## 2016-08-30 NOTE — Progress Notes (Signed)
Nursing Pain Medication Assessment:  Safety precautions to be maintained throughout the outpatient stay will include: orient to surroundings, keep bed in low position, maintain call bell within reach at all times, provide assistance with transfer out of bed and ambulation.  Medication Inspection Compliance: Pill count conducted under aseptic conditions, in front of the patient. Neither the pills nor the bottle was removed from the patient's sight at any time. Once count was completed pills were immediately returned to the patient in their original bottle.  Medication: See above Pill Count: 20 of 180 pills remain Bottle Appearance: Standard pharmacy container. Clearly labeled. Filled Date: 52 / 13 / 2017 Medication last intake: 08/30/16 @ 1130

## 2016-09-01 ENCOUNTER — Other Ambulatory Visit: Payer: Self-pay | Admitting: Pain Medicine

## 2016-09-01 DIAGNOSIS — M792 Neuralgia and neuritis, unspecified: Secondary | ICD-10-CM

## 2016-09-27 ENCOUNTER — Ambulatory Visit (HOSPITAL_BASED_OUTPATIENT_CLINIC_OR_DEPARTMENT_OTHER): Payer: BLUE CROSS/BLUE SHIELD | Admitting: Pain Medicine

## 2016-09-27 ENCOUNTER — Ambulatory Visit
Admission: RE | Admit: 2016-09-27 | Discharge: 2016-09-27 | Disposition: A | Discharge status: Home / Self Care | Payer: BLUE CROSS/BLUE SHIELD | Source: Ambulatory Visit | Attending: Pain Medicine | Admitting: Pain Medicine

## 2016-09-27 ENCOUNTER — Encounter: Payer: Self-pay | Admitting: Pain Medicine

## 2016-09-27 VITALS — BP 134/100 | HR 77 | Temp 98.5°F | Resp 15 | Ht 69.0 in | Wt 185.0 lb

## 2016-09-27 DIAGNOSIS — M4722 Other spondylosis with radiculopathy, cervical region: Secondary | ICD-10-CM | POA: Insufficient documentation

## 2016-09-27 DIAGNOSIS — M542 Cervicalgia: Secondary | ICD-10-CM

## 2016-09-27 DIAGNOSIS — Z9889 Other specified postprocedural states: Secondary | ICD-10-CM | POA: Diagnosis not present

## 2016-09-27 DIAGNOSIS — M9981 Other biomechanical lesions of cervical region: Secondary | ICD-10-CM | POA: Diagnosis not present

## 2016-09-27 DIAGNOSIS — M4802 Spinal stenosis, cervical region: Secondary | ICD-10-CM | POA: Diagnosis not present

## 2016-09-27 DIAGNOSIS — G8929 Other chronic pain: Secondary | ICD-10-CM

## 2016-09-27 MED ORDER — FENTANYL CITRATE (PF) 100 MCG/2ML IJ SOLN: 25.0000 ug | INTRAMUSCULAR | Status: DC | PRN

## 2016-09-27 MED ORDER — LIDOCAINE HCL (PF) 1 % IJ SOLN
10.0000 mL | Freq: Once | INTRAMUSCULAR | Status: AC
  Administered 2016-09-27: 10 mL
  Filled 2016-09-27: qty 10

## 2016-09-27 MED ORDER — MIDAZOLAM HCL 5 MG/5ML IJ SOLN: 1.0000 mg | INTRAMUSCULAR | Status: DC | PRN

## 2016-09-27 MED ORDER — IOPAMIDOL (ISOVUE-M 200) INJECTION 41%
10.0000 mL | Freq: Once | INTRAMUSCULAR | Status: AC
  Administered 2016-09-27: 10 mL via EPIDURAL
  Filled 2016-09-27: qty 10

## 2016-09-27 MED ORDER — LACTATED RINGERS IV SOLN: 1000.0000 mL | Freq: Once | INTRAVENOUS | Status: DC

## 2016-09-27 MED ORDER — DEXAMETHASONE SODIUM PHOSPHATE 4 MG/ML IJ SOLN
10.0000 mg | Freq: Once | INTRAMUSCULAR | Status: AC
  Administered 2016-09-27: 10 mg
  Filled 2016-09-27: qty 3

## 2016-09-27 MED ORDER — ROPIVACAINE HCL 2 MG/ML IJ SOLN
1.0000 mL | Freq: Once | INTRAMUSCULAR | Status: AC
  Administered 2016-09-27: 1 mL via EPIDURAL
  Filled 2016-09-27: qty 10

## 2016-09-27 MED ORDER — SODIUM CHLORIDE 0.9% FLUSH
1.0000 mL | Freq: Once | INTRAVENOUS | Status: AC
  Administered 2016-09-27: 1 mL

## 2016-09-27 MED ORDER — LIDOCAINE HCL (PF) 1 % IJ SOLN
INTRAMUSCULAR | Status: AC
  Administered 2016-09-27: 11:00:00
  Filled 2016-09-27: qty 5

## 2016-09-27 NOTE — Patient Instructions (Signed)
Pain Management Discharge Instructions  General Discharge Instructions :  If you need to reach your doctor call: Monday-Friday 8:00 am - 4:00 pm at [phone] or toll free [phone].  After clinic hours [phone] to have operator reach doctor.  Bring all of your medication bottles to all your appointments in the pain clinic.  To cancel or reschedule your appointment with Pain Management please remember to call 24 hours in advance to avoid a fee.  Refer to the educational materials which you have been given on: General Risks, I had my Procedure. Discharge Instructions, Post Sedation.  Post Procedure Instructions:  The drugs you were given will stay in your system until tomorrow, so for the next 24 hours you should not drive, make any legal decisions or drink any alcoholic beverages.  You may eat anything you prefer, but it is better to start with liquids then soups and crackers, and gradually work up to solid foods.  Please notify your doctor immediately if you have any unusual bleeding, trouble breathing or pain that is not related to your normal pain.  Depending on the type of procedure that was done, some parts of your body may feel week and/or numb.  This usually clears up by tonight or the next day.  Walk with the use of an assistive device or accompanied by an adult for the 24 hours.  You may use ice on the affected area for the first 24 hours.  Put ice in a Ziploc bag and cover with a towel and place against area 15 minutes on 15 minutes off.  You may switch to heat after 24 hours.Epidural Steroid Injection Patient Information  Description: The epidural space surrounds the nerves as they exit the spinal cord.  In some patients, the nerves can be compressed and inflamed by a bulging disc or a tight spinal canal (spinal stenosis).  By injecting steroids into the epidural space, we can bring irritated nerves into direct contact with a potentially helpful medication.  These  steroids act directly on the irritated nerves and can reduce swelling and inflammation which often leads to decreased pain.  Epidural steroids may be injected anywhere along the spine and from the neck to the low back depending upon the location of your pain.   After numbing the skin with local anesthetic (like Novocaine), a small needle is passed into the epidural space slowly.  You may experience a sensation of pressure while this is being done.  The entire block usually last less than 10 minutes.  Conditions which may be treated by epidural steroids:   Low back and leg pain  Neck and arm pain  Spinal stenosis  Post-laminectomy syndrome  Herpes zoster (shingles) pain  Pain from compression fractures  Preparation for the injection:  1. Do not eat any solid food or dairy products within 8 hours of your appointment.  2. You may drink clear liquids up to 3 hours before appointment.  Clear liquids include water, black coffee, juice or soda.  No milk or cream please. 3. You may take your regular medication, including pain medications, with a sip of water before your appointment  Diabetics should hold regular insulin (if taken separately) and take 1/2 normal NPH dos the morning of the procedure.  Carry some sugar containing items with you to your appointment. 4. A driver must accompany you and be prepared to drive you home after your procedure.  5. Bring all your current medications with your. 6. An IV may be inserted and   sedation may be given at the discretion of the physician.   7. A blood pressure cuff, EKG and other monitors will often be applied during the procedure.  Some patients may need to have extra oxygen administered for a short period. 8. You will be asked to provide medical information, including your allergies, prior to the procedure.  We must know immediately if you are taking blood thinners (like Coumadin/Warfarin)  Or if you are allergic to IV iodine contrast (dye). We must  know if you could possible be pregnant.  Possible side-effects:  Bleeding from needle site  Infection (rare, may require surgery)  Nerve injury (rare)  Numbness & tingling (temporary)  Difficulty urinating (rare, temporary)  Spinal headache ( a headache worse with upright posture)  Light -headedness (temporary)  Pain at injection site (several days)  Decreased blood pressure (temporary)  Weakness in arm/leg (temporary)  Pressure sensation in back/neck (temporary)  Call if you experience:  Fever/chills associated with headache or increased back/neck pain.  Headache worsened by an upright position.  New onset weakness or numbness of an extremity below the injection site  Hives or difficulty breathing (go to the emergency room)  Inflammation or drainage at the infection site  Severe back/neck pain  Any new symptoms which are concerning to you  Please note:  Although the local anesthetic injected can often make your back or neck feel good for several hours after the injection, the pain will likely return.  It takes 3-7 days for steroids to work in the epidural space.  You may not notice any pain relief for at least that one week.  If effective, we will often do a series of three injections spaced 3-6 weeks apart to maximally decrease your pain.  After the initial series, we generally will wait several months before considering a repeat injection of the same type.  If you have any questions, please call [phone] [facility] 

## 2016-09-27 NOTE — Progress Notes (Signed)
Safety precautions to be maintained throughout the outpatient stay will include: orient to surroundings, keep bed in low position, maintain call bell within reach at all times, provide assistance with transfer out of bed and ambulation.  

## 2016-09-27 NOTE — Progress Notes (Signed)
Patient's Name: Ricky Mercer  MRN: [id number]  Referring Provider: Sofie Hartigan, MD  DOB: August 18, 1951  PCP: Sofie Hartigan, MD  DOS: 09/27/2016  Note by: Kathlen Brunswick. Dossie Arbour, MD  Service setting: Ambulatory outpatient  Location: [facility] (AMB) Pain Management Facility  Visit type: Procedure  Specialty: Interventional Pain Management  Patient type: Established   Primary Reason for Visit: Interventional Pain Management Treatment. CC: Neck Pain  Procedure:  Anesthesia, Analgesia, Anxiolysis:  Type: Palliative, Inter-Laminar, Epidural Steroid Injection Region: Posterior Cervico-thoracic Region Level: C7-T1 Laterality: Left-Sided Paramedial  Type: Local Anesthesia Local Anesthetic: Lidocaine 1% Route: Infiltration ([place]/IM) IV Access: Declined Sedation: Declined  Indication(s): Analgesia          Indications: 1. Osteoarthritis of spine with radiculopathy, cervical region   2. 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 Chronic neck pain (neck<shoulder) (L>R)    Pain Score: Pre-procedure: 4 /10 Post-procedure: (P) 4 /10  Pre-op Assessment:  Previous date of service: 08/30/16 Service provided: Med Refill Ricky Mercer is a 66 y.o. (year old), male patient, seen today for interventional treatment. He  has a past surgical history that includes Nose surgery; Hand surgery (Bilateral); Colonoscopy (10/24/2012); Upper gi endoscopy (12/18/2012); Tonsillectomy (2009); Skin cancer excision (Left, 2015); Mass excision (Right, 07/18/2015); Esophagogastroduodenoscopy (egd) with propofol (N/A, 10/28/2015); prostate seeding; Hernia repair; and Cholecystectomy (N/A, 12/02/2015). His primarily concern today is the Neck Pain  Blood pressure (!) 134/100, pulse 77, temperature 98.5 F (36.9 C), temperature source Oral, resp. rate (P) 16, height 5\' 9"  (1.753 m), weight 185 lb (83.9 kg), SpO2 (P) 95 %.Calculated BMI: Body mass index is 27.32 kg/m. Risk Assessment: Allergies: Reviewed. He has No  Known Allergies. Allergy Precautions: None required Coagulopathies: "Reviewed. None identified. Blood-thinner therapy: None at this time Active Infection(s): Reviewed. None identified. Ricky Mercer is afebrile Site Confirmation: Ricky Mercer was asked to confirm the procedure and laterality before marking the site Procedure checklist: Completed Consent: Before the procedure and under the influence of no sedative(s), amnesic(s), or anxiolytics, the patient was informed of the treatment options, risks and possible complications. To fulfill our ethical and legal obligations, as recommended by the American Medical Association's Code of Ethics, I have informed the patient of my clinical impression; the nature and purpose of the treatment or procedure; the risks, benefits, and possible complications of the intervention; the alternatives, including doing nothing; the risk(s) and benefit(s) of the alternative treatment(s) or procedure(s); and the risk(s) and benefit(s) of doing nothing. The patient was provided information about the general risks and possible complications associated with the procedure. These may include, but are not limited to: failure to achieve desired goals, infection, bleeding, organ or nerve damage, allergic reactions, paralysis, and death. In addition, the patient was informed of those risks and complications associated to Spine-related procedures, such as failure to decrease pain; infection (i.e.: Meningitis, epidural or intraspinal abscess); bleeding (i.e.: epidural hematoma, subarachnoid hemorrhage, or any other type of intraspinal or peri-dural bleeding); organ or nerve damage (i.e.: Any type of peripheral nerve, nerve root, or spinal cord injury) with subsequent damage to sensory, motor, and/or autonomic systems, resulting in permanent pain, numbness, and/or weakness of one or several areas of the body; allergic reactions; (i.e.: anaphylactic reaction); and/or death. Furthermore, the  patient was informed of those risks and complications associated with the medications. These include, but are not limited to: allergic reactions (i.e.: anaphylactic or anaphylactoid reaction(s)); adrenal axis suppression; blood sugar elevation that in diabetics may result in ketoacidosis or  comma; water retention that in patients with history of congestive heart failure may result in shortness of breath, pulmonary edema, and decompensation with resultant heart failure; weight gain; swelling or edema; medication-induced neural toxicity; particulate matter embolism and blood vessel occlusion with resultant organ, and/or nervous system infarction; and/or aseptic necrosis of one or more joints. Finally, the patient was informed that Medicine is not an exact science; therefore, there is also the possibility of unforeseen or unpredictable risks and/or possible complications that may result in a catastrophic outcome. The patient indicated having understood very clearly. We have given the patient no guarantees and we have made no promises. Enough time was given to the patient to ask questions, all of which were answered to the patient's satisfaction. [name] has indicated that he wanted to continue with the procedure. Attestation: I, the ordering provider, attest that I have discussed with the patient the benefits, risks, side-effects, alternatives, likelihood of achieving goals, and potential problems during recovery for the procedure that I have provided informed consent. Date: 09/27/2016; Time: 9:22 AM  Pre-Procedure Preparation:  Monitoring: As per clinic protocol. Respiration, ETCO2, SpO2, BP, heart rate and rhythm monitor placed and checked for adequate function Safety Precautions: Patient was assessed for positional comfort and pressure points before starting the procedure. Time-out: I initiated and conducted the "Time-out" before starting the procedure, as per protocol. The patient was asked to  participate by confirming the accuracy of the "Time Out" information. Verification of the correct person, site, and procedure were performed and confirmed by me, the nursing staff, and the patient. "Time-out" conducted as per Joint Commission's Universal Protocol (UP.01.01.01). Date: 09/27/2016; Time: 1100 hrs.  Description of Procedure Process:   Position: Prone with head of the table was raised to facilitate breathing. Target Area: For Epidural Steroid injections the target is the interlaminar space, initially targeting the lower border of the superior vertebral body lamina. Approach: Paramedial approach. Area Prepped: Entire PosteriorCervical Region Prepping solution: ChloraPrep (2% chlorhexidine gluconate and 70% isopropyl alcohol) Safety Precautions: Aspiration looking for blood return was conducted prior to all injections. At no point did we inject any substances, as a needle was being advanced. No attempts were made at seeking any paresthesias. Safe injection practices and needle disposal techniques used. Medications properly checked for expiration dates. SDV (single dose vial) medications used. Description of the Procedure: Protocol guidelines were followed. The procedure needle was introduced through the skin, ipsilateral to the reported pain, and advanced to the target area. Bone was contacted and the needle walked caudad, until the lamina was cleared. The epidural space was identified using "loss-of-resistance technique" with 2-3 ml of PF-NaCl (0.9% NSS), in a 5cc LOR glass syringe. Materials:  Needle(s) Type: Epidural needle Gauge: 22G Length: 3.5-in Medication(s): We administered dexamethasone, iopamidol, lidocaine (PF), sodium chloride flush, ropivacaine (PF) 2 mg/mL (0.2%), and lidocaine (PF). Please see chart orders for dosing details.  Imaging Guidance (Spinal):  Type of Imaging Technique: Fluoroscopy Guidance (Spinal) Indication(s): Assistance in needle guidance and placement for  procedures requiring needle placement in or near specific anatomical locations not easily accessible without such assistance. Exposure Time: Please see nurses notes. Contrast: Before injecting any contrast, we confirmed that the patient did not have an allergy to iodine, shellfish, or radiological contrast. Once satisfactory needle placement was completed at the desired level, radiological contrast was injected. Contrast injected under live fluoroscopy. No contrast complications. See chart for type and volume of contrast used. Fluoroscopic Guidance: I was personally present during the use of  fluoroscopy. "Tunnel Vision Technique" used to obtain the best possible view of the target area. Parallax error corrected before commencing the procedure. "Direction-depth-direction" technique used to introduce the needle under continuous pulsed fluoroscopy. Once target was reached, antero-posterior, oblique, and lateral fluoroscopic projection used confirm needle placement in all planes. Images permanently stored in EMR. Interpretation: I personally interpreted the imaging intraoperatively. Adequate needle placement confirmed in multiple planes. Appropriate spread of contrast into desired area was observed. No evidence of afferent or efferent intravascular uptake. No intrathecal or subarachnoid spread observed. Permanent images saved into the patient's record.  Antibiotic Prophylaxis:  Indication(s): None identified Antibiotic given: None Post-operative Assessment:  EBL: None Complications: No immediate post-treatment complications observed by team, or reported by patient. Note: The patient tolerated the entire procedure well. A repeat set of vitals were taken after the procedure and the patient was kept under observation following institutional policy, for this type of procedure. Post-procedural neurological assessment was performed, showing return to baseline, prior to discharge. The patient was provided with  post-procedure discharge instructions, including a section on how to identify potential problems. Should any problems arise concerning this procedure, the patient was given instructions to immediately contact us, at any time, without hesitation. In any case, we plan to contact the patient by telephone for a follow-up status report regarding this interventional procedure. Comments:  No additional relevant information.  Plan of Care  Disposition: Discharge home Discharge Date: 09/27/2016; Discharge Time:   hrs Physician-requested Follow-up:  Return in about 2 weeks (around 10/11/2016) for Post-Procedure evaluation.  Future Appointments Date Time Provider [place]  11/01/2016 1:00 PM Milinda Pointer, MD [facility] None  12/09/2016 9:30 AM Milinda Pointer, MD [facility] None   Medications ordered for procedure: Meds ordered this encounter  Medications  . DISCONTD: fentaNYL (SUBLIMAZE) injection 25-50 mcg    Make sure Narcan is available in the pyxis when using this medication. In the event of respiratory depression (RR< 8/min): Titrate NARCAN (naloxone) in increments of 0.1 to 0.2 mg IV at 2-3 minute intervals, until desired degree of reversal.  . DISCONTD: lactated ringers infusion 1,000 mL  . DISCONTD: midazolam (VERSED) 5 MG/5ML injection 1-2 mg    Make sure Flumazenil is available in the pyxis when using this medication. If oversedation occurs, administer 0.2 mg IV over 15 sec. If after 45 sec no response, administer 0.2 mg again over 1 min; may repeat at 1 min intervals; not to exceed 4 doses (1 mg)  . dexamethasone (DECADRON) injection 10 mg  . iopamidol (ISOVUE-M) 41 % intrathecal injection 10 mL  . lidocaine (PF) (XYLOCAINE) 1 % injection 10 mL  . sodium chloride flush (NS) 0.9 % injection 1 mL  . ropivacaine (PF) 2 mg/mL (0.2%) (NAROPIN) injection 1 mL  . lidocaine (PF) (XYLOCAINE) 1 % injection    Willeen Cass L: cabinet override   Medications administered: We  administered dexamethasone, iopamidol, lidocaine (PF), sodium chloride flush, ropivacaine (PF) 2 mg/mL (0.2%), and lidocaine (PF).  See the medical record for exact dosing, route, and time of administration.  Lab-work, Procedure(s), & Referral(s) Ordered: Orders Placed This Encounter  Procedures  . Cervical Epidural Injection  . DG C-Arm 1-60 Min-No Report  . Discharge instructions  . Provider attestation of informed consent for procedure/surgical case  . Verify informed consent  . Follow-up  . Informed Consent Details: Transcribe to consent form and obtain patient signature   Imaging Ordered: No results found for this or any previous visit. New Prescriptions   No  medications on file    Primary Care Physician: Sofie Hartigan, MD Location: [facility] Note by: Kathlen Brunswick. Dossie Arbour, M.D, DABA, DABAPM, DABPM, DABIPP, FIPP Date: 09/27/2016; Time: 1:47 PM  Disclaimer:  Medicine is not an Chief Strategy Officer. The only guarantee in medicine is that nothing is guaranteed. It is important to note that the decision to proceed with this intervention was based on the information collected from the patient. The Data and conclusions were drawn from the patient's questionnaire, the interview, and the physical examination. Because the information was provided in large part by the patient, it cannot be guaranteed that it has not been purposely or unconsciously manipulated. Every effort has been made to obtain as much relevant data as possible for this evaluation. It is important to note that the conclusions that lead to this procedure are derived in large part from the available data. Always take into account that the treatment will also be dependent on availability of resources and existing treatment guidelines, considered by other Pain Management Practitioners as being common knowledge and practice, at the time of the intervention. For Medico-Legal purposes, it is also important to  point out that variation in procedural techniques and pharmacological choices are the acceptable norm. The indications, contraindications, technique, and results of the above procedure should only be interpreted and judged by a Board-Certified Interventional Pain Specialist with extensive familiarity and expertise in the same exact procedure and technique. Attempts at providing opinions without similar or greater experience and expertise than that of the treating physician will be considered as inappropriate and unethical, and shall result in a formal complaint to the state medical board and applicable specialty societies.  Instructions provided at this appointment: Patient Instructions  Pain Management Discharge Instructions  General Discharge Instructions :  If you need to reach your doctor call: Monday-Friday 8:00 am - 4:00 pm at [phone] or toll free [phone].  After clinic hours [phone] to have operator reach doctor.  Bring all of your medication bottles to all your appointments in the pain clinic.  To cancel or reschedule your appointment with Pain Management please remember to call 24 hours in advance to avoid a fee.  Refer to the educational materials which you have been given on: General Risks, I had my Procedure. Discharge Instructions, Post Sedation.  Post Procedure Instructions:  The drugs you were given will stay in your system until tomorrow, so for the next 24 hours you should not drive, make any legal decisions or drink any alcoholic beverages.  You may eat anything you prefer, but it is better to start with liquids then soups and crackers, and gradually work up to solid foods.  Please notify your doctor immediately if you have any unusual bleeding, trouble breathing or pain that is not related to your normal pain.  Depending on the type of procedure that was done, some parts of your body may feel week and/or numb.  This usually clears up by tonight or the next  day.  Walk with the use of an assistive device or accompanied by an adult for the 24 hours.  You may use ice on the affected area for the first 24 hours.  Put ice in a Ziploc bag and cover with a towel and place against area 15 minutes on 15 minutes off.  You may switch to heat after 24 hours.Epidural Steroid Injection Patient Information  Description: The epidural space surrounds the nerves as they exit the spinal cord.  In some patients, the  nerves can be compressed and inflamed by a bulging disc or a tight spinal canal (spinal stenosis).  By injecting steroids into the epidural space, we can bring irritated nerves into direct contact with a potentially helpful medication.  These steroids act directly on the irritated nerves and can reduce swelling and inflammation which often leads to decreased pain.  Epidural steroids may be injected anywhere along the spine and from the neck to the low back depending upon the location of your pain.   After numbing the skin with local anesthetic (like Novocaine), a small needle is passed into the epidural space slowly.  You may experience a sensation of pressure while this is being done.  The entire block usually last less than 10 minutes.  Conditions which may be treated by epidural steroids:   Low back and leg pain  Neck and arm pain  Spinal stenosis  Post-laminectomy syndrome  Herpes zoster (shingles) pain  Pain from compression fractures  Preparation for the injection:  1. Do not eat any solid food or dairy products within 8 hours of your appointment.  2. You may drink clear liquids up to 3 hours before appointment.  Clear liquids include water, black coffee, juice or soda.  No milk or cream please. 3. You may take your regular medication, including pain medications, with a sip of water before your appointment  Diabetics should hold regular insulin (if taken separately) and take 1/2 normal NPH dos the morning of the procedure.  Carry some sugar  containing items with you to your appointment. 4. A driver must accompany you and be prepared to drive you home after your procedure.  5. Bring all your current medications with your. 6. An IV may be inserted and sedation may be given at the discretion of the physician.   7. A blood pressure cuff, EKG and other monitors will often be applied during the procedure.  Some patients may need to have extra oxygen administered for a short period. 8. You will be asked to provide medical information, including your allergies, prior to the procedure.  We must know immediately if you are taking blood thinners (like Coumadin/Warfarin)  Or if you are allergic to IV iodine contrast (dye). We must know if you could possible be pregnant.  Possible side-effects:  Bleeding from needle site  Infection (rare, may require surgery)  Nerve injury (rare)  Numbness & tingling (temporary)  Difficulty urinating (rare, temporary)  Spinal headache ( a headache worse with upright posture)  Light -headedness (temporary)  Pain at injection site (several days)  Decreased blood pressure (temporary)  Weakness in arm/leg (temporary)  Pressure sensation in back/neck (temporary)  Call if you experience:  Fever/chills associated with headache or increased back/neck pain.  Headache worsened by an upright position.  New onset weakness or numbness of an extremity below the injection site  Hives or difficulty breathing (go to the emergency room)  Inflammation or drainage at the infection site  Severe back/neck pain  Any new symptoms which are concerning to you  Please note:  Although the local anesthetic injected can often make your back or neck feel good for several hours after the injection, the pain will likely return.  It takes 3-7 days for steroids to work in the epidural space.  You may not notice any pain relief for at least that one week.  If effective, we will often do a series of three injections  spaced 3-6 weeks apart to maximally decrease your pain.  After the  initial series, we generally will wait several months before considering a repeat injection of the same type.  If you have any questions, please call [phone] [facility]

## 2016-09-28 ENCOUNTER — Telehealth: Payer: Self-pay

## 2016-09-28 NOTE — Telephone Encounter (Signed)
No answer left message to call if needed. 

## 2016-11-01 ENCOUNTER — Ambulatory Visit: Payer: BLUE CROSS/BLUE SHIELD | Attending: Pain Medicine | Admitting: Pain Medicine

## 2016-11-01 ENCOUNTER — Encounter: Payer: Self-pay | Admitting: Pain Medicine

## 2016-11-01 VITALS — BP 141/98 | HR 78 | Temp 97.9°F | Resp 18 | Ht 69.0 in | Wt 185.0 lb

## 2016-11-01 DIAGNOSIS — K219 Gastro-esophageal reflux disease without esophagitis: Secondary | ICD-10-CM | POA: Insufficient documentation

## 2016-11-01 DIAGNOSIS — E7800 Pure hypercholesterolemia, unspecified: Secondary | ICD-10-CM | POA: Diagnosis not present

## 2016-11-01 DIAGNOSIS — R531 Weakness: Secondary | ICD-10-CM | POA: Insufficient documentation

## 2016-11-01 DIAGNOSIS — M79605 Pain in left leg: Secondary | ICD-10-CM | POA: Insufficient documentation

## 2016-11-01 DIAGNOSIS — I429 Cardiomyopathy, unspecified: Secondary | ICD-10-CM | POA: Insufficient documentation

## 2016-11-01 DIAGNOSIS — G8929 Other chronic pain: Secondary | ICD-10-CM | POA: Insufficient documentation

## 2016-11-01 DIAGNOSIS — M25551 Pain in right hip: Secondary | ICD-10-CM | POA: Diagnosis not present

## 2016-11-01 DIAGNOSIS — M4722 Other spondylosis with radiculopathy, cervical region: Secondary | ICD-10-CM

## 2016-11-01 DIAGNOSIS — M79604 Pain in right leg: Secondary | ICD-10-CM | POA: Insufficient documentation

## 2016-11-01 DIAGNOSIS — M25561 Pain in right knee: Secondary | ICD-10-CM | POA: Insufficient documentation

## 2016-11-01 DIAGNOSIS — M4802 Spinal stenosis, cervical region: Secondary | ICD-10-CM | POA: Insufficient documentation

## 2016-11-01 DIAGNOSIS — G4733 Obstructive sleep apnea (adult) (pediatric): Secondary | ICD-10-CM | POA: Insufficient documentation

## 2016-11-01 DIAGNOSIS — M79602 Pain in left arm: Secondary | ICD-10-CM | POA: Diagnosis not present

## 2016-11-01 DIAGNOSIS — J45909 Unspecified asthma, uncomplicated: Secondary | ICD-10-CM | POA: Insufficient documentation

## 2016-11-01 DIAGNOSIS — M545 Low back pain: Secondary | ICD-10-CM | POA: Diagnosis not present

## 2016-11-01 DIAGNOSIS — K5903 Drug induced constipation: Secondary | ICD-10-CM | POA: Insufficient documentation

## 2016-11-01 DIAGNOSIS — Z7982 Long term (current) use of aspirin: Secondary | ICD-10-CM | POA: Insufficient documentation

## 2016-11-01 DIAGNOSIS — Z85828 Personal history of other malignant neoplasm of skin: Secondary | ICD-10-CM | POA: Insufficient documentation

## 2016-11-01 DIAGNOSIS — M542 Cervicalgia: Secondary | ICD-10-CM | POA: Insufficient documentation

## 2016-11-01 DIAGNOSIS — Z8546 Personal history of malignant neoplasm of prostate: Secondary | ICD-10-CM | POA: Insufficient documentation

## 2016-11-01 DIAGNOSIS — M961 Postlaminectomy syndrome, not elsewhere classified: Secondary | ICD-10-CM | POA: Insufficient documentation

## 2016-11-01 DIAGNOSIS — M549 Dorsalgia, unspecified: Secondary | ICD-10-CM | POA: Diagnosis present

## 2016-11-01 DIAGNOSIS — M25552 Pain in left hip: Secondary | ICD-10-CM | POA: Insufficient documentation

## 2016-11-01 DIAGNOSIS — M9981 Other biomechanical lesions of cervical region: Secondary | ICD-10-CM

## 2016-11-01 DIAGNOSIS — M546 Pain in thoracic spine: Secondary | ICD-10-CM | POA: Insufficient documentation

## 2016-11-01 DIAGNOSIS — G629 Polyneuropathy, unspecified: Secondary | ICD-10-CM | POA: Diagnosis not present

## 2016-11-01 DIAGNOSIS — Z79891 Long term (current) use of opiate analgesic: Secondary | ICD-10-CM | POA: Diagnosis not present

## 2016-11-01 DIAGNOSIS — M533 Sacrococcygeal disorders, not elsewhere classified: Secondary | ICD-10-CM | POA: Diagnosis not present

## 2016-11-01 DIAGNOSIS — R079 Chest pain, unspecified: Secondary | ICD-10-CM | POA: Insufficient documentation

## 2016-11-01 DIAGNOSIS — M720 Palmar fascial fibromatosis [Dupuytren]: Secondary | ICD-10-CM | POA: Insufficient documentation

## 2016-11-01 DIAGNOSIS — M4726 Other spondylosis with radiculopathy, lumbar region: Secondary | ICD-10-CM | POA: Diagnosis not present

## 2016-11-01 DIAGNOSIS — M79673 Pain in unspecified foot: Secondary | ICD-10-CM | POA: Diagnosis not present

## 2016-11-01 DIAGNOSIS — M79601 Pain in right arm: Secondary | ICD-10-CM | POA: Diagnosis not present

## 2016-11-01 DIAGNOSIS — M791 Myalgia: Secondary | ICD-10-CM | POA: Insufficient documentation

## 2016-11-01 NOTE — Progress Notes (Signed)
Safety precautions to be maintained throughout the outpatient stay will include: orient to surroundings, keep bed in low position, maintain call bell within reach at all times, provide assistance with transfer out of bed and ambulation.  

## 2016-11-01 NOTE — Patient Instructions (Addendum)
Preparing for your procedure (without sedation) Instructions: . Oral Intake: Do not eat or drink anything for at least 3 hours prior to your procedure. . Transportation: Unless otherwise stated by your physician, you may drive yourself after the procedure. . Blood Pressure Medicine: Take your blood pressure medicine with a sip of water the morning of the procedure. . Insulin: Take only  of your normal insulin dose. . Preventing infections: Shower with an antibacterial soap the morning of your procedure. . Build-up your immune system: Take 1000 mg of Vitamin C with every meal (3 times a day) the day prior to your procedure. . Pregnancy: If you are pregnant, call and cancel the procedure. . Sickness: If you have a cold, fever, or any active infections, call and cancel the procedure. . Arrival: You must be in the facility at least 30 minutes prior to your scheduled procedure. . Children: Do not bring any children with you. . Dress appropriately: Bring dark clothing that you would not mind if they get stained. . Valuables: Do not bring any jewelry or valuables. Procedure appointments are reserved for interventional treatments only. Marland Kitchen No Prescription Refills. . No medication changes will be discussed during procedure appointments. No disability issues will be discussed.Epidural Steroid Injection Patient Information  Description: The epidural space surrounds the nerves as they exit the spinal cord.  In some patients, the nerves can be compressed and inflamed by a bulging disc or a tight spinal canal (spinal stenosis).  By injecting steroids into the epidural space, we can bring irritated nerves into direct contact with a potentially helpful medication.  These steroids act directly on the irritated nerves and can reduce swelling and inflammation which often leads to decreased pain.  Epidural steroids may be injected anywhere along the spine and from the neck to the low back depending upon the location  of your pain.   After numbing the skin with local anesthetic (like Novocaine), a small needle is passed into the epidural space slowly.  You may experience a sensation of pressure while this is being done.  The entire block usually last less than 10 minutes.  Conditions which may be treated by epidural steroids:   Low back and leg pain  Neck and arm pain  Spinal stenosis  Post-laminectomy syndrome  Herpes zoster (shingles) pain  Pain from compression fractures  Preparation for the injection:  1. Do not eat any solid food or dairy products within 8 hours of your appointment.  2. You may drink clear liquids up to 3 hours before appointment.  Clear liquids include water, black coffee, juice or soda.  No milk or cream please. 3. You may take your regular medication, including pain medications, with a sip of water before your appointment  Diabetics should hold regular insulin (if taken separately) and take 1/2 normal NPH dos the morning of the procedure.  Carry some sugar containing items with you to your appointment. 4. A driver must accompany you and be prepared to drive you home after your procedure.  5. Bring all your current medications with your. 6. An IV may be inserted and sedation may be given at the discretion of the physician.   7. A blood pressure cuff, EKG and other monitors will often be applied during the procedure.  Some patients may need to have extra oxygen administered for a short period. 8. You will be asked to provide medical information, including your allergies, prior to the procedure.  We must know immediately if you are taking  blood thinners (like Coumadin/Warfarin)  Or if you are allergic to IV iodine contrast (dye). We must know if you could possible be pregnant.  Possible side-effects:  Bleeding from needle site  Infection (rare, may require surgery)  Nerve injury (rare)  Numbness & tingling (temporary)  Difficulty urinating (rare, temporary)  Spinal  headache ( a headache worse with upright posture)  Light -headedness (temporary)  Pain at injection site (several days)  Decreased blood pressure (temporary)  Weakness in arm/leg (temporary)  Pressure sensation in back/neck (temporary)  Call if you experience:  Fever/chills associated with headache or increased back/neck pain.  Headache worsened by an upright position.  New onset weakness or numbness of an extremity below the injection site  Hives or difficulty breathing (go to the emergency room)  Inflammation or drainage at the infection site  Severe back/neck pain  Any new symptoms which are concerning to you  Please note:  Although the local anesthetic injected can often make your back or neck feel good for several hours after the injection, the pain will likely return.  It takes 3-7 days for steroids to work in the epidural space.  You may not notice any pain relief for at least that one week.  If effective, we will often do a series of three injections spaced 3-6 weeks apart to maximally decrease your pain.  After the initial series, we generally will wait several months before considering a repeat injection of the same type.  If you have any questions, please call [phone] [facility]  Pain Score  Introduction: The pain score used by this practice is the Verbal Numerical Rating Scale (VNRS-11). This is an 11-point scale. It is for adults and children 10 years or older. There are significant differences in how the pain score is reported, used, and applied. Forget everything you learned in the past and learn this scoring system.  General Information: The scale should reflect your current level of pain. Unless you are specifically asked for the level of your worst pain, or your average pain. If you are asked for one of these two, then it should be understood that it is over the past 24 hours.  Basic Activities of Daily Living  (ADL): Personal hygiene, dressing, eating, transferring, and using restroom.  Instructions: Most patients tend to report their level of pain as a combination of two factors, their physical pain and their psychosocial pain. This last one is also known as "suffering" and it is reflection of how physical pain affects you socially and psychologically. From now on, report them separately. From this point on, when asked to report your pain level, report only your physical pain. Use the following table for reference.  Pain Clinic Pain Levels (0-5/10)  Pain Level Score Description  No Pain 0   Mild pain 1 Nagging, annoying, but does not interfere with basic activities of daily living (ADL). Patients are able to eat, bathe, get dressed, toileting (being able to get on and off the toilet and perform personal hygiene functions), transfer (move in and out of bed or a chair without assistance), and maintain continence (able to control bladder and bowel functions). Blood pressure and heart rate are unaffected. A normal heart rate for a healthy adult ranges from 60 to 100 bpm (beats per minute).   Mild to moderate pain 2 Noticeable and distracting. Impossible to hide from other people. More frequent flare-ups. Still possible to adapt and function close to normal. It can be  very annoying and may have occasional stronger flare-ups. With discipline, patients may get used to it and adapt.   Moderate pain 3 Interferes significantly with activities of daily living (ADL). It becomes difficult to feed, bathe, get dressed, get on and off the toilet or to perform personal hygiene functions. Difficult to get in and out of bed or a chair without assistance. Very distracting. With effort, it can be ignored when deeply involved in activities.   Moderately severe pain 4 Impossible to ignore for more than a few minutes. With effort, patients may still be able to manage work or participate in some social activities. Very difficult to  concentrate. Signs of autonomic nervous system discharge are evident: dilated pupils (mydriasis); mild sweating (diaphoresis); sleep interference. Heart rate becomes elevated (>115 bpm). Diastolic blood pressure (lower number) rises above 100 mmHg. Patients find relief in laying down and not moving.   Severe pain 5 Intense and extremely unpleasant. Associated with frowning face and frequent crying. Pain overwhelms the senses.  Ability to do any activity or maintain social relationships becomes significantly limited. Conversation becomes difficult. Pacing back and forth is common, as getting into a comfortable position is nearly impossible. Pain wakes you up from deep sleep. Physical signs will be obvious: pupillary dilation; increased sweating; goosebumps; brisk reflexes; cold, clammy hands and feet; nausea, vomiting or dry heaves; loss of appetite; significant sleep disturbance with inability to fall asleep or to remain asleep. When persistent, significant weight loss is observed due to the complete loss of appetite and sleep deprivation.  Blood pressure and heart rate becomes significantly elevated. Caution: If elevated blood pressure triggers a pounding headache associated with blurred vision, then the patient should immediately seek attention at an urgent or emergency care unit, as these may be signs of an impending stroke.    Emergency Department Pain Levels (6-10/10)  Emergency Room Pain 6 Severely limiting. Requires emergency care and should not be seen or managed at an outpatient pain management facility. Communication becomes difficult and requires great effort. Assistance to reach the emergency department may be required. Facial flushing and profuse sweating along with potentially dangerous increases in heart rate and blood pressure will be evident.   Distressing pain 7 Self-care is very difficult. Assistance is required to transport, or use restroom. Assistance to reach the emergency department  will be required. Tasks requiring coordination, such as bathing and getting dressed become very difficult.   Disabling pain 8 Self-care is no longer possible. At this level, pain is disabling. The individual is unable to do even the most "basic" activities such as walking, eating, bathing, dressing, transferring to a bed, or toileting. Fine motor skills are lost. It is difficult to think clearly.   Incapacitating pain 9 Pain becomes incapacitating. Thought processing is no longer possible. Difficult to remember your own name. Control of movement and coordination are lost.   The worst pain imaginable 10 At this level, most patients pass out from pain. When this level is reached, collapse of the autonomic nervous system occurs, leading to a sudden drop in blood pressure and heart rate. This in turn results in a temporary and dramatic drop in blood flow to the brain, leading to a loss of consciousness. Fainting is one of the body's self defense mechanisms. Passing out puts the brain in a calmed state and causes it to shut down for a while, in order to begin the healing process.    Summary: 1. Refer to this scale when providing [place] with  your pain level. 2. Be accurate and careful when reporting your pain level. This will help with your care. 3. Over-reporting your pain level will lead to loss of credibility. 4. Even a level of 1/10 means that there is pain and will be treated at our facility. 5. High, inaccurate reporting will be documented as "Symptom Exaggeration", leading to loss of credibility and suspicions of possible secondary gains such as obtaining more narcotics, or wanting to appear disabled, for fraudulent reasons. 6. Only pain levels of 5 or below will be seen at our facility. 7. Pain levels of 6 and above will be sent to the Emergency Department and the appointment cancelled. _____________________________________________________________________________________________

## 2016-11-01 NOTE — Progress Notes (Signed)
Patient's Name: Ricky Mercer  MRN: [id number]  Referring Provider: Sofie Hartigan, MD  DOB: 1951-01-20  PCP: Sofie Hartigan, MD  DOS: 11/01/2016  Note by: Kathlen Brunswick. Dossie Arbour, MD  Service setting: Ambulatory outpatient  Specialty: Interventional Pain Management  Location: [facility] (AMB) Pain Management Facility    Patient type: Established   Primary Reason(s) for Visit: Encounter for post-procedure evaluation of chronic illness with mild to moderate exacerbation CC: Back Pain; Hip Pain; and Neck Pain  HPI  Ricky Mercer is a 66 y.o. year old, male patient, who comes today for a post-procedure evaluation. He has Hand weakness; Polyradiculopathy; Long term current use of opiate analgesic; Long term prescription opiate use; Opiate use (45 MME/Day); Opiate dependence ([place]); Encounter for therapeutic drug level monitoring; Chronic low back pain  (Location of Primary Pain) (Bilateral) (L>R); Chronic lower extremity pain (Location of Tertiary source of pain) (Bilateral) (L>R); Chronic lumbar radicular pain (Bilateral) (L>R) (Bilateral S1 dermatomal distribution); Chronic hip pain (Location of Secondary source of pain) (Bilateral) (L>R); Chronic upper back pain (Bilateral) (L>R); Chronic neck pain (neck<shoulder) (L>R); Chronic foot pain; Chronic knee pain (Right); Cervical spondylosis; 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); Chronic upper extremity pain (Bilateral) (L>R); Lumbar spondylosis; Cervical facet hypertrophy; Cervical facet syndrome (R>L); History of prostate cancer; Radicular pain of shoulder; Class I Morbid obesity (HCC) (68% higher incidence of chronic low back problems); Peripheral neuropathy (HCC) (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); Lumbar facet syndrome (Bilateral) (L>R); Sleep apnea; Depression; High cholesterol; Low testosterone; History of surgical procedure; Opioid-induced constipation (OIC); Chronic shoulder pain (Bilateral) (L>R); Breathing orally;  Chest pain; Primary cardiomyopathy ([place]); Chronic sacroiliac joint pain (Bilateral) (L>R); Musculoskeletal pain; Pain of right foot; Neurogenic pain; Chest pain with high risk for cardiac etiology; and Chronic pain syndrome on his problem list. His primarily concern today is the Back Pain; Hip Pain; and Neck Pain  Pain Assessment: Self-Reported Pain Score: 4 /10 Clinically the patient looks like a 1/10 Reported level is inconsistent with clinical observations. Information on the proper use of the pain scale provided to the patient today Pain Type: Chronic pain Pain Location: Hip (back, neck) Pain Orientation: Right, Left (both hips, low back, left neck) Pain Descriptors / Indicators: Aching Pain Frequency: Constant  Ricky Mercer comes in today for post-procedure evaluation after the treatment done on 09/27/2016.  Further details on both, my assessment(s), as well as the proposed treatment plan, please see below.  Post-Procedure Assessment  09/27/2016 Procedure: Diagnostic left sided cervical epidural steroid injection under fluoroscopic guidance, no sedation. Post-procedure pain score: 4/10 No relief Influential Factors: BMI: 27.32 kg/m Intra-procedural challenges: None observed Assessment challenges: None detected         Post-procedural side-effects, adverse reactions, or complications: None reported Reported issues: None  Sedation: No sedation used. When no sedatives are used, the analgesic levels obtained are directly associated to the effectiveness of the local anesthetics. However, when sedation is provided, the level of analgesia obtained during the initial 1 hour following the intervention, is believed to be the result of a combination of factors. These factors may include, but are not limited to: 1. The effectiveness of the local anesthetics used. 2. The effects of the analgesic(s) and/or anxiolytic(s) used. 3. The degree of discomfort experienced by the patient at the time of the  procedure. 4. The patients ability and reliability in recalling and recording the events. 5. The presence and influence of possible secondary gains and/or psychosocial factors. Reported result: Relief experienced  during the 1st hour after the procedure: 100 % (Ultra-Short Term Relief) Interpretative annotation: No Analgesic or Anxiolytic given, therefore benefits are completely due to Local Anesthetics.          Effects of local anesthetic: The analgesic effects attained during this period are directly associated to the localized infiltration of local anesthetics and therefore cary significant diagnostic value as to the etiological location, or anatomical origin, of the pain. Expected duration of relief is directly dependent on the pharmacodynamics of the local anesthetic used. Long-acting (4-6 hours) anesthetics used.  Reported result: Relief during the next 4 to 6 hour after the procedure: 100 % (Short-Term Relief) Interpretative annotation: Complete relief would suggest area to be the source of the pain.          Long-term benefit: Defined as the period of time past the expected duration of local anesthetics. With the possible exception of prolonged sympathetic blockade from the local anesthetics, benefits during this period are typically attributed to, or associated with, other factors such as analgesic sensory neuropraxia, antiinflammatory effects, or beneficial biochemical changes provided by agents other than the local anesthetics Reported result: Extended relief following procedure: 90 % (lasting 2 weeks) (Long-Term Relief) Interpretative annotation: Good relief. This could suggest inflammation to be a significant component in the etiology to the pain.          Current benefits: Defined as persistent relief that continues at this point in time.   Reported results: Treated area: <25 % Mr. Myung reports improvement in function Interpretative annotation: Recurrance of symptoms. This would  suggest persistent aggravating factors  Interpretation: Results would suggest a successful diagnostic intervention.          Laboratory Chemistry  Inflammation Markers Lab Results  Component Value Date   ESRSEDRATE 4 08/21/2015   CRP <0.5 08/21/2015   Renal Function Lab Results  Component Value Date   BUN 13 11/25/2015   CREATININE 0.96 11/25/2015   GFRAA >60 11/25/2015   GFRNONAA >60 11/25/2015   Hepatic Function Lab Results  Component Value Date   AST 31 11/25/2015   ALT 41 11/25/2015   ALBUMIN 3.9 11/25/2015   Electrolytes Lab Results  Component Value Date   NA 138 11/25/2015   K 4.2 11/25/2015   CL 104 11/25/2015   CALCIUM 9.0 11/25/2015   MG 2.1 08/21/2015   Pain Modulating Vitamins No results found for: Maralyn Sago [id number], [id number], [id number], 25OHVITD1, 25OHVITD2, 25OHVITD3, VITAMINB12 Coagulation Parameters Lab Results  Component Value Date   PLT 238 11/25/2015   Cardiovascular Lab Results  Component Value Date   HGB 14.6 11/25/2015   HCT 43.5 11/25/2015   Note: Lab results reviewed.  Recent Diagnostic Imaging Review  Dg C-arm 1-60 Min-no Report  Result Date: 09/27/2016 There is no Radiologist interpretation  for this exam.  Note: Imaging results reviewed.          Meds  The patient has a current medication list which includes the following prescription(s): androgel pump, aspirin, oxycodone, oxycodone, pantoprazole, simvastatin, and cyclobenzaprine.  Current Outpatient Prescriptions on File Prior to Visit  Medication Sig  . ANDROGEL PUMP 20.25 MG/ACT (1.62%) GEL APPLY TO 2 PUMPS ONCE A DAY AS DIRECTED  . aspirin 81 MG tablet Take 81 mg by mouth daily.  Derrill Memo ON 12/04/2016] oxyCODONE (OXY IR/ROXICODONE) 5 MG immediate release tablet Take 1 tablet (5 mg total) by mouth every 8 (eight) hours as needed for severe pain.  Derrill Memo ON 11/04/2016] oxyCODONE (OXY IR/ROXICODONE) 5 MG immediate  release tablet Take 1 tablet (5 mg total) by mouth every 8  (eight) hours as needed for severe pain.  . pantoprazole (PROTONIX) 40 MG tablet Take 40 mg by mouth daily.  . simvastatin (ZOCOR) 10 MG tablet Take 10 mg by mouth at bedtime.   . cyclobenzaprine (FLEXERIL) 10 MG tablet Take 1 tablet (10 mg total) by mouth 3 (three) times daily as needed for muscle spasms.   No current facility-administered medications on file prior to visit.    ROS  Constitutional: Denies any fever or chills Gastrointestinal: No reported hemesis, hematochezia, vomiting, or acute GI distress Musculoskeletal: Denies any acute onset joint swelling, redness, loss of ROM, or weakness Neurological: No reported episodes of acute onset apraxia, aphasia, dysarthria, agnosia, amnesia, paralysis, loss of coordination, or loss of consciousness  Allergies  Mr. Pea has No Known Allergies.  PFSH  Drug: Mr. Epps  reports that he does not use drugs. Alcohol:  reports that he does not drink alcohol. Tobacco:  reports that he has never smoked. He has never used smokeless tobacco. Medical:  has a past medical history of Bilateral arm pain; Cardiomyopathy ([place]); Diverticulosis; Dupuytren's contracture; Foot pain, bilateral; GERD (gastroesophageal reflux disease); Hand weakness; Hyperlipidemia; Knee pain; Midline low back pain; Osteoarthritis; Pain in neck; Prostate cancer ([place]); Reactive airway disease; Seasonal allergies; Situational anxiety; and Sleep apnea. Family: family history includes Colon cancer in his brother; Hyperlipidemia in his sister; Lung cancer in his father; Prostate cancer in his brother.  Past Surgical History:  Procedure Laterality Date  . CHOLECYSTECTOMY N/A 12/02/2015   Procedure: LAPAROSCOPIC CHOLECYSTECTOMY;  Surgeon: Leonie Green, MD;  Location: [facility] ORS;  Service: General;  Laterality: N/A;  . COLONOSCOPY  10/24/2012  . ESOPHAGOGASTRODUODENOSCOPY (EGD) WITH PROPOFOL N/A 10/28/2015   Procedure: ESOPHAGOGASTRODUODENOSCOPY (EGD) WITH PROPOFOL;  Surgeon:  Lollie Sails, MD;  Location: [facility] ENDOSCOPY;  Service: Endoscopy;  Laterality: N/A;  . HAND SURGERY Bilateral   . HERNIA REPAIR    . MASS EXCISION Right 07/18/2015   Procedure: EXCISION MASS RIGHT HAND;  Surgeon: Leanor Kail, MD;  Location: [place];  Service: Orthopedics;  Laterality: Right;  CPAP  . NOSE SURGERY    . prostate seeding    . SKIN CANCER EXCISION Left 2015   shoulder  . TONSILLECTOMY  2009   Surgery for OSA (screw in place)  . UPPER GI ENDOSCOPY  12/18/2012   x3   Constitutional Exam  General appearance: Well nourished, well developed, and well hydrated. In no apparent acute distress Vitals:   11/01/16 1313  BP: (!) 141/98  Pulse: 78  Resp: 18  Temp: 97.9 F (36.6 C)  SpO2: 100%  Weight: 185 lb (83.9 kg)  Height: 5\' 9"  (1.753 m)   BMI Assessment: Estimated body mass index is 27.32 kg/m as calculated from the following:   Height as of this encounter: 5\' 9"  (1.753 m).   Weight as of this encounter: 185 lb (83.9 kg).  BMI interpretation table: BMI level Category Range association with higher incidence of chronic pain  <18 kg/m2 Underweight   18.5-24.9 kg/m2 Ideal body weight   25-29.9 kg/m2 Overweight Increased incidence by 20%  30-34.9 kg/m2 Obese (Class I) Increased incidence by 68%  35-39.9 kg/m2 Severe obesity (Class II) Increased incidence by 136%  >40 kg/m2 Extreme obesity (Class III) Increased incidence by 254%   BMI Readings from Last 4 Encounters:  11/01/16 27.32 kg/m  09/27/16 27.32 kg/m  08/30/16 27.32 kg/m  06/07/16 27.32 kg/m  Wt Readings from Last 4 Encounters:  11/01/16 185 lb (83.9 kg)  09/27/16 185 lb (83.9 kg)  08/30/16 185 lb (83.9 kg)  06/07/16 185 lb (83.9 kg)  Psych/Mental status: Alert, oriented x 3 (person, place, & time)       Eyes: PERLA Respiratory: No evidence of acute respiratory distress  Cervical Spine Exam  Inspection: No masses, redness, or swelling Alignment: Symmetrical Functional ROM:  Improved after treatment Stability: No instability detected Muscle strength & Tone: Functionally intact Sensory: Movement-associated discomfort Palpation: Complains of area being tender to palpation  Upper Extremity (UE) Exam    Side: Right upper extremity  Side: Left upper extremity  Inspection: No masses, redness, swelling, or asymmetry. No contractures  Inspection: No masses, redness, swelling, or asymmetry. No contractures  Functional ROM: Unrestricted ROM          Functional ROM: Unrestricted ROM          Muscle strength & Tone: Functionally intact  Muscle strength & Tone: Functionally intact  Sensory: Unimpaired  Sensory: Unimpaired  Palpation: Euthermic  Palpation: Euthermic  Specialized Test(s): Deferred         Specialized Test(s): Deferred          Thoracic Spine Exam  Inspection: No masses, redness, or swelling Alignment: Symmetrical Functional ROM: Unrestricted ROM Stability: No instability detected Sensory: Unimpaired Muscle strength & Tone: Functionally intact Palpation: Non-contributory  Lumbar Spine Exam  Inspection: No masses, redness, or swelling Alignment: Symmetrical Functional ROM: Decreased ROM Stability: No instability detected Muscle strength & Tone: Functionally intact Sensory: Unimpaired Palpation: Non-contributory Provocative Tests: Lumbar Hyperextension and rotation test: evaluation deferred today       Patrick's Maneuver: evaluation deferred today              Gait & Posture Assessment  Ambulation: Unassisted Gait: Relatively normal for age and body habitus Posture: WNL   Lower Extremity Exam    Side: Right lower extremity  Side: Left lower extremity  Inspection: No masses, redness, swelling, or asymmetry. No contractures  Inspection: No masses, redness, swelling, or asymmetry. No contractures  Functional ROM: Unrestricted ROM          Functional ROM: Unrestricted ROM          Muscle strength & Tone: Functionally intact  Muscle strength &  Tone: Functionally intact  Sensory: Unimpaired  Sensory: Unimpaired  Palpation: No palpable anomalies  Palpation: No palpable anomalies   Assessment  Primary Diagnosis & Pertinent Problem List: The primary encounter diagnosis was Chronic neck pain (neck<shoulder) (L>R). Diagnoses of Chronic pain of both upper extremities, Chronic upper back pain (Bilateral) (L>R), Osteoarthritis of spine with radiculopathy, cervical region, and 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) were also pertinent to this visit.  Status Diagnosis  Improving Improving Improving 1. Chronic neck pain (neck<shoulder) (L>R)   2. Chronic pain of both upper extremities   3. Chronic upper back pain (Bilateral) (L>R)   4. Osteoarthritis of spine with radiculopathy, cervical region   5. 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)      Plan of Care  Pharmacotherapy (Medications Ordered): No orders of the defined types were placed in this encounter.  New Prescriptions   No medications on file   Medications administered today: Mr. Masri had no medications administered during this visit. Lab-work, procedure(s), and/or referral(s): Orders Placed This Encounter  Procedures  . Cervical Epidural Injection   Imaging and/or referral(s): None  Interventional therapies: Planned, scheduled, and/or pending:   Left cervical epidural  steroid injection #2 under fluoroscopic guidance    Considering:   Left cervical epidural steroid injection #2 under fluoroscopic guidance  Left sided lumbar facet + sacroiliac joint radiofrequency ablation under fluoroscopic guidance and IV sedation.  Right-sided lumbar facet + sacroiliac joint radiofrequency ablation under fluoroscopic guidance and IV sedation. We will plan to do this too is to 6 weeks after the left side.  Diagnostic Bilateral Hip Block Possible Bilateral Hip RFA. Diagnostic bilateral cervical facet block. Possible bilateral cervical facet  RFA. Diagnostic Caudal ESI. Diagnostic Left CESI. Diagnostic Right Knee injection. Diagnostic right Genicular NB Possible right Genicular Nerve RFA. Diagnostic bilateral intra-articular shoulder inj. Diagnostic bilateral suprascapular NB. Possible bilateral suprascapular Nerve RFA.   Palliative PRN treatment(s):   Diagnostic Bilateral Lumbar Facet & Bilateral S-I joint block under fluoroscopy and IV sedation. Diagnostic Bilateral Hip Block Diagnostic bilateral cervical facet block. Diagnostic Caudal ESI. Diagnostic Left CESI. Diagnostic Right Knee injection. Diagnostic right Genicular NB Diagnostic bilateral intra-articular shoulder inj. Diagnostic bilateral suprascapular NB.   Provider-requested follow-up: Return for procedure (ASAP).  Future Appointments Date Time Provider [place]  11/08/2016 9:30 AM Milinda Pointer, MD [facility] None  12/09/2016 9:30 AM Milinda Pointer, MD [facility] None   Primary Care Physician: Sofie Hartigan, MD Location: [facility] Outpatient Pain Management Facility Note by: Kathlen Brunswick. Dossie Arbour, M.D, DABA, DABAPM, DABPM, DABIPP, FIPP Date: 11/01/2016; Time: 1:34 PM  Pain Score Disclaimer: We use the NRS-11 scale. This is a self-reported, subjective measurement of pain severity with only modest accuracy. It is used primarily to identify changes within a particular patient. It must be understood that outpatient pain scales are significantly less accurate that those used for research, where they can be applied under ideal controlled circumstances with minimal exposure to variables. In reality, the score is likely to be a combination of pain intensity and pain affect, where pain affect describes the degree of emotional arousal or changes in action readiness caused by the sensory experience of pain. Factors such as social and work situation, setting, emotional state, anxiety levels, expectation, and prior pain experience may influence pain perception and  show large inter-individual differences that may also be affected by time variables.  Patient instructions provided during this appointment: Patient Instructions   Preparing for your procedure (without sedation) Instructions: . Oral Intake: Do not eat or drink anything for at least 3 hours prior to your procedure. . Transportation: Unless otherwise stated by your physician, you may drive yourself after the procedure. . Blood Pressure Medicine: Take your blood pressure medicine with a sip of water the morning of the procedure. . Insulin: Take only  of your normal insulin dose. . Preventing infections: Shower with an antibacterial soap the morning of your procedure. . Build-up your immune system: Take 1000 mg of Vitamin C with every meal (3 times a day) the day prior to your procedure. . Pregnancy: If you are pregnant, call and cancel the procedure. . Sickness: If you have a cold, fever, or any active infections, call and cancel the procedure. . Arrival: You must be in the facility at least 30 minutes prior to your scheduled procedure. . Children: Do not bring any children with you. . Dress appropriately: Bring dark clothing that you would not mind if they get stained. . Valuables: Do not bring any jewelry or valuables. Procedure appointments are reserved for interventional treatments only. Marland Kitchen No Prescription Refills. . No medication changes will be discussed during procedure appointments. No disability issues will be discussed.Epidural Steroid Injection Patient  Information  Description: The epidural space surrounds the nerves as they exit the spinal cord.  In some patients, the nerves can be compressed and inflamed by a bulging disc or a tight spinal canal (spinal stenosis).  By injecting steroids into the epidural space, we can bring irritated nerves into direct contact with a potentially helpful medication.  These steroids act directly on the irritated nerves and can reduce swelling and  inflammation which often leads to decreased pain.  Epidural steroids may be injected anywhere along the spine and from the neck to the low back depending upon the location of your pain.   After numbing the skin with local anesthetic (like Novocaine), a small needle is passed into the epidural space slowly.  You may experience a sensation of pressure while this is being done.  The entire block usually last less than 10 minutes.  Conditions which may be treated by epidural steroids:   Low back and leg pain  Neck and arm pain  Spinal stenosis  Post-laminectomy syndrome  Herpes zoster (shingles) pain  Pain from compression fractures  Preparation for the injection:  1. Do not eat any solid food or dairy products within 8 hours of your appointment.  2. You may drink clear liquids up to 3 hours before appointment.  Clear liquids include water, black coffee, juice or soda.  No milk or cream please. 3. You may take your regular medication, including pain medications, with a sip of water before your appointment  Diabetics should hold regular insulin (if taken separately) and take 1/2 normal NPH dos the morning of the procedure.  Carry some sugar containing items with you to your appointment. 4. A driver must accompany you and be prepared to drive you home after your procedure.  5. Bring all your current medications with your. 6. An IV may be inserted and sedation may be given at the discretion of the physician.   7. A blood pressure cuff, EKG and other monitors will often be applied during the procedure.  Some patients may need to have extra oxygen administered for a short period. 8. You will be asked to provide medical information, including your allergies, prior to the procedure.  We must know immediately if you are taking blood thinners (like Coumadin/Warfarin)  Or if you are allergic to IV iodine contrast (dye). We must know if you could possible be pregnant.  Possible  side-effects:  Bleeding from needle site  Infection (rare, may require surgery)  Nerve injury (rare)  Numbness & tingling (temporary)  Difficulty urinating (rare, temporary)  Spinal headache ( a headache worse with upright posture)  Light -headedness (temporary)  Pain at injection site (several days)  Decreased blood pressure (temporary)  Weakness in arm/leg (temporary)  Pressure sensation in back/neck (temporary)  Call if you experience:  Fever/chills associated with headache or increased back/neck pain.  Headache worsened by an upright position.  New onset weakness or numbness of an extremity below the injection site  Hives or difficulty breathing (go to the emergency room)  Inflammation or drainage at the infection site  Severe back/neck pain  Any new symptoms which are concerning to you  Please note:  Although the local anesthetic injected can often make your back or neck feel good for several hours after the injection, the pain will likely return.  It takes 3-7 days for steroids to work in the epidural space.  You may not notice any pain relief for at least that one week.  If effective, we  will often do a series of three injections spaced 3-6 weeks apart to maximally decrease your pain.  After the initial series, we generally will wait several months before considering a repeat injection of the same type.  If you have any questions, please call [phone] [facility]  Pain Score  Introduction: The pain score used by this practice is the Verbal Numerical Rating Scale (VNRS-11). This is an 11-point scale. It is for adults and children 10 years or older. There are significant differences in how the pain score is reported, used, and applied. Forget everything you learned in the past and learn this scoring system.  General Information: The scale should reflect your current level of pain. Unless you are specifically asked for  the level of your worst pain, or your average pain. If you are asked for one of these two, then it should be understood that it is over the past 24 hours.  Basic Activities of Daily Living (ADL): Personal hygiene, dressing, eating, transferring, and using restroom.  Instructions: Most patients tend to report their level of pain as a combination of two factors, their physical pain and their psychosocial pain. This last one is also known as "suffering" and it is reflection of how physical pain affects you socially and psychologically. From now on, report them separately. From this point on, when asked to report your pain level, report only your physical pain. Use the following table for reference.  Pain Clinic Pain Levels (0-5/10)  Pain Level Score Description  No Pain 0   Mild pain 1 Nagging, annoying, but does not interfere with basic activities of daily living (ADL). Patients are able to eat, bathe, get dressed, toileting (being able to get on and off the toilet and perform personal hygiene functions), transfer (move in and out of bed or a chair without assistance), and maintain continence (able to control bladder and bowel functions). Blood pressure and heart rate are unaffected. A normal heart rate for a healthy adult ranges from 60 to 100 bpm (beats per minute).   Mild to moderate pain 2 Noticeable and distracting. Impossible to hide from other people. More frequent flare-ups. Still possible to adapt and function close to normal. It can be very annoying and may have occasional stronger flare-ups. With discipline, patients may get used to it and adapt.   Moderate pain 3 Interferes significantly with activities of daily living (ADL). It becomes difficult to feed, bathe, get dressed, get on and off the toilet or to perform personal hygiene functions. Difficult to get in and out of bed or a chair without assistance. Very distracting. With effort, it can be ignored when deeply involved in activities.    Moderately severe pain 4 Impossible to ignore for more than a few minutes. With effort, patients may still be able to manage work or participate in some social activities. Very difficult to concentrate. Signs of autonomic nervous system discharge are evident: dilated pupils (mydriasis); mild sweating (diaphoresis); sleep interference. Heart rate becomes elevated (>115 bpm). Diastolic blood pressure (lower number) rises above 100 mmHg. Patients find relief in laying down and not moving.   Severe pain 5 Intense and extremely unpleasant. Associated with frowning face and frequent crying. Pain overwhelms the senses.  Ability to do any activity or maintain social relationships becomes significantly limited. Conversation becomes difficult. Pacing back and forth is common, as getting into a comfortable position is nearly impossible. Pain wakes you up from deep sleep. Physical signs will be  obvious: pupillary dilation; increased sweating; goosebumps; brisk reflexes; cold, clammy hands and feet; nausea, vomiting or dry heaves; loss of appetite; significant sleep disturbance with inability to fall asleep or to remain asleep. When persistent, significant weight loss is observed due to the complete loss of appetite and sleep deprivation.  Blood pressure and heart rate becomes significantly elevated. Caution: If elevated blood pressure triggers a pounding headache associated with blurred vision, then the patient should immediately seek attention at an urgent or emergency care unit, as these may be signs of an impending stroke.    Emergency Department Pain Levels (6-10/10)  Emergency Room Pain 6 Severely limiting. Requires emergency care and should not be seen or managed at an outpatient pain management facility. Communication becomes difficult and requires great effort. Assistance to reach the emergency department may be required. Facial flushing and profuse sweating along with potentially dangerous increases in heart  rate and blood pressure will be evident.   Distressing pain 7 Self-care is very difficult. Assistance is required to transport, or use restroom. Assistance to reach the emergency department will be required. Tasks requiring coordination, such as bathing and getting dressed become very difficult.   Disabling pain 8 Self-care is no longer possible. At this level, pain is disabling. The individual is unable to do even the most "basic" activities such as walking, eating, bathing, dressing, transferring to a bed, or toileting. Fine motor skills are lost. It is difficult to think clearly.   Incapacitating pain 9 Pain becomes incapacitating. Thought processing is no longer possible. Difficult to remember your own name. Control of movement and coordination are lost.   The worst pain imaginable 10 At this level, most patients pass out from pain. When this level is reached, collapse of the autonomic nervous system occurs, leading to a sudden drop in blood pressure and heart rate. This in turn results in a temporary and dramatic drop in blood flow to the brain, leading to a loss of consciousness. Fainting is one of the body's self defense mechanisms. Passing out puts the brain in a calmed state and causes it to shut down for a while, in order to begin the healing process.    Summary: 1. Refer to this scale when providing [place] with your pain level. 2. Be accurate and careful when reporting your pain level. This will help with your care. 3. Over-reporting your pain level will lead to loss of credibility. 4. Even a level of 1/10 means that there is pain and will be treated at our facility. 5. High, inaccurate reporting will be documented as "Symptom Exaggeration", leading to loss of credibility and suspicions of possible secondary gains such as obtaining more narcotics, or wanting to appear disabled, for fraudulent reasons. 6. Only pain levels of 5 or below will be seen at our facility. 7. Pain levels of 6 and  above will be sent to the Emergency Department and the appointment cancelled. _____________________________________________________________________________________________

## 2016-11-08 ENCOUNTER — Ambulatory Visit: Payer: BLUE CROSS/BLUE SHIELD | Attending: Pain Medicine | Admitting: Pain Medicine

## 2016-11-08 ENCOUNTER — Encounter: Payer: Self-pay | Admitting: Pain Medicine

## 2016-11-08 ENCOUNTER — Ambulatory Visit
Admission: RE | Admit: 2016-11-08 | Discharge: 2016-11-08 | Disposition: A | Discharge status: Home / Self Care | Payer: BLUE CROSS/BLUE SHIELD | Source: Ambulatory Visit | Attending: Pain Medicine | Admitting: Pain Medicine

## 2016-11-08 ENCOUNTER — Other Ambulatory Visit: Payer: Self-pay | Admitting: Pain Medicine

## 2016-11-08 VITALS — BP 131/98 | HR 82 | Temp 98.0°F | Resp 16 | Ht 69.0 in | Wt 185.0 lb

## 2016-11-08 DIAGNOSIS — G8929 Other chronic pain: Secondary | ICD-10-CM | POA: Insufficient documentation

## 2016-11-08 DIAGNOSIS — M549 Dorsalgia, unspecified: Secondary | ICD-10-CM | POA: Diagnosis present

## 2016-11-08 DIAGNOSIS — R52 Pain, unspecified: Secondary | ICD-10-CM

## 2016-11-08 DIAGNOSIS — Z9889 Other specified postprocedural states: Secondary | ICD-10-CM | POA: Insufficient documentation

## 2016-11-08 DIAGNOSIS — M542 Cervicalgia: Secondary | ICD-10-CM | POA: Insufficient documentation

## 2016-11-08 DIAGNOSIS — M4802 Spinal stenosis, cervical region: Secondary | ICD-10-CM | POA: Diagnosis not present

## 2016-11-08 DIAGNOSIS — M4722 Other spondylosis with radiculopathy, cervical region: Secondary | ICD-10-CM | POA: Diagnosis not present

## 2016-11-08 DIAGNOSIS — M9981 Other biomechanical lesions of cervical region: Secondary | ICD-10-CM

## 2016-11-08 MED ORDER — SODIUM CHLORIDE 0.9 % IJ SOLN
INTRAMUSCULAR | Status: AC
  Filled 2016-11-08: qty 10

## 2016-11-08 MED ORDER — LIDOCAINE HCL (PF) 1 % IJ SOLN
10.0000 mL | Freq: Once | INTRAMUSCULAR | Status: AC
  Administered 2016-11-08: 5 mL
  Filled 2016-11-08: qty 10

## 2016-11-08 MED ORDER — ROPIVACAINE HCL 5 MG/ML IJ SOLN
0.5000 mL | Freq: Once | INTRAMUSCULAR | Status: AC
  Administered 2016-11-08: 20 mL via EPIDURAL
  Filled 2016-11-08: qty 20

## 2016-11-08 MED ORDER — IOPAMIDOL (ISOVUE-M 200) INJECTION 41%
10.0000 mL | Freq: Once | INTRAMUSCULAR | Status: AC
  Administered 2016-11-08: 10 mL via EPIDURAL
  Filled 2016-11-08: qty 10

## 2016-11-08 MED ORDER — SODIUM CHLORIDE 0.9% FLUSH: 1.0000 mL | Freq: Once | INTRAVENOUS | Status: DC

## 2016-11-08 MED ORDER — DEXAMETHASONE SODIUM PHOSPHATE 4 MG/ML IJ SOLN
10.0000 mg | Freq: Once | INTRAMUSCULAR | Status: AC
  Administered 2016-11-08: 10 mg
  Filled 2016-11-08: qty 3

## 2016-11-08 NOTE — Patient Instructions (Addendum)
Post-Procedure Pain Diary   Name: Date of Service Procedure  11/08/2016 [facility]  WITH Pain Management Discharge Instructions  General Discharge Instructions :  If you need to reach your doctor call: Monday-Friday 8:00 am - 4:00 pm at [phone] or toll free [phone].  After clinic hours [phone] to have operator reach doctor.  Bring all of your medication bottles to all your appointments in the pain clinic.  To cancel or reschedule your appointment with Pain Management please remember to call 24 hours in advance to avoid a fee.  Refer to the educational materials which you have been given on: General Risks, I had my Procedure. Discharge Instructions, Post Sedation.  Post Procedure Instructions:  The drugs you were given will stay in your system until tomorrow, so for the next 24 hours you should not drive, make any legal decisions or drink any alcoholic beverages.  You may eat anything you prefer, but it is better to start with liquids then soups and crackers, and gradually work up to solid foods.  Please notify your doctor immediately if you have any unusual bleeding, trouble breathing or pain that is not related to your normal pain.  Depending on the type of procedure that was done, some parts of your body may feel week and/or numb.  This usually clears up by tonight or the next day.  Walk with the use of an assistive device or accompanied by an adult for the 24 hours.  You may use ice on the affected area for the first 24 hours.  Put ice in a Ziploc bag and cover with a towel and place against area 15 minutes on 15 minutes off.  You may switch to heat after 24 hours. Epidural Steroid Injection An epidural steroid injection is a shot of steroid medicine and numbing medicine that is given into the space between the spinal cord and the bones in your back (epidural space). The shot helps relieve pain caused by an irritated or swollen nerve root. The amount of pain relief you  get from the injection depends on what is causing the nerve to be swollen and irritated, and how long your pain lasts. You are more likely to benefit from this injection if your pain is strong and comes on suddenly rather than if you have had pain for a long time. Tell a health care provider about:  Any allergies you have.  All medicines you are taking, including vitamins, herbs, eye drops, creams, and over-the-counter medicines.  Any problems you or family members have had with anesthetic medicines.  Any blood disorders you have.  Any surgeries you have had.  Any medical conditions you have.  Whether you are pregnant or may be pregnant. What are the risks? Generally, this is a safe procedure. However, problems may occur, including:  Headache.  Bleeding.  Infection.  Allergic reaction to medicines.  Damage to your nerves. What happens before the procedure? Staying hydrated  Follow instructions from your health care provider about hydration, which may include:  Up to 2 hours before the procedure - you may continue to drink clear liquids, such as water, clear fruit juice, black coffee, and plain tea. Eating and drinking restrictions  Follow instructions from your health care provider about eating and drinking, which may include:  8 hours before the procedure - stop eating heavy meals or foods such as meat, fried foods, or fatty foods.  6 hours before the procedure - stop eating light meals or foods, such as toast or cereal.  6 hours before the procedure - stop drinking milk or drinks that contain milk.  2 hours before the procedure - stop drinking clear liquids. Medicine  You may be given medicines to lower anxiety.  Ask your health care provider about:  Changing or stopping your regular medicines. This is especially important if you are taking diabetes medicines or blood thinners.  Taking medicines such as aspirin and ibuprofen. These medicines can thin your blood.  Do not take these medicines before your procedure if your health care provider instructs you not to. General instructions  Plan to have someone take you home from the hospital or clinic. What happens during the procedure?  You may receive a medicine to help you relax (sedative).  You will be asked to lie on your abdomen.  The injection site will be cleaned.  A numbing medicine (local anesthetic) will be used to numb the injection site.  A needle will be inserted through your skin into the epidural space. You may feel some discomfort when this happens. An X-ray machine will be used to make sure the needle is put as close as possible to the affected nerve.  A steroid medicine and a local anesthetic will be injected into the epidural space.  The needle will be removed.  A bandage (dressing) will be put over the injection site. What happens after the procedure?  Your blood pressure, heart rate, breathing rate, and blood oxygen level will be monitored until the medicines you were given have worn off.  Your arm or leg may feel weak or numb for a few hours.  The injection site may feel sore.  Do not drive for 24 hours if you received a sedative. This information is not intended to replace advice given to you by your health care provider. Make sure you discuss any questions you have with your health care provider. Document Released: 12/07/2007 Document Revised: 02/11/2016 Document Reviewed: 12/16/2015 [employer]vier Interactive Patient Education  2017 [place]   Time Period Pain Score Painful Area  Pre-procedure  _5__/10   Time Period Pain Score Area improved. Area not improved.  15 to 30 min post-procedure ____/10    1st hour after procedure ____/10    2nd hour after procedure ____/10    3rd hour after procedure ____/10    4th hour after procedure ____/10    5th hour after procedure ____/10    6th hour after procedure ____/10    7th hour after procedure ____/10     Time Period Pain Score Area improved. Area not improved.  Note: From here on, always document your pain score 1st thing in the morning.  1st day after procedure ____/10    2nd day after procedure ____/10    3rd day after procedure ____/10    4th day after procedure ____/10    5th day after procedure ____/10    Time Period Pain Score Area improved. Area not improved.  10th day after procedure ____/10    20th day after procedure ____/10     Benefits Indicate for each set of activities if the procedure changes your ability to accomplish them.  Activity Worse No-Change Improved  Dressing, eating, walking, toileting, hygiene     Shopping, housekeeping, food preparation, community transportation     Range of motion of affected area      Your opinion Please indicate which statement best describes your impression of this treatment.  Statement (X)  Based on the results, I am encouraged.  Based on the results, I am disappointed.   I am not sure I have an opinion at this point.    Note: Make sure to complete and return this form to your physician, on your follow-up appointment. This information will be used to interpret the results. Failure to accurately complete, or to return this information, may result in less than optimal outcomes. Post-procedure Information What to expect: Most procedures involve the use of a local anesthetic (numbing medicine), and a steroid (anti-inflammatory medicine).  The local anesthetics may cause temporary numbness and weakness of the legs or arms, depending on the location of the block. This numbness/weakness may last 4-6 hours, depending on the local anesthetic used. In rare instances, it can last up to 24 hours. While numb, you must be very careful not to injure the extremity.  After any procedure, you could expect the pain to get better within 15-20 minutes. This relief is temporary and may last 4-6 hours. Once the local anesthetics wears off, you could  experience discomfort, possibly more than usual, for up to 10 (ten) days. In the case of radiofrequencies, it may last up to 6 weeks. Surgeries may take up to 8 weeks for the healing process. The discomfort is due to the irritation caused by needles going through skin and muscle. To minimize the discomfort, we recommend using ice the first day, and heat from then on. The ice should be applied for 15 minutes on, and 15 minutes off. Keep repeating this cycle until bedtime. Avoid applying the ice directly to the skin, to prevent frostbite. Heat should be used daily, until the pain improves (4-10 days). Be careful not to burn yourself.  Occasionally you may experience muscle spasms or cramps. These occur as a consequence of the irritation caused by the needle sticks to the muscle and the blood that will inevitably be lost into the surrounding muscle tissue. Blood tends to be very irritating to tissues, which tend to react by going into spasm. These spasms may start the same day of your procedure, but they may also take days to develop. This late onset type of spasm or cramp is usually caused by electrolyte imbalances triggered by the steroids, at the level of the kidney. Cramps and spasms tend to respond well to muscle relaxants, multivitamins (some are triggered by the procedure, but may have their origins in vitamin deficiencies), and "Gatorade", or any sports drinks that can replenish any electrolyte imbalances. (If you are a diabetic, ask your pharmacist to get you a sugar-free brand.) Warm showers or baths may also be helpful. Stretching exercises are highly recommended. General Instructions:  Be alert for signs of possible infection: redness, swelling, heat, red streaks, elevated temperature, and/or fever. These typically appear 4 to 6 days after the procedure. Immediately notify your doctor if you experience unusual bleeding, difficulty breathing, or loss of bowel or bladder control. If you experience  increased pain, do not increase your pain medicine intake, unless instructed by your pain physician. Post-Procedure Care:  Be careful in moving about. Muscle spasms in the area of the injection may occur. Applying ice or heat to the area is often helpful. The incidence of spinal headaches after epidural injections ranges between 1.4% and 6%. If you develop a headache that does not seem to respond to conservative therapy, please let your physician know. This can be treated with an epidural blood patch.   Post-procedure numbness or redness is to be expected, however it should average 4 to 6 hours.  If numbness and weakness of your extremities begins to develop 4 to 6 hours after your procedure, and is felt to be progressing and worsening, immediately contact your physician.   Diet:  If you experience nausea, do not eat until this sensation goes away. If you had a "Stellate Ganglion Block" for upper extremity "Reflex Sympathetic Dystrophy", do not eat or drink until your hoarseness goes away. In any case, always start with liquids first and if you tolerate them well, then slowly progress to more solid foods. Activity:  For the first 4 to 6 hours after the procedure, use caution in moving about as you may experience numbness and/or weakness. Use caution in cooking, using household electrical appliances, and climbing steps. If you need to reach your Doctor call our office: [phone]) [phone] Monday-Thursday 8:00 am - 4:00 PM    Fridays: Closed     In case of an emergency: In case of emergency, call 911 or go to the nearest emergency room and have the physician there call us.  Interpretation of Procedure Every nerve block has two components: a diagnostic component, and a treatment component. Unrealistic expectations are the most common causes of "perceived failure".  In a perfect world, a single nerve block should be able to completely and permanently eliminate the pain. Sadly, the world is not perfect.  Most  pain management nerve blocks are performed using local anesthetics and steroids. Steroids are responsible for any long-term benefit that you may experience. Their purpose is to decrease any chronic swelling that may exist in the area. Steroids begin to work immediately after being injected. However, most patients will not experience any benefits until 5 to 10 days after the injection, when the swelling has come down to the point where they can tell a difference. Steroids will only help if there is swelling to be treated. As such, they can assist with the diagnosis. If effective, they suggest an inflammatory component to the pain, and if ineffective, they rule out inflammation as the main cause or component of the problem. If the problem is one of mechanical compression, you will get no benefit from those steroids.   In the case of local anesthetics, they have a crucial role in the diagnosis of your condition. Most will begin to work within15 to 20 minutes after injection. The duration will depend on the type used (short- vs. Long-acting). It is of outmost importance that patients keep tract of their pain, after the procedure. To assist with this matter, a "Post-procedure Pain Diary" is provided. Make sure to complete it and to bring it back to your follow-up appointment.  As long as the patient keeps accurate, detailed records of their symptoms after every procedure, and returns to have those interpreted, every procedure will provide [place] with invaluable information. Even a block that does not provide the patient with any relief, will always provide [place] with information about the mechanism and the origin of the pain. The only time a nerve block can be considered a waste of time is when patients do not keep track of the results, or do not keep their post-procedure appointment.  Reporting the results back to your physician The Pain Score  Pain is a subjective complaint. It cannot be seen, touched, or measured. We  depend entirely on the patient's report of the pain in order to assess your condition and treatment. To evaluate the pain, we use a pain scale, where "0" means "No Pain", and a "10" is "the worst possible pain  that you can even imagine" (i.e. something like been eaten alive by a shark or being torn apart by a lion).   You will frequently be asked to rate your pain. Please be as accurate, remember that medical decisions will be based on your responses. Please do not rate your pain above a 10. Doing so is actually interpreted as "symptom magnification" (exaggeration), as well as lack of understanding with regards to the scale. To put this into perspective, when you tell us that your pain is at a 10 (ten), what you are saying is that there is nothing we can do to make this pain any worse. (Carefully think about that.)

## 2016-11-08 NOTE — Progress Notes (Signed)
Safety precautions to be maintained throughout the outpatient stay will include: orient to surroundings, keep bed in low position, maintain call bell within reach at all times, provide assistance with transfer out of bed and ambulation.  

## 2016-11-08 NOTE — Progress Notes (Signed)
Patient's Name: Ricky Mercer  MRN: [id number]  Referring Provider: Milinda Pointer, MD  DOB: [birth year]  PCP: Sofie Hartigan, MD  DOS: 11/08/2016  Note by: Kathlen Brunswick. Dossie Arbour, MD  Service setting: Ambulatory outpatient  Location: [facility] (AMB) Pain Management Facility  Visit type: Procedure  Specialty: Interventional Pain Management  Patient type: Established   Primary Reason for Visit: Interventional Pain Management Treatment. CC: Pain (left scapula)  Procedure:  Anesthesia, Analgesia, Anxiolysis:  Type: Therapeutic, Inter-Laminar, Epidural Steroid Injection Region: Posterior Cervico-thoracic Region Level: C7-T1 Laterality: Left-Sided Paramedial  Type: Local Anesthesia Local Anesthetic: Lidocaine 1% Route: Infiltration ([place]/IM) IV Access: Declined Sedation: Declined  Indication(s): Analgesia          Indications: 1. Osteoarthritis of spine with radiculopathy, cervical region   2. 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 Chronic neck pain (neck<shoulder) (L>R)    Pain Score: Pre-procedure: 6 /10 Post-procedure: 0-No pain/10  Pre-op Assessment:  Previous date of service: 11/01/16 Service provided: Evaluation Ricky Mercer is a 66 y.o. (year old), male patient, seen today for interventional treatment. He  has a past surgical history that includes Nose surgery; Hand surgery (Bilateral); Colonoscopy (10/24/2012); Upper gi endoscopy (12/18/2012); Tonsillectomy (2009); Skin cancer excision (Left, 2015); Mass excision (Right, 07/18/2015); Esophagogastroduodenoscopy (egd) with propofol (N/A, 10/28/2015); prostate seeding; Hernia repair; and Cholecystectomy (N/A, 12/02/2015). His primarily concern today is the Pain (left scapula)  Initial Vital Signs: Blood pressure 133/76, pulse 81, temperature 98 F (36.7 C), resp. rate 18, height 5\' 9"  (1.753 m), weight 185 lb (83.9 kg), SpO2 98 %. BMI: 27.32 kg/m  Risk Assessment: Allergies: Reviewed. He has No Known Allergies.    Allergy Precautions: None required Coagulopathies: "Reviewed. None identified.  Blood-thinner therapy: None at this time Active Infection(s): Reviewed. None identified. Ricky Mercer is afebrile  Site Confirmation: Ricky Mercer was asked to confirm the procedure and laterality before marking the site Procedure checklist: Completed Consent: Before the procedure and under the influence of no sedative(s), amnesic(s), or anxiolytics, the patient was informed of the treatment options, risks and possible complications. To fulfill our ethical and legal obligations, as recommended by the American Medical Association's Code of Ethics, I have informed the patient of my clinical impression; the nature and purpose of the treatment or procedure; the risks, benefits, and possible complications of the intervention; the alternatives, including doing nothing; the risk(s) and benefit(s) of the alternative treatment(s) or procedure(s); and the risk(s) and benefit(s) of doing nothing. The patient was provided information about the general risks and possible complications associated with the procedure. These may include, but are not limited to: failure to achieve desired goals, infection, bleeding, organ or nerve damage, allergic reactions, paralysis, and death. In addition, the patient was informed of those risks and complications associated to Spine-related procedures, such as failure to decrease pain; infection (i.e.: Meningitis, epidural or intraspinal abscess); bleeding (i.e.: epidural hematoma, subarachnoid hemorrhage, or any other type of intraspinal or peri-dural bleeding); organ or nerve damage (i.e.: Any type of peripheral nerve, nerve root, or spinal cord injury) with subsequent damage to sensory, motor, and/or autonomic systems, resulting in permanent pain, numbness, and/or weakness of one or several areas of the body; allergic reactions; (i.e.: anaphylactic reaction); and/or death. Furthermore, the patient was  informed of those risks and complications associated with the medications. These include, but are not limited to: allergic reactions (i.e.: anaphylactic or anaphylactoid reaction(s)); adrenal axis suppression; blood sugar elevation that in diabetics may result in ketoacidosis or comma; water retention  that in patients with history of congestive heart failure may result in shortness of breath, pulmonary edema, and decompensation with resultant heart failure; weight gain; swelling or edema; medication-induced neural toxicity; particulate matter embolism and blood vessel occlusion with resultant organ, and/or nervous system infarction; and/or aseptic necrosis of one or more joints. Finally, the patient was informed that Medicine is not an exact science; therefore, there is also the possibility of unforeseen or unpredictable risks and/or possible complications that may result in a catastrophic outcome. The patient indicated having understood very clearly. We have given the patient no guarantees and we have made no promises. Enough time was given to the patient to ask questions, all of which were answered to the patient's satisfaction. Ricky Mercer has indicated that he wanted to continue with the procedure. Attestation: I, the ordering provider, attest that I have discussed with the patient the benefits, risks, side-effects, alternatives, likelihood of achieving goals, and potential problems during recovery for the procedure that I have provided informed consent. Date: 11/08/2016; Time: 9:38 AM  Pre-Procedure Preparation:  Monitoring: As per clinic protocol. Respiration, ETCO2, SpO2, BP, heart rate and rhythm monitor placed and checked for adequate function Safety Precautions: Patient was assessed for positional comfort and pressure points before starting the procedure. Time-out: I initiated and conducted the "Time-out" before starting the procedure, as per protocol. The patient was asked to participate by  confirming the accuracy of the "Time Out" information. Verification of the correct person, site, and procedure were performed and confirmed by me, the nursing staff, and the patient. "Time-out" conducted as per Joint Commission's Universal Protocol (UP.01.01.01). "Time-out" Date & Time: 11/08/2016; 1102 hrs.  Description of Procedure Process:   Position: Prone with head of the table was raised to facilitate breathing. Target Area: For Epidural Steroid injections the target is the interlaminar space, initially targeting the lower border of the superior vertebral body lamina. Approach: Paramedial approach. Area Prepped: Entire PosteriorCervical Region Prepping solution: ChloraPrep (2% chlorhexidine gluconate and 70% isopropyl alcohol) Safety Precautions: Aspiration looking for blood return was conducted prior to all injections. At no point did we inject any substances, as a needle was being advanced. No attempts were made at seeking any paresthesias. Safe injection practices and needle disposal techniques used. Medications properly checked for expiration dates. SDV (single dose vial) medications used. Description of the Procedure: Protocol guidelines were followed. The procedure needle was introduced through the skin, ipsilateral to the reported pain, and advanced to the target area. Bone was contacted and the needle walked caudad, until the lamina was cleared. The epidural space was identified using "loss-of-resistance technique" with 2-3 ml of PF-NaCl (0.9% NSS), in a 5cc LOR glass syringe. Vitals:   11/08/16 0931 11/08/16 1106 11/08/16 1110 11/08/16 1114  BP: 133/76 (!) 134/96 (!) 134/99 (!) 131/98  Pulse: 81 84 82   Resp: 18 15 16    Temp: 98 F (36.7 C)     SpO2: 98% 99% 99%   Weight: 185 lb (83.9 kg)     Height: 5\' 9"  (1.753 m)       Start Time: 1103 hrs. End Time: 1109 hrs. Materials:  Needle(s) Type: Epidural needle Gauge: 17G Length: 3.5-in Medication(s): We administered  dexamethasone, iopamidol, lidocaine (PF), and ropivacaine (PF) 5 mg/mL (0.5%). Please see chart orders for dosing details.  Imaging Guidance (Spinal):  Type of Imaging Technique: Fluoroscopy Guidance (Spinal) Indication(s): Assistance in needle guidance and placement for procedures requiring needle placement in or near specific anatomical locations not easily accessible without  such assistance. Exposure Time: Please see nurses notes. Contrast: Before injecting any contrast, we confirmed that the patient did not have an allergy to iodine, shellfish, or radiological contrast. Once satisfactory needle placement was completed at the desired level, radiological contrast was injected. Contrast injected under live fluoroscopy. No contrast complications. See chart for type and volume of contrast used. Fluoroscopic Guidance: I was personally present during the use of fluoroscopy. "Tunnel Vision Technique" used to obtain the best possible view of the target area. Parallax error corrected before commencing the procedure. "Direction-depth-direction" technique used to introduce the needle under continuous pulsed fluoroscopy. Once target was reached, antero-posterior, oblique, and lateral fluoroscopic projection used confirm needle placement in all planes. Images permanently stored in EMR. Interpretation: I personally interpreted the imaging intraoperatively. Adequate needle placement confirmed in multiple planes. Appropriate spread of contrast into desired area was observed. No evidence of afferent or efferent intravascular uptake. No intrathecal or subarachnoid spread observed. Permanent images saved into the patient's record.  Antibiotic Prophylaxis:  Indication(s): None identified Antibiotic given: None  Post-operative Assessment:  EBL: None Complications: No immediate post-treatment complications observed by team, or reported by patient. Note: The patient tolerated the entire procedure well. A repeat set of  vitals were taken after the procedure and the patient was kept under observation following institutional policy, for this type of procedure. Post-procedural neurological assessment was performed, showing return to baseline, prior to discharge. The patient was provided with post-procedure discharge instructions, including a section on how to identify potential problems. Should any problems arise concerning this procedure, the patient was given instructions to immediately contact us, at any time, without hesitation. In any case, we plan to contact the patient by telephone for a follow-up status report regarding this interventional procedure. Comments:  No additional relevant information.  Plan of Care  Disposition: Discharge home  Discharge Date & Time: 11/08/2016; 1120 hrs.  Physician-requested Follow-up:  Return in about 2 weeks (around 11/22/2016).  Future Appointments Date Time Provider [place]  12/09/2016 9:30 AM Milinda Pointer, MD [facility] None   Medications ordered for procedure: Meds ordered this encounter  Medications  . dexamethasone (DECADRON) injection 10 mg  . iopamidol (ISOVUE-M) 41 % intrathecal injection 10 mL  . lidocaine (PF) (XYLOCAINE) 1 % injection 10 mL  . sodium chloride flush (NS) 0.9 % injection 1 mL  . ropivacaine (PF) 5 mg/mL (0.5%) (NAROPIN) injection 0.5 mL   Medications administered: We administered dexamethasone, iopamidol, lidocaine (PF), and ropivacaine (PF) 5 mg/mL (0.5%).  See the medical record for exact dosing, route, and time of administration.  Lab-work, Procedure(s), & Referral(s) Ordered: Orders Placed This Encounter  Procedures  . Cervical Epidural Injection  . Discharge instructions  . Follow-up  . Informed Consent Details: Transcribe to consent form and obtain patient signature  . Provider attestation of informed consent for procedure/surgical case  . Verify informed consent   Imaging Ordered: Results for orders placed in visit  on 09/27/16  DG C-Arm 1-60 Min-No Report   Narrative There is no Radiologist interpretation  for this exam.   New Prescriptions   No medications on file   Primary Care Physician: Sofie Hartigan, MD Location: [facility] Note by: Kathlen Brunswick. Dossie Arbour, M.D, DABA, DABAPM, DABPM, DABIPP, FIPP Date: 11/08/2016; Time: 11:50 AM  Disclaimer:  Medicine is not an exact science. The only guarantee in medicine is that nothing is guaranteed. It is important to note that the decision to proceed with this intervention was based on  the information collected from the patient. The Data and conclusions were drawn from the patient's questionnaire, the interview, and the physical examination. Because the information was provided in large part by the patient, it cannot be guaranteed that it has not been purposely or unconsciously manipulated. Every effort has been made to obtain as much relevant data as possible for this evaluation. It is important to note that the conclusions that lead to this procedure are derived in large part from the available data. Always take into account that the treatment will also be dependent on availability of resources and existing treatment guidelines, considered by other Pain Management Practitioners as being common knowledge and practice, at the time of the intervention. For Medico-Legal purposes, it is also important to point out that variation in procedural techniques and pharmacological choices are the acceptable norm. The indications, contraindications, technique, and results of the above procedure should only be interpreted and judged by a Board-Certified Interventional Pain Specialist with extensive familiarity and expertise in the same exact procedure and technique. Attempts at providing opinions without similar or greater experience and expertise than that of the treating physician will be considered as inappropriate and unethical, and shall result in a  formal complaint to the state medical board and applicable specialty societies.  Instructions provided at this appointment: Patient Instructions    Post-Procedure Pain Diary   Name: Date of Service Procedure  11/08/2016 [facility]  WITH Pain Management Discharge Instructions  General Discharge Instructions :  If you need to reach your doctor call: Monday-Friday 8:00 am - 4:00 pm at [phone] or toll free [phone].  After clinic hours [phone] to have operator reach doctor.  Bring all of your medication bottles to all your appointments in the pain clinic.  To cancel or reschedule your appointment with Pain Management please remember to call 24 hours in advance to avoid a fee.  Refer to the educational materials which you have been given on: General Risks, I had my Procedure. Discharge Instructions, Post Sedation.  Post Procedure Instructions:  The drugs you were given will stay in your system until tomorrow, so for the next 24 hours you should not drive, make any legal decisions or drink any alcoholic beverages.  You may eat anything you prefer, but it is better to start with liquids then soups and crackers, and gradually work up to solid foods.  Please notify your doctor immediately if you have any unusual bleeding, trouble breathing or pain that is not related to your normal pain.  Depending on the type of procedure that was done, some parts of your body may feel week and/or numb.  This usually clears up by tonight or the next day.  Walk with the use of an assistive device or accompanied by an adult for the 24 hours.  You may use ice on the affected area for the first 24 hours.  Put ice in a Ziploc bag and cover with a towel and place against area 15 minutes on 15 minutes off.  You may switch to heat after 24 hours. Epidural Steroid Injection An epidural steroid injection is a shot of steroid medicine and numbing medicine that is given into the space between the spinal cord  and the bones in your back (epidural space). The shot helps relieve pain caused by an irritated or swollen nerve root. The amount of pain relief you get from the injection depends on what is causing the nerve to be swollen and irritated, and how long your pain lasts. You  are more likely to benefit from this injection if your pain is strong and comes on suddenly rather than if you have had pain for a long time. Tell a health care provider about:  Any allergies you have.  All medicines you are taking, including vitamins, herbs, eye drops, creams, and over-the-counter medicines.  Any problems you or family members have had with anesthetic medicines.  Any blood disorders you have.  Any surgeries you have had.  Any medical conditions you have.  Whether you are pregnant or may be pregnant. What are the risks? Generally, this is a safe procedure. However, problems may occur, including:  Headache.  Bleeding.  Infection.  Allergic reaction to medicines.  Damage to your nerves. What happens before the procedure? Staying hydrated  Follow instructions from your health care provider about hydration, which may include:  Up to 2 hours before the procedure - you may continue to drink clear liquids, such as water, clear fruit juice, black coffee, and plain tea. Eating and drinking restrictions  Follow instructions from your health care provider about eating and drinking, which may include:  8 hours before the procedure - stop eating heavy meals or foods such as meat, fried foods, or fatty foods.  6 hours before the procedure - stop eating light meals or foods, such as toast or cereal.  6 hours before the procedure - stop drinking milk or drinks that contain milk.  2 hours before the procedure - stop drinking clear liquids. Medicine  You may be given medicines to lower anxiety.  Ask your health care provider about:  Changing or stopping your regular medicines. This is especially  important if you are taking diabetes medicines or blood thinners.  Taking medicines such as aspirin and ibuprofen. These medicines can thin your blood. Do not take these medicines before your procedure if your health care provider instructs you not to. General instructions  Plan to have someone take you home from the hospital or clinic. What happens during the procedure?  You may receive a medicine to help you relax (sedative).  You will be asked to lie on your abdomen.  The injection site will be cleaned.  A numbing medicine (local anesthetic) will be used to numb the injection site.  A needle will be inserted through your skin into the epidural space. You may feel some discomfort when this happens. An X-ray machine will be used to make sure the needle is put as close as possible to the affected nerve.  A steroid medicine and a local anesthetic will be injected into the epidural space.  The needle will be removed.  A bandage (dressing) will be put over the injection site. What happens after the procedure?  Your blood pressure, heart rate, breathing rate, and blood oxygen level will be monitored until the medicines you were given have worn off.  Your arm or leg may feel weak or numb for a few hours.  The injection site may feel sore.  Do not drive for 24 hours if you received a sedative. This information is not intended to replace advice given to you by your health care provider. Make sure you discuss any questions you have with your health care provider. Document Released: 12/07/2007 Document Revised: 02/11/2016 Document Reviewed: 12/16/2015 Elsevier Interactive Patient Education  2017 [place]   Time Period Pain Score Painful Area  Pre-procedure  _5__/10   Time Period Pain Score Area improved. Area not improved.  15 to 30 min post-procedure ____/10  1st hour after procedure ____/10    2nd hour after procedure ____/10    3rd hour after procedure ____/10     4th hour after procedure ____/10    5th hour after procedure ____/10    6th hour after procedure ____/10    7th hour after procedure ____/10    Time Period Pain Score Area improved. Area not improved.  Note: From here on, always document your pain score 1st thing in the morning.  1st day after procedure ____/10    2nd day after procedure ____/10    3rd day after procedure ____/10    4th day after procedure ____/10    5th day after procedure ____/10    Time Period Pain Score Area improved. Area not improved.  10th day after procedure ____/10    20th day after procedure ____/10     Benefits Indicate for each set of activities if the procedure changes your ability to accomplish them.  Activity Worse No-Change Improved  Dressing, eating, walking, toileting, hygiene     Shopping, housekeeping, food preparation, community transportation     Range of motion of affected area      Your opinion Please indicate which statement best describes your impression of this treatment.  Statement (X)  Based on the results, I am encouraged.   Based on the results, I am disappointed.   I am not sure I have an opinion at this point.    Note: Make sure to complete and return this form to your physician, on your follow-up appointment. This information will be used to interpret the results. Failure to accurately complete, or to return this information, may result in less than optimal outcomes. Post-procedure Information What to expect: Most procedures involve the use of a local anesthetic (numbing medicine), and a steroid (anti-inflammatory medicine).  The local anesthetics may cause temporary numbness and weakness of the legs or arms, depending on the location of the block. This numbness/weakness may last 4-6 hours, depending on the local anesthetic used. In rare instances, it can last up to 24 hours. While numb, you must be very careful not to injure the extremity.  After any procedure, you could  expect the pain to get better within 15-20 minutes. This relief is temporary and may last 4-6 hours. Once the local anesthetics wears off, you could experience discomfort, possibly more than usual, for up to 10 (ten) days. In the case of radiofrequencies, it may last up to 6 weeks. Surgeries may take up to 8 weeks for the healing process. The discomfort is due to the irritation caused by needles going through skin and muscle. To minimize the discomfort, we recommend using ice the first day, and heat from then on. The ice should be applied for 15 minutes on, and 15 minutes off. Keep repeating this cycle until bedtime. Avoid applying the ice directly to the skin, to prevent frostbite. Heat should be used daily, until the pain improves (4-10 days). Be careful not to burn yourself.  Occasionally you may experience muscle spasms or cramps. These occur as a consequence of the irritation caused by the needle sticks to the muscle and the blood that will inevitably be lost into the surrounding muscle tissue. Blood tends to be very irritating to tissues, which tend to react by going into spasm. These spasms may start the same day of your procedure, but they may also take days to develop. This late onset type of spasm or cramp is usually caused by electrolyte imbalances  triggered by the steroids, at the level of the kidney. Cramps and spasms tend to respond well to muscle relaxants, multivitamins (some are triggered by the procedure, but may have their origins in vitamin deficiencies), and "Gatorade", or any sports drinks that can replenish any electrolyte imbalances. (If you are a diabetic, ask your pharmacist to get you a sugar-free brand.) Warm showers or baths may also be helpful. Stretching exercises are highly recommended. General Instructions:  Be alert for signs of possible infection: redness, swelling, heat, red streaks, elevated temperature, and/or fever. These typically appear 4 to 6 days after the procedure.  Immediately notify your doctor if you experience unusual bleeding, difficulty breathing, or loss of bowel or bladder control. If you experience increased pain, do not increase your pain medicine intake, unless instructed by your pain physician. Post-Procedure Care:  Be careful in moving about. Muscle spasms in the area of the injection may occur. Applying ice or heat to the area is often helpful. The incidence of spinal headaches after epidural injections ranges between 1.4% and 6%. If you develop a headache that does not seem to respond to conservative therapy, please let your physician know. This can be treated with an epidural blood patch.   Post-procedure numbness or redness is to be expected, however it should average 4 to 6 hours. If numbness and weakness of your extremities begins to develop 4 to 6 hours after your procedure, and is felt to be progressing and worsening, immediately contact your physician.   Diet:  If you experience nausea, do not eat until this sensation goes away. If you had a "Stellate Ganglion Block" for upper extremity "Reflex Sympathetic Dystrophy", do not eat or drink until your hoarseness goes away. In any case, always start with liquids first and if you tolerate them well, then slowly progress to more solid foods. Activity:  For the first 4 to 6 hours after the procedure, use caution in moving about as you may experience numbness and/or weakness. Use caution in cooking, using household electrical appliances, and climbing steps. If you need to reach your Doctor call our office: [phone]) [phone] Monday-Thursday 8:00 am - 4:00 PM    Fridays: Closed     In case of an emergency: In case of emergency, call 911 or go to the nearest emergency room and have the physician there call us.  Interpretation of Procedure Every nerve block has two components: a diagnostic component, and a treatment component. Unrealistic expectations are the most common causes of "perceived failure".  In  a perfect world, a single nerve block should be able to completely and permanently eliminate the pain. Sadly, the world is not perfect.  Most pain management nerve blocks are performed using local anesthetics and steroids. Steroids are responsible for any long-term benefit that you may experience. Their purpose is to decrease any chronic swelling that may exist in the area. Steroids begin to work immediately after being injected. However, most patients will not experience any benefits until 5 to 10 days after the injection, when the swelling has come down to the point where they can tell a difference. Steroids will only help if there is swelling to be treated. As such, they can assist with the diagnosis. If effective, they suggest an inflammatory component to the pain, and if ineffective, they rule out inflammation as the main cause or component of the problem. If the problem is one of mechanical compression, you will get no benefit from those steroids.   In the case of  local anesthetics, they have a crucial role in the diagnosis of your condition. Most will begin to work within15 to 20 minutes after injection. The duration will depend on the type used (short- vs. Long-acting). It is of outmost importance that patients keep tract of their pain, after the procedure. To assist with this matter, a "Post-procedure Pain Diary" is provided. Make sure to complete it and to bring it back to your follow-up appointment.  As long as the patient keeps accurate, detailed records of their symptoms after every procedure, and returns to have those interpreted, every procedure will provide [place] with invaluable information. Even a block that does not provide the patient with any relief, will always provide [place] with information about the mechanism and the origin of the pain. The only time a nerve block can be considered a waste of time is when patients do not keep track of the results, or do not keep their post-procedure  appointment.  Reporting the results back to your physician The Pain Score  Pain is a subjective complaint. It cannot be seen, touched, or measured. We depend entirely on the patient's report of the pain in order to assess your condition and treatment. To evaluate the pain, we use a pain scale, where "0" means "No Pain", and a "10" is "the worst possible pain that you can even imagine" (i.e. something like been eaten alive by a shark or being torn apart by a lion).   You will frequently be asked to rate your pain. Please be as accurate, remember that medical decisions will be based on your responses. Please do not rate your pain above a 10. Doing so is actually interpreted as "symptom magnification" (exaggeration), as well as lack of understanding with regards to the scale. To put this into perspective, when you tell us that your pain is at a 10 (ten), what you are saying is that there is nothing we can do to make this pain any worse. (Carefully think about that.)

## 2016-11-09 ENCOUNTER — Telehealth: Payer: Self-pay | Admitting: *Deleted

## 2016-11-09 NOTE — Telephone Encounter (Signed)
Voicemail left with patient to call our office if there are questions or concerns re; the procedure on yesterday.

## 2016-12-09 ENCOUNTER — Ambulatory Visit: Payer: BLUE CROSS/BLUE SHIELD | Admitting: Pain Medicine

## 2016-12-16 ENCOUNTER — Ambulatory Visit: Payer: BLUE CROSS/BLUE SHIELD | Attending: Pain Medicine | Admitting: Pain Medicine

## 2016-12-16 ENCOUNTER — Encounter: Payer: Self-pay | Admitting: Pain Medicine

## 2016-12-16 VITALS — BP 121/84 | HR 90 | Temp 97.5°F | Resp 16 | Ht 69.0 in | Wt 185.0 lb

## 2016-12-16 DIAGNOSIS — M5442 Lumbago with sciatica, left side: Secondary | ICD-10-CM

## 2016-12-16 DIAGNOSIS — M47812 Spondylosis without myelopathy or radiculopathy, cervical region: Secondary | ICD-10-CM | POA: Insufficient documentation

## 2016-12-16 DIAGNOSIS — M533 Sacrococcygeal disorders, not elsewhere classified: Secondary | ICD-10-CM | POA: Insufficient documentation

## 2016-12-16 DIAGNOSIS — M546 Pain in thoracic spine: Secondary | ICD-10-CM | POA: Insufficient documentation

## 2016-12-16 DIAGNOSIS — M542 Cervicalgia: Secondary | ICD-10-CM | POA: Insufficient documentation

## 2016-12-16 DIAGNOSIS — Z79891 Long term (current) use of opiate analgesic: Secondary | ICD-10-CM

## 2016-12-16 DIAGNOSIS — M7918 Myalgia, other site: Secondary | ICD-10-CM

## 2016-12-16 DIAGNOSIS — G4730 Sleep apnea, unspecified: Secondary | ICD-10-CM | POA: Diagnosis not present

## 2016-12-16 DIAGNOSIS — M1288 Other specific arthropathies, not elsewhere classified, other specified site: Secondary | ICD-10-CM | POA: Diagnosis not present

## 2016-12-16 DIAGNOSIS — E7800 Pure hypercholesterolemia, unspecified: Secondary | ICD-10-CM | POA: Insufficient documentation

## 2016-12-16 DIAGNOSIS — M25552 Pain in left hip: Secondary | ICD-10-CM | POA: Insufficient documentation

## 2016-12-16 DIAGNOSIS — M25551 Pain in right hip: Secondary | ICD-10-CM | POA: Diagnosis not present

## 2016-12-16 DIAGNOSIS — M5441 Lumbago with sciatica, right side: Secondary | ICD-10-CM

## 2016-12-16 DIAGNOSIS — M25559 Pain in unspecified hip: Secondary | ICD-10-CM

## 2016-12-16 DIAGNOSIS — F329 Major depressive disorder, single episode, unspecified: Secondary | ICD-10-CM | POA: Insufficient documentation

## 2016-12-16 DIAGNOSIS — M79605 Pain in left leg: Secondary | ICD-10-CM | POA: Diagnosis not present

## 2016-12-16 DIAGNOSIS — M545 Low back pain: Secondary | ICD-10-CM | POA: Insufficient documentation

## 2016-12-16 DIAGNOSIS — M4726 Other spondylosis with radiculopathy, lumbar region: Secondary | ICD-10-CM | POA: Diagnosis not present

## 2016-12-16 DIAGNOSIS — F1190 Opioid use, unspecified, uncomplicated: Secondary | ICD-10-CM

## 2016-12-16 DIAGNOSIS — M791 Myalgia: Secondary | ICD-10-CM | POA: Diagnosis not present

## 2016-12-16 DIAGNOSIS — M4802 Spinal stenosis, cervical region: Secondary | ICD-10-CM | POA: Insufficient documentation

## 2016-12-16 DIAGNOSIS — M25512 Pain in left shoulder: Secondary | ICD-10-CM | POA: Diagnosis not present

## 2016-12-16 DIAGNOSIS — M79604 Pain in right leg: Secondary | ICD-10-CM | POA: Diagnosis not present

## 2016-12-16 DIAGNOSIS — M25511 Pain in right shoulder: Secondary | ICD-10-CM | POA: Insufficient documentation

## 2016-12-16 DIAGNOSIS — G8929 Other chronic pain: Secondary | ICD-10-CM | POA: Diagnosis not present

## 2016-12-16 DIAGNOSIS — G894 Chronic pain syndrome: Secondary | ICD-10-CM

## 2016-12-16 DIAGNOSIS — M47816 Spondylosis without myelopathy or radiculopathy, lumbar region: Secondary | ICD-10-CM

## 2016-12-16 DIAGNOSIS — R531 Weakness: Secondary | ICD-10-CM | POA: Diagnosis not present

## 2016-12-16 DIAGNOSIS — M25561 Pain in right knee: Secondary | ICD-10-CM | POA: Diagnosis not present

## 2016-12-16 DIAGNOSIS — M79673 Pain in unspecified foot: Secondary | ICD-10-CM | POA: Diagnosis not present

## 2016-12-16 DIAGNOSIS — Z8546 Personal history of malignant neoplasm of prostate: Secondary | ICD-10-CM | POA: Insufficient documentation

## 2016-12-16 MED ORDER — OXYCODONE HCL 5 MG PO TABS: 5.0000 mg | ORAL_TABLET | Freq: Three times a day (TID) | ORAL | 0 refills | Status: DC | PRN

## 2016-12-16 MED ORDER — CYCLOBENZAPRINE HCL 10 MG PO TABS: 10.0000 mg | ORAL_TABLET | Freq: Three times a day (TID) | ORAL | 2 refills | Status: DC | PRN

## 2016-12-16 NOTE — Progress Notes (Signed)
Nursing Pain Medication Assessment:  Safety precautions to be maintained throughout the outpatient stay will include: orient to surroundings, keep bed in low position, maintain call bell within reach at all times, provide assistance with transfer out of bed and ambulation.  Medication Inspection Compliance: Pill count conducted under aseptic conditions, in front of the patient. Neither the pills nor the bottle was removed from the patient's sight at any time. Once count was completed pills were immediately returned to the patient in their original bottle.  Medication: Oxycodone IR Pill/Patch Count: 64. of 90 pills remain Pill/Patch Appearance: Markings consistent with prescribed medication Bottle Appearance: Standard pharmacy container. Clearly labeled. Filled Date: 03/27 / 2018 Last Medication intake:  Yesterday

## 2016-12-16 NOTE — Patient Instructions (Addendum)
Pain Score  Introduction: The pain score used by this practice is the Verbal Numerical Rating Scale (VNRS-11). This is an 11-point scale. It is for adults and children 10 years or older. There are significant differences in how the pain score is reported, used, and applied. Forget everything you learned in the past and learn this scoring system.  General Information: The scale should reflect your current level of pain. Unless you are specifically asked for the level of your worst pain, or your average pain. If you are asked for one of these two, then it should be understood that it is over the past 24 hours.  Basic Activities of Daily Living (ADL): Personal hygiene, dressing, eating, transferring, and using restroom.  Instructions: Most patients tend to report their level of pain as a combination of two factors, their physical pain and their psychosocial pain. This last one is also known as "suffering" and it is reflection of how physical pain affects you socially and psychologically. From now on, report them separately. From this point on, when asked to report your pain level, report only your physical pain. Use the following table for reference.  Pain Clinic Pain Levels (0-5/10)  Pain Level Score Description  No Pain 0   Mild pain 1 Nagging, annoying, but does not interfere with basic activities of daily living (ADL). Patients are able to eat, bathe, get dressed, toileting (being able to get on and off the toilet and perform personal hygiene functions), transfer (move in and out of bed or a chair without assistance), and maintain continence (able to control bladder and bowel functions). Blood pressure and heart rate are unaffected. A normal heart rate for a healthy adult ranges from 60 to 100 bpm (beats per minute).   Mild to moderate pain 2 Noticeable and distracting. Impossible to hide from other people. More frequent flare-ups. Still possible to adapt and function close to normal. It can be very  annoying and may have occasional stronger flare-ups. With discipline, patients may get used to it and adapt.   Moderate pain 3 Interferes significantly with activities of daily living (ADL). It becomes difficult to feed, bathe, get dressed, get on and off the toilet or to perform personal hygiene functions. Difficult to get in and out of bed or a chair without assistance. Very distracting. With effort, it can be ignored when deeply involved in activities.   Moderately severe pain 4 Impossible to ignore for more than a few minutes. With effort, patients may still be able to manage work or participate in some social activities. Very difficult to concentrate. Signs of autonomic nervous system discharge are evident: dilated pupils (mydriasis); mild sweating (diaphoresis); sleep interference. Heart rate becomes elevated (>115 bpm). Diastolic blood pressure (lower number) rises above 100 mmHg. Patients find relief in laying down and not moving.   Severe pain 5 Intense and extremely unpleasant. Associated with frowning face and frequent crying. Pain overwhelms the senses.  Ability to do any activity or maintain social relationships becomes significantly limited. Conversation becomes difficult. Pacing back and forth is common, as getting into a comfortable position is nearly impossible. Pain wakes you up from deep sleep. Physical signs will be obvious: pupillary dilation; increased sweating; goosebumps; brisk reflexes; cold, clammy hands and feet; nausea, vomiting or dry heaves; loss of appetite; significant sleep disturbance with inability to fall asleep or to remain asleep. When persistent, significant weight loss is observed due to the complete loss of appetite and sleep deprivation.  Blood pressure and heart   rate becomes significantly elevated. Caution: If elevated blood pressure triggers a pounding headache associated with blurred vision, then the patient should immediately seek attention at an urgent or  emergency care unit, as these may be signs of an impending stroke.    Emergency Department Pain Levels (6-10/10)  Emergency Room Pain 6 Severely limiting. Requires emergency care and should not be seen or managed at an outpatient pain management facility. Communication becomes difficult and requires great effort. Assistance to reach the emergency department may be required. Facial flushing and profuse sweating along with potentially dangerous increases in heart rate and blood pressure will be evident.   Distressing pain 7 Self-care is very difficult. Assistance is required to transport, or use restroom. Assistance to reach the emergency department will be required. Tasks requiring coordination, such as bathing and getting dressed become very difficult.   Disabling pain 8 Self-care is no longer possible. At this level, pain is disabling. The individual is unable to do even the most "basic" activities such as walking, eating, bathing, dressing, transferring to a bed, or toileting. Fine motor skills are lost. It is difficult to think clearly.   Incapacitating pain 9 Pain becomes incapacitating. Thought processing is no longer possible. Difficult to remember your own name. Control of movement and coordination are lost.   The worst pain imaginable 10 At this level, most patients pass out from pain. When this level is reached, collapse of the autonomic nervous system occurs, leading to a sudden drop in blood pressure and heart rate. This in turn results in a temporary and dramatic drop in blood flow to the brain, leading to a loss of consciousness. Fainting is one of the body's self defense mechanisms. Passing out puts the brain in a calmed state and causes it to shut down for a while, in order to begin the healing process.    Summary: 1. Refer to this scale when providing [place] with your pain level. 2. Be accurate and careful when reporting your pain level. This will help with your care. 3. Over-reporting  your pain level will lead to loss of credibility. 4. Even a level of 1/10 means that there is pain and will be treated at our facility. 5. High, inaccurate reporting will be documented as "Symptom Exaggeration", leading to loss of credibility and suspicions of possible secondary gains such as obtaining more narcotics, or wanting to appear disabled, for fraudulent reasons. 6. Only pain levels of 5 or below will be seen at our facility. 7. Pain levels of 6 and above will be sent to the Emergency Department and the appointment cancelled. _____________________________________________________________________________________________  Preparing for Procedure with Sedation Instructions: . Oral Intake: Do not eat or drink anything for at least 8 hours prior to your procedure. . Transportation: Public transportation is not allowed. Bring an adult driver. The driver must be physically present in our waiting room before any procedure can be started. Marland Kitchen Physical Assistance: Bring an adult physically capable of assisting you, in the event you need help. This adult should keep you company at home for at least 6 hours after the procedure. . Blood Pressure Medicine: Take your blood pressure medicine with a sip of water the morning of the procedure. . Blood thinners:  . Diabetics on insulin: Notify the staff so that you can be scheduled 1st case in the morning. If your diabetes requires high dose insulin, take only  of your normal insulin dose the morning of the procedure and notify the staff that you have done  so. . Preventing infections: Shower with an antibacterial soap the morning of your procedure. . Build-up your immune system: Take 1000 mg of Vitamin C with every meal (3 times a day) the day prior to your procedure. Marland Kitchen Antibiotics: Inform the staff if you have a condition or reason that requires you to take antibiotics before dental procedures. . Pregnancy: If you are pregnant, call and cancel the  procedure. . Sickness: If you have a cold, fever, or any active infections, call and cancel the procedure. . Arrival: You must be in the facility at least 30 minutes prior to your scheduled procedure. . Children: Do not bring children with you. . Dress appropriately: Bring dark clothing that you would not mind if they get stained. . Valuables: Do not bring any jewelry or valuables. Procedure appointments are reserved for interventional treatments only. Marland Kitchen No Prescription Refills. . No medication changes will be discussed during procedure appointments. . No disability issues will be discussed.  ____________________________________________________________________________________________  GENERAL RISKS AND COMPLICATIONS  What are the risk, side effects and possible complications? Generally speaking, most procedures are safe.  However, with any procedure there are risks, side effects, and the possibility of complications.  The risks and complications are dependent upon the sites that are lesioned, or the type of nerve block to be performed.  The closer the procedure is to the spine, the more serious the risks are.  Great care is taken when placing the radio frequency needles, block needles or lesioning probes, but sometimes complications can occur. 1. Infection: Any time there is an injection through the skin, there is a risk of infection.  This is why sterile conditions are used for these blocks.  There are four possible types of infection. 1. Localized skin infection. 2. Central Nervous System Infection-This can be in the form of Meningitis, which can be deadly. 3. Epidural Infections-This can be in the form of an epidural abscess, which can cause pressure inside of the spine, causing compression of the spinal cord with subsequent paralysis. This would require an emergency surgery to decompress, and there are no guarantees that the patient would recover from the paralysis. 4. Discitis-This is an  infection of the intervertebral discs.  It occurs in about 1% of discography procedures.  It is difficult to treat and it may lead to surgery.        2. Pain: the needles have to go through skin and soft tissues, will cause soreness.       3. Damage to internal structures:  The nerves to be lesioned may be near blood vessels or    other nerves which can be potentially damaged.       4. Bleeding: Bleeding is more common if the patient is taking blood thinners such as  aspirin, Coumadin, Ticiid, Plavix, etc., or if he/she have some genetic predisposition  such as hemophilia. Bleeding into the spinal canal can cause compression of the spinal  cord with subsequent paralysis.  This would require an emergency surgery to  decompress and there are no guarantees that the patient would recover from the  paralysis.       5. Pneumothorax:  Puncturing of a lung is a possibility, every time a needle is introduced in  the area of the chest or upper back.  Pneumothorax refers to free air around the  collapsed lung(s), inside of the thoracic cavity (chest cavity).  Another two possible  complications related to a similar event would include: Hemothorax and Chylothorax.  These are variations of the Pneumothorax, where instead of air around the collapsed  lung(s), you may have blood or chyle, respectively.       6. Spinal headaches: They may occur with any procedures in the area of the spine.       7. Persistent CSF (Cerebro-Spinal Fluid) leakage: This is a rare problem, but may occur  with prolonged intrathecal or epidural catheters either due to the formation of a fistulous  track or a dural tear.       8. Nerve damage: By working so close to the spinal cord, there is always a possibility of  nerve damage, which could be as serious as a permanent spinal cord injury with  paralysis.       9. Death:  Although rare, severe deadly allergic reactions known as "Anaphylactic  reaction" can occur to any of the medications used.       10. Worsening of the symptoms:  We can always make thing worse.  What are the chances of something like this happening? Chances of any of this occuring are extremely low.  By statistics, you have more of a chance of getting killed in a motor vehicle accident: while driving to the hospital than any of the above occurring .  Nevertheless, you should be aware that they are possibilities.  In general, it is similar to taking a shower.  Everybody knows that you can slip, hit your head and get killed.  Does that mean that you should not shower again?  Nevertheless always keep in mind that statistics do not mean anything if you happen to be on the wrong side of them.  Even if a procedure has a 1 (one) in a 1,000,000 (million) chance of going wrong, it you happen to be that one..Also, keep in mind that by statistics, you have more of a chance of having something go wrong when taking medications.  Who should not have this procedure? If you are on a blood thinning medication (e.g. Coumadin, Plavix, see list of "Blood Thinners"), or if you have an active infection going on, you should not have the procedure.  If you are taking any blood thinners, please inform your physician.  How should I prepare for this procedure?  Do not eat or drink anything at least six hours prior to the procedure.  Bring a driver with you .  It cannot be a taxi.  Come accompanied by an adult that can drive you back, and that is strong enough to help you if your legs get weak or numb from the local anesthetic.  Take all of your medicines the morning of the procedure with just enough water to swallow them.  If you have diabetes, make sure that you are scheduled to have your procedure done first thing in the morning, whenever possible.  If you have diabetes, take only half of your insulin dose and notify our nurse that you have done so as soon as you arrive at the clinic.  If you are diabetic, but only take blood sugar pills  (oral hypoglycemic), then do not take them on the morning of your procedure.  You may take them after you have had the procedure.  Do not take aspirin or any aspirin-containing medications, at least eleven (11) days prior to the procedure.  They may prolong bleeding.  Wear loose fitting clothing that may be easy to take off and that you would not mind if it got stained with Betadine or blood.  Do not wear any jewelry or perfume  Remove any nail coloring.  It will interfere with some of our monitoring equipment.  NOTE: Remember that this is not meant to be interpreted as a complete list of all possible complications.  Unforeseen problems may occur.  BLOOD THINNERS The following drugs contain aspirin or other products, which can cause increased bleeding during surgery and should not be taken for 2 weeks prior to and 1 week after surgery.  If you should need take something for relief of minor pain, you may take acetaminophen which is found in Tylenol,m Datril, Anacin-3 and Panadol. It is not blood thinner. The products listed below are.  Do not take any of the products listed below in addition to any listed on your instruction sheet.  A.P.C or A.P.C with Codeine Codeine Phosphate Capsules #3 Ibuprofen Ridaura  ABC compound Congesprin Imuran rimadil  Advil Cope Indocin Robaxisal  Alka-Seltzer Effervescent Pain Reliever and Antacid Coricidin or Coricidin-D  Indomethacin Rufen  Alka-Seltzer plus Cold Medicine Cosprin Ketoprofen S-A-C Tablets  Anacin Analgesic Tablets or Capsules Coumadin Korlgesic Salflex  Anacin Extra Strength Analgesic tablets or capsules CP-2 Tablets Lanoril Salicylate  Anaprox Cuprimine Capsules Levenox Salocol  Anexsia-D Dalteparin Magan Salsalate  Anodynos Darvon compound Magnesium Salicylate Sine-off  Ansaid Dasin Capsules Magsal Sodium Salicylate  Anturane Depen Capsules Marnal Soma  APF Arthritis pain formula Dewitt's Pills Measurin Stanback  Argesic Dia-Gesic  Meclofenamic Sulfinpyrazone  Arthritis Bayer Timed Release Aspirin Diclofenac Meclomen Sulindac  Arthritis pain formula Anacin Dicumarol Medipren Supac  Analgesic (Safety coated) Arthralgen Diffunasal Mefanamic Suprofen  Arthritis Strength Bufferin Dihydrocodeine Mepro Compound Suprol  Arthropan liquid Dopirydamole Methcarbomol with Aspirin Synalgos  ASA tablets/Enseals Disalcid Micrainin Tagament  Ascriptin Doan's Midol Talwin  Ascriptin A/D Dolene Mobidin Tanderil  Ascriptin Extra Strength Dolobid Moblgesic Ticlid  Ascriptin with Codeine Doloprin or Doloprin with Codeine Momentum Tolectin  Asperbuf Duoprin Mono-gesic Trendar  Aspergum Duradyne Motrin or Motrin IB Triminicin  Aspirin plain, buffered or enteric coated Durasal Myochrisine Trigesic  Aspirin Suppositories Easprin Nalfon Trillsate  Aspirin with Codeine Ecotrin Regular or Extra Strength Naprosyn Uracel  Atromid-S Efficin Naproxen Ursinus  Auranofin Capsules Elmiron Neocylate Vanquish  Axotal Emagrin Norgesic Verin  Azathioprine Empirin or Empirin with Codeine Normiflo Vitamin E  Azolid Emprazil Nuprin Voltaren  Bayer Aspirin plain, buffered or children's or timed BC Tablets or powders Encaprin Orgaran Warfarin Sodium  Buff-a-Comp Enoxaparin Orudis Zorpin  Buff-a-Comp with Codeine Equegesic Os-Cal-Gesic   Buffaprin Excedrin plain, buffered or Extra Strength Oxalid   Bufferin Arthritis Strength Feldene Oxphenbutazone   Bufferin plain or Extra Strength Feldene Capsules Oxycodone with Aspirin   Bufferin with Codeine Fenoprofen Fenoprofen Pabalate or Pabalate-SF   Buffets II Flogesic Panagesic   Buffinol plain or Extra Strength Florinal or Florinal with Codeine Panwarfarin   Buf-Tabs Flurbiprofen Penicillamine   Butalbital Compound Four-way cold tablets Penicillin   Butazolidin Fragmin Pepto-Bismol   Carbenicillin Geminisyn Percodan   Carna Arthritis Reliever Geopen Persantine   Carprofen Gold's salt Persistin    Chloramphenicol Goody's Phenylbutazone   Chloromycetin Haltrain Piroxlcam   Clmetidine heparin Plaquenil   Cllnoril Hyco-pap Ponstel   Clofibrate Hydroxy chloroquine Propoxyphen         Before stopping any of these medications, be sure to consult the physician who ordered them.  Some, such as Coumadin (Warfarin) are ordered to prevent or treat serious conditions such as "deep thrombosis", "pumonary embolisms", and other heart problems.  The amount of time that you may need off of the  medication may also vary with the medication and the reason for which you were taking it.  If you are taking any of these medications, please make sure you notify your pain physician before you undergo any procedures.          Facet Joint Block The facet joints connect the bones of the spine (vertebrae). They make it possible for you to bend, twist, and make other movements with your spine. They also keep you from bending too far, twisting too far, and making other excessive movements. A facet joint block is a procedure where a numbing medicine (anesthetic) is injected into a facet joint. Often, a type of anti-inflammatory medicine called a steroid is also injected. A facet joint block may be done to diagnose neck or back pain. If the pain gets better after a facet joint block, it means the pain is probably coming from the facet joint. If the pain does not get better, it means the pain is probably not coming from the facet joint. A facet joint block may also be done to relieve neck or back pain caused by an inflamed facet joint. A facet joint block is only done to relieve pain if the pain does not improve with other methods, such as medicine, exercise programs, and physical therapy. Tell a health care provider about:  Any allergies you have.  All medicines you are taking, including vitamins, herbs, eye drops, creams, and over-the-counter medicines.  Any problems you or family members have had with anesthetic  medicines.  Any blood disorders you have.  Any surgeries you have had.  Any medical conditions you have.  Whether you are pregnant or may be pregnant. What are the risks? Generally, this is a safe procedure. However, problems may occur, including:  Bleeding.  Injury to a nerve near the injection site.  Pain at the injection site.  Weakness or numbness in areas controlled by nerves near the injection site.  Infection.  Temporary fluid retention.  Allergic reactions to medicines or dyes.  Injury to other structures or organs near the injection site. What happens before the procedure?  Follow instructions from your health care provider about eating or drinking restrictions.  Ask your health care provider about:  Changing or stopping your regular medicines. This is especially important if you are taking diabetes medicines or blood thinners.  Taking medicines such as aspirin and ibuprofen. These medicines can thin your blood. Do not take these medicines before your procedure if your health care provider instructs you not to.  Do not take any new dietary supplements or medicines without asking your health care provider first.  Plan to have someone take you home after the procedure. What happens during the procedure?  You may need to remove your clothing and dress in an open-back gown.  The procedure will be done while you are lying on an X-ray table. You will most likely be asked to lie on your stomach, but you may be asked to lie in a different position if an injection will be made in your neck.  Machines will be used to monitor your oxygen levels, heart rate, and blood pressure.  If an injection will be made in your neck, an IV tube will be inserted into one of your veins. Fluids and medicine will flow directly into your body through the IV tube.  The area over the facet joint where the injection will be made will be cleaned with soap. The surrounding skin will be  covered with  clean drapes.  A numbing medicine (local anesthetic) will be applied to your skin. Your skin may sting or burn for a moment.  A video X-ray machine (fluoroscopy) will be used to locate the joint. In some cases, a CT scan may be used.  A contrast dye may be injected into the facet joint area to help locate the joint.  When the joint is located, an anesthetic will be injected into the joint through the needle.  Your health care provider will ask you whether you feel pain relief. If you do feel relief, a steroid may be injected to provide pain relief for a longer period of time. If you do not feel relief or feel only partial relief, additional injections of an anesthetic may be made in other facet joints.  The needle will be removed.  Your skin will be cleaned.  A bandage (dressing) will be applied over each injection site. The procedure may vary among health care providers and hospitals. What happens after the procedure?  You will be observed for 15-30 minutes before being allowed to go home. This information is not intended to replace advice given to you by your health care provider. Make sure you discuss any questions you have with your health care provider. Document Released: 01/19/2007 Document Revised: 10/01/2015 Document Reviewed: 05/26/2015 [employer]vier Interactive Patient Education  2017 [employer].

## 2016-12-16 NOTE — Progress Notes (Signed)
Patient's Name: Ricky Mercer  MRN: [id number]  Referring Provider: Sofie Hartigan, MD  DOB: Jul 20, 1951  PCP: Sofie Hartigan, MD  DOS: 12/16/2016  Note by: Kathlen Brunswick. Dossie Arbour, MD  Service setting: Ambulatory outpatient  Specialty: Interventional Pain Management  Location: [facility] (AMB) Pain Management Facility    Patient type: Established   Primary Reason(s) for Visit: Encounter for prescription drug management & post-procedure evaluation of chronic illness with mild to moderate exacerbation(Level of risk: moderate) CC: Back Pain (lower)  HPI  Ricky Mercer is a 66 y.o. year old, male patient, who comes today for a post-procedure evaluation and medication management. He has Hand weakness; Polyradiculopathy; Long term current use of opiate analgesic; Long term prescription opiate use; Opiate use (45 MME/Day); Opiate dependence ([place]); Encounter for therapeutic drug level monitoring; Chronic low back pain  (Location of Primary Pain) (Bilateral) (L>R); Chronic lower extremity pain (Location of Tertiary source of pain) (Bilateral) (L>R); Chronic lumbar radicular pain (Bilateral) (L>R) (Bilateral S1 dermatomal distribution); Chronic hip pain (Location of Secondary source of pain) (Bilateral) (L>R); Chronic upper back pain (Bilateral) (L>R); Chronic neck pain (neck<shoulder) (L>R); Chronic foot pain; Chronic knee pain (Right); Cervical spondylosis; 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); Chronic upper extremity pain (Bilateral) (L>R); Lumbar spondylosis; Cervical facet hypertrophy; Cervical facet syndrome (R>L); History of prostate cancer; Radicular pain of shoulder; Class I Morbid obesity (HCC) (68% higher incidence of chronic low back problems); Peripheral neuropathy (HCC) (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); Lumbar facet syndrome (Bilateral) (L>R); Sleep apnea; Depression; High cholesterol; Low testosterone; History of surgical procedure; Opioid-induced constipation  (OIC); Chronic shoulder pain (Bilateral) (L>R); Breathing orally; Chest pain; Primary cardiomyopathy ([place]); Chronic sacroiliac joint pain (Bilateral) (L>R); Musculoskeletal pain; Pain of right foot; Neurogenic pain; Chest pain with high risk for cardiac etiology; Chronic pain syndrome; and Bilateral arm pain on his problem list. His primarily concern today is the Back Pain (lower)  Pain Assessment: Self-Reported Pain Score: 4 /10 Clinically the patient looks like a 2/10 Reported level is inconsistent with clinical observations. Information on the proper use of the pain scale provided to the patient today Pain Type: Chronic pain Pain Location: Back Pain Orientation: Lower Pain Descriptors / Indicators: Aching Pain Frequency: Constant  [name] was last seen on 11/08/2016 for a procedure. During today's appointment we reviewed Mr. Volante post-procedure results, as well as his outpatient medication regimen.  Further details on both, my assessment(s), as well as the proposed treatment plan, please see below.  Controlled Substance Pharmacotherapy Assessment REMS (Risk Evaluation and Mitigation Strategy)  Analgesic:Oxycodone IR 5 mg every 8 hours (15 mg/day) MME/day:22.5 mg/day. Landis Martins, RN  12/16/2016 10:54 AM  Sign at close encounter Nursing Pain Medication Assessment:  Safety precautions to be maintained throughout the outpatient stay will include: orient to surroundings, keep bed in low position, maintain call bell within reach at all times, provide assistance with transfer out of bed and ambulation.  Medication Inspection Compliance: Pill count conducted under aseptic conditions, in front of the patient. Neither the pills nor the bottle was removed from the patient's sight at any time. Once count was completed pills were immediately returned to the patient in their original bottle.  Medication: Oxycodone IR Pill/Patch Count: 64. of 90 pills remain Pill/Patch Appearance:  Markings consistent with prescribed medication Bottle Appearance: Standard pharmacy container. Clearly labeled. Filled Date: 03/27 / 2018 Last Medication intake:  Yesterday   Pharmacokinetics: Liberation and absorption (onset of action): WNL Distribution (time to  peak effect): WNL Metabolism and excretion (duration of action): WNL         Pharmacodynamics: Desired effects: Analgesia: Ricky Mercer reports >50% benefit. Functional ability: Patient reports that medication allows him to accomplish basic ADLs Clinically meaningful improvement in function (CMIF): Sustained CMIF goals met Perceived effectiveness: Described as relatively effective, allowing for increase in activities of daily living (ADL) Undesirable effects: Side-effects or Adverse reactions: None reported Monitoring: [place] PMP: Online review of the past 47-monthperiod conducted. Compliant with practice rules and regulations List of all UDS test(s) done:  Lab Results  Component Value Date   TOXASSSELUR FINAL 12/23/2015   TOXASSSELUR FINAL 11/17/2015   TOXASSSELUR FINAL 07/23/2015   Last UDS on record: ToxAssure Select 13  Date Value Ref Range Status  12/23/2015 FINAL  Final    Comment:    ==================================================================== TOXASSURE SELECT 13 (MW) ==================================================================== Test                             Result       Flag       Units Drug Present and Declared for Prescription Verification   Oxycodone                      77           EXPECTED   ng/mg creat   Oxymorphone                    886          EXPECTED   ng/mg creat   Noroxycodone                   404          EXPECTED   ng/mg creat   Noroxymorphone                 329          EXPECTED   ng/mg creat    Sources of oxycodone are scheduled prescription medications.    Oxymorphone, noroxycodone, and noroxymorphone are expected    metabolites of oxycodone. Oxymorphone is also available  as a    scheduled prescription medication. ==================================================================== Test                      Result    Flag   Units      Ref Range   Creatinine              160              mg/dL      >=20 ==================================================================== Declared Medications:  The flagging and interpretation on this report are based on the  following declared medications.  Unexpected results may arise from  inaccuracies in the declared medications.  **Note: The testing scope of this panel includes these medications:  Oxycodone  **Note: The testing scope of this panel does not include following  reported medications:  Acetaminophen  Aspirin  Bisacodyl  Citalopram  Diphenhydramine  Docusate  Pantoprazole  Simvastatin  Supplement  Testosterone ==================================================================== For clinical consultation, please call ([phone]====    UDS interpretation: Compliant          Medication Assessment Form: Reviewed. Patient indicates being compliant with therapy Treatment compliance: Compliant Risk Assessment Profile: Aberrant behavior: See prior evaluations. None observed or detected today Comorbid factors increasing risk of overdose: See prior notes. No  additional risks detected today Risk of substance use disorder (SUD): Low Opioid Risk Tool (ORT) Total Score:    Interpretation Table:  Score <3 = Low Risk for SUD  Score between 4-7 = Moderate Risk for SUD  Score >8 = High Risk for Opioid Abuse   Risk Mitigation Strategies:  Patient Counseling: Covered Patient-Prescriber Agreement (PPA): Present and active  Notification to other healthcare providers: Done  Pharmacologic Plan: No change in therapy, at this time  Post-Procedure Assessment  11/08/2016 Procedure: Diagnostic left cervical epidural steroid injection under  fluoroscopic guidance, no sedation Pre-procedure pain score:  6/10 Post-procedure pain score: 0/10 (100% relief) Influential Factors: BMI: 27.32 kg/m Intra-procedural challenges: None observed Assessment challenges: Results reported today are inconsistent with those reported on procedure day, immediately before discharge. Previously the patient had reported 100% relief of the pain, before leaving the facility Post-procedural side-effects, adverse reactions, or complications: None reported Reported issues: None  Sedation: Sedation provided. When no sedatives are used, the analgesic levels obtained are directly associated to the effectiveness of the local anesthetics. However, when sedation is provided, the level of analgesia obtained during the initial 1 hour following the intervention, is believed to be the result of a combination of factors. These factors may include, but are not limited to: 1. The effectiveness of the local anesthetics used. 2. The effects of the analgesic(s) and/or anxiolytic(s) used. 3. The degree of discomfort experienced by the patient at the time of the procedure. 4. The patients ability and reliability in recalling and recording the events. 5. The presence and influence of possible secondary gains and/or psychosocial factors. Reported result: Relief experienced during the 1st hour after the procedure: 50 % (Ultra-Short Term Relief) Interpretative annotation: No Analgesic or Anxiolytic given, therefore benefits are completely due to Local Anesthetics. Patient does not appear to have understood instructions on differential evaluation of treated vs untreated area, leading to an inaccurate global report  Effects of local anesthetic: The analgesic effects attained during this period are directly associated to the localized infiltration of local anesthetics and therefore cary significant diagnostic value as to the etiological location, or anatomical origin, of the pain.  Expected duration of relief is directly dependent on the pharmacodynamics of the local anesthetic used. Long-acting (4-6 hours) anesthetics used.  Reported result: Relief during the next 4 to 6 hour after the procedure: 50 % (Short-Term Relief) Interpretative annotation: Complete relief would suggest area to be the source of the pain.          Long-term benefit: Defined as the period of time past the expected duration of local anesthetics. With the possible exception of prolonged sympathetic blockade from the local anesthetics, benefits during this period are typically attributed to, or associated with, other factors such as analgesic sensory neuropraxia, antiinflammatory effects, or beneficial biochemical changes provided by agents other than the local anesthetics Reported result: Extended relief following procedure: 30 % (Long-Term Relief) Interpretative annotation: Good relief. This could suggest inflammation to be a significant component in the etiology to the pain.          Current benefits: Defined as persistent relief that continues at this point in time.   Reported results: Treated area: 30 %       Interpretative annotation: Ongoing benefits would suggest effective therapeutic approach  Interpretation: Results would suggest a successful diagnostic intervention.          Laboratory Chemistry  Inflammation Markers Lab Results  Component Value Date   CRP <0.5 08/21/2015   ESRSEDRATE  4 08/21/2015   (CRP: Acute Phase) (ESR: Chronic Phase) Renal Function Markers Lab Results  Component Value Date   BUN 13 11/25/2015   CREATININE 0.96 11/25/2015   GFRAA >60 11/25/2015   GFRNONAA >60 11/25/2015   Hepatic Function Markers Lab Results  Component Value Date   AST 31 11/25/2015   ALT 41 11/25/2015   ALBUMIN 3.9 11/25/2015   ALKPHOS 71 11/25/2015   Electrolytes Lab Results  Component Value Date   NA 138 11/25/2015   K 4.2 11/25/2015   CL 104 11/25/2015   CALCIUM 9.0 11/25/2015     MG 2.1 08/21/2015   Neuropathy Markers No results found for: [id number] Bone Pathology Markers Lab Results  Component Value Date   ALKPHOS 71 11/25/2015   CALCIUM 9.0 11/25/2015   Coagulation Parameters Lab Results  Component Value Date   PLT 238 11/25/2015   Cardiovascular Markers Lab Results  Component Value Date   HGB 14.6 11/25/2015   HCT 43.5 11/25/2015   Note: Lab results reviewed.  Recent Diagnostic Imaging Review  Dg C-arm 1-60 Min-no Report  Result Date: 11/08/2016 Fluoroscopy was utilized by the requesting physician.  No radiographic interpretation.   Note: Imaging results reviewed.          Meds  The patient has a current medication list which includes the following prescription(s): androgel pump, aspirin, cyclobenzaprine, oxycodone, oxycodone, oxycodone, pantoprazole, and simvastatin.  Current Outpatient Prescriptions on File Prior to Visit  Medication Sig  . ANDROGEL PUMP 20.25 MG/ACT (1.62%) GEL APPLY TO 2 PUMPS ONCE A DAY AS DIRECTED  . aspirin 81 MG tablet Take 81 mg by mouth daily.  . pantoprazole (PROTONIX) 40 MG tablet Take 40 mg by mouth daily.  . simvastatin (ZOCOR) 10 MG tablet Take 10 mg by mouth at bedtime.    No current facility-administered medications on file prior to visit.    ROS  Constitutional: Denies any fever or chills Gastrointestinal: No reported hemesis, hematochezia, vomiting, or acute GI distress Musculoskeletal: Denies any acute onset joint swelling, redness, loss of ROM, or weakness Neurological: No reported episodes of acute onset apraxia, aphasia, dysarthria, agnosia, amnesia, paralysis, loss of coordination, or loss of consciousness  Allergies  Mr. Trompeter has No Known Allergies.  PFSH  Drug: Mr. Isabell  reports that he does not use drugs. Alcohol:  reports that he does not drink alcohol. Tobacco:  reports that he has never smoked. He has never used smokeless tobacco. Medical:  has a past medical history of  Bilateral arm pain; Cardiomyopathy ([place]); Diverticulosis; Dupuytren's contracture; Foot pain, bilateral; GERD (gastroesophageal reflux disease); Hand weakness; Hyperlipidemia; Knee pain; Midline low back pain; Osteoarthritis; Pain in neck; Prostate cancer ([place]); Reactive airway disease; Seasonal allergies; Situational anxiety; and Sleep apnea. Family: family history includes Colon cancer in his brother; Hyperlipidemia in his sister; Lung cancer in his father; Prostate cancer in his brother.  Past Surgical History:  Procedure Laterality Date  . CHOLECYSTECTOMY N/A 12/02/2015   Procedure: LAPAROSCOPIC CHOLECYSTECTOMY;  Surgeon: Leonie Green, MD;  Location: [facility] ORS;  Service: General;  Laterality: N/A;  . COLONOSCOPY  10/24/2012  . ESOPHAGOGASTRODUODENOSCOPY (EGD) WITH PROPOFOL N/A 10/28/2015   Procedure: ESOPHAGOGASTRODUODENOSCOPY (EGD) WITH PROPOFOL;  Surgeon: Lollie Sails, MD;  [facility] ENDOSCOPY;  Service: Endoscopy;  Laterality: N/A;  . HAND SURGERY Bilateral   . HERNIA REPAIR    . MASS EXCISION Right 07/18/2015   Procedure: EXCISION MASS RIGHT HAND;  Surgeon: Leanor Kail, MD;  Location: [facility];  Service:  Orthopedics;  Laterality: Right;  CPAP  . NOSE SURGERY    . prostate seeding    . SKIN CANCER EXCISION Left 2015   shoulder  . TONSILLECTOMY  2009   Surgery for OSA (screw in place)  . UPPER GI ENDOSCOPY  12/18/2012   x3   Constitutional Exam  General appearance: Well nourished, well developed, and well hydrated. In no apparent acute distress Vitals:   12/16/16 1047  BP: 121/84  Pulse: 90  Resp: 16  Temp: 97.5 F (36.4 C)  TempSrc: Oral  SpO2: 99%  Weight: 185 lb (83.9 kg)  Height: 5' 9"  (1.753 m)   BMI Assessment: Estimated body mass index is 27.32 kg/m as calculated from the following:   Height as of this encounter: 5' 9"  (1.753 m).   Weight as of this encounter: 185 lb (83.9 kg).  BMI interpretation table: BMI level Category Range  association with higher incidence of chronic pain  <18 kg/m2 Underweight   18.5-24.9 kg/m2 Ideal body weight   25-29.9 kg/m2 Overweight Increased incidence by 20%  30-34.9 kg/m2 Obese (Class I) Increased incidence by 68%  35-39.9 kg/m2 Severe obesity (Class II) Increased incidence by 136%  >40 kg/m2 Extreme obesity (Class III) Increased incidence by 254%   BMI Readings from Last 4 Encounters:  12/16/16 27.32 kg/m  11/08/16 27.32 kg/m  11/01/16 27.32 kg/m  09/27/16 27.32 kg/m   Wt Readings from Last 4 Encounters:  12/16/16 185 lb (83.9 kg)  11/08/16 185 lb (83.9 kg)  11/01/16 185 lb (83.9 kg)  09/27/16 185 lb (83.9 kg)  Psych/Mental status: Alert, oriented x 3 (person, place, & time)       Eyes: PERLA Respiratory: No evidence of acute respiratory distress  Cervical Spine Exam  Inspection: No masses, redness, or swelling Alignment: Symmetrical Functional ROM: Unrestricted ROM Stability: No instability detected Muscle strength & Tone: Functionally intact Sensory: Unimpaired Palpation: No palpable anomalies  Upper Extremity (UE) Exam    Side: Right upper extremity  Side: Left upper extremity  Inspection: No masses, redness, swelling, or asymmetry. No contractures  Inspection: No masses, redness, swelling, or asymmetry. No contractures  Functional ROM: Unrestricted ROM          Functional ROM: Unrestricted ROM          Muscle strength & Tone: Functionally intact  Muscle strength & Tone: Functionally intact  Sensory: Unimpaired  Sensory: Unimpaired  Palpation: No palpable anomalies  Palpation: No palpable anomalies  Specialized Test(s): Deferred         Specialized Test(s): Deferred          Thoracic Spine Exam  Inspection: No masses, redness, or swelling Alignment: Symmetrical Functional ROM: Unrestricted ROM Stability: No instability detected Sensory: Unimpaired Muscle strength & Tone: No palpable anomalies  Lumbar Spine Exam  Inspection: No masses, redness, or  swelling Alignment: Symmetrical Functional ROM: Unrestricted ROM Stability: No instability detected Muscle strength & Tone: Functionally intact Sensory: Unimpaired Palpation: No palpable anomalies Provocative Tests: Lumbar Hyperextension and rotation test: evaluation deferred today       Patrick's Maneuver: evaluation deferred today              Gait & Posture Assessment  Ambulation: Unassisted Gait: Relatively normal for age and body habitus Posture: WNL   Lower Extremity Exam    Side: Right lower extremity  Side: Left lower extremity  Inspection: No masses, redness, swelling, or asymmetry. No contractures  Inspection: No masses, redness, swelling, or asymmetry. No contractures  Functional ROM:  Unrestricted ROM          Functional ROM: Unrestricted ROM          Muscle strength & Tone: Functionally intact  Muscle strength & Tone: Functionally intact  Sensory: Unimpaired  Sensory: Unimpaired  Palpation: No palpable anomalies  Palpation: No palpable anomalies   Assessment  Primary Diagnosis & Pertinent Problem List: The primary encounter diagnosis was Chronic low back pain  (Location of Primary Pain) (Bilateral) (L>R). Diagnoses of Chronic hip pain (Location of Secondary source of pain) (Bilateral) (L>R), Chronic lower extremity pain (Location of Tertiary source of pain) (Bilateral) (L>R), Long term prescription opiate use, Opiate use (45 MME/Day), Musculoskeletal pain, Chronic pain syndrome, Chronic sacroiliac joint pain (Bilateral) (L>R), Lumbar facet syndrome (Bilateral) (L>R), and Lumbar spondylosis were also pertinent to this visit.  Status Diagnosis  Controlled Controlled Controlled 1. Chronic low back pain  (Location of Primary Pain) (Bilateral) (L>R)   2. Chronic hip pain (Location of Secondary source of pain) (Bilateral) (L>R)   3. Chronic lower extremity pain (Location of Tertiary source of pain) (Bilateral) (L>R)   4. Long term prescription opiate use   5. Opiate use (45  MME/Day)   6. Musculoskeletal pain   7. Chronic pain syndrome   8. Chronic sacroiliac joint pain (Bilateral) (L>R)   9. Lumbar facet syndrome (Bilateral) (L>R)   10. Lumbar spondylosis      Plan of Care  Pharmacotherapy (Medications Ordered): Meds ordered this encounter  Medications  . cyclobenzaprine (FLEXERIL) 10 MG tablet    Sig: Take 1 tablet (10 mg total) by mouth 3 (three) times daily as needed for muscle spasms.    Dispense:  90 tablet    Refill:  2    Do not place this medication, or any other prescription from our practice, on "Automatic Refill". Patient may have prescription filled one day early if pharmacy is closed on scheduled refill date.  Marland Kitchen oxyCODONE (OXY IR/ROXICODONE) 5 MG immediate release tablet    Sig: Take 1 tablet (5 mg total) by mouth every 8 (eight) hours as needed for severe pain.    Dispense:  90 tablet    Refill:  0    Do not add this medication to the electronic "Automatic Refill" notification system. Patient may have prescription filled one day early if pharmacy is closed on scheduled refill date. Do not fill until: 02/02/17 To last until: 03/04/17  . oxyCODONE (OXY IR/ROXICODONE) 5 MG immediate release tablet    Sig: Take 1 tablet (5 mg total) by mouth every 8 (eight) hours as needed for severe pain.    Dispense:  90 tablet    Refill:  0    Do not add this medication to the electronic "Automatic Refill" notification system. Patient may have prescription filled one day early if pharmacy is closed on scheduled refill date. Do not fill until: 01/03/17 To last until: 02/02/17  . oxyCODONE (OXY IR/ROXICODONE) 5 MG immediate release tablet    Sig: Take 1 tablet (5 mg total) by mouth every 8 (eight) hours as needed for severe pain.    Dispense:  90 tablet    Refill:  0    Do not add this medication to the electronic "Automatic Refill" notification system. Patient may have prescription filled one day early if pharmacy is closed on scheduled refill date. Do  not fill until: 03/04/17 To last until: 04/03/17   New Prescriptions   No medications on file   Medications administered today: Mr. Todorov had no  medications administered during this visit. Lab-work, procedure(s), and/or referral(s): Orders Placed This Encounter  Procedures  . LUMBAR FACET(MEDIAL BRANCH NERVE BLOCK) MBNB  . SACROILIAC JOINT INJECTINS  . ToxASSURE Select 13 (MW), Urine  . Ambulatory referral to Physical Therapy   Imaging and/or referral(s): AMB REFERRAL TO PHYSICAL THERAPY  Interventional therapies: Planned, scheduled, and/or pending:   Bilateral Lumbar Facet and SI block PT for LBP   Considering:   Left cervical epidural steroid injection #2 under fluoroscopic guidance  Left sided lumbar facet + sacroiliac joint radiofrequency ablation under fluoroscopic guidance and IV sedation.  Right-sided lumbar facet + sacroiliac joint radiofrequency ablation under fluoroscopic guidance and IV sedation. We will plan to do this too is to 6 weeks after the left side.  Diagnostic Bilateral Hip Block Possible Bilateral Hip RFA. Diagnostic bilateral cervical facet block. Possible bilateral cervical facet RFA. Diagnostic Caudal ESI. Diagnostic Left CESI. Diagnostic Right Knee injection. Diagnostic right Genicular NB Possible right Genicular Nerve RFA. Diagnostic bilateral intra-articular shoulder inj. Diagnostic bilateral suprascapular NB. Possible bilateral suprascapular Nerve RFA.   Palliative PRN treatment(s):   Diagnostic Bilateral Lumbar Facet & Bilateral S-I joint block under fluoroscopy and IV sedation. Diagnostic Bilateral Hip Block Diagnostic bilateral cervical facet block. Diagnostic Caudal ESI. Diagnostic Left CESI. Diagnostic Right Knee injection. Diagnostic right Genicular NB Diagnostic bilateral intra-articular shoulder inj. Diagnostic bilateral suprascapular NB.   Provider-requested follow-up: Return in about 3 months (around 03/17/2017) for (Nurse  Practitioner) Med-Mgmt, in addition, (PRN) procedure.  Future Appointments Date Time Provider [place]  03/17/2017 1:00 PM Kunkle, NP [facility] None   Primary Care Physician: Sofie Hartigan, MD Location: [facility] Note by: Kathlen Brunswick. Dossie Arbour, M.D, DABA, DABAPM, DABPM, DABIPP, FIPP Date: 12/16/2016; Time: 12:25 PM  Pain Score Disclaimer: We use the NRS-11 scale. This is a self-reported, subjective measurement of pain severity with only modest accuracy. It is used primarily to identify changes within a particular patient. It must be understood that outpatient pain scales are significantly less accurate that those used for research, where they can be applied under ideal controlled circumstances with minimal exposure to variables. In reality, the score is likely to be a combination of pain intensity and pain affect, where pain affect describes the degree of emotional arousal or changes in action readiness caused by the sensory experience of pain. Factors such as social and work situation, setting, emotional state, anxiety levels, expectation, and prior pain experience may influence pain perception and show large inter-individual differences that may also be affected by time variables.  Patient instructions provided during this appointment: Patient Instructions   Pain Score  Introduction: The pain score used by this practice is the Verbal Numerical Rating Scale (VNRS-11). This is an 11-point scale. It is for adults and children 10 years or older. There are significant differences in how the pain score is reported, used, and applied. Forget everything you learned in the past and learn this scoring system.  General Information: The scale should reflect your current level of pain. Unless you are specifically asked for the level of your worst pain, or your average pain. If you are asked for one of these two, then it should be understood that it is over the  past 24 hours.  Basic Activities of Daily Living (ADL): Personal hygiene, dressing, eating, transferring, and using restroom.  Instructions: Most patients tend to report their level of pain as a combination of two factors, their physical pain and their psychosocial pain. This  last one is also known as "suffering" and it is reflection of how physical pain affects you socially and psychologically. From now on, report them separately. From this point on, when asked to report your pain level, report only your physical pain. Use the following table for reference.  Pain Clinic Pain Levels (0-5/10)  Pain Level Score Description  No Pain 0   Mild pain 1 Nagging, annoying, but does not interfere with basic activities of daily living (ADL). Patients are able to eat, bathe, get dressed, toileting (being able to get on and off the toilet and perform personal hygiene functions), transfer (move in and out of bed or a chair without assistance), and maintain continence (able to control bladder and bowel functions). Blood pressure and heart rate are unaffected. A normal heart rate for a healthy adult ranges from 60 to 100 bpm (beats per minute).   Mild to moderate pain 2 Noticeable and distracting. Impossible to hide from other people. More frequent flare-ups. Still possible to adapt and function close to normal. It can be very annoying and may have occasional stronger flare-ups. With discipline, patients may get used to it and adapt.   Moderate pain 3 Interferes significantly with activities of daily living (ADL). It becomes difficult to feed, bathe, get dressed, get on and off the toilet or to perform personal hygiene functions. Difficult to get in and out of bed or a chair without assistance. Very distracting. With effort, it can be ignored when deeply involved in activities.   Moderately severe pain 4 Impossible to ignore for more than a few minutes. With effort, patients may still be able to manage work or  participate in some social activities. Very difficult to concentrate. Signs of autonomic nervous system discharge are evident: dilated pupils (mydriasis); mild sweating (diaphoresis); sleep interference. Heart rate becomes elevated (>115 bpm). Diastolic blood pressure (lower number) rises above 100 mmHg. Patients find relief in laying down and not moving.   Severe pain 5 Intense and extremely unpleasant. Associated with frowning face and frequent crying. Pain overwhelms the senses.  Ability to do any activity or maintain social relationships becomes significantly limited. Conversation becomes difficult. Pacing back and forth is common, as getting into a comfortable position is nearly impossible. Pain wakes you up from deep sleep. Physical signs will be obvious: pupillary dilation; increased sweating; goosebumps; brisk reflexes; cold, clammy hands and feet; nausea, vomiting or dry heaves; loss of appetite; significant sleep disturbance with inability to fall asleep or to remain asleep. When persistent, significant weight loss is observed due to the complete loss of appetite and sleep deprivation.  Blood pressure and heart rate becomes significantly elevated. Caution: If elevated blood pressure triggers a pounding headache associated with blurred vision, then the patient should immediately seek attention at an urgent or emergency care unit, as these may be signs of an impending stroke.    Emergency Department Pain Levels (6-10/10)  Emergency Room Pain 6 Severely limiting. Requires emergency care and should not be seen or managed at an outpatient pain management facility. Communication becomes difficult and requires great effort. Assistance to reach the emergency department may be required. Facial flushing and profuse sweating along with potentially dangerous increases in heart rate and blood pressure will be evident.   Distressing pain 7 Self-care is very difficult. Assistance is required to transport, or  use restroom. Assistance to reach the emergency department will be required. Tasks requiring coordination, such as bathing and getting dressed become very difficult.   Disabling pain  8 Self-care is no longer possible. At this level, pain is disabling. The individual is unable to do even the most "basic" activities such as walking, eating, bathing, dressing, transferring to a bed, or toileting. Fine motor skills are lost. It is difficult to think clearly.   Incapacitating pain 9 Pain becomes incapacitating. Thought processing is no longer possible. Difficult to remember your own name. Control of movement and coordination are lost.   The worst pain imaginable 10 At this level, most patients pass out from pain. When this level is reached, collapse of the autonomic nervous system occurs, leading to a sudden drop in blood pressure and heart rate. This in turn results in a temporary and dramatic drop in blood flow to the brain, leading to a loss of consciousness. Fainting is one of the body's self defense mechanisms. Passing out puts the brain in a calmed state and causes it to shut down for a while, in order to begin the healing process.    Summary: 1. Refer to this scale when providing [place] with your pain level. 2. Be accurate and careful when reporting your pain level. This will help with your care. 3. Over-reporting your pain level will lead to loss of credibility. 4. Even a level of 1/10 means that there is pain and will be treated at our facility. 5. High, inaccurate reporting will be documented as "Symptom Exaggeration", leading to loss of credibility and suspicions of possible secondary gains such as obtaining more narcotics, or wanting to appear disabled, for fraudulent reasons. 6. Only pain levels of 5 or below will be seen at our facility. 7. Pain levels of 6 and above will be sent to the Emergency Department and the appointment  cancelled. _____________________________________________________________________________________________  Preparing for Procedure with Sedation Instructions: . Oral Intake: Do not eat or drink anything for at least 8 hours prior to your procedure. . Transportation: Public transportation is not allowed. Bring an adult driver. The driver must be physically present in our waiting room before any procedure can be started. Marland Kitchen Physical Assistance: Bring an adult physically capable of assisting you, in the event you need help. This adult should keep you company at home for at least 6 hours after the procedure. . Blood Pressure Medicine: Take your blood pressure medicine with a sip of water the morning of the procedure. . Blood thinners:  . Diabetics on insulin: Notify the staff so that you can be scheduled 1st case in the morning. If your diabetes requires high dose insulin, take only  of your normal insulin dose the morning of the procedure and notify the staff that you have done so. . Preventing infections: Shower with an antibacterial soap the morning of your procedure. . Build-up your immune system: Take 1000 mg of Vitamin C with every meal (3 times a day) the day prior to your procedure. Marland Kitchen Antibiotics: Inform the staff if you have a condition or reason that requires you to take antibiotics before dental procedures. . Pregnancy: If you are pregnant, call and cancel the procedure. . Sickness: If you have a cold, fever, or any active infections, call and cancel the procedure. . Arrival: You must be in the facility at least 30 minutes prior to your scheduled procedure. . Children: Do not bring children with you. . Dress appropriately: Bring dark clothing that you would not mind if they get stained. . Valuables: Do not bring any jewelry or valuables. Procedure appointments are reserved for interventional treatments only. Marland Kitchen No Prescription Refills. Marland Kitchen  No medication changes will be discussed during  procedure appointments. . No disability issues will be discussed.  ____________________________________________________________________________________________  GENERAL RISKS AND COMPLICATIONS  What are the risk, side effects and possible complications? Generally speaking, most procedures are safe.  However, with any procedure there are risks, side effects, and the possibility of complications.  The risks and complications are dependent upon the sites that are lesioned, or the type of nerve block to be performed.  The closer the procedure is to the spine, the more serious the risks are.  Great care is taken when placing the radio frequency needles, block needles or lesioning probes, but sometimes complications can occur. 1. Infection: Any time there is an injection through the skin, there is a risk of infection.  This is why sterile conditions are used for these blocks.  There are four possible types of infection. 1. Localized skin infection. 2. Central Nervous System Infection-This can be in the form of Meningitis, which can be deadly. 3. Epidural Infections-This can be in the form of an epidural abscess, which can cause pressure inside of the spine, causing compression of the spinal cord with subsequent paralysis. This would require an emergency surgery to decompress, and there are no guarantees that the patient would recover from the paralysis. 4. Discitis-This is an infection of the intervertebral discs.  It occurs in about 1% of discography procedures.  It is difficult to treat and it may lead to surgery.        2. Pain: the needles have to go through skin and soft tissues, will cause soreness.       3. Damage to internal structures:  The nerves to be lesioned may be near blood vessels or    other nerves which can be potentially damaged.       4. Bleeding: Bleeding is more common if the patient is taking blood thinners such as  aspirin, Coumadin, Ticiid, Plavix, etc., or if he/she have some  genetic predisposition  such as hemophilia. Bleeding into the spinal canal can cause compression of the spinal  cord with subsequent paralysis.  This would require an emergency surgery to  decompress and there are no guarantees that the patient would recover from the  paralysis.       5. Pneumothorax:  Puncturing of a lung is a possibility, every time a needle is introduced in  the area of the chest or upper back.  Pneumothorax refers to free air around the  collapsed lung(s), inside of the thoracic cavity (chest cavity).  Another two possible  complications related to a similar event would include: Hemothorax and Chylothorax.   These are variations of the Pneumothorax, where instead of air around the collapsed  lung(s), you may have blood or chyle, respectively.       6. Spinal headaches: They may occur with any procedures in the area of the spine.       7. Persistent CSF (Cerebro-Spinal Fluid) leakage: This is a rare problem, but may occur  with prolonged intrathecal or epidural catheters either due to the formation of a fistulous  track or a dural tear.       8. Nerve damage: By working so close to the spinal cord, there is always a possibility of  nerve damage, which could be as serious as a permanent spinal cord injury with  paralysis.       9. Death:  Although rare, severe deadly allergic reactions known as "Anaphylactic  reaction" can occur to any of  the medications used.      10. Worsening of the symptoms:  We can always make thing worse.  What are the chances of something like this happening? Chances of any of this occuring are extremely low.  By statistics, you have more of a chance of getting killed in a motor vehicle accident: while driving to the hospital than any of the above occurring .  Nevertheless, you should be aware that they are possibilities.  In general, it is similar to taking a shower.  Everybody knows that you can slip, hit your head and get killed.  Does that mean that you should  not shower again?  Nevertheless always keep in mind that statistics do not mean anything if you happen to be on the wrong side of them.  Even if a procedure has a 1 (one) in a 1,000,000 (million) chance of going wrong, it you happen to be that one..Also, keep in mind that by statistics, you have more of a chance of having something go wrong when taking medications.  Who should not have this procedure? If you are on a blood thinning medication (e.g. Coumadin, Plavix, see list of "Blood Thinners"), or if you have an active infection going on, you should not have the procedure.  If you are taking any blood thinners, please inform your physician.  How should I prepare for this procedure?  Do not eat or drink anything at least six hours prior to the procedure.  Bring a driver with you .  It cannot be a taxi.  Come accompanied by an adult that can drive you back, and that is strong enough to help you if your legs get weak or numb from the local anesthetic.  Take all of your medicines the morning of the procedure with just enough water to swallow them.  If you have diabetes, make sure that you are scheduled to have your procedure done first thing in the morning, whenever possible.  If you have diabetes, take only half of your insulin dose and notify our nurse that you have done so as soon as you arrive at the clinic.  If you are diabetic, but only take blood sugar pills (oral hypoglycemic), then do not take them on the morning of your procedure.  You may take them after you have had the procedure.  Do not take aspirin or any aspirin-containing medications, at least eleven (11) days prior to the procedure.  They may prolong bleeding.  Wear loose fitting clothing that may be easy to take off and that you would not mind if it got stained with Betadine or blood.  Do not wear any jewelry or perfume  Remove any nail coloring.  It will interfere with some of our monitoring equipment.  NOTE: Remember  that this is not meant to be interpreted as a complete list of all possible complications.  Unforeseen problems may occur.  BLOOD THINNERS The following drugs contain aspirin or other products, which can cause increased bleeding during surgery and should not be taken for 2 weeks prior to and 1 week after surgery.  If you should need take something for relief of minor pain, you may take acetaminophen which is found in Tylenol,m Datril, Anacin-3 and Panadol. It is not blood thinner. The products listed below are.  Do not take any of the products listed below in addition to any listed on your instruction sheet.  A.P.C or A.P.C with Codeine Codeine Phosphate Capsules #3 Ibuprofen Ridaura  ABC compound Congesprin  Imuran rimadil  Advil Cope Indocin Robaxisal  Alka-Seltzer Effervescent Pain Reliever and Antacid Coricidin or Coricidin-D  Indomethacin Rufen  Alka-Seltzer plus Cold Medicine Cosprin Ketoprofen S-A-C Tablets  Anacin Analgesic Tablets or Capsules Coumadin Korlgesic Salflex  Anacin Extra Strength Analgesic tablets or capsules CP-2 Tablets Lanoril Salicylate  Anaprox Cuprimine Capsules Levenox Salocol  Anexsia-D Dalteparin Magan Salsalate  Anodynos Darvon compound Magnesium Salicylate Sine-off  Ansaid Dasin Capsules Magsal Sodium Salicylate  Anturane Depen Capsules Marnal Soma  APF Arthritis pain formula Dewitt's Pills Measurin Stanback  Argesic Dia-Gesic Meclofenamic Sulfinpyrazone  Arthritis Bayer Timed Release Aspirin Diclofenac Meclomen Sulindac  Arthritis pain formula Anacin Dicumarol Medipren Supac  Analgesic (Safety coated) Arthralgen Diffunasal Mefanamic Suprofen  Arthritis Strength Bufferin Dihydrocodeine Mepro Compound Suprol  Arthropan liquid Dopirydamole Methcarbomol with Aspirin Synalgos  ASA tablets/Enseals Disalcid Micrainin Tagament  Ascriptin Doan's Midol Talwin  Ascriptin A/D Dolene Mobidin Tanderil  Ascriptin Extra Strength Dolobid Moblgesic Ticlid  Ascriptin with  Codeine Doloprin or Doloprin with Codeine Momentum Tolectin  Asperbuf Duoprin Mono-gesic Trendar  Aspergum Duradyne Motrin or Motrin IB Triminicin  Aspirin plain, buffered or enteric coated Durasal Myochrisine Trigesic  Aspirin Suppositories Easprin Nalfon Trillsate  Aspirin with Codeine Ecotrin Regular or Extra Strength Naprosyn Uracel  Atromid-S Efficin Naproxen Ursinus  Auranofin Capsules Elmiron Neocylate Vanquish  Axotal Emagrin Norgesic Verin  Azathioprine Empirin or Empirin with Codeine Normiflo Vitamin E  Azolid Emprazil Nuprin Voltaren  Bayer Aspirin plain, buffered or children's or timed BC Tablets or powders Encaprin Orgaran Warfarin Sodium  Buff-a-Comp Enoxaparin Orudis Zorpin  Buff-a-Comp with Codeine Equegesic Os-Cal-Gesic   Buffaprin Excedrin plain, buffered or Extra Strength Oxalid   Bufferin Arthritis Strength Feldene Oxphenbutazone   Bufferin plain or Extra Strength Feldene Capsules Oxycodone with Aspirin   Bufferin with Codeine Fenoprofen Fenoprofen Pabalate or Pabalate-SF   Buffets II Flogesic Panagesic   Buffinol plain or Extra Strength Florinal or Florinal with Codeine Panwarfarin   Buf-Tabs Flurbiprofen Penicillamine   Butalbital Compound Four-way cold tablets Penicillin   Butazolidin Fragmin Pepto-Bismol   Carbenicillin Geminisyn Percodan   Carna Arthritis Reliever Geopen Persantine   Carprofen Gold's salt Persistin   Chloramphenicol Goody's Phenylbutazone   Chloromycetin Haltrain Piroxlcam   Clmetidine heparin Plaquenil   Cllnoril Hyco-pap Ponstel   Clofibrate Hydroxy chloroquine Propoxyphen         Before stopping any of these medications, be sure to consult the physician who ordered them.  Some, such as Coumadin (Warfarin) are ordered to prevent or treat serious conditions such as "deep thrombosis", "pumonary embolisms", and other heart problems.  The amount of time that you may need off of the medication may also vary with the medication and the reason for  which you were taking it.  If you are taking any of these medications, please make sure you notify your pain physician before you undergo any procedures.          Facet Joint Block The facet joints connect the bones of the spine (vertebrae). They make it possible for you to bend, twist, and make other movements with your spine. They also keep you from bending too far, twisting too far, and making other excessive movements. A facet joint block is a procedure where a numbing medicine (anesthetic) is injected into a facet joint. Often, a type of anti-inflammatory medicine called a steroid is also injected. A facet joint block may be done to diagnose neck or back pain. If the pain gets better after a facet joint block, it means the pain  is probably coming from the facet joint. If the pain does not get better, it means the pain is probably not coming from the facet joint. A facet joint block may also be done to relieve neck or back pain caused by an inflamed facet joint. A facet joint block is only done to relieve pain if the pain does not improve with other methods, such as medicine, exercise programs, and physical therapy. Tell a health care provider about:  Any allergies you have.  All medicines you are taking, including vitamins, herbs, eye drops, creams, and over-the-counter medicines.  Any problems you or family members have had with anesthetic medicines.  Any blood disorders you have.  Any surgeries you have had.  Any medical conditions you have.  Whether you are pregnant or may be pregnant. What are the risks? Generally, this is a safe procedure. However, problems may occur, including:  Bleeding.  Injury to a nerve near the injection site.  Pain at the injection site.  Weakness or numbness in areas controlled by nerves near the injection site.  Infection.  Temporary fluid retention.  Allergic reactions to medicines or dyes.  Injury to other structures or organs near  the injection site. What happens before the procedure?  Follow instructions from your health care provider about eating or drinking restrictions.  Ask your health care provider about:  Changing or stopping your regular medicines. This is especially important if you are taking diabetes medicines or blood thinners.  Taking medicines such as aspirin and ibuprofen. These medicines can thin your blood. Do not take these medicines before your procedure if your health care provider instructs you not to.  Do not take any new dietary supplements or medicines without asking your health care provider first.  Plan to have someone take you home after the procedure. What happens during the procedure?  You may need to remove your clothing and dress in an open-back gown.  The procedure will be done while you are lying on an X-ray table. You will most likely be asked to lie on your stomach, but you may be asked to lie in a different position if an injection will be made in your neck.  Machines will be used to monitor your oxygen levels, heart rate, and blood pressure.  If an injection will be made in your neck, an IV tube will be inserted into one of your veins. Fluids and medicine will flow directly into your body through the IV tube.  The area over the facet joint where the injection will be made will be cleaned with soap. The surrounding skin will be covered with clean drapes.  A numbing medicine (local anesthetic) will be applied to your skin. Your skin may sting or burn for a moment.  A video X-ray machine (fluoroscopy) will be used to locate the joint. In some cases, a CT scan may be used.  A contrast dye may be injected into the facet joint area to help locate the joint.  When the joint is located, an anesthetic will be injected into the joint through the needle.  Your health care provider will ask you whether you feel pain relief. If you do feel relief, a steroid may be injected to provide  pain relief for a longer period of time. If you do not feel relief or feel only partial relief, additional injections of an anesthetic may be made in other facet joints.  The needle will be removed.  Your skin will be cleaned.  A  bandage (dressing) will be applied over each injection site. The procedure may vary among health care providers and hospitals. What happens after the procedure?  You will be observed for 15-30 minutes before being allowed to go home. This information is not intended to replace advice given to you by your health care provider. Make sure you discuss any questions you have with your health care provider. Document Released: 01/19/2007 Document Revised: 10/01/2015 Document Reviewed: 05/26/2015 [employer]vier Interactive Patient Education  2017 [employer].

## 2016-12-23 LAB — TOXASSURE SELECT 13 (MW), URINE

## 2016-12-27 ENCOUNTER — Ambulatory Visit: Payer: BLUE CROSS/BLUE SHIELD | Admitting: Pain Medicine

## 2016-12-29 ENCOUNTER — Ambulatory Visit: Payer: BLUE CROSS/BLUE SHIELD | Admitting: Pain Medicine

## 2017-01-03 ENCOUNTER — Encounter: Payer: Self-pay | Admitting: Pain Medicine

## 2017-01-03 ENCOUNTER — Ambulatory Visit
Admission: RE | Admit: 2017-01-03 | Discharge: 2017-01-03 | Disposition: A | Discharge status: Home / Self Care | Payer: BLUE CROSS/BLUE SHIELD | Source: Ambulatory Visit | Attending: Pain Medicine | Admitting: Pain Medicine

## 2017-01-03 ENCOUNTER — Ambulatory Visit (HOSPITAL_BASED_OUTPATIENT_CLINIC_OR_DEPARTMENT_OTHER): Payer: BLUE CROSS/BLUE SHIELD | Admitting: Pain Medicine

## 2017-01-03 VITALS — BP 118/68 | HR 81 | Temp 97.7°F | Resp 14 | Ht 69.0 in | Wt 185.0 lb

## 2017-01-03 DIAGNOSIS — M533 Sacrococcygeal disorders, not elsewhere classified: Secondary | ICD-10-CM

## 2017-01-03 DIAGNOSIS — M545 Low back pain: Secondary | ICD-10-CM | POA: Diagnosis present

## 2017-01-03 DIAGNOSIS — M47816 Spondylosis without myelopathy or radiculopathy, lumbar region: Secondary | ICD-10-CM

## 2017-01-03 DIAGNOSIS — M5442 Lumbago with sciatica, left side: Secondary | ICD-10-CM

## 2017-01-03 DIAGNOSIS — G8929 Other chronic pain: Secondary | ICD-10-CM | POA: Diagnosis not present

## 2017-01-03 DIAGNOSIS — M4696 Unspecified inflammatory spondylopathy, lumbar region: Secondary | ICD-10-CM

## 2017-01-03 DIAGNOSIS — M5441 Lumbago with sciatica, right side: Secondary | ICD-10-CM

## 2017-01-03 MED ORDER — LACTATED RINGERS IV SOLN
1000.0000 mL | Freq: Once | INTRAVENOUS | Status: AC
  Administered 2017-01-03: 1000 mL via INTRAVENOUS

## 2017-01-03 MED ORDER — ROPIVACAINE HCL 2 MG/ML IJ SOLN
9.0000 mL | Freq: Once | INTRAMUSCULAR | Status: AC
  Administered 2017-01-03: 9 mL via PERINEURAL
  Filled 2017-01-03: qty 10

## 2017-01-03 MED ORDER — TRIAMCINOLONE ACETONIDE 40 MG/ML IJ SUSP
40.0000 mg | Freq: Once | INTRAMUSCULAR | Status: AC
  Administered 2017-01-03: 80 mg

## 2017-01-03 MED ORDER — ROPIVACAINE HCL 2 MG/ML IJ SOLN
9.0000 mL | Freq: Once | INTRAMUSCULAR | Status: AC
  Administered 2017-01-03: 9 mL via PERINEURAL

## 2017-01-03 MED ORDER — LIDOCAINE HCL (PF) 1 % IJ SOLN: 10.0000 mL | Freq: Once | INTRAMUSCULAR | Status: DC

## 2017-01-03 MED ORDER — MIDAZOLAM HCL 5 MG/5ML IJ SOLN
INTRAMUSCULAR | Status: AC
  Filled 2017-01-03: qty 5

## 2017-01-03 MED ORDER — MIDAZOLAM HCL 5 MG/5ML IJ SOLN
1.0000 mg | INTRAMUSCULAR | Status: DC | PRN
  Administered 2017-01-03: 2 mg via INTRAVENOUS

## 2017-01-03 MED ORDER — ROPIVACAINE HCL 5 MG/ML IJ SOLN
9.0000 mL | Freq: Once | INTRAMUSCULAR | Status: DC
  Filled 2017-01-03: qty 9

## 2017-01-03 MED ORDER — ROPIVACAINE HCL 2 MG/ML IJ SOLN
INTRAMUSCULAR | Status: AC
  Filled 2017-01-03: qty 20

## 2017-01-03 MED ORDER — TRIAMCINOLONE ACETONIDE 40 MG/ML IJ SUSP
40.0000 mg | Freq: Once | INTRAMUSCULAR | Status: AC
  Administered 2017-01-03: 40 mg

## 2017-01-03 MED ORDER — TRIAMCINOLONE ACETONIDE 40 MG/ML IJ SUSP
INTRAMUSCULAR | Status: AC
  Filled 2017-01-03: qty 2

## 2017-01-03 MED ORDER — METHYLPREDNISOLONE ACETATE 80 MG/ML IJ SUSP
80.0000 mg | Freq: Once | INTRAMUSCULAR | Status: AC
  Administered 2017-01-03: 80 mg via INTRA_ARTICULAR
  Filled 2017-01-03: qty 1

## 2017-01-03 MED ORDER — FENTANYL CITRATE (PF) 100 MCG/2ML IJ SOLN
25.0000 ug | INTRAMUSCULAR | Status: DC | PRN
Start: 2017-01-03 — End: 2017-01-03
  Administered 2017-01-03: 50 ug via INTRAVENOUS

## 2017-01-03 MED ORDER — FENTANYL CITRATE (PF) 100 MCG/2ML IJ SOLN
INTRAMUSCULAR | Status: AC
  Filled 2017-01-03: qty 2

## 2017-01-03 NOTE — Progress Notes (Signed)
Safety precautions to be maintained throughout the outpatient stay will include: orient to surroundings, keep bed in low position, maintain call bell within reach at all times, provide assistance with transfer out of bed and ambulation.  

## 2017-01-03 NOTE — Progress Notes (Signed)
Patient's Name: Ricky Mercer  MRN: [id number]  Referring Provider: Milinda Pointer, MD  DOB: Aug 23, 1951  PCP: Sofie Hartigan, MD  DOS: 01/03/2017  Note by: Kathlen Brunswick. Dossie Arbour, MD  Service setting: Ambulatory outpatient  Location: [facility] (AMB) Pain Management Facility  Visit type: Procedure  Specialty: Interventional Pain Management  Patient type: Established   Primary Reason for Visit: Interventional Pain Management Treatment. CC: Back Pain (low)  Procedure:  Anesthesia, Analgesia, Anxiolysis:  Procedure #1: Type: Diagnostic Medial Branch Facet Block Region: Lumbar Level: L2, L3, L4, L5, & S1 Medial Branch Level(s) Laterality: Bilateral  Procedure #2: Type: Diagnostic Sacroiliac Joint Block Region: Posterior Lumbosacral Level: PSIS (Posterior Superior Iliac Spine) Sacroiliac Joint Laterality: Bilateral  Type: Local Anesthesia with Moderate (Conscious) Sedation Local Anesthetic: Lidocaine 1% Route: Intravenous (IV) IV Access: Secured Sedation: Meaningful verbal contact was maintained at all times during the procedure  Indication(s): Analgesia and Anxiety  Indications: 1. Lumbar facet syndrome (Bilateral) (L>R)   2. Chronic low back pain  (Location of Primary Pain) (Bilateral) (L>R)   3. Chronic sacroiliac joint pain (Bilateral) (L>R)   4. Lumbar spondylosis    Pain Score: Pre-procedure: 5 /10 Post-procedure: 2 /10  Pre-op Assessment:  Previous date of service: 12/16/16 Service provided: Med Refill, Evaluation Mr. Meleski is a 66 y.o. (year old), male patient, seen today for interventional treatment. He  has a past surgical history that includes Nose surgery; Hand surgery (Bilateral); Colonoscopy (10/24/2012); Upper gi endoscopy (12/18/2012); Tonsillectomy (2009); Skin cancer excision (Left, 2015); Mass excision (Right, 07/18/2015); Esophagogastroduodenoscopy (egd) with propofol (N/A, 10/28/2015); prostate seeding; Hernia repair; and Cholecystectomy (N/A, 12/02/2015). His  primarily concern today is the Back Pain (low)  Initial Vital Signs: Blood pressure 134/85, pulse 81, temperature 97.2 F (36.2 C), resp. rate 18, height 5\' 9"  (1.753 m), weight 185 lb (83.9 kg), SpO2 100 %. BMI: 27.32 kg/m  Risk Assessment: Allergies: Reviewed. He has No Known Allergies.  Allergy Precautions: None required Coagulopathies: "Reviewed. None identified.  Blood-thinner therapy: None at this time Active Infection(s): Reviewed. None identified. Mr. Barlowe is afebrile  Site Confirmation: Mr. Hinton was asked to confirm the procedure and laterality before marking the site Procedure checklist: Completed Consent: Before the procedure and under the influence of no sedative(s), amnesic(s), or anxiolytics, the patient was informed of the treatment options, risks and possible complications. To fulfill our ethical and legal obligations, as recommended by the American Medical Association's Code of Ethics, I have informed the patient of my clinical impression; the nature and purpose of the treatment or procedure; the risks, benefits, and possible complications of the intervention; the alternatives, including doing nothing; the risk(s) and benefit(s) of the alternative treatment(s) or procedure(s); and the risk(s) and benefit(s) of doing nothing. The patient was provided information about the general risks and possible complications associated with the procedure. These may include, but are not limited to: failure to achieve desired goals, infection, bleeding, organ or nerve damage, allergic reactions, paralysis, and death. In addition, the patient was informed of those risks and complications associated to Spine-related procedures, such as failure to decrease pain; infection (i.e.: Meningitis, epidural or intraspinal abscess); bleeding (i.e.: epidural hematoma, subarachnoid hemorrhage, or any other type of intraspinal or peri-dural bleeding); organ or nerve damage (i.e.: Any type of peripheral  nerve, nerve root, or spinal cord injury) with subsequent damage to sensory, motor, and/or autonomic systems, resulting in permanent pain, numbness, and/or weakness of one or several areas of the body; allergic reactions; (i.e.: anaphylactic reaction); and/or death.  Furthermore, the patient was informed of those risks and complications associated with the medications. These include, but are not limited to: allergic reactions (i.e.: anaphylactic or anaphylactoid reaction(s)); adrenal axis suppression; blood sugar elevation that in diabetics may result in ketoacidosis or comma; water retention that in patients with history of congestive heart failure may result in shortness of breath, pulmonary edema, and decompensation with resultant heart failure; weight gain; swelling or edema; medication-induced neural toxicity; particulate matter embolism and blood vessel occlusion with resultant organ, and/or nervous system infarction; and/or aseptic necrosis of one or more joints. Finally, the patient was informed that Medicine is not an exact science; therefore, there is also the possibility of unforeseen or unpredictable risks and/or possible complications that may result in a catastrophic outcome. The patient indicated having understood very clearly. We have given the patient no guarantees and we have made no promises. Enough time was given to the patient to ask questions, all of which were answered to the patient's satisfaction. Mr. Biello has indicated that he wanted to continue with the procedure. Attestation: I, the ordering provider, attest that I have discussed with the patient the benefits, risks, side-effects, alternatives, likelihood of achieving goals, and potential problems during recovery for the procedure that I have provided informed consent. Date: 01/03/2017; Time: 8:55 AM  Pre-Procedure Preparation:  Monitoring: As per clinic protocol. Respiration, ETCO2, SpO2, BP, heart rate and rhythm monitor placed  and checked for adequate function Safety Precautions: Patient was assessed for positional comfort and pressure points before starting the procedure. Time-out: I initiated and conducted the "Time-out" before starting the procedure, as per protocol. The patient was asked to participate by confirming the accuracy of the "Time Out" information. Verification of the correct person, site, and procedure were performed and confirmed by me, the nursing staff, and the patient. "Time-out" conducted as per Joint Commission's Universal Protocol (UP.01.01.01). "Time-out" Date & Time: 01/03/2017; 0918 hrs.  Description of Procedure #1 Process:   Time-out: "Time-out" completed before starting procedure, as per protocol. Position: Prone Target Area: For Lumbar Facet blocks, the target is the groove formed by the junction of the transverse process and superior articular process. For the L5 dorsal ramus, the target is the notch between superior articular process and sacral ala. For the S1 dorsal ramus, the target is the superior and lateral edge of the posterior S1 Sacral foramen. Approach: Paramedial approach. Area Prepped: Entire Posterior Lumbosacral Region Prepping solution: ChloraPrep (2% chlorhexidine gluconate and 70% isopropyl alcohol) Safety Precautions: Aspiration looking for blood return was conducted prior to all injections. At no point did we inject any substances, as a needle was being advanced. No attempts were made at seeking any paresthesias. Safe injection practices and needle disposal techniques used. Medications properly checked for expiration dates. SDV (single dose vial) medications used.  Description of the Procedure: Protocol guidelines were followed. The patient was placed in position over the fluoroscopy table. The target area was identified and the area prepped in the usual manner. Skin desensitized using vapocoolant spray. Skin & deeper tissues infiltrated with local anesthetic. Appropriate  amount of time allowed to pass for local anesthetics to take effect. The procedure needle was introduced through the skin, ipsilateral to the reported pain, and advanced to the target area. Employing the "Medial Branch Technique", the needles were advanced to the angle made by the superior and medial portion of the transverse process, and the lateral and inferior portion of the superior articulating process of the targeted vertebral bodies. This area is  known as "Burton's Eye" or the "Eye of the [place] Dog". A procedure needle was introduced through the skin, and this time advanced to the angle made by the superior and medial border of the sacral ala, and the lateral border of the S1 vertebral body. This last needle was later repositioned at the superior and lateral border of the posterior S1 foramen. Negative aspiration confirmed. Solution injected in intermittent fashion, asking for systemic symptoms every 0.5cc of injectate. The needles were then removed and the area cleansed, making sure to leave some of the prepping solution back to take advantage of its long term bactericidal properties. Start Time: 0918 hrs. Materials:  Needle(s) Type: Regular needle Gauge: 22G Length: 3.5-in Medication(s): We administered fentaNYL, lactated ringers, midazolam, methylPREDNISolone acetate, triamcinolone acetonide, ropivacaine (PF) 2 mg/mL (0.2%), triamcinolone acetonide, and ropivacaine (PF) 2 mg/mL (0.2%). Please see chart orders for dosing details.  Description of Procedure # 2 Process:   Position: Prone Target Area: For upper sacroiliac joint block(s), the target is the superior and posterior margin of the sacroiliac joint. Approach: Ipsilateral approach. Area Prepped: Entire Posterior Lumbosacral Region Prepping solution: ChloraPrep (2% chlorhexidine gluconate and 70% isopropyl alcohol) Safety Precautions: Aspiration looking for blood return was conducted prior to all injections. At no point did we inject  any substances, as a needle was being advanced. No attempts were made at seeking any paresthesias. Safe injection practices and needle disposal techniques used. Medications properly checked for expiration dates. SDV (single dose vial) medications used. Description of the Procedure: Protocol guidelines were followed. The patient was placed in position over the fluoroscopy table. The target area was identified and the area prepped in the usual manner. Skin desensitized using vapocoolant spray. Skin & deeper tissues infiltrated with local anesthetic. Appropriate amount of time allowed to pass for local anesthetics to take effect. The procedure needle was advanced under fluoroscopic guidance into the sacroiliac joint until a firm endpoint was obtained. Proper needle placement secured. Negative aspiration confirmed. Solution injected in intermittent fashion, asking for systemic symptoms every 0.5cc of injectate. The needles were then removed and the area cleansed, making sure to leave some of the prepping solution back to take advantage of its long term bactericidal properties. Vitals:   01/03/17 0934 01/03/17 0944 01/03/17 0954 01/03/17 1004  BP: (!) 124/96 119/74 118/68 118/68  Pulse:      Resp: 12 14 14 14   Temp:  97.7 F (36.5 C)    SpO2: 94% 96% 97% 98%  Weight:      Height:        End Time: 0933 hrs. Materials:  Needle(s) Type: Regular needle Gauge: 22G Length: 3.5-in Medication(s): We administered fentaNYL, lactated ringers, midazolam, methylPREDNISolone acetate, triamcinolone acetonide, ropivacaine (PF) 2 mg/mL (0.2%), triamcinolone acetonide, and ropivacaine (PF) 2 mg/mL (0.2%). Please see chart orders for dosing details.  Imaging Guidance (Spinal):  Type of Imaging Technique: Fluoroscopy Guidance (Spinal) Indication(s): Assistance in needle guidance and placement for procedures requiring needle placement in or near specific anatomical locations not easily accessible without such  assistance. Exposure Time: Please see nurses notes. Contrast: None used. Fluoroscopic Guidance: I was personally present during the use of fluoroscopy. "Tunnel Vision Technique" used to obtain the best possible view of the target area. Parallax error corrected before commencing the procedure. "Direction-depth-direction" technique used to introduce the needle under continuous pulsed fluoroscopy. Once target was reached, antero-posterior, oblique, and lateral fluoroscopic projection used confirm needle placement in all planes. Images permanently stored in EMR. Interpretation: No contrast injected.  I personally interpreted the imaging intraoperatively. Adequate needle placement confirmed in multiple planes. Permanent images saved into the patient's record.  Antibiotic Prophylaxis:  Indication(s): None identified Antibiotic given: None  Post-operative Assessment:  EBL: None Complications: No immediate post-treatment complications observed by team, or reported by patient. Note: The patient tolerated the entire procedure well. A repeat set of vitals were taken after the procedure and the patient was kept under observation following institutional policy, for this type of procedure. Post-procedural neurological assessment was performed, showing return to baseline, prior to discharge. The patient was provided with post-procedure discharge instructions, including a section on how to identify potential problems. Should any problems arise concerning this procedure, the patient was given instructions to immediately contact us, at any time, without hesitation. In any case, we plan to contact the patient by telephone for a follow-up status report regarding this interventional procedure. Comments:  No additional relevant information.  Plan of Care  Disposition: Discharge home  Discharge Date & Time: 01/03/2017; 1005 hrs.  Physician-requested Follow-up:  Return for Post-Procedure Eval, (2 wks), by MD.  Future  Appointments Date Time Provider [place]  01/31/2017 1:15 PM Milinda Pointer, MD [facility] None  03/17/2017 1:00 PM [place], NP [facility] None   Medications ordered for procedure: Meds ordered this encounter  Medications  . fentaNYL (SUBLIMAZE) injection 25-50 mcg    Make sure Narcan is available in the pyxis when using this medication. In the event of respiratory depression (RR< 8/min): Titrate NARCAN (naloxone) in increments of 0.1 to 0.2 mg IV at 2-3 minute intervals, until desired degree of reversal.  . lactated ringers infusion 1,000 mL  . midazolam (VERSED) 5 MG/5ML injection 1-2 mg    Make sure Flumazenil is available in the pyxis when using this medication. If oversedation occurs, administer 0.2 mg IV over 15 sec. If after 45 sec no response, administer 0.2 mg again over 1 min; may repeat at 1 min intervals; not to exceed 4 doses (1 mg)  . methylPREDNISolone acetate (DEPO-MEDROL) injection 80 mg  . ropivacaine (PF) 5 mg/mL (0.5%) (NAROPIN) injection 9 mL  . lidocaine (PF) (XYLOCAINE) 1 % injection 10 mL  . triamcinolone acetonide (KENALOG-40) injection 40 mg  . ropivacaine (PF) 2 mg/mL (0.2%) (NAROPIN) injection 9 mL  . triamcinolone acetonide (KENALOG-40) injection 40 mg  . ropivacaine (PF) 2 mg/mL (0.2%) (NAROPIN) injection 9 mL  . lidocaine (PF) (XYLOCAINE) 1 % injection 10 mL   Medications administered: We administered fentaNYL, lactated ringers, midazolam, methylPREDNISolone acetate, triamcinolone acetonide, ropivacaine (PF) 2 mg/mL (0.2%), triamcinolone acetonide, and ropivacaine (PF) 2 mg/mL (0.2%).  See the medical record for exact dosing, route, and time of administration.  Lab-work, Procedure(s), & Referral(s) Ordered: Orders Placed This Encounter  Procedures  . LUMBAR FACET(MEDIAL BRANCH NERVE BLOCK) MBNB  . DG C-Arm 1-60 Min-No Report  . Informed Consent Details: Transcribe to consent form and obtain patient signature  . Provider attestation of  informed consent for procedure/surgical case  . Verify informed consent  . Discharge instructions  . Follow-up   Imaging Ordered: Results for orders placed during the hospital encounter of 11/08/16  DG C-Arm 1-60 Min-No Report   Narrative Fluoroscopy was utilized by the requesting physician.  No radiographic  interpretation.    New Prescriptions   No medications on file   Primary Care Physician: Sofie Hartigan, MD Location: [facility] Note by: Kathlen Brunswick. Dossie Arbour, M.D, DABA, DABAPM, DABPM, DABIPP, FIPP Date: 01/03/2017; Time: 10:28 AM  Disclaimer:  Medicine is not an Chief Strategy Officer. The only guarantee in medicine is that nothing is guaranteed. It is important to note that the decision to proceed with this intervention was based on the information collected from the patient. The Data and conclusions were drawn from the patient's questionnaire, the interview, and the physical examination. Because the information was provided in large part by the patient, it cannot be guaranteed that it has not been purposely or unconsciously manipulated. Every effort has been made to obtain as much relevant data as possible for this evaluation. It is important to note that the conclusions that lead to this procedure are derived in large part from the available data. Always take into account that the treatment will also be dependent on availability of resources and existing treatment guidelines, considered by other Pain Management Practitioners as being common knowledge and practice, at the time of the intervention. For Medico-Legal purposes, it is also important to point out that variation in procedural techniques and pharmacological choices are the acceptable norm. The indications, contraindications, technique, and results of the above procedure should only be interpreted and judged by a Board-Certified Interventional Pain Specialist with extensive familiarity and expertise in the  same exact procedure and technique.  Instructions provided at this appointment: Patient Instructions   Pain Management Discharge Instructions  General Discharge Instructions :  If you need to reach your doctor call: Monday-Friday 8:00 am - 4:00 pm at [phone] or toll free [phone].  After clinic hours [phone] to have operator reach doctor.  Bring all of your medication bottles to all your appointments in the pain clinic.  To cancel or reschedule your appointment with Pain Management please remember to call 24 hours in advance to avoid a fee.  Refer to the educational materials which you have been given on: General Risks, I had my Procedure. Discharge Instructions, Post Sedation.  Post Procedure Instructions:  The drugs you were given will stay in your system until tomorrow, so for the next 24 hours you should not drive, make any legal decisions or drink any alcoholic beverages.  You may eat anything you prefer, but it is better to start with liquids then soups and crackers, and gradually work up to solid foods.  Please notify your doctor immediately if you have any unusual bleeding, trouble breathing or pain that is not related to your normal pain.  Depending on the type of procedure that was done, some parts of your body may feel week and/or numb.  This usually clears up by tonight or the next day.  Walk with the use of an assistive device or accompanied by an adult for the 24 hours.  You may use ice on the affected area for the first 24 hours.  Put ice in a Ziploc bag and cover with a towel and place against area 15 minutes on 15 minutes off.  You may switch to heat after 24 hours.GENERAL RISKS AND COMPLICATIONS  What are the risk, side effects and possible complications? Generally speaking, most procedures are safe.  However, with any procedure there are risks, side effects, and the possibility of complications.  The risks and complications are dependent upon the  sites that are lesioned, or the type of nerve block to be performed.  The closer the procedure is to the spine, the more serious the risks are.  Great care is taken when placing the radio frequency needles, block needles or lesioning probes, but sometimes complications can occur. 1. Infection: Any time there is  an injection through the skin, there is a risk of infection.  This is why sterile conditions are used for these blocks.  There are four possible types of infection. 1. Localized skin infection. 2. Central Nervous System Infection-This can be in the form of Meningitis, which can be deadly. 3. Epidural Infections-This can be in the form of an epidural abscess, which can cause pressure inside of the spine, causing compression of the spinal cord with subsequent paralysis. This would require an emergency surgery to decompress, and there are no guarantees that the patient would recover from the paralysis. 4. Discitis-This is an infection of the intervertebral discs.  It occurs in about 1% of discography procedures.  It is difficult to treat and it may lead to surgery.        2. Pain: the needles have to go through skin and soft tissues, will cause soreness.       3. Damage to internal structures:  The nerves to be lesioned may be near blood vessels or    other nerves which can be potentially damaged.       4. Bleeding: Bleeding is more common if the patient is taking blood thinners such as  aspirin, Coumadin, Ticiid, Plavix, etc., or if he/she have some genetic predisposition  such as hemophilia. Bleeding into the spinal canal can cause compression of the spinal  cord with subsequent paralysis.  This would require an emergency surgery to  decompress and there are no guarantees that the patient would recover from the  paralysis.       5. Pneumothorax:  Puncturing of a lung is a possibility, every time a needle is introduced in  the area of the chest or upper back.  Pneumothorax refers to free air around  the  collapsed lung(s), inside of the thoracic cavity (chest cavity).  Another two possible  complications related to a similar event would include: Hemothorax and Chylothorax.   These are variations of the Pneumothorax, where instead of air around the collapsed  lung(s), you may have blood or chyle, respectively.       6. Spinal headaches: They may occur with any procedures in the area of the spine.       7. Persistent CSF (Cerebro-Spinal Fluid) leakage: This is a rare problem, but may occur  with prolonged intrathecal or epidural catheters either due to the formation of a fistulous  track or a dural tear.       8. Nerve damage: By working so close to the spinal cord, there is always a possibility of  nerve damage, which could be as serious as a permanent spinal cord injury with  paralysis.       9. Death:  Although rare, severe deadly allergic reactions known as "Anaphylactic  reaction" can occur to any of the medications used.      10. Worsening of the symptoms:  We can always make thing worse.  What are the chances of something like this happening? Chances of any of this occuring are extremely low.  By statistics, you have more of a chance of getting killed in a motor vehicle accident: while driving to the hospital than any of the above occurring .  Nevertheless, you should be aware that they are possibilities.  In general, it is similar to taking a shower.  Everybody knows that you can slip, hit your head and get killed.  Does that mean that you should not shower again?  Nevertheless always keep in mind that statistics  do not mean anything if you happen to be on the wrong side of them.  Even if a procedure has a 1 (one) in a 1,000,000 (million) chance of going wrong, it you happen to be that one..Also, keep in mind that by statistics, you have more of a chance of having something go wrong when taking medications.  Who should not have this procedure? If you are on a blood thinning medication (e.g.  Coumadin, Plavix, see list of "Blood Thinners"), or if you have an active infection going on, you should not have the procedure.  If you are taking any blood thinners, please inform your physician.  How should I prepare for this procedure?  Do not eat or drink anything at least six hours prior to the procedure.  Bring a driver with you .  It cannot be a taxi.  Come accompanied by an adult that can drive you back, and that is strong enough to help you if your legs get weak or numb from the local anesthetic.  Take all of your medicines the morning of the procedure with just enough water to swallow them.  If you have diabetes, make sure that you are scheduled to have your procedure done first thing in the morning, whenever possible.  If you have diabetes, take only half of your insulin dose and notify our nurse that you have done so as soon as you arrive at the clinic.  If you are diabetic, but only take blood sugar pills (oral hypoglycemic), then do not take them on the morning of your procedure.  You may take them after you have had the procedure.  Do not take aspirin or any aspirin-containing medications, at least eleven (11) days prior to the procedure.  They may prolong bleeding.  Wear loose fitting clothing that may be easy to take off and that you would not mind if it got stained with Betadine or blood.  Do not wear any jewelry or perfume  Remove any nail coloring.  It will interfere with some of our monitoring equipment.  NOTE: Remember that this is not meant to be interpreted as a complete list of all possible complications.  Unforeseen problems may occur.  BLOOD THINNERS The following drugs contain aspirin or other products, which can cause increased bleeding during surgery and should not be taken for 2 weeks prior to and 1 week after surgery.  If you should need take something for relief of minor pain, you may take acetaminophen which is found in Tylenol,m Datril, Anacin-3 and  Panadol. It is not blood thinner. The products listed below are.  Do not take any of the products listed below in addition to any listed on your instruction sheet.  A.P.C or A.P.C with Codeine Codeine Phosphate Capsules #3 Ibuprofen Ridaura  ABC compound Congesprin Imuran rimadil  Advil Cope Indocin Robaxisal  Alka-Seltzer Effervescent Pain Reliever and Antacid Coricidin or Coricidin-D  Indomethacin Rufen  Alka-Seltzer plus Cold Medicine Cosprin Ketoprofen S-A-C Tablets  Anacin Analgesic Tablets or Capsules Coumadin Korlgesic Salflex  Anacin Extra Strength Analgesic tablets or capsules CP-2 Tablets Lanoril Salicylate  Anaprox Cuprimine Capsules Levenox Salocol  Anexsia-D Dalteparin Magan Salsalate  Anodynos Darvon compound Magnesium Salicylate Sine-off  Ansaid Dasin Capsules Magsal Sodium Salicylate  Anturane Depen Capsules Marnal Soma  APF Arthritis pain formula Dewitt's Pills Measurin Stanback  Argesic Dia-Gesic Meclofenamic Sulfinpyrazone  Arthritis Bayer Timed Release Aspirin Diclofenac Meclomen Sulindac  Arthritis pain formula Anacin Dicumarol Medipren Supac  Analgesic (Safety coated) Arthralgen Diffunasal Mefanamic  Suprofen  Arthritis Strength Bufferin Dihydrocodeine Mepro Compound Suprol  Arthropan liquid Dopirydamole Methcarbomol with Aspirin Synalgos  ASA tablets/Enseals Disalcid Micrainin Tagament  Ascriptin Doan's Midol Talwin  Ascriptin A/D Dolene Mobidin Tanderil  Ascriptin Extra Strength Dolobid Moblgesic Ticlid  Ascriptin with Codeine Doloprin or Doloprin with Codeine Momentum Tolectin  Asperbuf Duoprin Mono-gesic Trendar  Aspergum Duradyne Motrin or Motrin IB Triminicin  Aspirin plain, buffered or enteric coated Durasal Myochrisine Trigesic  Aspirin Suppositories Easprin Nalfon Trillsate  Aspirin with Codeine Ecotrin Regular or Extra Strength Naprosyn Uracel  Atromid-S Efficin Naproxen Ursinus  Auranofin Capsules Elmiron Neocylate Vanquish  Axotal Emagrin Norgesic  Verin  Azathioprine Empirin or Empirin with Codeine Normiflo Vitamin E  Azolid Emprazil Nuprin Voltaren  Bayer Aspirin plain, buffered or children's or timed BC Tablets or powders Encaprin Orgaran Warfarin Sodium  Buff-a-Comp Enoxaparin Orudis Zorpin  Buff-a-Comp with Codeine Equegesic Os-Cal-Gesic   Buffaprin Excedrin plain, buffered or Extra Strength Oxalid   Bufferin Arthritis Strength Feldene Oxphenbutazone   Bufferin plain or Extra Strength Feldene Capsules Oxycodone with Aspirin   Bufferin with Codeine Fenoprofen Fenoprofen Pabalate or Pabalate-SF   Buffets II Flogesic Panagesic   Buffinol plain or Extra Strength Florinal or Florinal with Codeine Panwarfarin   Buf-Tabs Flurbiprofen Penicillamine   Butalbital Compound Four-way cold tablets Penicillin   Butazolidin Fragmin Pepto-Bismol   Carbenicillin Geminisyn Percodan   Carna Arthritis Reliever Geopen Persantine   Carprofen Gold's salt Persistin   Chloramphenicol Goody's Phenylbutazone   Chloromycetin Haltrain Piroxlcam   Clmetidine heparin Plaquenil   Cllnoril Hyco-pap Ponstel   Clofibrate Hydroxy chloroquine Propoxyphen         Before stopping any of these medications, be sure to consult the physician who ordered them.  Some, such as Coumadin (Warfarin) are ordered to prevent or treat serious conditions such as "deep thrombosis", "pumonary embolisms", and other heart problems.  The amount of time that you may need off of the medication may also vary with the medication and the reason for which you were taking it.  If you are taking any of these medications, please make sure you notify your pain physician before you undergo any procedures.         Facet Joint Block The facet joints connect the bones of the spine (vertebrae). They make it possible for you to bend, twist, and make other movements with your spine. They also keep you from bending too far, twisting too far, and making other excessive movements. A facet joint  block is a procedure where a numbing medicine (anesthetic) is injected into a facet joint. Often, a type of anti-inflammatory medicine called a steroid is also injected. A facet joint block may be done to diagnose neck or back pain. If the pain gets better after a facet joint block, it means the pain is probably coming from the facet joint. If the pain does not get better, it means the pain is probably not coming from the facet joint. A facet joint block may also be done to relieve neck or back pain caused by an inflamed facet joint. A facet joint block is only done to relieve pain if the pain does not improve with other methods, such as medicine, exercise programs, and physical therapy. Tell a health care provider about:  Any allergies you have.  All medicines you are taking, including vitamins, herbs, eye drops, creams, and over-the-counter medicines.  Any problems you or family members have had with anesthetic medicines.  Any blood disorders you have.  Any surgeries  you have had.  Any medical conditions you have.  Whether you are pregnant or may be pregnant. What are the risks? Generally, this is a safe procedure. However, problems may occur, including:  Bleeding.  Injury to a nerve near the injection site.  Pain at the injection site.  Weakness or numbness in areas controlled by nerves near the injection site.  Infection.  Temporary fluid retention.  Allergic reactions to medicines or dyes.  Injury to other structures or organs near the injection site. What happens before the procedure?  Follow instructions from your health care provider about eating or drinking restrictions.  Ask your health care provider about:  Changing or stopping your regular medicines. This is especially important if you are taking diabetes medicines or blood thinners.  Taking medicines such as aspirin and ibuprofen. These medicines can thin your blood. Do not take these medicines before your  procedure if your health care provider instructs you not to.  Do not take any new dietary supplements or medicines without asking your health care provider first.  Plan to have someone take you home after the procedure. What happens during the procedure?  You may need to remove your clothing and dress in an open-back gown.  The procedure will be done while you are lying on an X-ray table. You will most likely be asked to lie on your stomach, but you may be asked to lie in a different position if an injection will be made in your neck.  Machines will be used to monitor your oxygen levels, heart rate, and blood pressure.  If an injection will be made in your neck, an IV tube will be inserted into one of your veins. Fluids and medicine will flow directly into your body through the IV tube.  The area over the facet joint where the injection will be made will be cleaned with soap. The surrounding skin will be covered with clean drapes.  A numbing medicine (local anesthetic) will be applied to your skin. Your skin may sting or burn for a moment.  A video X-ray machine (fluoroscopy) will be used to locate the joint. In some cases, a CT scan may be used.  A contrast dye may be injected into the facet joint area to help locate the joint.  When the joint is located, an anesthetic will be injected into the joint through the needle.  Your health care provider will ask you whether you feel pain relief. If you do feel relief, a steroid may be injected to provide pain relief for a longer period of time. If you do not feel relief or feel only partial relief, additional injections of an anesthetic may be made in other facet joints.  The needle will be removed.  Your skin will be cleaned.  A bandage (dressing) will be applied over each injection site. The procedure may vary among health care providers and hospitals. What happens after the procedure?  You will be observed for 15-30 minutes before  being allowed to go home. This information is not intended to replace advice given to you by your health care provider. Make sure you discuss any questions you have with your health care provider. Document Released: 01/19/2007 Document Revised: 10/01/2015 Document Reviewed: 05/26/2015 Elsevier Interactive Patient Education  2017 [place] After Refer to this sheet in the next few weeks. These instructions provide you with information about caring for yourself after your procedure. Your health care provider may also give you more  specific instructions. Your treatment has been planned according to current medical practices, but problems sometimes occur. Call your health care provider if you have any problems or questions after your procedure. What can I expect after the procedure? After the procedure, it is common to have:  Some tenderness over the injection sites for 2 days after the procedure.  A temporary increase in blood sugar if you have diabetes. Follow these instructions at home:  Keep track of the amount of pain relief you feel and how long it lasts.  Take over-the-counter and prescription medicines only as told by your health care provider. You may need to limit pain medicine within the first 4-6 hours after the procedure.  Remove your bandages (dressings) the morning after the procedure.  For the first 24 hours after the procedure:  Do not apply heat near or over the injection sites.  Do not take a bath or soak in water, such as in a pool or lake.  Do not drive or operate heavy machinery unless approved by your health care provider.  Avoid activities that require a lot of energy.  If the injection site is tender, try applying ice to the area. To do this:  Put ice in a plastic bag.  Place a towel between your skin and the bag.  Leave the ice on for 20 minutes, 2-3 times a day.  Keep all follow-up visits as told by your health care provider.  This is important. Contact a health care provider if:  Fluid is coming from an injection site.  There is significant bleeding or swelling at an injection site.  You have diabetes and your blood sugar is above 180 mg/dL. Get help right away if:  You have a fever.  You have worsening pain or swelling around an injection site.  There are red streaks around an injection site.  You develop severe pain that is not controlled by your medicines.  You develop a headache, stiff neck, nausea, or vomiting.  Your eyes become very sensitive to light.  You have weakness, paralysis, or tingling in your arms or legs that was not present before the procedure.  You have difficulty urinating or breathing. This information is not intended to replace advice given to you by your health care provider. Make sure you discuss any questions you have with your health care provider. Document Released: 08/16/2012 Document Revised: 01/14/2016 Document Reviewed: 05/26/2015 Elsevier Interactive Patient Education  2017 [place].  Post-Procedure instructions Instructions:  Apply ice: Fill a plastic sandwich bag with crushed ice. Cover it with a small towel and apply to injection site. Apply for 15 minutes then remove x 15 minutes. Repeat sequence on day of procedure, until you go to bed. The purpose is to minimize swelling and discomfort after procedure.  Apply heat: Apply heat to procedure site starting the day following the procedure. The purpose is to treat any soreness and discomfort from the procedure.  Food intake: Start with clear liquids (like water) and advance to regular food, as tolerated.   Physical activities: Keep activities to a minimum for the first 8 hours after the procedure.   Driving: If you have received any sedation, you are not allowed to drive for 24 hours after your procedure.  Blood thinner: Restart your blood thinner 6 hours after your procedure. (Only for those taking blood  thinners)  Insulin: As soon as you can eat, you may resume your normal dosing schedule. (Only for those taking insulin)  Infection prevention: Keep  procedure site clean and dry.  Post-procedure Pain Diary: Extremely important that this be done correctly and accurately. Recorded information will be used to determine the next step in treatment.  Pain evaluated is that of treated area only. Do not include pain from an untreated area.  Complete every hour, on the hour, for the initial 8 hours. Set an alarm to help you do this part accurately.  Do not go to sleep and have it completed later. It will not be accurate.  Follow-up appointment: Keep your follow-up appointment after the procedure. Usually 2 weeks for most procedures. (6 weeks in the case of radiofrequency.) Bring you pain diary.  Expect:  From numbing medicine (AKA: Local Anesthetics): Numbness or decrease in pain.  Onset: Full effect within 15 minutes of injected.  Duration: It will depend on the type of local anesthetic used. On the average, 1 to 8 hours.   From steroids: Decrease in swelling or inflammation. Once inflammation is improved, relief of the pain will follow.  Onset of benefits: Depends on the amount of swelling present. The more swelling, the longer it will take for the benefits to be seen.   Duration: Steroids will stay in the system x 2 weeks. Duration of benefits will depend on multiple posibilities including persistent irritating factors.  From procedure: Some discomfort is to be expected once the numbing medicine wears off. This should be minimal if ice and heat are applied as instructed. Call if:  You experience numbness and weakness that gets worse with time, as opposed to wearing off.  New onset bowel or bladder incontinence. (Spinal procedures only)  Emergency Numbers:  [facility] hours (Monday - Thursday, 8:00 AM - 4:00 PM) (Friday, 9:00 AM - 12:00 Noon): [phone]  After hours: [phone]  [phone] _____________________________________________________________________________________________  Sacroiliac (SI) Joint Injection Patient Information  Description: The sacroiliac joint connects the scrum (very low back and tailbone) to the ilium (a pelvic bone which also forms half of the hip joint).  Normally this joint experiences very little motion.  When this joint becomes inflamed or unstable low back and or hip and pelvis pain may result.  Injection of this joint with local anesthetics (numbing medicines) and steroids can provide diagnostic information and reduce pain.  This injection is performed with the aid of x-ray guidance into the tailbone area while you are lying on your stomach.   You may experience an electrical sensation down the leg while this is being done.  You may also experience numbness.  We also may ask if we are reproducing your normal pain during the injection.  Conditions which may be treated SI injection:  Low back, buttock, hip or leg pain  Preparation for the Injection:  Do not eat any solid food or dairy products within 8 hours of your appointment.  You may drink clear liquids up to 3 hours before appointment.  Clear liquids include water, black coffee, juice or soda.  No milk or cream please. You may take your regular medications, including pain medications with a sip of water before your appointment.  Diabetics should hold regular insulin (if take separately) and take 1/2 normal NPH dose the morning of the procedure.  Carry some sugar containing items with you to your appointment. A driver must accompany you and be prepared to drive you home after your procedure. Bring all of your current medications with you. An IV may be inserted and sedation may be given at the discretion of the physician. A blood pressure  cuff, EKG and other monitors will often be applied during the procedure.  Some patients may need to have extra oxygen administered for a short period.    You will be asked to provide medical information, including your allergies, prior to the procedure.  We must know immediately if you are taking blood thinners (like Coumadin/Warfarin) or if you are allergic to IV iodine contrast (dye).  We must know if you could possible be pregnant.  Possible side effects:  Bleeding from needle site Infection (rare, may require surgery) Nerve injury (rare) Numbness & tingling (temporary) A brief convulsion or seizure Light-headedness (temporary) Pain at injection site (several days) Decreased blood pressure (temporary) Weakness in the leg (temporary)   Call if you experience:  New onset weakness or numbness of an extremity below the injection site that last more than 8 hours. Hives or difficulty breathing ( go to the emergency room) Inflammation or drainage at the injection site Any new symptoms which are concerning to you  Please note:  Although the local anesthetic injected can often make your back/ hip/ buttock/ leg feel good for several hours after the injections, the pain will likely return.  It takes 3-7 days for steroids to work in the sacroiliac area.  You may not notice any pain relief for at least that one week.  If effective, we will often do a series of three injections spaced 3-6 weeks apart to maximally decrease your pain.  After the initial series, we generally will wait some months before a repeat injection of the same type.  If you have any questions, please call [phone] [place]  What are the risk, side effects and possible complications? Generally speaking, most procedures are safe.  However, with any procedure there are risks, side effects, and the possibility of complications.  The risks and complications are dependent upon the sites that are lesioned, or the type of nerve block to be performed.  The closer the procedure is to the spine, the more  serious the risks are.  Great care is taken when placing the radio frequency needles, block needles or lesioning probes, but sometimes complications can occur. 2. Infection: Any time there is an injection through the skin, there is a risk of infection.  This is why sterile conditions are used for these blocks.  There are four possible types of infection. 1. Localized skin infection. 2. Central Nervous System Infection-This can be in the form of Meningitis, which can be deadly. 3. Epidural Infections-This can be in the form of an epidural abscess, which can cause pressure inside of the spine, causing compression of the spinal cord with subsequent paralysis. This would require an emergency surgery to decompress, and there are no guarantees that the patient would recover from the paralysis. 4. Discitis-This is an infection of the intervertebral discs.  It occurs in about 1% of discography procedures.  It is difficult to treat and it may lead to surgery.        2. Pain: the needles have to go through skin and soft tissues, will cause soreness.       3. Damage to internal structures:  The nerves to be lesioned may be near blood vessels or    other nerves which can be potentially damaged.       4. Bleeding: Bleeding is more common if the patient is taking blood thinners such as  aspirin, Coumadin, Ticiid, Plavix, etc., or if he/she have  some genetic predisposition  such as hemophilia. Bleeding into the spinal canal can cause compression of the spinal  cord with subsequent paralysis.  This would require an emergency surgery to  decompress and there are no guarantees that the patient would recover from the  paralysis.       5. Pneumothorax:  Puncturing of a lung is a possibility, every time a needle is introduced in  the area of the chest or upper back.  Pneumothorax refers to free air around the  collapsed lung(s), inside of the thoracic cavity (chest cavity).  Another two possible  complications related to a  similar event would include: Hemothorax and Chylothorax.   These are variations of the Pneumothorax, where instead of air around the collapsed  lung(s), you may have blood or chyle, respectively.       6. Spinal headaches: They may occur with any procedures in the area of the spine.       7. Persistent CSF (Cerebro-Spinal Fluid) leakage: This is a rare problem, but may occur  with prolonged intrathecal or epidural catheters either due to the formation of a fistulous  track or a dural tear.       8. Nerve damage: By working so close to the spinal cord, there is always a possibility of  nerve damage, which could be as serious as a permanent spinal cord injury with  paralysis.       9. Death:  Although rare, severe deadly allergic reactions known as "Anaphylactic  reaction" can occur to any of the medications used.      10. Worsening of the symptoms:  We can always make thing worse.  What are the chances of something like this happening? Chances of any of this occuring are extremely low.  By statistics, you have more of a chance of getting killed in a motor vehicle accident: while driving to the hospital than any of the above occurring .  Nevertheless, you should be aware that they are possibilities.  In general, it is similar to taking a shower.  Everybody knows that you can slip, hit your head and get killed.  Does that mean that you should not shower again?  Nevertheless always keep in mind that statistics do not mean anything if you happen to be on the wrong side of them.  Even if a procedure has a 1 (one) in a 1,000,000 (million) chance of going wrong, it you happen to be that one..Also, keep in mind that by statistics, you have more of a chance of having something go wrong when taking medications.  Who should not have this procedure? If you are on a blood thinning medication (e.g. Coumadin, Plavix, see list of "Blood Thinners"), or if you have an active infection going on, you should not have the  procedure.  If you are taking any blood thinners, please inform your physician.  How should I prepare for this procedure?  Do not eat or drink anything at least six hours prior to the procedure.  Bring a driver with you .  It cannot be a taxi.  Come accompanied by an adult that can drive you back, and that is strong enough to help you if your legs get weak or numb from the local anesthetic.  Take all of your medicines the morning of the procedure with just enough water to swallow them.  If you have diabetes, make sure that you are scheduled to have your procedure done first thing in the morning, whenever  possible.  If you have diabetes, take only half of your insulin dose and notify our nurse that you have done so as soon as you arrive at the clinic.  If you are diabetic, but only take blood sugar pills (oral hypoglycemic), then do not take them on the morning of your procedure.  You may take them after you have had the procedure.  Do not take aspirin or any aspirin-containing medications, at least eleven (11) days prior to the procedure.  They may prolong bleeding.  Wear loose fitting clothing that may be easy to take off and that you would not mind if it got stained with Betadine or blood.  Do not wear any jewelry or perfume  Remove any nail coloring.  It will interfere with some of our monitoring equipment.  NOTE: Remember that this is not meant to be interpreted as a complete list of all possible complications.  Unforeseen problems may occur.  BLOOD THINNERS The following drugs contain aspirin or other products, which can cause increased bleeding during surgery and should not be taken for 2 weeks prior to and 1 week after surgery.  If you should need take something for relief of minor pain, you may take acetaminophen which is found in Tylenol,m Datril, Anacin-3 and Panadol. It is not blood thinner. The products listed below are.  Do not take any of the products listed below in  addition to any listed on your instruction sheet.  A.P.C or A.P.C with Codeine Codeine Phosphate Capsules #3 Ibuprofen Ridaura  ABC compound Congesprin Imuran rimadil  Advil Cope Indocin Robaxisal  Alka-Seltzer Effervescent Pain Reliever and Antacid Coricidin or Coricidin-D  Indomethacin Rufen  Alka-Seltzer plus Cold Medicine Cosprin Ketoprofen S-A-C Tablets  Anacin Analgesic Tablets or Capsules Coumadin Korlgesic Salflex  Anacin Extra Strength Analgesic tablets or capsules CP-2 Tablets Lanoril Salicylate  Anaprox Cuprimine Capsules Levenox Salocol  Anexsia-D Dalteparin Magan Salsalate  Anodynos Darvon compound Magnesium Salicylate Sine-off  Ansaid Dasin Capsules Magsal Sodium Salicylate  Anturane Depen Capsules Marnal Soma  APF Arthritis pain formula Dewitt's Pills Measurin Stanback  Argesic Dia-Gesic Meclofenamic Sulfinpyrazone  Arthritis Bayer Timed Release Aspirin Diclofenac Meclomen Sulindac  Arthritis pain formula Anacin Dicumarol Medipren Supac  Analgesic (Safety coated) Arthralgen Diffunasal Mefanamic Suprofen  Arthritis Strength Bufferin Dihydrocodeine Mepro Compound Suprol  Arthropan liquid Dopirydamole Methcarbomol with Aspirin Synalgos  ASA tablets/Enseals Disalcid Micrainin Tagament  Ascriptin Doan's Midol Talwin  Ascriptin A/D Dolene Mobidin Tanderil  Ascriptin Extra Strength Dolobid Moblgesic Ticlid  Ascriptin with Codeine Doloprin or Doloprin with Codeine Momentum Tolectin  Asperbuf Duoprin Mono-gesic Trendar  Aspergum Duradyne Motrin or Motrin IB Triminicin  Aspirin plain, buffered or enteric coated Durasal Myochrisine Trigesic  Aspirin Suppositories Easprin Nalfon Trillsate  Aspirin with Codeine Ecotrin Regular or Extra Strength Naprosyn Uracel  Atromid-S Efficin Naproxen Ursinus  Auranofin Capsules Elmiron Neocylate Vanquish  Axotal Emagrin Norgesic Verin  Azathioprine Empirin or Empirin with Codeine Normiflo Vitamin E  Azolid Emprazil Nuprin Voltaren  Bayer  Aspirin plain, buffered or children's or timed BC Tablets or powders Encaprin Orgaran Warfarin Sodium  Buff-a-Comp Enoxaparin Orudis Zorpin  Buff-a-Comp with Codeine Equegesic Os-Cal-Gesic   Buffaprin Excedrin plain, buffered or Extra Strength Oxalid   Bufferin Arthritis Strength Feldene Oxphenbutazone   Bufferin plain or Extra Strength Feldene Capsules Oxycodone with Aspirin   Bufferin with Codeine Fenoprofen Fenoprofen Pabalate or Pabalate-SF   Buffets II Flogesic Panagesic   Buffinol plain or Extra Strength Florinal or Florinal with Codeine Panwarfarin   Buf-Tabs Flurbiprofen Penicillamine  Butalbital Compound Four-way cold tablets Penicillin   Butazolidin Fragmin Pepto-Bismol   Carbenicillin Geminisyn Percodan   Carna Arthritis Reliever Geopen Persantine   Carprofen Gold's salt Persistin   Chloramphenicol Goody's Phenylbutazone   Chloromycetin Haltrain Piroxlcam   Clmetidine heparin Plaquenil   Cllnoril Hyco-pap Ponstel   Clofibrate Hydroxy chloroquine Propoxyphen         Before stopping any of these medications, be sure to consult the physician who ordered them.  Some, such as Coumadin (Warfarin) are ordered to prevent or treat serious conditions such as "deep thrombosis", "pumonary embolisms", and other heart problems.  The amount of time that you may need off of the medication may also vary with the medication and the reason for which you were taking it.  If you are taking any of these medications, please make sure you notify your pain physician before you undergo any procedures.

## 2017-01-03 NOTE — Patient Instructions (Addendum)
Pain Management Discharge Instructions  General Discharge Instructions :  If you need to reach your doctor call: Monday-Friday 8:00 am - 4:00 pm at [phone] or toll free [phone].  After clinic hours [phone] to have operator reach doctor.  Bring all of your medication bottles to all your appointments in the pain clinic.  To cancel or reschedule your appointment with Pain Management please remember to call 24 hours in advance to avoid a fee.  Refer to the educational materials which you have been given on: General Risks, I had my Procedure. Discharge Instructions, Post Sedation.  Post Procedure Instructions:  The drugs you were given will stay in your system until tomorrow, so for the next 24 hours you should not drive, make any legal decisions or drink any alcoholic beverages.  You may eat anything you prefer, but it is better to start with liquids then soups and crackers, and gradually work up to solid foods.  Please notify your doctor immediately if you have any unusual bleeding, trouble breathing or pain that is not related to your normal pain.  Depending on the type of procedure that was done, some parts of your body may feel week and/or numb.  This usually clears up by tonight or the next day.  Walk with the use of an assistive device or accompanied by an adult for the 24 hours.  You may use ice on the affected area for the first 24 hours.  Put ice in a Ziploc bag and cover with a towel and place against area 15 minutes on 15 minutes off.  You may switch to heat after 24 hours.GENERAL RISKS AND COMPLICATIONS  What are the risk, side effects and possible complications? Generally speaking, most procedures are safe.  However, with any procedure there are risks, side effects, and the possibility of complications.  The risks and complications are dependent upon the sites that are lesioned, or the type of nerve block to be performed.  The closer the procedure is to the spine,  the more serious the risks are.  Great care is taken when placing the radio frequency needles, block needles or lesioning probes, but sometimes complications can occur. 1. Infection: Any time there is an injection through the skin, there is a risk of infection.  This is why sterile conditions are used for these blocks.  There are four possible types of infection. 1. Localized skin infection. 2. Central Nervous System Infection-This can be in the form of Meningitis, which can be deadly. 3. Epidural Infections-This can be in the form of an epidural abscess, which can cause pressure inside of the spine, causing compression of the spinal cord with subsequent paralysis. This would require an emergency surgery to decompress, and there are no guarantees that the patient would recover from the paralysis. 4. Discitis-This is an infection of the intervertebral discs.  It occurs in about 1% of discography procedures.  It is difficult to treat and it may lead to surgery.        2. Pain: the needles have to go through skin and soft tissues, will cause soreness.       3. Damage to internal structures:  The nerves to be lesioned may be near blood vessels or    other nerves which can be potentially damaged.       4. Bleeding: Bleeding is more common if the patient is taking blood thinners such as  aspirin, Coumadin, Ticiid, Plavix, etc., or if he/she have some genetic predisposition  such as   hemophilia. Bleeding into the spinal canal can cause compression of the spinal  cord with subsequent paralysis.  This would require an emergency surgery to  decompress and there are no guarantees that the patient would recover from the  paralysis.       5. Pneumothorax:  Puncturing of a lung is a possibility, every time a needle is introduced in  the area of the chest or upper back.  Pneumothorax refers to free air around the  collapsed lung(s), inside of the thoracic cavity (chest cavity).  Another two possible  complications  related to a similar event would include: Hemothorax and Chylothorax.   These are variations of the Pneumothorax, where instead of air around the collapsed  lung(s), you may have blood or chyle, respectively.       6. Spinal headaches: They may occur with any procedures in the area of the spine.       7. Persistent CSF (Cerebro-Spinal Fluid) leakage: This is a rare problem, but may occur  with prolonged intrathecal or epidural catheters either due to the formation of a fistulous  track or a dural tear.       8. Nerve damage: By working so close to the spinal cord, there is always a possibility of  nerve damage, which could be as serious as a permanent spinal cord injury with  paralysis.       9. Death:  Although rare, severe deadly allergic reactions known as "Anaphylactic  reaction" can occur to any of the medications used.      10. Worsening of the symptoms:  We can always make thing worse.  What are the chances of something like this happening? Chances of any of this occuring are extremely low.  By statistics, you have more of a chance of getting killed in a motor vehicle accident: while driving to the hospital than any of the above occurring .  Nevertheless, you should be aware that they are possibilities.  In general, it is similar to taking a shower.  Everybody knows that you can slip, hit your head and get killed.  Does that mean that you should not shower again?  Nevertheless always keep in mind that statistics do not mean anything if you happen to be on the wrong side of them.  Even if a procedure has a 1 (one) in a 1,000,000 (million) chance of going wrong, it you happen to be that one..Also, keep in mind that by statistics, you have more of a chance of having something go wrong when taking medications.  Who should not have this procedure? If you are on a blood thinning medication (e.g. Coumadin, Plavix, see list of "Blood Thinners"), or if you have an active infection going on, you should not  have the procedure.  If you are taking any blood thinners, please inform your physician.  How should I prepare for this procedure?  Do not eat or drink anything at least six hours prior to the procedure.  Bring a driver with you .  It cannot be a taxi.  Come accompanied by an adult that can drive you back, and that is strong enough to help you if your legs get weak or numb from the local anesthetic.  Take all of your medicines the morning of the procedure with just enough water to swallow them.  If you have diabetes, make sure that you are scheduled to have your procedure done first thing in the morning, whenever possible.  If you have diabetes,   take only half of your insulin dose and notify our nurse that you have done so as soon as you arrive at the clinic.  If you are diabetic, but only take blood sugar pills (oral hypoglycemic), then do not take them on the morning of your procedure.  You may take them after you have had the procedure.  Do not take aspirin or any aspirin-containing medications, at least eleven (11) days prior to the procedure.  They may prolong bleeding.  Wear loose fitting clothing that may be easy to take off and that you would not mind if it got stained with Betadine or blood.  Do not wear any jewelry or perfume  Remove any nail coloring.  It will interfere with some of our monitoring equipment.  NOTE: Remember that this is not meant to be interpreted as a complete list of all possible complications.  Unforeseen problems may occur.  BLOOD THINNERS The following drugs contain aspirin or other products, which can cause increased bleeding during surgery and should not be taken for 2 weeks prior to and 1 week after surgery.  If you should need take something for relief of minor pain, you may take acetaminophen which is found in Tylenol,m Datril, Anacin-3 and Panadol. It is not blood thinner. The products listed below are.  Do not take any of the products listed below  in addition to any listed on your instruction sheet.  A.P.C or A.P.C with Codeine Codeine Phosphate Capsules #3 Ibuprofen Ridaura  ABC compound Congesprin Imuran rimadil  Advil Cope Indocin Robaxisal  Alka-Seltzer Effervescent Pain Reliever and Antacid Coricidin or Coricidin-D  Indomethacin Rufen  Alka-Seltzer plus Cold Medicine Cosprin Ketoprofen S-A-C Tablets  Anacin Analgesic Tablets or Capsules Coumadin Korlgesic Salflex  Anacin Extra Strength Analgesic tablets or capsules CP-2 Tablets Lanoril Salicylate  Anaprox Cuprimine Capsules Levenox Salocol  Anexsia-D Dalteparin Magan Salsalate  Anodynos Darvon compound Magnesium Salicylate Sine-off  Ansaid Dasin Capsules Magsal Sodium Salicylate  Anturane Depen Capsules Marnal Soma  APF Arthritis pain formula Dewitt's Pills Measurin Stanback  Argesic Dia-Gesic Meclofenamic Sulfinpyrazone  Arthritis Bayer Timed Release Aspirin Diclofenac Meclomen Sulindac  Arthritis pain formula Anacin Dicumarol Medipren Supac  Analgesic (Safety coated) Arthralgen Diffunasal Mefanamic Suprofen  Arthritis Strength Bufferin Dihydrocodeine Mepro Compound Suprol  Arthropan liquid Dopirydamole Methcarbomol with Aspirin Synalgos  ASA tablets/Enseals Disalcid Micrainin Tagament  Ascriptin Doan's Midol Talwin  Ascriptin A/D Dolene Mobidin Tanderil  Ascriptin Extra Strength Dolobid Moblgesic Ticlid  Ascriptin with Codeine Doloprin or Doloprin with Codeine Momentum Tolectin  Asperbuf Duoprin Mono-gesic Trendar  Aspergum Duradyne Motrin or Motrin IB Triminicin  Aspirin plain, buffered or enteric coated Durasal Myochrisine Trigesic  Aspirin Suppositories Easprin Nalfon Trillsate  Aspirin with Codeine Ecotrin Regular or Extra Strength Naprosyn Uracel  Atromid-S Efficin Naproxen Ursinus  Auranofin Capsules Elmiron Neocylate Vanquish  Axotal Emagrin Norgesic Verin  Azathioprine Empirin or Empirin with Codeine Normiflo Vitamin E  Azolid Emprazil Nuprin Voltaren  Bayer  Aspirin plain, buffered or children's or timed BC Tablets or powders Encaprin Orgaran Warfarin Sodium  Buff-a-Comp Enoxaparin Orudis Zorpin  Buff-a-Comp with Codeine Equegesic Os-Cal-Gesic   Buffaprin Excedrin plain, buffered or Extra Strength Oxalid   Bufferin Arthritis Strength Feldene Oxphenbutazone   Bufferin plain or Extra Strength Feldene Capsules Oxycodone with Aspirin   Bufferin with Codeine Fenoprofen Fenoprofen Pabalate or Pabalate-SF   Buffets II Flogesic Panagesic   Buffinol plain or Extra Strength Florinal or Florinal with Codeine Panwarfarin   Buf-Tabs Flurbiprofen Penicillamine   Butalbital Compound Four-way cold tablets   Penicillin   Butazolidin Fragmin Pepto-Bismol   Carbenicillin Geminisyn Percodan   Carna Arthritis Reliever Geopen Persantine   Carprofen Gold's salt Persistin   Chloramphenicol Goody's Phenylbutazone   Chloromycetin Haltrain Piroxlcam   Clmetidine heparin Plaquenil   Cllnoril Hyco-pap Ponstel   Clofibrate Hydroxy chloroquine Propoxyphen         Before stopping any of these medications, be sure to consult the physician who ordered them.  Some, such as Coumadin (Warfarin) are ordered to prevent or treat serious conditions such as "deep thrombosis", "pumonary embolisms", and other heart problems.  The amount of time that you may need off of the medication may also vary with the medication and the reason for which you were taking it.  If you are taking any of these medications, please make sure you notify your pain physician before you undergo any procedures.         Facet Joint Block The facet joints connect the bones of the spine (vertebrae). They make it possible for you to bend, twist, and make other movements with your spine. They also keep you from bending too far, twisting too far, and making other excessive movements. A facet joint block is a procedure where a numbing medicine (anesthetic) is injected into a facet joint. Often, a type of  anti-inflammatory medicine called a steroid is also injected. A facet joint block may be done to diagnose neck or back pain. If the pain gets better after a facet joint block, it means the pain is probably coming from the facet joint. If the pain does not get better, it means the pain is probably not coming from the facet joint. A facet joint block may also be done to relieve neck or back pain caused by an inflamed facet joint. A facet joint block is only done to relieve pain if the pain does not improve with other methods, such as medicine, exercise programs, and physical therapy. Tell a health care provider about:  Any allergies you have.  All medicines you are taking, including vitamins, herbs, eye drops, creams, and over-the-counter medicines.  Any problems you or family members have had with anesthetic medicines.  Any blood disorders you have.  Any surgeries you have had.  Any medical conditions you have.  Whether you are pregnant or may be pregnant. What are the risks? Generally, this is a safe procedure. However, problems may occur, including:  Bleeding.  Injury to a nerve near the injection site.  Pain at the injection site.  Weakness or numbness in areas controlled by nerves near the injection site.  Infection.  Temporary fluid retention.  Allergic reactions to medicines or dyes.  Injury to other structures or organs near the injection site. What happens before the procedure?  Follow instructions from your health care provider about eating or drinking restrictions.  Ask your health care provider about:  Changing or stopping your regular medicines. This is especially important if you are taking diabetes medicines or blood thinners.  Taking medicines such as aspirin and ibuprofen. These medicines can thin your blood. Do not take these medicines before your procedure if your health care provider instructs you not to.  Do not take any new dietary supplements or  medicines without asking your health care provider first.  Plan to have someone take you home after the procedure. What happens during the procedure?  You may need to remove your clothing and dress in an open-back gown.  The procedure will be done while you are lying on   an X-ray table. You will most likely be asked to lie on your stomach, but you may be asked to lie in a different position if an injection will be made in your neck.  Machines will be used to monitor your oxygen levels, heart rate, and blood pressure.  If an injection will be made in your neck, an IV tube will be inserted into one of your veins. Fluids and medicine will flow directly into your body through the IV tube.  The area over the facet joint where the injection will be made will be cleaned with soap. The surrounding skin will be covered with clean drapes.  A numbing medicine (local anesthetic) will be applied to your skin. Your skin may sting or burn for a moment.  A video X-ray machine (fluoroscopy) will be used to locate the joint. In some cases, a CT scan may be used.  A contrast dye may be injected into the facet joint area to help locate the joint.  When the joint is located, an anesthetic will be injected into the joint through the needle.  Your health care provider will ask you whether you feel pain relief. If you do feel relief, a steroid may be injected to provide pain relief for a longer period of time. If you do not feel relief or feel only partial relief, additional injections of an anesthetic may be made in other facet joints.  The needle will be removed.  Your skin will be cleaned.  A bandage (dressing) will be applied over each injection site. The procedure may vary among health care providers and hospitals. What happens after the procedure?  You will be observed for 15-30 minutes before being allowed to go home. This information is not intended to replace advice given to you by your health care  provider. Make sure you discuss any questions you have with your health care provider. Document Released: 01/19/2007 Document Revised: 10/01/2015 Document Reviewed: 05/26/2015 Elsevier Interactive Patient Education  2017 [employer] Facet Joint Block, Care After Refer to this sheet in the next few weeks. These instructions provide you with information about caring for yourself after your procedure. Your health care provider may also give you more specific instructions. Your treatment has been planned according to current medical practices, but problems sometimes occur. Call your health care provider if you have any problems or questions after your procedure. What can I expect after the procedure? After the procedure, it is common to have:  Some tenderness over the injection sites for 2 days after the procedure.  A temporary increase in blood sugar if you have diabetes. Follow these instructions at home:  Keep track of the amount of pain relief you feel and how long it lasts.  Take over-the-counter and prescription medicines only as told by your health care provider. You may need to limit pain medicine within the first 4-6 hours after the procedure.  Remove your bandages (dressings) the morning after the procedure.  For the first 24 hours after the procedure:  Do not apply heat near or over the injection sites.  Do not take a bath or soak in water, such as in a pool or lake.  Do not drive or operate heavy machinery unless approved by your health care provider.  Avoid activities that require a lot of energy.  If the injection site is tender, try applying ice to the area. To do this:  Put ice in a plastic bag.  Place a towel between your skin and   the bag.  Leave the ice on for 20 minutes, 2-3 times a day.  Keep all follow-up visits as told by your health care provider. This is important. Contact a health care provider if:  Fluid is coming from an injection site.  There is  significant bleeding or swelling at an injection site.  You have diabetes and your blood sugar is above 180 mg/dL. Get help right away if:  You have a fever.  You have worsening pain or swelling around an injection site.  There are red streaks around an injection site.  You develop severe pain that is not controlled by your medicines.  You develop a headache, stiff neck, nausea, or vomiting.  Your eyes become very sensitive to light.  You have weakness, paralysis, or tingling in your arms or legs that was not present before the procedure.  You have difficulty urinating or breathing. This information is not intended to replace advice given to you by your health care provider. Make sure you discuss any questions you have with your health care provider. Document Released: 08/16/2012 Document Revised: 01/14/2016 Document Reviewed: 05/26/2015 Elsevier Interactive Patient Education  2017 [place].  Post-Procedure instructions Instructions:  Apply ice: Fill a plastic sandwich bag with crushed ice. Cover it with a small towel and apply to injection site. Apply for 15 minutes then remove x 15 minutes. Repeat sequence on day of procedure, until you go to bed. The purpose is to minimize swelling and discomfort after procedure.  Apply heat: Apply heat to procedure site starting the day following the procedure. The purpose is to treat any soreness and discomfort from the procedure.  Food intake: Start with clear liquids (like water) and advance to regular food, as tolerated.   Physical activities: Keep activities to a minimum for the first 8 hours after the procedure.   Driving: If you have received any sedation, you are not allowed to drive for 24 hours after your procedure.  Blood thinner: Restart your blood thinner 6 hours after your procedure. (Only for those taking blood thinners)  Insulin: As soon as you can eat, you may resume your normal dosing schedule. (Only for those taking  insulin)  Infection prevention: Keep procedure site clean and dry.  Post-procedure Pain Diary: Extremely important that this be done correctly and accurately. Recorded information will be used to determine the next step in treatment.  Pain evaluated is that of treated area only. Do not include pain from an untreated area.  Complete every hour, on the hour, for the initial 8 hours. Set an alarm to help you do this part accurately.  Do not go to sleep and have it completed later. It will not be accurate.  Follow-up appointment: Keep your follow-up appointment after the procedure. Usually 2 weeks for most procedures. (6 weeks in the case of radiofrequency.) Bring you pain diary.  Expect:  From numbing medicine (AKA: Local Anesthetics): Numbness or decrease in pain.  Onset: Full effect within 15 minutes of injected.  Duration: It will depend on the type of local anesthetic used. On the average, 1 to 8 hours.   From steroids: Decrease in swelling or inflammation. Once inflammation is improved, relief of the pain will follow.  Onset of benefits: Depends on the amount of swelling present. The more swelling, the longer it will take for the benefits to be seen.   Duration: Steroids will stay in the system x 2 weeks. Duration of benefits will depend on multiple posibilities including persistent irritating factors.  From procedure: Some discomfort is to be expected once the numbing medicine wears off. This should be minimal if ice and heat are applied as instructed. Call if:  You experience numbness and weakness that gets worse with time, as opposed to wearing off.  New onset bowel or bladder incontinence. (Spinal procedures only)  Emergency Numbers:  [facility] hours (Monday - Thursday, 8:00 AM - 4:00 PM) (Friday, 9:00 AM - 12:00 Noon): [phone]  After hours: [phone]  [phone] _____________________________________________________________________________________________  Sacroiliac (SI) Joint Injection Patient Information  Description: The sacroiliac joint connects the scrum (very low back and tailbone) to the ilium (a pelvic bone which also forms half of the hip joint).  Normally this joint experiences very little motion.  When this joint becomes inflamed or unstable low back and or hip and pelvis pain may result.  Injection of this joint with local anesthetics (numbing medicines) and steroids can provide diagnostic information and reduce pain.  This injection is performed with the aid of x-ray guidance into the tailbone area while you are lying on your stomach.   You may experience an electrical sensation down the leg while this is being done.  You may also experience numbness.  We also may ask if we are reproducing your normal pain during the injection.  Conditions which may be treated SI injection:  Low back, buttock, hip or leg pain  Preparation for the Injection:  Do not eat any solid food or dairy products within 8 hours of your appointment.  You may drink clear liquids up to 3 hours before appointment.  Clear liquids include water, black coffee, juice or soda.  No milk or cream please. You may take your regular medications, including pain medications with a sip of water before your appointment.  Diabetics should hold regular insulin (if take separately) and take 1/2 normal NPH dose the morning of the procedure.  Carry some sugar containing items with you to your appointment. A driver must accompany you and be prepared to drive you home after your procedure. Bring all of your current medications with you. An IV may be inserted and sedation may be given at the discretion of the physician. A blood pressure cuff, EKG and other monitors will often be applied during the procedure.  Some patients may need to have extra oxygen administered for a short period.   You will be asked to provide medical information, including your allergies, prior to the procedure.  We must know immediately if you are taking blood thinners (like Coumadin/Warfarin) or if you are allergic to IV iodine contrast (dye).  We must know if you could possible be pregnant.  Possible side effects:  Bleeding from needle site Infection (rare, may require surgery) Nerve injury (rare) Numbness & tingling (temporary) A brief convulsion or seizure Light-headedness (temporary) Pain at injection site (several days) Decreased blood pressure (temporary) Weakness in the leg (temporary)   Call if you experience:  New onset weakness or numbness of an extremity below the injection site that last more than 8 hours. Hives or difficulty breathing ( go to the emergency room) Inflammation or drainage at the injection site Any new symptoms which are concerning to you  Please note:  Although the local anesthetic injected can often make your back/ hip/ buttock/ leg feel good for several hours after the injections, the pain will likely return.  It takes 3-7 days for steroids to work in the sacroiliac area.  You may not notice any pain relief for at least  that one week.  If effective, we will often do a series of three injections spaced 3-6 weeks apart to maximally decrease your pain.  After the initial series, we generally will wait some months before a repeat injection of the same type.  If you have any questions, please call [phone] [place]  What are the risk, side effects and possible complications? Generally speaking, most procedures are safe.  However, with any procedure there are risks, side effects, and the possibility of complications.  The risks and complications are dependent upon the sites that are lesioned, or the type of nerve block to be performed.  The closer the procedure is to the spine, the more  serious the risks are.  Great care is taken when placing the radio frequency needles, block needles or lesioning probes, but sometimes complications can occur. 2. Infection: Any time there is an injection through the skin, there is a risk of infection.  This is why sterile conditions are used for these blocks.  There are four possible types of infection. 1. Localized skin infection. 2. Central Nervous System Infection-This can be in the form of Meningitis, which can be deadly. 3. Epidural Infections-This can be in the form of an epidural abscess, which can cause pressure inside of the spine, causing compression of the spinal cord with subsequent paralysis. This would require an emergency surgery to decompress, and there are no guarantees that the patient would recover from the paralysis. 4. Discitis-This is an infection of the intervertebral discs.  It occurs in about 1% of discography procedures.  It is difficult to treat and it may lead to surgery.        2. Pain: the needles have to go through skin and soft tissues, will cause soreness.       3. Damage to internal structures:  The nerves to be lesioned may be near blood vessels or    other nerves which can be potentially damaged.       4. Bleeding: Bleeding is more common if the patient is taking blood thinners such as  aspirin, Coumadin, Ticiid, Plavix, etc., or if he/she have some genetic predisposition  such as hemophilia. Bleeding into the spinal canal can cause compression of the spinal  cord with subsequent paralysis.  This would require an emergency surgery to  decompress and there are no guarantees that the patient would recover from the  paralysis.       5. Pneumothorax:  Puncturing of a lung is a possibility, every time a needle is introduced in  the area of the chest or upper back.  Pneumothorax refers to free air around the  collapsed lung(s), inside of the thoracic cavity (chest cavity).  Another two possible  complications related to a  similar event would include: Hemothorax and Chylothorax.   These are variations of the Pneumothorax, where instead of air around the collapsed  lung(s), you may have blood or chyle, respectively.       6. Spinal headaches: They may occur with any procedures in the area of the spine.       7. Persistent CSF (Cerebro-Spinal Fluid) leakage: This is a rare problem, but may occur  with prolonged intrathecal or epidural catheters either due to the formation of a fistulous  track or a dural tear.       8. Nerve damage: By working so close to the spinal cord, there is always a possibility of  nerve damage, which could be as serious as a permanent spinal cord injury with  paralysis.       9. Death:  Although rare, severe deadly allergic reactions known as "Anaphylactic  reaction" can occur to any of the medications used.      10. Worsening of the symptoms:  We can always make thing worse.  What are the chances of something like this happening? Chances of any of this occuring are extremely low.  By statistics, you have more of a chance of getting killed in a motor vehicle accident: while driving to the hospital than any of the above occurring .  Nevertheless, you should be aware that they are possibilities.  In general, it is similar to taking a shower.  Everybody knows that you can slip, hit your head and get killed.  Does that mean that you should not shower again?  Nevertheless always keep in mind that statistics do not mean anything if you happen to be on the wrong side of them.  Even if a procedure has a 1 (one) in a 1,000,000 (million) chance of going wrong, it you happen to be that one..Also, keep in mind that by statistics, you have more of a chance of having something go wrong when taking medications.  Who should not have this procedure? If you are on a blood thinning medication (e.g. Coumadin, Plavix, see list of "Blood Thinners"), or if you have an active infection going on, you should not have the  procedure.  If you are taking any blood thinners, please inform your physician.  How should I prepare for this procedure?  Do not eat or drink anything at least six hours prior to the procedure.  Bring a driver with you .  It cannot be a taxi.  Come accompanied by an adult that can drive you back, and that is strong enough to help you if your legs get weak or numb from the local anesthetic.  Take all of your medicines the morning of the procedure with just enough water to swallow them.  If you have diabetes, make sure that you are scheduled to have your procedure done first thing in the morning, whenever possible.  If you have diabetes, take only half of your insulin dose and notify our nurse that you have done so as soon as you arrive at the clinic.  If you are diabetic, but only take blood sugar pills (oral hypoglycemic), then do not take them on the morning of your procedure.  You may take them after you have had the procedure.  Do not take aspirin or any aspirin-containing medications, at least eleven (11) days prior to the procedure.  They may prolong bleeding.  Wear loose fitting clothing that may be easy to take off and that you would not mind if it got stained with Betadine or blood.  Do not wear any jewelry or perfume  Remove any nail coloring.  It will interfere with some of our monitoring equipment.  NOTE: Remember that this is not meant to be interpreted as a complete list of all possible complications.  Unforeseen problems may occur.  BLOOD THINNERS The following drugs contain aspirin or other products, which can cause increased bleeding during surgery and should not be taken for 2 weeks prior to and 1 week after surgery.  If you should need take something for relief of minor pain, you may take acetaminophen which is found in Tylenol,m Datril, Anacin-3 and Panadol. It is not blood thinner.  The products listed below are.  Do not take any of the products listed below in  addition to any listed on your instruction sheet.  A.P.C or A.P.C with Codeine Codeine Phosphate Capsules #3 Ibuprofen Ridaura  ABC compound Congesprin Imuran rimadil  Advil Cope Indocin Robaxisal  Alka-Seltzer Effervescent Pain Reliever and Antacid Coricidin or Coricidin-D  Indomethacin Rufen  Alka-Seltzer plus Cold Medicine Cosprin Ketoprofen S-A-C Tablets  Anacin Analgesic Tablets or Capsules Coumadin Korlgesic Salflex  Anacin Extra Strength Analgesic tablets or capsules CP-2 Tablets Lanoril Salicylate  Anaprox Cuprimine Capsules Levenox Salocol  Anexsia-D Dalteparin Magan Salsalate  Anodynos Darvon compound Magnesium Salicylate Sine-off  Ansaid Dasin Capsules Magsal Sodium Salicylate  Anturane Depen Capsules Marnal Soma  APF Arthritis pain formula Dewitt's Pills Measurin Stanback  Argesic Dia-Gesic Meclofenamic Sulfinpyrazone  Arthritis Bayer Timed Release Aspirin Diclofenac Meclomen Sulindac  Arthritis pain formula Anacin Dicumarol Medipren Supac  Analgesic (Safety coated) Arthralgen Diffunasal Mefanamic Suprofen  Arthritis Strength Bufferin Dihydrocodeine Mepro Compound Suprol  Arthropan liquid Dopirydamole Methcarbomol with Aspirin Synalgos  ASA tablets/Enseals Disalcid Micrainin Tagament  Ascriptin Doan's Midol Talwin  Ascriptin A/D Dolene Mobidin Tanderil  Ascriptin Extra Strength Dolobid Moblgesic Ticlid  Ascriptin with Codeine Doloprin or Doloprin with Codeine Momentum Tolectin  Asperbuf Duoprin Mono-gesic Trendar  Aspergum Duradyne Motrin or Motrin IB Triminicin  Aspirin plain, buffered or enteric coated Durasal Myochrisine Trigesic  Aspirin Suppositories Easprin Nalfon Trillsate  Aspirin with Codeine Ecotrin Regular or Extra Strength Naprosyn Uracel  Atromid-S Efficin Naproxen Ursinus  Auranofin Capsules Elmiron Neocylate Vanquish  Axotal Emagrin Norgesic Verin  Azathioprine Empirin or Empirin with Codeine Normiflo Vitamin E  Azolid Emprazil Nuprin Voltaren  Bayer  Aspirin plain, buffered or children's or timed BC Tablets or powders Encaprin Orgaran Warfarin Sodium  Buff-a-Comp Enoxaparin Orudis Zorpin  Buff-a-Comp with Codeine Equegesic Os-Cal-Gesic   Buffaprin Excedrin plain, buffered or Extra Strength Oxalid   Bufferin Arthritis Strength Feldene Oxphenbutazone   Bufferin plain or Extra Strength Feldene Capsules Oxycodone with Aspirin   Bufferin with Codeine Fenoprofen Fenoprofen Pabalate or Pabalate-SF   Buffets II Flogesic Panagesic   Buffinol plain or Extra Strength Florinal or Florinal with Codeine Panwarfarin   Buf-Tabs Flurbiprofen Penicillamine   Butalbital Compound Four-way cold tablets Penicillin   Butazolidin Fragmin Pepto-Bismol   Carbenicillin Geminisyn Percodan   Carna Arthritis Reliever Geopen Persantine   Carprofen Gold's salt Persistin   Chloramphenicol Goody's Phenylbutazone   Chloromycetin Haltrain Piroxlcam   Clmetidine heparin Plaquenil   Cllnoril Hyco-pap Ponstel   Clofibrate Hydroxy chloroquine Propoxyphen         Before stopping any of these medications, be sure to consult the physician who ordered them.  Some, such as Coumadin (Warfarin) are ordered to prevent or treat serious conditions such as "deep thrombosis", "pumonary embolisms", and other heart problems.  The amount of time that you may need off of the medication may also vary with the medication and the reason for which you were taking it.  If you are taking any of these medications, please make sure you notify your pain physician before you undergo any procedures.

## 2017-01-04 ENCOUNTER — Telehealth: Payer: Self-pay | Admitting: *Deleted

## 2017-01-04 NOTE — Telephone Encounter (Signed)
Left voicemail for patient to return call for any post procedure issues. 

## 2017-01-05 ENCOUNTER — Telehealth: Payer: Self-pay | Admitting: Pain Medicine

## 2017-01-05 NOTE — Telephone Encounter (Signed)
Patient had a procedure on Monday and would like to speak with a Nurse about it.

## 2017-01-05 NOTE — Telephone Encounter (Signed)
Spoke with Ricky Mercer and he is c/o his back being red like a sunburn that he noticed on yesterday and this morning.  The redness is over the entire back up to the shoulders and down the arms a little bit.  Denies itching but may feel a "little burny" at times.  Will speak with Dr Dossie Arbour re; this issue and call patient back.

## 2017-01-05 NOTE — Telephone Encounter (Signed)
Spoke with patient and clarified symptoms.  States he has some redness in his back, feeling like a sunburn and in his face a bit.  Does not think he is running a fever.  Denies bowel/bladder problems.  Denies weakness or increased pain.  Informed patient that the redness was more than likely from the steroid, but to monitor his temperature and if he has any further symptoms or concerns to call us back or go to the  ED.  Will discuss with Dr Dossie Arbour and call patient back if there are any further suggestions from Dr Dossie Arbour.

## 2017-01-27 ENCOUNTER — Encounter: Payer: Self-pay | Admitting: Pain Medicine

## 2017-01-27 ENCOUNTER — Ambulatory Visit: Payer: BLUE CROSS/BLUE SHIELD | Attending: Pain Medicine | Admitting: Pain Medicine

## 2017-01-27 VITALS — BP 121/80 | HR 92 | Temp 98.0°F | Resp 16 | Ht 69.0 in | Wt 185.0 lb

## 2017-01-27 DIAGNOSIS — M5442 Lumbago with sciatica, left side: Secondary | ICD-10-CM | POA: Diagnosis not present

## 2017-01-27 DIAGNOSIS — M47816 Spondylosis without myelopathy or radiculopathy, lumbar region: Secondary | ICD-10-CM | POA: Diagnosis not present

## 2017-01-27 DIAGNOSIS — Z5181 Encounter for therapeutic drug level monitoring: Secondary | ICD-10-CM | POA: Diagnosis not present

## 2017-01-27 DIAGNOSIS — M47896 Other spondylosis, lumbar region: Secondary | ICD-10-CM | POA: Insufficient documentation

## 2017-01-27 DIAGNOSIS — Z79899 Other long term (current) drug therapy: Secondary | ICD-10-CM | POA: Diagnosis not present

## 2017-01-27 DIAGNOSIS — Z7982 Long term (current) use of aspirin: Secondary | ICD-10-CM | POA: Diagnosis not present

## 2017-01-27 DIAGNOSIS — M4696 Unspecified inflammatory spondylopathy, lumbar region: Secondary | ICD-10-CM

## 2017-01-27 DIAGNOSIS — Z79891 Long term (current) use of opiate analgesic: Secondary | ICD-10-CM | POA: Insufficient documentation

## 2017-01-27 DIAGNOSIS — G8929 Other chronic pain: Secondary | ICD-10-CM | POA: Diagnosis not present

## 2017-01-27 DIAGNOSIS — M545 Low back pain: Secondary | ICD-10-CM | POA: Insufficient documentation

## 2017-01-27 DIAGNOSIS — M533 Sacrococcygeal disorders, not elsewhere classified: Secondary | ICD-10-CM | POA: Diagnosis not present

## 2017-01-27 DIAGNOSIS — M5441 Lumbago with sciatica, right side: Secondary | ICD-10-CM

## 2017-01-27 NOTE — Progress Notes (Signed)
Patient's Name: Ricky Mercer  MRN: [id number]  Referring Provider: Sofie Hartigan, MD  DOB: 1951-04-06  PCP: Sofie Hartigan, MD  DOS: 01/27/2017  Note by: Kathlen Brunswick. Dossie Arbour, MD  Service setting: Ambulatory outpatient  Specialty: Interventional Pain Management  Location: [facility] (AMB) Pain Management Facility    Patient type: Established   Primary Reason(s) for Visit: Encounter for post-procedure evaluation of chronic illness with mild to moderate exacerbation CC: Back Pain (mid)  HPI  Ricky Mercer is a 66 y.o. year old, male patient, who comes today for a post-procedure evaluation. He has Hand weakness; Polyradiculopathy; Long term current use of opiate analgesic; Long term prescription opiate use; Opiate use (45 MME/Day); Opiate dependence ([place]); Encounter for therapeutic drug level monitoring; Chronic low back pain  (Location of Primary Pain) (Bilateral) (L>R); Chronic lower extremity pain (Location of Tertiary source of pain) (Bilateral) (L>R); Chronic lumbar radicular pain (Bilateral) (L>R) (Bilateral S1 dermatomal distribution); Chronic hip pain (Location of Secondary source of pain) (Bilateral) (L>R); Chronic upper back pain (Bilateral) (L>R); Chronic neck pain (neck<shoulder) (L>R); Chronic foot pain; Chronic knee pain (Right); Cervical spondylosis; 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); Chronic upper extremity pain (Bilateral) (L>R); Lumbar spondylosis; Cervical facet hypertrophy; Cervical facet syndrome (R>L); History of prostate cancer; Radicular pain of shoulder; Class I Morbid obesity (HCC) (68% higher incidence of chronic low back problems); Peripheral neuropathy (HCC) (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); Lumbar facet syndrome (Bilateral) (L>R); Sleep apnea; Depression; High cholesterol; Low testosterone; History of surgical procedure; Opioid-induced constipation (OIC); Chronic shoulder pain (Bilateral) (L>R); Breathing orally; Chest pain; Primary  cardiomyopathy ([place]); Chronic sacroiliac joint pain (Bilateral) (L>R); Musculoskeletal pain; Pain of right foot; Neurogenic pain; Chest pain with high risk for cardiac etiology; Chronic pain syndrome; and Bilateral arm pain on his problem list. His primarily concern today is the Back Pain (mid)  Pain Assessment: Self-Reported Pain Score: 5 /10 Clinically the patient looks like a 2/10 Reported level is inconsistent with clinical observations. Information on the proper use of the pain scale provided to the patient today Pain Type: Chronic pain Pain Location: Back Pain Orientation: Mid Pain Descriptors / Indicators: Aching, Constant, Burning, Radiating, Tiring, Sharp, Shooting Pain Frequency: Constant  Ricky Mercer comes in today for post-procedure evaluation after the treatment done on 01/05/2017.  Further details on both, my assessment(s), as well as the proposed treatment plan, please see below.  Post-Procedure Assessment  01/05/2017 Procedure: Diagnostic bilateral lumbar facet block #3+ diagnostic bilateral sacroiliac joint block #2 under fluoroscopic guidance and IV sedation Pre-procedure pain score:  5/10 Post-procedure pain score: 2/10         Influential Factors: BMI: 27.32 kg/m Intra-procedural challenges: None observed Assessment challenges: None detected         Post-procedural side-effects, adverse reactions, or complications: None reported Reported issues: None  Sedation: Sedation provided. When no sedatives are used, the analgesic levels obtained are directly associated to the effectiveness of the local anesthetics. However, when sedation is provided, the level of analgesia obtained during the initial 1 hour following the intervention, is believed to be the result of a combination of factors. These factors may include, but are not limited to: 1. The effectiveness of the local anesthetics used. 2. The effects of the analgesic(s) and/or anxiolytic(s) used. 3. The degree of  discomfort experienced by the patient at the time of the procedure. 4. The patients ability and reliability in recalling and recording the events. 5. The presence and influence of possible secondary  gains and/or psychosocial factors. Reported result: Relief experienced during the 1st hour after the procedure: 100 % (Ultra-Short Term Relief) Interpretative annotation: Analgesia during this period is likely to be Local Anesthetic and/or IV Sedative (Analgesic/Anxiolitic) related.          Effects of local anesthetic: The analgesic effects attained during this period are directly associated to the localized infiltration of local anesthetics and therefore cary significant diagnostic value as to the etiological location, or anatomical origin, of the pain. Expected duration of relief is directly dependent on the pharmacodynamics of the local anesthetic used. Long-acting (4-6 hours) anesthetics used.  Reported result: Relief during the next 4 to 6 hour after the procedure: 80 % (Short-Term Relief) Interpretative annotation: Partial relief would suggest incomplete involvement of injected area.          Long-term benefit: Defined as the period of time past the expected duration of local anesthetics. With the possible exception of prolonged sympathetic blockade from the local anesthetics, benefits during this period are typically attributed to, or associated with, other factors such as analgesic sensory neuropraxia, antiinflammatory effects, or beneficial biochemical changes provided by agents other than the local anesthetics Reported result: Extended relief following procedure: 80 % (latest 10 days) (Long-Term Relief) Interpretative annotation: Partial relief. This could suggest the algesic mechanism to be a combination of tissue inflammation and mechanical problems. Relief is believed to be associated to the steroid  Current benefits: Defined as persistent relief that continues at this point in time.    Reported results: Treated area: 0 %       Interpretative annotation: Recurrance of symptoms. This would suggest persistent aggravating factors  Interpretation: Results would suggest a successful diagnostic intervention. The patient has failed to respond to conservative therapies including over-the-counter medications, anti-inflammatories, muscle relaxants, membrane stabilizers, opioids, physical therapy, modalities such as heat and ice, as well as more invasive techniques such as nerve blocks. Because Mr. Zappone did attain more than 50% relief of the pain during a series of diagnostic blocks conducted in separate occasions, I believe it is medically necessary to proceed with Radiofrequency Ablation, in order to attempt gaining longer relief.  Laboratory Chemistry  Inflammation Markers Lab Results  Component Value Date   CRP <0.5 08/21/2015   ESRSEDRATE 4 08/21/2015   (CRP: Acute Phase) (ESR: Chronic Phase) Renal Function Markers Lab Results  Component Value Date   BUN 13 11/25/2015   CREATININE 0.96 11/25/2015   GFRAA >60 11/25/2015   GFRNONAA >60 11/25/2015   Hepatic Function Markers Lab Results  Component Value Date   AST 31 11/25/2015   ALT 41 11/25/2015   ALBUMIN 3.9 11/25/2015   ALKPHOS 71 11/25/2015   Electrolytes Lab Results  Component Value Date   NA 138 11/25/2015   K 4.2 11/25/2015   CL 104 11/25/2015   CALCIUM 9.0 11/25/2015   MG 2.1 08/21/2015   Neuropathy Markers No results found for: [id number] Bone Pathology Markers Lab Results  Component Value Date   ALKPHOS 71 11/25/2015   CALCIUM 9.0 11/25/2015   Coagulation Parameters Lab Results  Component Value Date   PLT 238 11/25/2015   Cardiovascular Markers Lab Results  Component Value Date   HGB 14.6 11/25/2015   HCT 43.5 11/25/2015   Note: Lab results reviewed.  Recent Diagnostic Imaging Review  Dg C-arm 1-60 Min-no Report  Result Date: 01/03/2017 Fluoroscopy was utilized by the requesting  physician.  No radiographic interpretation.   Note: Imaging results reviewed.  Meds  The patient has a current medication list which includes the following prescription(s): androgel pump, aspirin, cyclobenzaprine, oxycodone, oxycodone, oxycodone, pantoprazole, simvastatin, and venlafaxine xr.  Current Outpatient Prescriptions on File Prior to Visit  Medication Sig  . ANDROGEL PUMP 20.25 MG/ACT (1.62%) GEL APPLY TO 2 PUMPS ONCE A DAY AS DIRECTED  . aspirin 81 MG tablet Take 81 mg by mouth daily.  . cyclobenzaprine (FLEXERIL) 10 MG tablet Take 1 tablet (10 mg total) by mouth 3 (three) times daily as needed for muscle spasms.  Derrill Memo ON 02/02/2017] oxyCODONE (OXY IR/ROXICODONE) 5 MG immediate release tablet Take 1 tablet (5 mg total) by mouth every 8 (eight) hours as needed for severe pain.  Marland Kitchen oxyCODONE (OXY IR/ROXICODONE) 5 MG immediate release tablet Take 1 tablet (5 mg total) by mouth every 8 (eight) hours as needed for severe pain.  Derrill Memo ON 03/04/2017] oxyCODONE (OXY IR/ROXICODONE) 5 MG immediate release tablet Take 1 tablet (5 mg total) by mouth every 8 (eight) hours as needed for severe pain.  . pantoprazole (PROTONIX) 40 MG tablet Take 40 mg by mouth daily.  . simvastatin (ZOCOR) 10 MG tablet Take 10 mg by mouth at bedtime.   Marland Kitchen venlafaxine XR (EFFEXOR-XR) 37.5 MG 24 hr capsule    No current facility-administered medications on file prior to visit.    ROS  Constitutional: Denies any fever or chills Gastrointestinal: No reported hemesis, hematochezia, vomiting, or acute GI distress Musculoskeletal: Denies any acute onset joint swelling, redness, loss of ROM, or weakness Neurological: No reported episodes of acute onset apraxia, aphasia, dysarthria, agnosia, amnesia, paralysis, loss of coordination, or loss of consciousness  Allergies  Mr. Mawson has No Known Allergies.  PFSH  Drug: Mr. Skidmore  reports that he does not use drugs. Alcohol:  reports that he does not  drink alcohol. Tobacco:  reports that he has never smoked. He has never used smokeless tobacco. Medical:  has a past medical history of Bilateral arm pain; Cardiomyopathy ([place]); Diverticulosis; Dupuytren's contracture; Foot pain, bilateral; GERD (gastroesophageal reflux disease); Hand weakness; Hyperlipidemia; Knee pain; Midline low back pain; Osteoarthritis; Pain in neck; Prostate cancer ([place]); Reactive airway disease; Seasonal allergies; Situational anxiety; and Sleep apnea. Family: family history includes Colon cancer in his brother; Hyperlipidemia in his sister; Lung cancer in his father; Prostate cancer in his brother.  Past Surgical History:  Procedure Laterality Date  . CHOLECYSTECTOMY N/A 12/02/2015   Procedure: LAPAROSCOPIC CHOLECYSTECTOMY;  Surgeon: Leonie Green, MD;  Location: [facility] ORS;  Service: General;  Laterality: N/A;  . COLONOSCOPY  10/24/2012  . ESOPHAGOGASTRODUODENOSCOPY (EGD) WITH PROPOFOL N/A 10/28/2015   Procedure: ESOPHAGOGASTRODUODENOSCOPY (EGD) WITH PROPOFOL;  Surgeon: Lollie Sails, MD;  Location: [facility];  Service: Endoscopy;  Laterality: N/A;  . HAND SURGERY Bilateral   . HERNIA REPAIR    . MASS EXCISION Right 07/18/2015   Procedure: EXCISION MASS RIGHT HAND;  Surgeon: Leanor Kail, MD;  Location: [facility];  Service: Orthopedics;  Laterality: Right;  CPAP  . NOSE SURGERY    . prostate seeding    . SKIN CANCER EXCISION Left 2015   shoulder  . TONSILLECTOMY  2009   Surgery for OSA (screw in place)  . UPPER GI ENDOSCOPY  12/18/2012   x3   Constitutional Exam  General appearance: Well nourished, well developed, and well hydrated. In no apparent acute distress Vitals:   01/27/17 1354  BP: 121/80  Pulse: 92  Resp: 16  Temp: 98 F (36.7 C)  TempSrc:  Oral  SpO2: 97%  Weight: 185 lb (83.9 kg)  Height: _0  (1.753 m)   BMI Assessment: Estimated body mass index is 27.32 kg/m as calculated from the following:   Height as of this  encounter: _1  (1.753 m).   Weight as of this encounter: 185 lb (83.9 kg).  BMI interpretation table: BMI level Category Range association with higher incidence of chronic pain  <18 kg/m2 Underweight   18.5-24.9 kg/m2 Ideal body weight   25-29.9 kg/m2 Overweight Increased incidence by 20%  30-34.9 kg/m2 Obese (Class I) Increased incidence by 68%  35-39.9 kg/m2 Severe obesity (Class II) Increased incidence by 136%  >40 kg/m2 Extreme obesity (Class III) Increased incidence by 254%   BMI Readings from Last 4 Encounters:  01/27/17 27.32 kg/m  01/03/17 27.32 kg/m  12/16/16 27.32 kg/m  11/08/16 27.32 kg/m   Wt Readings from Last 4 Encounters:  01/27/17 185 lb (83.9 kg)  01/03/17 185 lb (83.9 kg)  12/16/16 185 lb (83.9 kg)  11/08/16 185 lb (83.9 kg)  Psych/Mental status: Alert, oriented x 3 (person, place, & time)       Eyes: PERLA Respiratory: No evidence of acute respiratory distress  Cervical Spine Exam  Inspection: No masses, redness, or swelling Alignment: Symmetrical Functional ROM: Unrestricted ROM      Stability: No instability detected Muscle strength & Tone: Functionally intact Sensory: Unimpaired Palpation: No palpable anomalies              Upper Extremity (UE) Exam    Side: Right upper extremity  Side: Left upper extremity  Inspection: No masses, redness, swelling, or asymmetry. No contractures  Inspection: No masses, redness, swelling, or asymmetry. No contractures  Functional ROM: Unrestricted ROM          Functional ROM: Unrestricted ROM          Muscle strength & Tone: Functionally intact  Muscle strength & Tone: Functionally intact  Sensory: Unimpaired  Sensory: Unimpaired  Palpation: No palpable anomalies              Palpation: No palpable anomalies              Specialized Test(s): Deferred         Specialized Test(s): Deferred          Thoracic Spine Exam  Inspection: No masses, redness, or swelling Alignment: Symmetrical Functional ROM:  Unrestricted ROM Stability: No instability detected Sensory: Unimpaired Muscle strength & Tone: No palpable anomalies  Lumbar Spine Exam  Inspection: No masses, redness, or swelling Alignment: Symmetrical Functional ROM: Limited ROM      Stability: No instability detected Muscle strength & Tone: Functionally intact Sensory: Movement-associated pain Palpation: Complains of area being tender to palpation       Provocative Tests: Lumbar Hyperextension and rotation test: Positive bilaterally for facet joint pain. Patrick's Maneuver: Positive for bilateral S-I arthralgia              Gait & Posture Assessment  Ambulation: Unassisted Gait: Antalgic Posture: WNL   Lower Extremity Exam    Side: Right lower extremity  Side: Left lower extremity  Inspection: No masses, redness, swelling, or asymmetry. No contractures  Inspection: No masses, redness, swelling, or asymmetry. No contractures  Functional ROM: Unrestricted ROM          Functional ROM: Unrestricted ROM          Muscle strength & Tone: Functionally intact  Muscle strength & Tone: Functionally intact  Sensory: Unimpaired  Sensory:  Unimpaired  Palpation: No palpable anomalies  Palpation: No palpable anomalies   Assessment  Primary Diagnosis & Pertinent Problem List: The primary encounter diagnosis was Lumbar facet syndrome (Bilateral) (L>R). Diagnoses of Lumbar spondylosis, Chronic low back pain  (Location of Primary Pain) (Bilateral) (L>R), and Chronic sacroiliac joint pain (Bilateral) (L>R) were also pertinent to this visit.  Status Diagnosis  Persistent Stable Recurring 1. Lumbar facet syndrome (Bilateral) (L>R)   2. Lumbar spondylosis   3. Chronic low back pain  (Location of Primary Pain) (Bilateral) (L>R)   4. Chronic sacroiliac joint pain (Bilateral) (L>R)     Problems updated and reviewed during this visit: No problems updated. Plan of Care  Pharmacotherapy (Medications Ordered): No orders of the defined types were  placed in this encounter.  New Prescriptions   No medications on file   Medications administered today: Mr. Liotta had no medications administered during this visit. Lab-work, procedure(s), and/or referral(s): Orders Placed This Encounter  Procedures  . Radiofrequency,Lumbar  . Radiofrequency Sacroiliac Joint   Imaging and/or referral(s): None  Interventional therapies: Planned, scheduled, and/or pending:   Schedule left SI + Lumbar Facet RFA. The right side will follow two weeks later.   Considering:   Palliative Left cervical epidural steroid injection  Possible bilateral sacroiliac joint + lumbar facet RFA Diagnostic Bilateral Hip Block Diagnostic bilateral femoral nerve and obturator nerve block Possible bilateral femoral nerve and obturator nerve RFA Diagnostic bilateral cervical facet block. Possible bilateral cervical facet RFA. Diagnostic Caudal ESI + diagnostic epidurogram Diagnostic Right Knee injection. Possible series of 5 right-sided intra-articular Hyalgan knee injections Diagnostic right Genicular NB Possible right Genicular Nerve RFA. Diagnostic bilateral intra-articular shoulder injection Diagnostic bilateral suprascapular NB. Possible bilateral suprascapular Nerve RFA.   Palliative PRN treatment(s):   Diagnostic Bilateral Lumbar Facet & Bilateral S-I joint block  Diagnostic Bilateral Hip Block Diagnostic bilateral cervical facet block. Diagnostic Caudal ESI. Diagnostic Left CESI. Diagnostic Right Knee injection. Diagnostic right Genicular NB Diagnostic bilateral intra-articular shoulder inj. Diagnostic bilateral suprascapular NB.   Provider-requested follow-up: Return for RFA, w/ MD, in addition, Med-Mgmt, w/ NP.  Future Appointments Date Time Provider [place]  03/17/2017 1:00 PM Vevelyn Francois, NP [facility] None   Primary Care Physician: Sofie Hartigan, MD Location: [facility] Note by: Kathlen Brunswick.  Dossie Arbour, M.D, DABA, DABAPM, DABPM, DABIPP, FIPP Date: 01/27/2017; Time: 5:34 PM  Patient instructions provided during this appointment: Patient Instructions   _______________________________________________________________  Preparing for Procedure with Sedation Instructions: . Oral Intake: Do not eat or drink anything for at least 8 hours prior to your procedure. . Transportation: Public transportation is not allowed. Bring an adult driver. The driver must be physically present in our waiting room before any procedure can be started. Marland Kitchen Physical Assistance: Bring an adult physically capable of assisting you, in the event you need help. This adult should keep you company at home for at least 6 hours after the procedure. . Blood Pressure Medicine: Take your blood pressure medicine with a sip of water the morning of the procedure. . Blood thinners:  . Diabetics on insulin: Notify the staff so that you can be scheduled 1st case in the morning. If your diabetes requires high dose insulin, take only  of your normal insulin dose the morning of the procedure and notify the staff that you have done so. . Preventing infections: Shower with an antibacterial soap the morning of your procedure. . Build-up your immune system: Take 1000 mg of Vitamin C  with every meal (3 times a day) the day prior to your procedure. Marland Kitchen Antibiotics: Inform the staff if you have a condition or reason that requires you to take antibiotics before dental procedures. . Pregnancy: If you are pregnant, call and cancel the procedure. . Sickness: If you have a cold, fever, or any active infections, call and cancel the procedure. . Arrival: You must be in the facility at least 30 minutes prior to your scheduled procedure. . Children: Do not bring children with you. . Dress appropriately: Bring dark clothing that you would not mind if they get stained. . Valuables: Do not bring any jewelry or valuables. Procedure appointments are  reserved for interventional treatments only. Marland Kitchen No Prescription Refills. . No medication changes will be discussed during procedure appointments. . No disability issues will be discussed. ______________________________________________________________________________________________  GENERAL RISKS AND COMPLICATIONS  What are the risk, side effects and possible complications? Generally speaking, most procedures are safe.  However, with any procedure there are risks, side effects, and the possibility of complications.  The risks and complications are dependent upon the sites that are lesioned, or the type of nerve block to be performed.  The closer the procedure is to the spine, the more serious the risks are.  Great care is taken when placing the radio frequency needles, block needles or lesioning probes, but sometimes complications can occur. 1. Infection: Any time there is an injection through the skin, there is a risk of infection.  This is why sterile conditions are used for these blocks.  There are four possible types of infection. 1. Localized skin infection. 2. Central Nervous System Infection-This can be in the form of Meningitis, which can be deadly. 3. Epidural Infections-This can be in the form of an epidural abscess, which can cause pressure inside of the spine, causing compression of the spinal cord with subsequent paralysis. This would require an emergency surgery to decompress, and there are no guarantees that the patient would recover from the paralysis. 4. Discitis-This is an infection of the intervertebral discs.  It occurs in about 1% of discography procedures.  It is difficult to treat and it may lead to surgery.        2. Pain: the needles have to go through skin and soft tissues, will cause soreness.       3. Damage to internal structures:  The nerves to be lesioned may be near blood vessels or    other nerves which can be potentially damaged.       4. Bleeding: Bleeding is more  common if the patient is taking blood thinners such as  aspirin, Coumadin, Ticiid, Plavix, etc., or if he/she have some genetic predisposition  such as hemophilia. Bleeding into the spinal canal can cause compression of the spinal  cord with subsequent paralysis.  This would require an emergency surgery to  decompress and there are no guarantees that the patient would recover from the  paralysis.       5. Pneumothorax:  Puncturing of a lung is a possibility, every time a needle is introduced in  the area of the chest or upper back.  Pneumothorax refers to free air around the  collapsed lung(s), inside of the thoracic cavity (chest cavity).  Another two possible  complications related to a similar event would include: Hemothorax and Chylothorax.   These are variations of the Pneumothorax, where instead of air around the collapsed  lung(s), you may have blood or chyle, respectively.  6. Spinal headaches: They may occur with any procedures in the area of the spine.       7. Persistent CSF (Cerebro-Spinal Fluid) leakage: This is a rare problem, but may occur  with prolonged intrathecal or epidural catheters either due to the formation of a fistulous  track or a dural tear.       8. Nerve damage: By working so close to the spinal cord, there is always a possibility of  nerve damage, which could be as serious as a permanent spinal cord injury with  paralysis.       9. Death:  Although rare, severe deadly allergic reactions known as "Anaphylactic  reaction" can occur to any of the medications used.      10. Worsening of the symptoms:  We can always make thing worse.  What are the chances of something like this happening? Chances of any of this occuring are extremely low.  By statistics, you have more of a chance of getting killed in a motor vehicle accident: while driving to the hospital than any of the above occurring .  Nevertheless, you should be aware that they are possibilities.  In general, it is  similar to taking a shower.  Everybody knows that you can slip, hit your head and get killed.  Does that mean that you should not shower again?  Nevertheless always keep in mind that statistics do not mean anything if you happen to be on the wrong side of them.  Even if a procedure has a 1 (one) in a 1,000,000 (million) chance of going wrong, it you happen to be that one..Also, keep in mind that by statistics, you have more of a chance of having something go wrong when taking medications.  Who should not have this procedure? If you are on a blood thinning medication (e.g. Coumadin, Plavix, see list of "Blood Thinners"), or if you have an active infection going on, you should not have the procedure.  If you are taking any blood thinners, please inform your physician.  How should I prepare for this procedure?  Do not eat or drink anything at least six hours prior to the procedure.  Bring a driver with you .  It cannot be a taxi.  Come accompanied by an adult that can drive you back, and that is strong enough to help you if your legs get weak or numb from the local anesthetic.  Take all of your medicines the morning of the procedure with just enough water to swallow them.  If you have diabetes, make sure that you are scheduled to have your procedure done first thing in the morning, whenever possible.  If you have diabetes, take only half of your insulin dose and notify our nurse that you have done so as soon as you arrive at the clinic.  If you are diabetic, but only take blood sugar pills (oral hypoglycemic), then do not take them on the morning of your procedure.  You may take them after you have had the procedure.  Do not take aspirin or any aspirin-containing medications, at least eleven (11) days prior to the procedure.  They may prolong bleeding.  Wear loose fitting clothing that may be easy to take off and that you would not mind if it got stained with Betadine or blood.  Do not wear any  jewelry or perfume  Remove any nail coloring.  It will interfere with some of our monitoring equipment.  NOTE: Remember that this is  not meant to be interpreted as a complete list of all possible complications.  Unforeseen problems may occur.  BLOOD THINNERS The following drugs contain aspirin or other products, which can cause increased bleeding during surgery and should not be taken for 2 weeks prior to and 1 week after surgery.  If you should need take something for relief of minor pain, you may take acetaminophen which is found in Tylenol,m Datril, Anacin-3 and Panadol. It is not blood thinner. The products listed below are.  Do not take any of the products listed below in addition to any listed on your instruction sheet.  A.P.C or A.P.C with Codeine Codeine Phosphate Capsules #3 Ibuprofen Ridaura  ABC compound Congesprin Imuran rimadil  Advil Cope Indocin Robaxisal  Alka-Seltzer Effervescent Pain Reliever and Antacid Coricidin or Coricidin-D  Indomethacin Rufen  Alka-Seltzer plus Cold Medicine Cosprin Ketoprofen S-A-C Tablets  Anacin Analgesic Tablets or Capsules Coumadin Korlgesic Salflex  Anacin Extra Strength Analgesic tablets or capsules CP-2 Tablets Lanoril Salicylate  Anaprox Cuprimine Capsules Levenox Salocol  Anexsia-D Dalteparin Magan Salsalate  Anodynos Darvon compound Magnesium Salicylate Sine-off  Ansaid Dasin Capsules Magsal Sodium Salicylate  Anturane Depen Capsules Marnal Soma  APF Arthritis pain formula Dewitt's Pills Measurin Stanback  Argesic Dia-Gesic Meclofenamic Sulfinpyrazone  Arthritis Bayer Timed Release Aspirin Diclofenac Meclomen Sulindac  Arthritis pain formula Anacin Dicumarol Medipren Supac  Analgesic (Safety coated) Arthralgen Diffunasal Mefanamic Suprofen  Arthritis Strength Bufferin Dihydrocodeine Mepro Compound Suprol  Arthropan liquid Dopirydamole Methcarbomol with Aspirin Synalgos  ASA tablets/Enseals Disalcid Micrainin Tagament  Ascriptin Doan's  Midol Talwin  Ascriptin A/D Dolene Mobidin Tanderil  Ascriptin Extra Strength Dolobid Moblgesic Ticlid  Ascriptin with Codeine Doloprin or Doloprin with Codeine Momentum Tolectin  Asperbuf Duoprin Mono-gesic Trendar  Aspergum Duradyne Motrin or Motrin IB Triminicin  Aspirin plain, buffered or enteric coated Durasal Myochrisine Trigesic  Aspirin Suppositories Easprin Nalfon Trillsate  Aspirin with Codeine Ecotrin Regular or Extra Strength Naprosyn Uracel  Atromid-S Efficin Naproxen Ursinus  Auranofin Capsules Elmiron Neocylate Vanquish  Axotal Emagrin Norgesic Verin  Azathioprine Empirin or Empirin with Codeine Normiflo Vitamin E  Azolid Emprazil Nuprin Voltaren  Bayer Aspirin plain, buffered or children's or timed BC Tablets or powders Encaprin Orgaran Warfarin Sodium  Buff-a-Comp Enoxaparin Orudis Zorpin  Buff-a-Comp with Codeine Equegesic Os-Cal-Gesic   Buffaprin Excedrin plain, buffered or Extra Strength Oxalid   Bufferin Arthritis Strength Feldene Oxphenbutazone   Bufferin plain or Extra Strength Feldene Capsules Oxycodone with Aspirin   Bufferin with Codeine Fenoprofen Fenoprofen Pabalate or Pabalate-SF   Buffets II Flogesic Panagesic   Buffinol plain or Extra Strength Florinal or Florinal with Codeine Panwarfarin   Buf-Tabs Flurbiprofen Penicillamine   Butalbital Compound Four-way cold tablets Penicillin   Butazolidin Fragmin Pepto-Bismol   Carbenicillin Geminisyn Percodan   Carna Arthritis Reliever Geopen Persantine   Carprofen Gold's salt Persistin   Chloramphenicol Goody's Phenylbutazone   Chloromycetin Haltrain Piroxlcam   Clmetidine heparin Plaquenil   Cllnoril Hyco-pap Ponstel   Clofibrate Hydroxy chloroquine Propoxyphen         Before stopping any of these medications, be sure to consult the physician who ordered them.  Some, such as Coumadin (Warfarin) are ordered to prevent or treat serious conditions such as "deep thrombosis", "pumonary embolisms", and other heart  problems.  The amount of time that you may need off of the medication may also vary with the medication and the reason for which you were taking it.  If you are taking any of these medications, please make sure  you notify your pain physician before you undergo any procedures.         Radiofrequency Lesioning Radiofrequency lesioning is a procedure that is performed to relieve pain. The procedure is often used for back, neck, or arm pain. Radiofrequency lesioning involves the use of a machine that creates radio waves to make heat. During the procedure, the heat is applied to the nerve that carries the pain signal. The heat damages the nerve and interferes with the pain signal. Pain relief usually starts about 2 weeks after the procedure and lasts for 6 months to 1 year. Tell a health care provider about:  Any allergies you have.  All medicines you are taking, including vitamins, herbs, eye drops, creams, and over-the-counter medicines.  Any problems you or family members have had with anesthetic medicines.  Any blood disorders you have.  Any surgeries you have had.  Any medical conditions you have.  Whether you are pregnant or may be pregnant. What are the risks? Generally, this is a safe procedure. However, problems may occur, including:  Pain or soreness at the injection site.  Infection at the injection site.  Damage to nerves or blood vessels. What happens before the procedure?  Ask your health care provider about:  Changing or stopping your regular medicines. This is especially important if you are taking diabetes medicines or blood thinners.  Taking medicines such as aspirin and ibuprofen. These medicines can thin your blood. Do not take these medicines before your procedure if your health care provider instructs you not to.  Follow instructions from your health care provider about eating or drinking restrictions.  Plan to have someone take you home after the  procedure.  If you go home right after the procedure, plan to have someone with you for 24 hours. What happens during the procedure?  You will be given one or more of the following:  A medicine to help you relax (sedative).  A medicine to numb the area (local anesthetic).  You will be awake during the procedure. You will need to be able to talk with the health care provider during the procedure.  With the help of a type of X-ray (fluoroscopy), the health care provider will insert a radiofrequency needle into the area to be treated.  Next, a wire that carries the radio waves (electrode) will be put through the radiofrequency needle. An electrical pulse will be sent through the electrode to verify the correct nerve. You will feel a tingling sensation, and you may have muscle twitching.  Then, the tissue that is around the needle tip will be heated by an electric current that is passed using the radiofrequency machine. This will numb the nerves.  A bandage (dressing) will be put on the insertion area after the procedure is done. The procedure may vary among health care providers and hospitals. What happens after the procedure?  Your blood pressure, heart rate, breathing rate, and blood oxygen level will be monitored often until the medicines you were given have worn off.  Return to your normal activities as directed by your health care provider. This information is not intended to replace advice given to you by your health care provider. Make sure you discuss any questions you have with your health care provider. Document Released: 04/28/2011 Document Revised: 02/05/2016 Document Reviewed: 10/07/2014 Elsevier Interactive Patient Education  2017 [employer].

## 2017-01-27 NOTE — Patient Instructions (Addendum)
_______________________________________________________________  Preparing for Procedure with Sedation Instructions: . Oral Intake: Do not eat or drink anything for at least 8 hours prior to your procedure. . Transportation: Public transportation is not allowed. Bring an adult driver. The driver must be physically present in our waiting room before any procedure can be started. Marland Kitchen Physical Assistance: Bring an adult physically capable of assisting you, in the event you need help. This adult should keep you company at home for at least 6 hours after the procedure. . Blood Pressure Medicine: Take your blood pressure medicine with a sip of water the morning of the procedure. . Blood thinners:  . Diabetics on insulin: Notify the staff so that you can be scheduled 1st case in the morning. If your diabetes requires high dose insulin, take only  of your normal insulin dose the morning of the procedure and notify the staff that you have done so. . Preventing infections: Shower with an antibacterial soap the morning of your procedure. . Build-up your immune system: Take 1000 mg of Vitamin C with every meal (3 times a day) the day prior to your procedure. Marland Kitchen Antibiotics: Inform the staff if you have a condition or reason that requires you to take antibiotics before dental procedures. . Pregnancy: If you are pregnant, call and cancel the procedure. . Sickness: If you have a cold, fever, or any active infections, call and cancel the procedure. . Arrival: You must be in the facility at least 30 minutes prior to your scheduled procedure. . Children: Do not bring children with you. . Dress appropriately: Bring dark clothing that you would not mind if they get stained. . Valuables: Do not bring any jewelry or valuables. Procedure appointments are reserved for interventional treatments only. Marland Kitchen No Prescription Refills. . No medication changes will be discussed during procedure appointments. . No disability issues  will be discussed. ______________________________________________________________________________________________  GENERAL RISKS AND COMPLICATIONS  What are the risk, side effects and possible complications? Generally speaking, most procedures are safe.  However, with any procedure there are risks, side effects, and the possibility of complications.  The risks and complications are dependent upon the sites that are lesioned, or the type of nerve block to be performed.  The closer the procedure is to the spine, the more serious the risks are.  Great care is taken when placing the radio frequency needles, block needles or lesioning probes, but sometimes complications can occur. 1. Infection: Any time there is an injection through the skin, there is a risk of infection.  This is why sterile conditions are used for these blocks.  There are four possible types of infection. 1. Localized skin infection. 2. Central Nervous System Infection-This can be in the form of Meningitis, which can be deadly. 3. Epidural Infections-This can be in the form of an epidural abscess, which can cause pressure inside of the spine, causing compression of the spinal cord with subsequent paralysis. This would require an emergency surgery to decompress, and there are no guarantees that the patient would recover from the paralysis. 4. Discitis-This is an infection of the intervertebral discs.  It occurs in about 1% of discography procedures.  It is difficult to treat and it may lead to surgery.        2. Pain: the needles have to go through skin and soft tissues, will cause soreness.       3. Damage to internal structures:  The nerves to be lesioned may be near blood vessels or  other nerves which can be potentially damaged.       4. Bleeding: Bleeding is more common if the patient is taking blood thinners such as  aspirin, Coumadin, Ticiid, Plavix, etc., or if he/she have some genetic predisposition  such as hemophilia.  Bleeding into the spinal canal can cause compression of the spinal  cord with subsequent paralysis.  This would require an emergency surgery to  decompress and there are no guarantees that the patient would recover from the  paralysis.       5. Pneumothorax:  Puncturing of a lung is a possibility, every time a needle is introduced in  the area of the chest or upper back.  Pneumothorax refers to free air around the  collapsed lung(s), inside of the thoracic cavity (chest cavity).  Another two possible  complications related to a similar event would include: Hemothorax and Chylothorax.   These are variations of the Pneumothorax, where instead of air around the collapsed  lung(s), you may have blood or chyle, respectively.       6. Spinal headaches: They may occur with any procedures in the area of the spine.       7. Persistent CSF (Cerebro-Spinal Fluid) leakage: This is a rare problem, but may occur  with prolonged intrathecal or epidural catheters either due to the formation of a fistulous  track or a dural tear.       8. Nerve damage: By working so close to the spinal cord, there is always a possibility of  nerve damage, which could be as serious as a permanent spinal cord injury with  paralysis.       9. Death:  Although rare, severe deadly allergic reactions known as "Anaphylactic  reaction" can occur to any of the medications used.      10. Worsening of the symptoms:  We can always make thing worse.  What are the chances of something like this happening? Chances of any of this occuring are extremely low.  By statistics, you have more of a chance of getting killed in a motor vehicle accident: while driving to the hospital than any of the above occurring .  Nevertheless, you should be aware that they are possibilities.  In general, it is similar to taking a shower.  Everybody knows that you can slip, hit your head and get killed.  Does that mean that you should not shower again?  Nevertheless always keep  in mind that statistics do not mean anything if you happen to be on the wrong side of them.  Even if a procedure has a 1 (one) in a 1,000,000 (million) chance of going wrong, it you happen to be that one..Also, keep in mind that by statistics, you have more of a chance of having something go wrong when taking medications.  Who should not have this procedure? If you are on a blood thinning medication (e.g. Coumadin, Plavix, see list of "Blood Thinners"), or if you have an active infection going on, you should not have the procedure.  If you are taking any blood thinners, please inform your physician.  How should I prepare for this procedure?  Do not eat or drink anything at least six hours prior to the procedure.  Bring a driver with you .  It cannot be a taxi.  Come accompanied by an adult that can drive you back, and that is strong enough to help you if your legs get weak or numb from the local anesthetic.  Take  all of your medicines the morning of the procedure with just enough water to swallow them.  If you have diabetes, make sure that you are scheduled to have your procedure done first thing in the morning, whenever possible.  If you have diabetes, take only half of your insulin dose and notify our nurse that you have done so as soon as you arrive at the clinic.  If you are diabetic, but only take blood sugar pills (oral hypoglycemic), then do not take them on the morning of your procedure.  You may take them after you have had the procedure.  Do not take aspirin or any aspirin-containing medications, at least eleven (11) days prior to the procedure.  They may prolong bleeding.  Wear loose fitting clothing that may be easy to take off and that you would not mind if it got stained with Betadine or blood.  Do not wear any jewelry or perfume  Remove any nail coloring.  It will interfere with some of our monitoring equipment.  NOTE: Remember that this is not meant to be interpreted as a  complete list of all possible complications.  Unforeseen problems may occur.  BLOOD THINNERS The following drugs contain aspirin or other products, which can cause increased bleeding during surgery and should not be taken for 2 weeks prior to and 1 week after surgery.  If you should need take something for relief of minor pain, you may take acetaminophen which is found in Tylenol,m Datril, Anacin-3 and Panadol. It is not blood thinner. The products listed below are.  Do not take any of the products listed below in addition to any listed on your instruction sheet.  A.P.C or A.P.C with Codeine Codeine Phosphate Capsules #3 Ibuprofen Ridaura  ABC compound Congesprin Imuran rimadil  Advil Cope Indocin Robaxisal  Alka-Seltzer Effervescent Pain Reliever and Antacid Coricidin or Coricidin-D  Indomethacin Rufen  Alka-Seltzer plus Cold Medicine Cosprin Ketoprofen S-A-C Tablets  Anacin Analgesic Tablets or Capsules Coumadin Korlgesic Salflex  Anacin Extra Strength Analgesic tablets or capsules CP-2 Tablets Lanoril Salicylate  Anaprox Cuprimine Capsules Levenox Salocol  Anexsia-D Dalteparin Magan Salsalate  Anodynos Darvon compound Magnesium Salicylate Sine-off  Ansaid Dasin Capsules Magsal Sodium Salicylate  Anturane Depen Capsules Marnal Soma  APF Arthritis pain formula Dewitt's Pills Measurin Stanback  Argesic Dia-Gesic Meclofenamic Sulfinpyrazone  Arthritis Bayer Timed Release Aspirin Diclofenac Meclomen Sulindac  Arthritis pain formula Anacin Dicumarol Medipren Supac  Analgesic (Safety coated) Arthralgen Diffunasal Mefanamic Suprofen  Arthritis Strength Bufferin Dihydrocodeine Mepro Compound Suprol  Arthropan liquid Dopirydamole Methcarbomol with Aspirin Synalgos  ASA tablets/Enseals Disalcid Micrainin Tagament  Ascriptin Doan's Midol Talwin  Ascriptin A/D Dolene Mobidin Tanderil  Ascriptin Extra Strength Dolobid Moblgesic Ticlid  Ascriptin with Codeine Doloprin or Doloprin with Codeine  Momentum Tolectin  Asperbuf Duoprin Mono-gesic Trendar  Aspergum Duradyne Motrin or Motrin IB Triminicin  Aspirin plain, buffered or enteric coated Durasal Myochrisine Trigesic  Aspirin Suppositories Easprin Nalfon Trillsate  Aspirin with Codeine Ecotrin Regular or Extra Strength Naprosyn Uracel  Atromid-S Efficin Naproxen Ursinus  Auranofin Capsules Elmiron Neocylate Vanquish  Axotal Emagrin Norgesic Verin  Azathioprine Empirin or Empirin with Codeine Normiflo Vitamin E  Azolid Emprazil Nuprin Voltaren  Bayer Aspirin plain, buffered or children's or timed BC Tablets or powders Encaprin Orgaran Warfarin Sodium  Buff-a-Comp Enoxaparin Orudis Zorpin  Buff-a-Comp with Codeine Equegesic Os-Cal-Gesic   Buffaprin Excedrin plain, buffered or Extra Strength Oxalid   Bufferin Arthritis Strength Feldene Oxphenbutazone   Bufferin plain or Extra Strength Feldene Capsules  Oxycodone with Aspirin   Bufferin with Codeine Fenoprofen Fenoprofen Pabalate or Pabalate-SF   Buffets II Flogesic Panagesic   Buffinol plain or Extra Strength Florinal or Florinal with Codeine Panwarfarin   Buf-Tabs Flurbiprofen Penicillamine   Butalbital Compound Four-way cold tablets Penicillin   Butazolidin Fragmin Pepto-Bismol   Carbenicillin Geminisyn Percodan   Carna Arthritis Reliever Geopen Persantine   Carprofen Gold's salt Persistin   Chloramphenicol Goody's Phenylbutazone   Chloromycetin Haltrain Piroxlcam   Clmetidine heparin Plaquenil   Cllnoril Hyco-pap Ponstel   Clofibrate Hydroxy chloroquine Propoxyphen         Before stopping any of these medications, be sure to consult the physician who ordered them.  Some, such as Coumadin (Warfarin) are ordered to prevent or treat serious conditions such as "deep thrombosis", "pumonary embolisms", and other heart problems.  The amount of time that you may need off of the medication may also vary with the medication and the reason for which you were taking it.  If you are  taking any of these medications, please make sure you notify your pain physician before you undergo any procedures.         Radiofrequency Lesioning Radiofrequency lesioning is a procedure that is performed to relieve pain. The procedure is often used for back, neck, or arm pain. Radiofrequency lesioning involves the use of a machine that creates radio waves to make heat. During the procedure, the heat is applied to the nerve that carries the pain signal. The heat damages the nerve and interferes with the pain signal. Pain relief usually starts about 2 weeks after the procedure and lasts for 6 months to 1 year. Tell a health care provider about:  Any allergies you have.  All medicines you are taking, including vitamins, herbs, eye drops, creams, and over-the-counter medicines.  Any problems you or family members have had with anesthetic medicines.  Any blood disorders you have.  Any surgeries you have had.  Any medical conditions you have.  Whether you are pregnant or may be pregnant. What are the risks? Generally, this is a safe procedure. However, problems may occur, including:  Pain or soreness at the injection site.  Infection at the injection site.  Damage to nerves or blood vessels. What happens before the procedure?  Ask your health care provider about:  Changing or stopping your regular medicines. This is especially important if you are taking diabetes medicines or blood thinners.  Taking medicines such as aspirin and ibuprofen. These medicines can thin your blood. Do not take these medicines before your procedure if your health care provider instructs you not to.  Follow instructions from your health care provider about eating or drinking restrictions.  Plan to have someone take you home after the procedure.  If you go home right after the procedure, plan to have someone with you for 24 hours. What happens during the procedure?  You will be given one or more  of the following:  A medicine to help you relax (sedative).  A medicine to numb the area (local anesthetic).  You will be awake during the procedure. You will need to be able to talk with the health care provider during the procedure.  With the help of a type of X-ray (fluoroscopy), the health care provider will insert a radiofrequency needle into the area to be treated.  Next, a wire that carries the radio waves (electrode) will be put through the radiofrequency needle. An electrical pulse will be sent through the electrode to verify  the correct nerve. You will feel a tingling sensation, and you may have muscle twitching.  Then, the tissue that is around the needle tip will be heated by an electric current that is passed using the radiofrequency machine. This will numb the nerves.  A bandage (dressing) will be put on the insertion area after the procedure is done. The procedure may vary among health care providers and hospitals. What happens after the procedure?  Your blood pressure, heart rate, breathing rate, and blood oxygen level will be monitored often until the medicines you were given have worn off.  Return to your normal activities as directed by your health care provider. This information is not intended to replace advice given to you by your health care provider. Make sure you discuss any questions you have with your health care provider. Document Released: 04/28/2011 Document Revised: 02/05/2016 Document Reviewed: 10/07/2014 Elsevier Interactive Patient Education  2017 [employer].

## 2017-01-27 NOTE — Progress Notes (Signed)
Nursing Pain Medication Assessment:  Safety precautions to be maintained throughout the outpatient stay will include: orient to surroundings, keep bed in low position, maintain call bell within reach at all times, provide assistance with transfer out of bed and ambulation.  Medication Inspection Compliance: Pill count conducted under aseptic conditions, in front of the patient. Neither the pills nor the bottle was removed from the patient's sight at any time. Once count was completed pills were immediately returned to the patient in their original bottle.  Medication: See above Pill/Patch Count: 29 of 90 pills remain Pill/Patch Appearance: Markings consistent with prescribed medication Bottle Appearance: Standard pharmacy container. Clearly labeled. Filled Date: 04 / 25 / 2018 Last Medication intake:  Today

## 2017-01-31 ENCOUNTER — Ambulatory Visit: Payer: BLUE CROSS/BLUE SHIELD | Admitting: Pain Medicine

## 2017-03-14 NOTE — Progress Notes (Signed)
Patient's Name: Ricky Mercer  MRN: [id number]  Referring Provider: Sofie Hartigan, MD  DOB: Jan 27, 1951  PCP: Sofie Hartigan, MD  DOS: 03/15/2017  Note by: Vevelyn Francois NP  Service setting: Ambulatory outpatient  Specialty: Interventional Pain Management  Location: [facility] (AMB) Pain Management Facility    Patient type: Established    Primary Reason(s) for Visit: Encounter for prescription drug management. (Level of risk: moderate)  CC: Back Pain (mid and low," basically my whole spine")  HPI  [name] is a 66 y.o. year old, male patient, who comes today for a medication management evaluation. He has Hand weakness; Polyradiculopathy; Long term current use of opiate analgesic; Long term prescription opiate use; Opiate use (45 MME/Day); Opiate dependence ([place]); Encounter for therapeutic drug level monitoring; Chronic low back pain  (Location of Primary Pain) (Bilateral) (L>R); Chronic lower extremity pain (Location of Tertiary source of pain) (Bilateral) (L>R); Chronic lumbar radicular pain (Bilateral) (L>R) (Bilateral S1 dermatomal distribution); Chronic hip pain (Location of Secondary source of pain) (Bilateral) (L>R); Chronic upper back pain (Bilateral) (L>R); Chronic neck pain (neck<shoulder) (L>R); Chronic foot pain; Chronic knee pain (Right); Cervical spondylosis; 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); Chronic upper extremity pain (Bilateral) (L>R); Lumbar spondylosis; Cervical facet hypertrophy; Cervical facet syndrome (R>L); History of prostate cancer; Radicular pain of shoulder; Class I Morbid obesity (HCC) (68% higher incidence of chronic low back problems); Peripheral neuropathy (HCC) (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); Sleep apnea; Depression; High cholesterol; Low testosterone; History of surgical procedure; Opioid-induced constipation (OIC); Chronic shoulder pain (Bilateral) (L>R); Breathing orally; Chest pain; Primary cardiomyopathy ([place]);  Chronic sacroiliac joint pain (Bilateral) (L>R); Musculoskeletal pain; Pain of right foot; Neurogenic pain; Chest pain with high risk for cardiac etiology; Chronic pain syndrome; and Bilateral arm pain on his problem list. His primarily concern today is the Back Pain (mid and low," basically my whole spine")  Pain Assessment: Location: Lower, Mid, Upper Back Radiating:   Onset: More than a month ago Duration: Chronic pain Quality: Aching, Constant, Burning, Radiating, Tiring, Sharp, Shooting Severity: 6 /10 (self-reported pain score)  Note: Reported level is compatible with observation.                   Effect on ADL:   Timing: Constant Modifying factors: medications, heat,activity  [name] was last scheduled for an appointment on 12/16/16 for medication management. During today's appointment we reviewed [name] chronic pain status, as well as his outpatient medication regimen. He has chronic middle and low back pain. He has radicular symptoms that goes up into his neck and shoulder and down into his legs. He has any numbness, tingling and weakness. He feels like his pain has gotten worse over the last few months. He admits that he may have lifted a few things that he should not have lifted. He admits that he has failed the Gabapentin and Lyrica.   The patient  reports that he does not use drugs. His body mass index is 27.32 kg/m.  Further details on both, my assessment(s), as well as the proposed treatment plan, please see below.  Controlled Substance Pharmacotherapy Assessment REMS (Risk Evaluation and Mitigation Strategy)  Analgesic:Oxycodone IR 41m every 8 hours (158mday) MME/day:22.45m70may. ShaHart [place]  03/15/2017  1:43 PM  Sign at close encounter Nursing Pain Medication Assessment:  Safety precautions to be maintained throughout the outpatient stay will include: orient to surroundings, keep bed in low position, maintain call bell within reach  at all  times, provide assistance with transfer out of bed and ambulation.  Medication Inspection Compliance: Pill count conducted under aseptic conditions, in front of the patient. Neither the pills nor the bottle was removed from the patient's sight at any time. Once count was completed pills were immediately returned to the patient in their original bottle.  Medication: Oxycodone IR Pill/Patch Count: 54 of 90 pills remain Pill/Patch Appearance: Markings consistent with prescribed medication Bottle Appearance: Standard pharmacy container. Clearly labeled. Filled Date: 06 / 24 / 2018 Last Medication intake:  Today   Pharmacokinetics: Liberation and absorption (onset of action): WNL Distribution (time to peak effect): WNL Metabolism and excretion (duration of action): WNL         Pharmacodynamics: Desired effects: Analgesia: [name] reports >50% benefit. Functional ability: Patient reports that medication allows him to accomplish basic ADLs Clinically meaningful improvement in function (CMIF): Sustained CMIF goals met Perceived effectiveness: Described as relatively effective, allowing for increase in activities of daily living (ADL) Undesirable effects: Side-effects or Adverse reactions: None reported Monitoring: [place] PMP: Online review of the past 56-monthperiod conducted. Compliant with practice rules and regulations List of all UDS test(s) done:  Lab Results  Component Value Date   TOXASSSELUR FINAL 12/23/2015   TOXASSSELUR FINAL 11/17/2015   TOXASSSELUR FINAL 07/23/2015   SUMMARY FINAL 12/16/2016   Last UDS on record: ToxAssure Select 13  Date Value Ref Range Status  12/23/2015 FINAL  Final    Comment:    ==================================================================== TOXASSURE SELECT 13 (MW) ==================================================================== Test                             Result       Flag       Units Drug Present and Declared for Prescription  Verification   Oxycodone                      77           EXPECTED   ng/mg creat   Oxymorphone                    886          EXPECTED   ng/mg creat   Noroxycodone                   404          EXPECTED   ng/mg creat   Noroxymorphone                 329          EXPECTED   ng/mg creat    Sources of oxycodone are scheduled prescription medications.    Oxymorphone, noroxycodone, and noroxymorphone are expected    metabolites of oxycodone. Oxymorphone is also available as a    scheduled prescription medication. ==================================================================== Test                      Result    Flag   Units      Ref Range   Creatinine              160              mg/dL      >=20 ==================================================================== Declared Medications:  The flagging and interpretation on this report are based on the  following declared medications.  Unexpected results may arise from  inaccuracies in the declared medications.  **Note: The testing scope of this panel includes these medications:  Oxycodone  **Note: The testing scope of this panel does not include following  reported medications:  Acetaminophen  Aspirin  Bisacodyl  Citalopram  Diphenhydramine  Docusate  Pantoprazole  Simvastatin  Supplement  Testosterone ==================================================================== For clinical consultation, please call [phone]. ====================================================================    Summary  Date Value Ref Range Status  12/16/2016 FINAL  Final    Comment:    ==================================================================== TOXASSURE SELECT 13 (MW) ==================================================================== Test                             Result       Flag       Units Drug Present and Declared for Prescription Verification   Oxymorphone                    117          EXPECTED   ng/mg creat     Sources of oxymorphone include scheduled prescription    medications; it is also an expected metabolite of oxycodone. Drug Absent but Declared for Prescription Verification   Oxycodone                      Not Detected UNEXPECTED ng/mg creat    Oxycodone is almost always present in patients taking this drug    consistently.  Absence of oxycodone could be due to lapse of time    since the last dose or unusual pharmacokinetics (rapid    metabolism). ==================================================================== Test                      Result    Flag   Units      Ref Range   Creatinine              98               mg/dL      >=20 ==================================================================== Declared Medications:  The flagging and interpretation on this report are based on the  following declared medications.  Unexpected results may arise from  inaccuracies in the declared medications.  **Note: The testing scope of this panel includes these medications:  Oxycodone  **Note: The testing scope of this panel does not include following  reported medications:  Aspirin  Cyclobenzaprine (Flexeril)  Pantoprazole  Simvastatin (Zocor)  Testosterone ==================================================================== For clinical consultation, please call [phone]. ====================================================================    UDS interpretation: Compliant          Medication Assessment Form: Reviewed. Patient indicates being compliant with therapy Treatment compliance: Compliant Risk Assessment Profile: Aberrant behavior: See prior evaluations. None observed or detected today Comorbid factors increasing risk of overdose: See prior notes. No additional risks detected today Risk of substance use disorder (SUD): Low Opioid Risk Tool (ORT) Total Score:    Interpretation Table:  Score <3 = Low Risk for SUD  Score between 4-7 = Moderate Risk for SUD  Score >8 =  High Risk for Opioid Abuse   Risk Mitigation Strategies:  Patient Counseling: Covered Patient-Prescriber Agreement (PPA): Present and active  Notification to other healthcare providers: Done  Pharmacologic Plan: No change in therapy, at this time  Laboratory Chemistry  Inflammation Markers (CRP: Acute Phase) (ESR: Chronic Phase) Lab Results  Component Value Date   CRP <0.5 08/21/2015   ESRSEDRATE 4 08/21/2015  Renal Function Markers Lab Results  Component Value Date   BUN 13 11/25/2015   CREATININE 0.96 11/25/2015   GFRAA >60 11/25/2015   GFRNONAA >60 11/25/2015                 Hepatic Function Markers Lab Results  Component Value Date   AST 31 11/25/2015   ALT 41 11/25/2015   ALBUMIN 3.9 11/25/2015   ALKPHOS 71 11/25/2015                 Electrolytes Lab Results  Component Value Date   NA 138 11/25/2015   K 4.2 11/25/2015   CL 104 11/25/2015   CALCIUM 9.0 11/25/2015   MG 2.1 08/21/2015                 Neuropathy Markers No results found for: [id number]               Bone Pathology Markers Lab Results  Component Value Date   ALKPHOS 71 11/25/2015   CALCIUM 9.0 11/25/2015                 Coagulation Parameters Lab Results  Component Value Date   PLT 238 11/25/2015                 Cardiovascular Markers Lab Results  Component Value Date   HGB 14.6 11/25/2015   HCT 43.5 11/25/2015                 Note: Lab results reviewed.  Recent Diagnostic Imaging Review  Dg C-arm 1-60 Min-no Report  Result Date: 01/03/2017 Fluoroscopy was utilized by the requesting physician.  No radiographic interpretation.   Note: Imaging results reviewed.          Meds   Current Meds  Medication Sig  . ANDROGEL PUMP 20.25 MG/ACT (1.62%) GEL APPLY TO 2 PUMPS ONCE A DAY AS DIRECTED  . aspirin 81 MG tablet Take 81 mg by mouth daily.  . pantoprazole (PROTONIX) 40 MG tablet Take 40 mg by mouth daily.  . sertraline (ZOLOFT) 25 MG tablet Take 25 mg by  mouth daily.  . simvastatin (ZOCOR) 10 MG tablet Take 10 mg by mouth at bedtime.     ROS  Constitutional: Denies any fever or chills Gastrointestinal: No reported hemesis, hematochezia, vomiting, or acute GI distress Musculoskeletal: Denies any acute onset joint swelling, redness, loss of ROM, or weakness Neurological: No reported episodes of acute onset apraxia, aphasia, dysarthria, agnosia, amnesia, paralysis, loss of coordination, or loss of consciousness  Allergies  Mr. Mich has No Known Allergies.  PFSH  Drug: Mr. Petroni  reports that he does not use drugs. Alcohol:  reports that he does not drink alcohol. Tobacco:  reports that he has never smoked. He has never used smokeless tobacco. Medical:  has a past medical history of Bilateral arm pain; Cardiomyopathy ([place]); Diverticulosis; Dupuytren's contracture; Foot pain, bilateral; GERD (gastroesophageal reflux disease); Hand weakness; Hyperlipidemia; Knee pain; Midline low back pain; Osteoarthritis; Pain in neck; Prostate cancer ([place]); Reactive airway disease; Seasonal allergies; Situational anxiety; and Sleep apnea. Surgical: Mr. Portilla  has a past surgical history that includes Nose surgery; Hand surgery (Bilateral); Colonoscopy (10/24/2012); Upper gi endoscopy (12/18/2012); Tonsillectomy (2009); Skin cancer excision (Left, 2015); Mass excision (Right, 07/18/2015); Esophagogastroduodenoscopy (egd) with propofol (N/A, 10/28/2015); prostate seeding; Hernia repair; and Cholecystectomy (N/A, 12/02/2015). Family: family history includes Colon cancer in his brother; Hyperlipidemia in his sister; Lung cancer in his father; Prostate cancer in his brother.  Constitutional Exam  General appearance: Well nourished, well developed, and well hydrated. In no apparent acute distress Vitals:   03/15/17 1336  BP: 129/86  Pulse: 85  Resp: 18  Temp: (!) 97.3 F (36.3 C)  TempSrc: Oral  SpO2: 98%  Weight: 185 lb (83.9 kg)  Height: _0  (1.753 m)    BMI Assessment: Estimated body mass index is 27.32 kg/m as calculated from the following:   Height as of this encounter: _1  (1.753 m).   Weight as of this encounter: 185 lb (83.9 kg).  BMI interpretation table: BMI level Category Range association with higher incidence of chronic pain  <18 kg/m2 Underweight   18.5-24.9 kg/m2 Ideal body weight   25-29.9 kg/m2 Overweight Increased incidence by 20%  30-34.9 kg/m2 Obese (Class I) Increased incidence by 68%  35-39.9 kg/m2 Severe obesity (Class II) Increased incidence by 136%  >40 kg/m2 Extreme obesity (Class III) Increased incidence by 254%   BMI Readings from Last 4 Encounters:  03/15/17 27.32 kg/m  01/27/17 27.32 kg/m  01/03/17 27.32 kg/m  12/16/16 27.32 kg/m   Wt Readings from Last 4 Encounters:  03/15/17 185 lb (83.9 kg)  01/27/17 185 lb (83.9 kg)  01/03/17 185 lb (83.9 kg)  12/16/16 185 lb (83.9 kg)  Psych/Mental status: Alert, oriented x 3 (person, place, & time)       Eyes: PERLA Respiratory: No evidence of acute respiratory distress  Cervical Spine Exam  Inspection: No masses, redness, or swelling Alignment: Symmetrical Functional ROM: Unrestricted ROM      Stability: No instability detected Muscle strength & Tone: Functionally intact Sensory: Unimpaired Palpation: No palpable anomalies              Upper Extremity (UE) Exam    Side: Right upper extremity  Side: Left upper extremity  Inspection: No masses, redness, swelling, or asymmetry. No contractures  Inspection: No masses, redness, swelling, or asymmetry. No contractures  Functional ROM: Unrestricted ROM          Functional ROM: Unrestricted ROM          Muscle strength & Tone: Functionally intact  Muscle strength & Tone: Functionally intact  Sensory: Unimpaired  Sensory: Unimpaired  Palpation: No palpable anomalies              Palpation: No palpable anomalies              Specialized Test(s): Deferred         Specialized Test(s): Deferred           Thoracic Spine Exam  Inspection: No masses, redness, or swelling Alignment: Symmetrical Functional ROM: Unrestricted ROM Stability: No instability detected Sensory: Unimpaired Muscle strength & Tone: Complains of area being tender to palpation  Lumbar Spine Exam  Inspection: No masses, redness, or swelling Alignment: Symmetrical Functional ROM: Unrestricted ROM      Stability: No instability detected Muscle strength & Tone: Functionally intact Sensory: Unimpaired Palpation: Complains of area being tender to palpation       Provocative Tests: Lumbar Hyperextension and rotation test: Positive bilaterally for facet joint pain. Patrick's Maneuver: evaluation deferred today                    Gait & Posture Assessment  Ambulation: Unassisted Gait: Relatively normal for age and body habitus Posture: WNL   Lower Extremity Exam    Side: Right lower extremity  Side: Left lower extremity  Inspection: No masses, redness, swelling, or asymmetry. No contractures  Inspection:  No masses, redness, swelling, or asymmetry. No contractures  Functional ROM: Unrestricted ROM          Functional ROM: Unrestricted ROM          Muscle strength & Tone: Functionally intact  Muscle strength & Tone: Functionally intact  Sensory: Unimpaired  Sensory: Unimpaired  Palpation: No palpable anomalies  Palpation: No palpable anomalies   Assessment  Primary Diagnosis & Pertinent Problem List: The primary encounter diagnosis was Chronic neck pain (neck<shoulder) (L>R). 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  Status Diagnosis  Controlled Controlled Controlled 1. Chronic neck pain (neck<shoulder) (L>R)   2. 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  Cervical facet hypertrophy   4. Musculoskeletal pain   5. Osteoarthritis of spine with radiculopathy, cervical region   6. Chronic pain syndrome   7. Long term current use of opiate analgesic     Problems updated and reviewed during this visit: Problem  Chronic Pain Syndrome  Musculoskeletal Pain  Neurogenic Pain  Chronic sacroiliac joint pain (Bilateral) (L>R)  Chronic shoulder pain (Bilateral) (L>R)  Opioid-induced constipation (OIC)  Chronic low back pain  (Location of Primary Pain) (Bilateral) (L>R)  Chronic lower extremity pain (Location of Tertiary source of pain) (Bilateral) (L>R)  Chronic lumbar radicular pain (Bilateral) (L>R) (Bilateral S1 dermatomal distribution)  Chronic hip pain (Location of Secondary source of pain) (Bilateral) (L>R)  Chronic upper back pain (Bilateral) (L>R)  Chronic neck pain (neck<shoulder) (L>R)  Chronic Foot Pain  Chronic knee pain (Right)   An MRI of the right knee done on 02/18/2014 revealed acute to subacute subchondral insufficiency fracture of the lateral femoral condyle with associated intense marrow edema.   Cervical Spondylosis   An MRI of the cervical spine done on 11/23/2012 revealed mild left neural foraminal narrowing at C2-3 and mild right neural foraminal narrowing at C3-4. A follow-up MRI done in 10/11/2014 revealed moderate to severe upper cervical facet arthrosis and mild disc degeneration resulting in mild left neural foraminal stenosis at C2-C3 and mild right neural foraminal stenosis at C3-4 seems to be unchanged.   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)  Chronic upper extremity pain (Bilateral) (L>R)  Lumbar Spondylosis   An MRI done on 03/30/2014 revealed noncompressive disc bulges at L3-4, L4-L5, and L5-S1, which were interpreted as "Essentially normal study for a person of this age."   Cervical facet hypertrophy  Cervical facet syndrome (R>L)  Radicular Pain of Shoulder  Class I Morbid obesity (HCC) (68%  higher incidence of chronic low back problems)  Peripheral neuropathy ([place]) (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)  Polyradiculopathy  Hand Weakness  Long Term Current Use of Opiate Analgesic  Long Term Prescription Opiate Use  Opiate use (45 MME/Day)  Opiate Dependence (Hcc)  Chest Pain  Primary Cardiomyopathy (Hcc)  Chest Pain With High Risk for Cardiac Etiology  Breathing Orally  History of Surgical Procedure  Encounter for Therapeutic Drug Level Monitoring  History of Prostate Cancer  Sleep Apnea  Depression  High Cholesterol  Low Testosterone  Bilateral Arm Pain  Pain of Right Foot  Lumbar facet syndrome (Bilateral) (L>R) (Resolved)   Plan of Care  Pharmacotherapy (Medications Ordered): Meds ordered this encounter  Medications  . oxyCODONE (OXY IR/ROXICODONE) 5 MG immediate release tablet    Sig:  Take 1 tablet (5 mg total) by mouth every 8 (eight) hours as needed for severe pain.    Dispense:  90 tablet    Refill:  0    Do not add this medication to the electronic "Automatic Refill" notification system. Patient may have prescription filled one day early if pharmacy is closed on scheduled refill date. Do not fill until: 06/02/2017 To last until:07/02/2017    Order Specific Question:   Supervising Provider    Answer:   Milinda Pointer [phone]  . oxyCODONE (OXY IR/ROXICODONE) 5 MG immediate release tablet    Sig: Take 1 tablet (5 mg total) by mouth every 8 (eight) hours as needed for severe pain.    Dispense:  90 tablet    Refill:  0    Do not add this medication to the electronic "Automatic Refill" notification system. Patient may have prescription filled one day early if pharmacy is closed on scheduled refill date. Do not fill until: 04/03/2017 To last until: 05/03/2017    Order Specific Question:   Supervising Provider    Answer:   Milinda Pointer [phone]  . oxyCODONE (OXY IR/ROXICODONE) 5 MG immediate release tablet    Sig: Take 1 tablet (5  mg total) by mouth every 8 (eight) hours as needed for severe pain.    Dispense:  90 tablet    Refill:  0    Do not add this medication to the electronic "Automatic Refill" notification system. Patient may have prescription filled one day early if pharmacy is closed on scheduled refill date. Do not fill until: 05/03/2017 To last until: 06/02/2017    Order Specific Question:   Supervising Provider    Answer:   Milinda Pointer [phone]   New Prescriptions   No medications on file   Medications administered today: Mr. Kistler had no medications administered during this visit. Lab-work, procedure(s), and/or referral(s): Orders Placed This Encounter  Procedures  . ToxASSURE Select 13 (MW), Urine   Imaging and/or referral(s): None  Interventional therapies: Planned, scheduled, and/or pending:   Schedule left SI + Lumbar Facet RFA. The right side will follow two weeks later.   Considering:   Palliative Left cervical epidural steroid injection  Possible bilateral sacroiliac joint + lumbar facet RFA Diagnostic Bilateral Hip Block Diagnostic bilateral femoral nerve and obturator nerve block Possible bilateral femoral nerve and obturator nerve RFA Diagnostic bilateral cervical facet block. Possible bilateral cervical facet RFA. Diagnostic Caudal ESI + diagnostic epidurogram Diagnostic Right Knee injection. Possible series of 5 right-sided intra-articular Hyalgan knee injections Diagnostic right Genicular NB Possible right Genicular Nerve RFA. Diagnostic bilateral intra-articular shoulder injection Diagnostic bilateral suprascapular NB. Possible bilateral suprascapular Nerve RFA.   Palliative PRN treatment(s):   Diagnostic Bilateral Lumbar Facet & Bilateral S-I joint block  Diagnostic Bilateral Hip Block Diagnostic bilateral cervical facet block. Diagnostic Caudal ESI. Diagnostic Left CESI. Diagnostic Right Knee injection. Diagnostic right Genicular NB Diagnostic  bilateral intra-articular shoulder inj. Diagnostic bilateral suprascapular NB.     Provider-requested follow-up: Return in about 3 months (around 06/15/2017) for MedMgmt.  Future Appointments Date Time Provider [place]  03/21/2017 10:15 AM Milinda Pointer, MD [facility] None  06/14/2017 11:00 AM Vevelyn Francois, NP [facility] None   Primary Care Physician: Sofie Hartigan, MD Location: [facility] Note by: Vevelyn Francois NP Date: 03/15/2017; Time: 3:05 PM  Pain Score Disclaimer: We use the NRS-11 scale. This is a self-reported, subjective measurement of pain severity with only modest accuracy. It is  used primarily to identify changes within a particular patient. It must be understood that outpatient pain scales are significantly less accurate that those used for research, where they can be applied under ideal controlled circumstances with minimal exposure to variables. In reality, the score is likely to be a combination of pain intensity and pain affect, where pain affect describes the degree of emotional arousal or changes in action readiness caused by the sensory experience of pain. Factors such as social and work situation, setting, emotional state, anxiety levels, expectation, and prior pain experience may influence pain perception and show large inter-individual differences that may also be affected by time variables.  Patient instructions provided during this appointment: Patient Instructions   ____________________________________________________________________________________________  Medication Rules  Applies to: All patients receiving prescriptions (written or electronic).  Pharmacy of record: Pharmacy where electronic prescriptions will be sent. If written prescriptions are taken to a different pharmacy, please inform the nursing staff. The pharmacy listed in the electronic medical record should be the one where you would like electronic  prescriptions to be sent.  Prescription refills: Only during scheduled appointments. Applies to both, written and electronic prescriptions.  NOTE: The following applies primarily to controlled substances (Opioid* Pain Medications).   Patient's responsibilities: 1. Pain Pills: Bring all pain pills to every appointment (except for procedure appointments). 2. Pill Bottles: Bring pills in original pharmacy bottle. Always bring newest bottle. Bring bottle, even if empty. 3. Medication refills: You are responsible for knowing and keeping track of what medications you need refilled. The day before your appointment, write a list of all prescriptions that need to be refilled. Bring that list to your appointment and give it to the admitting nurse. Prescriptions will be written only during appointments. If you forget a medication, it will not be "Called in", "Faxed", or "electronically sent". You will need to get another appointment to get these prescribed. 4. Prescription Accuracy: You are responsible for carefully inspecting your prescriptions before leaving our office. Have the discharge nurse carefully go over each prescription with you, before taking them home. Make sure that your name is accurately spelled, that your address is correct. Check the name and dose of your medication to make sure it is accurate. Check the number of pills, and the written instructions to make sure they are clear and accurate. Make sure that you are given enough medication to last until your next medication refill appointment. 5. Taking Medication: Take medication as prescribed. Never take more pills than instructed. Never take medication more frequently than prescribed. Taking less pills or less frequently is permitted and encouraged, when it comes to controlled substances (written prescriptions).  6. Inform other Doctors: Always inform, all of your healthcare providers, of all the medications you take. 7. Pain Medication from  other Providers: You are not allowed to accept any additional pain medication from any other Doctor or Healthcare provider. There are two exceptions to this rule. (see below) In the event that you require additional pain medication, you are responsible for notifying us, as stated below. 8. Medication Agreement: You are responsible for carefully reading and following our Medication Agreement. This must be signed before receiving any prescriptions from our practice. Safely store a copy of your signed Agreement. Violations to the Agreement will result in no further prescriptions. (Additional copies of our Medication Agreement are available upon request.) 9. Laws, Rules, & Regulations: All patients are expected to follow all Federal and [employer], [employer], Rules, [employer]. Ignorance of the Laws does not  constitute a valid excuse. The use of any illegal substances is prohibited. 10. Adopted CDC guidelines & recommendations: Target dosing levels will be at or below 60 MME/day. Use of benzodiazepines** is not recommended.  Exceptions: There are only two exceptions to the rule of not receiving pain medications from other Healthcare Providers. 1. Exception #1 (Emergencies): In the event of an emergency (i.e.: accident requiring emergency care), you are allowed to receive additional pain medication. However, you are responsible for: As soon as you are able, call our office [phone], at any time of the day or night, and leave a message stating your name, the date and nature of the emergency, and the name and dose of the medication prescribed. In the event that your call is answered by a member of our staff, make sure to document and save the date, time, and the name of the person that took your information.  2. Exception #2 (Planned Surgery): In the event that you are scheduled by another doctor or dentist to have any type of surgery or procedure, you are allowed (for a period no longer than 30 days), to  receive additional pain medication, for the acute post-op pain. However, in this case, you are responsible for picking up a copy of our "Post-op Pain Management for Surgeons" handout, and giving it to your surgeon or dentist. This document is available at our office, and does not require an appointment to obtain it. Simply go to our office during business hours (Monday-Thursday from 8:00 AM to 4:00 PM) (Friday 8:00 AM to 12:00 Noon) or if you have a scheduled appointment with [place], prior to your surgery, and ask for it by name. In addition, you will need to provide [place] with your name, name of your surgeon, type of surgery, and date of procedure or surgery.  *Opioid medications include: morphine, codeine, oxycodone, oxymorphone, hydrocodone, hydromorphone, meperidine, tramadol, tapentadol, buprenorphine, fentanyl, methadone. **Benzodiazepine medications include: diazepam (Valium), alprazolam (Xanax), clonazepam (Klonopine), lorazepam (Ativan), clorazepate (Tranxene), chlordiazepoxide (Librium), estazolam (Prosom), oxazepam (Serax), temazepam (Restoril), triazolam (Halcion)  ____________________________________________________________________________________________

## 2017-03-15 ENCOUNTER — Ambulatory Visit: Payer: Medicare Other | Attending: Nurse Practitioner | Admitting: Nurse Practitioner

## 2017-03-15 ENCOUNTER — Encounter: Payer: Self-pay | Admitting: Nurse Practitioner

## 2017-03-15 VITALS — BP 129/86 | HR 85 | Temp 97.3°F | Resp 18 | Ht 69.0 in | Wt 185.0 lb

## 2017-03-15 DIAGNOSIS — R079 Chest pain, unspecified: Secondary | ICD-10-CM | POA: Diagnosis not present

## 2017-03-15 DIAGNOSIS — Z79891 Long term (current) use of opiate analgesic: Secondary | ICD-10-CM | POA: Insufficient documentation

## 2017-03-15 DIAGNOSIS — Z9049 Acquired absence of other specified parts of digestive tract: Secondary | ICD-10-CM | POA: Insufficient documentation

## 2017-03-15 DIAGNOSIS — T402X5A Adverse effect of other opioids, initial encounter: Secondary | ICD-10-CM | POA: Insufficient documentation

## 2017-03-15 DIAGNOSIS — G4730 Sleep apnea, unspecified: Secondary | ICD-10-CM | POA: Insufficient documentation

## 2017-03-15 DIAGNOSIS — Z79899 Other long term (current) drug therapy: Secondary | ICD-10-CM | POA: Diagnosis not present

## 2017-03-15 DIAGNOSIS — M79671 Pain in right foot: Secondary | ICD-10-CM | POA: Insufficient documentation

## 2017-03-15 DIAGNOSIS — M542 Cervicalgia: Secondary | ICD-10-CM

## 2017-03-15 DIAGNOSIS — Z85828 Personal history of other malignant neoplasm of skin: Secondary | ICD-10-CM | POA: Insufficient documentation

## 2017-03-15 DIAGNOSIS — M9981 Other biomechanical lesions of cervical region: Secondary | ICD-10-CM | POA: Diagnosis not present

## 2017-03-15 DIAGNOSIS — I429 Cardiomyopathy, unspecified: Secondary | ICD-10-CM | POA: Diagnosis not present

## 2017-03-15 DIAGNOSIS — K5900 Constipation, unspecified: Secondary | ICD-10-CM | POA: Insufficient documentation

## 2017-03-15 DIAGNOSIS — G629 Polyneuropathy, unspecified: Secondary | ICD-10-CM | POA: Insufficient documentation

## 2017-03-15 DIAGNOSIS — Z7982 Long term (current) use of aspirin: Secondary | ICD-10-CM | POA: Diagnosis not present

## 2017-03-15 DIAGNOSIS — Z9889 Other specified postprocedural states: Secondary | ICD-10-CM | POA: Insufficient documentation

## 2017-03-15 DIAGNOSIS — G894 Chronic pain syndrome: Secondary | ICD-10-CM | POA: Diagnosis not present

## 2017-03-15 DIAGNOSIS — M1990 Unspecified osteoarthritis, unspecified site: Secondary | ICD-10-CM | POA: Insufficient documentation

## 2017-03-15 DIAGNOSIS — E7800 Pure hypercholesterolemia, unspecified: Secondary | ICD-10-CM | POA: Diagnosis not present

## 2017-03-15 DIAGNOSIS — M4722 Other spondylosis with radiculopathy, cervical region: Secondary | ICD-10-CM | POA: Insufficient documentation

## 2017-03-15 DIAGNOSIS — M791 Myalgia: Secondary | ICD-10-CM | POA: Insufficient documentation

## 2017-03-15 DIAGNOSIS — G8929 Other chronic pain: Secondary | ICD-10-CM | POA: Diagnosis not present

## 2017-03-15 DIAGNOSIS — M5116 Intervertebral disc disorders with radiculopathy, lumbar region: Secondary | ICD-10-CM | POA: Insufficient documentation

## 2017-03-15 DIAGNOSIS — M47892 Other spondylosis, cervical region: Secondary | ICD-10-CM | POA: Diagnosis not present

## 2017-03-15 DIAGNOSIS — R531 Weakness: Secondary | ICD-10-CM | POA: Diagnosis not present

## 2017-03-15 DIAGNOSIS — M533 Sacrococcygeal disorders, not elsewhere classified: Secondary | ICD-10-CM | POA: Insufficient documentation

## 2017-03-15 DIAGNOSIS — M4802 Spinal stenosis, cervical region: Secondary | ICD-10-CM | POA: Diagnosis not present

## 2017-03-15 DIAGNOSIS — M7918 Myalgia, other site: Secondary | ICD-10-CM

## 2017-03-15 DIAGNOSIS — Z8546 Personal history of malignant neoplasm of prostate: Secondary | ICD-10-CM | POA: Diagnosis not present

## 2017-03-15 DIAGNOSIS — M47812 Spondylosis without myelopathy or radiculopathy, cervical region: Secondary | ICD-10-CM

## 2017-03-15 DIAGNOSIS — M25511 Pain in right shoulder: Secondary | ICD-10-CM | POA: Insufficient documentation

## 2017-03-15 DIAGNOSIS — M25561 Pain in right knee: Secondary | ICD-10-CM | POA: Diagnosis not present

## 2017-03-15 DIAGNOSIS — F329 Major depressive disorder, single episode, unspecified: Secondary | ICD-10-CM | POA: Diagnosis not present

## 2017-03-15 MED ORDER — OXYCODONE HCL 5 MG PO TABS: 5.0000 mg | ORAL_TABLET | Freq: Three times a day (TID) | ORAL | 0 refills | Status: DC | PRN

## 2017-03-15 NOTE — Progress Notes (Signed)
Nursing Pain Medication Assessment:  Safety precautions to be maintained throughout the outpatient stay will include: orient to surroundings, keep bed in low position, maintain call bell within reach at all times, provide assistance with transfer out of bed and ambulation.  Medication Inspection Compliance: Pill count conducted under aseptic conditions, in front of the patient. Neither the pills nor the bottle was removed from the patient's sight at any time. Once count was completed pills were immediately returned to the patient in their original bottle.  Medication: Oxycodone IR Pill/Patch Count: 54 of 90 pills remain Pill/Patch Appearance: Markings consistent with prescribed medication Bottle Appearance: Standard pharmacy container. Clearly labeled. Filled Date: 06 / 24 / 2018 Last Medication intake:  Today

## 2017-03-15 NOTE — Patient Instructions (Signed)

## 2017-03-17 ENCOUNTER — Encounter: Payer: BLUE CROSS/BLUE SHIELD | Admitting: Nurse Practitioner

## 2017-03-21 ENCOUNTER — Ambulatory Visit (HOSPITAL_BASED_OUTPATIENT_CLINIC_OR_DEPARTMENT_OTHER): Payer: Medicare Other | Admitting: Pain Medicine

## 2017-03-21 ENCOUNTER — Encounter: Payer: Self-pay | Admitting: Pain Medicine

## 2017-03-21 ENCOUNTER — Ambulatory Visit
Admission: RE | Admit: 2017-03-21 | Discharge: 2017-03-21 | Disposition: A | Discharge status: Home / Self Care | Payer: Medicare Other | Source: Ambulatory Visit | Attending: Pain Medicine | Admitting: Pain Medicine

## 2017-03-21 VITALS — BP 134/98 | HR 70 | Temp 98.0°F | Resp 18 | Ht 69.0 in | Wt 185.0 lb

## 2017-03-21 DIAGNOSIS — M47816 Spondylosis without myelopathy or radiculopathy, lumbar region: Secondary | ICD-10-CM | POA: Diagnosis not present

## 2017-03-21 DIAGNOSIS — Z85828 Personal history of other malignant neoplasm of skin: Secondary | ICD-10-CM | POA: Diagnosis not present

## 2017-03-21 DIAGNOSIS — M5441 Lumbago with sciatica, right side: Secondary | ICD-10-CM | POA: Insufficient documentation

## 2017-03-21 DIAGNOSIS — M533 Sacrococcygeal disorders, not elsewhere classified: Secondary | ICD-10-CM | POA: Insufficient documentation

## 2017-03-21 DIAGNOSIS — G8918 Other acute postprocedural pain: Secondary | ICD-10-CM | POA: Insufficient documentation

## 2017-03-21 DIAGNOSIS — G8929 Other chronic pain: Secondary | ICD-10-CM

## 2017-03-21 DIAGNOSIS — M5442 Lumbago with sciatica, left side: Secondary | ICD-10-CM | POA: Diagnosis not present

## 2017-03-21 DIAGNOSIS — M4696 Unspecified inflammatory spondylopathy, lumbar region: Secondary | ICD-10-CM | POA: Insufficient documentation

## 2017-03-21 HISTORY — DX: Other acute postprocedural pain: G89.18

## 2017-03-21 LAB — TOXASSURE SELECT 13 (MW), URINE

## 2017-03-21 MED ORDER — TRIAMCINOLONE ACETONIDE 40 MG/ML IJ SUSP: 40.0000 mg | Freq: Once | INTRAMUSCULAR | Status: DC

## 2017-03-21 MED ORDER — ROPIVACAINE HCL 2 MG/ML IJ SOLN
INTRAMUSCULAR | Status: AC
  Filled 2017-03-21: qty 10

## 2017-03-21 MED ORDER — FENTANYL CITRATE (PF) 100 MCG/2ML IJ SOLN
INTRAMUSCULAR | Status: AC
  Filled 2017-03-21: qty 2

## 2017-03-21 MED ORDER — MIDAZOLAM HCL 5 MG/5ML IJ SOLN
INTRAMUSCULAR | Status: AC
  Filled 2017-03-21: qty 5

## 2017-03-21 MED ORDER — ROPIVACAINE HCL 2 MG/ML IJ SOLN
9.0000 mL | Freq: Once | INTRAMUSCULAR | Status: AC
  Administered 2017-03-21: 10 mL via PERINEURAL

## 2017-03-21 MED ORDER — LIDOCAINE HCL (PF) 1.5 % IJ SOLN
20.0000 mL | Freq: Once | INTRAMUSCULAR | Status: DC
  Filled 2017-03-21: qty 20

## 2017-03-21 MED ORDER — TRIAMCINOLONE ACETONIDE 40 MG/ML IJ SUSP
INTRAMUSCULAR | Status: AC
  Filled 2017-03-21: qty 1

## 2017-03-21 MED ORDER — LACTATED RINGERS IV SOLN
1000.0000 mL | Freq: Once | INTRAVENOUS | Status: AC
  Administered 2017-03-21: 1000 mL via INTRAVENOUS

## 2017-03-21 MED ORDER — HYDROCODONE-ACETAMINOPHEN 5-325 MG PO TABS: 1.0000 | ORAL_TABLET | Freq: Three times a day (TID) | ORAL | 0 refills | Status: DC | PRN

## 2017-03-21 MED ORDER — MIDAZOLAM HCL 5 MG/5ML IJ SOLN
1.0000 mg | INTRAMUSCULAR | Status: DC | PRN
  Administered 2017-03-21: 2 mg via INTRAVENOUS

## 2017-03-21 MED ORDER — FENTANYL CITRATE (PF) 100 MCG/2ML IJ SOLN
25.0000 ug | INTRAMUSCULAR | Status: DC | PRN
Start: 2017-03-21 — End: 2017-03-21
  Administered 2017-03-21: 100 ug via INTRAVENOUS

## 2017-03-21 NOTE — Patient Instructions (Signed)
____________________________________________________________________________________________  Post-Procedure instructions Instructions:  Apply ice: Fill a plastic sandwich bag with crushed ice. Cover it with a small towel and apply to injection site. Apply for 15 minutes then remove x 15 minutes. Repeat sequence on day of procedure, until you go to bed. The purpose is to minimize swelling and discomfort after procedure.  Apply heat: Apply heat to procedure site starting the day following the procedure. The purpose is to treat any soreness and discomfort from the procedure.  Food intake: Start with clear liquids (like water) and advance to regular food, as tolerated.   Physical activities: Keep activities to a minimum for the first 8 hours after the procedure.   Driving: If you have received any sedation, you are not allowed to drive for 24 hours after your procedure.  Blood thinner: Restart your blood thinner 6 hours after your procedure. (Only for those taking blood thinners)  Insulin: As soon as you can eat, you may resume your normal dosing schedule. (Only for those taking insulin)  Infection prevention: Keep procedure site clean and dry.  Post-procedure Pain Diary: Extremely important that this be done correctly and accurately. Recorded information will be used to determine the next step in treatment.  Pain evaluated is that of treated area only. Do not include pain from an untreated area.  Complete every hour, on the hour, for the initial 8 hours. Set an alarm to help you do this part accurately.  Do not go to sleep and have it completed later. It will not be accurate.  Follow-up appointment: Keep your follow-up appointment after the procedure. Usually 2 weeks for most procedures. (6 weeks in the case of radiofrequency.) Bring you pain diary.  Expect:  From numbing medicine (AKA: Local Anesthetics): Numbness or decrease in pain.  Onset: Full effect within 15 minutes of  injected.  Duration: It will depend on the type of local anesthetic used. On the average, 1 to 8 hours.   From steroids: Decrease in swelling or inflammation. Once inflammation is improved, relief of the pain will follow.  Onset of benefits: Depends on the amount of swelling present. The more swelling, the longer it will take for the benefits to be seen. In some cases, up to 10 days.  Duration: Steroids will stay in the system x 2 weeks. Duration of benefits will depend on multiple posibilities including persistent irritating factors.  From procedure: Some discomfort is to be expected once the numbing medicine wears off. This should be minimal if ice and heat are applied as instructed. Call if:  You experience numbness and weakness that gets worse with time, as opposed to wearing off.  New onset bowel or bladder incontinence. (Spinal procedures only)  Emergency Numbers:  Durning business hours (Monday - Thursday, 8:00 AM - 4:00 PM) (Friday, 9:00 AM - 12:00 Noon): [phone]  After hours: [phone] ____________________________________________________________________________________________  ____________________________________________________________________________________________  Preparing for Procedure with Sedation Instructions: . Oral Intake: Do not eat or drink anything for at least 8 hours prior to your procedure. . Transportation: Public transportation is not allowed. Bring an adult driver. The driver must be physically present in our waiting room before any procedure can be started. . Physical Assistance: Bring an adult physically capable of assisting you, in the event you need help. This adult should keep you company at home for at least 6 hours after the procedure. . Blood Pressure Medicine: Take your blood pressure medicine with a sip of water the morning of the procedure. . Blood   thinners:  . Diabetics on insulin: Notify the staff so that you can be scheduled  1st case in the morning. If your diabetes requires high dose insulin, take only  of your normal insulin dose the morning of the procedure and notify the staff that you have done so. . Preventing infections: Shower with an antibacterial soap the morning of your procedure. . Build-up your immune system: Take 1000 mg of Vitamin C with every meal (3 times a day) the day prior to your procedure. . Antibiotics: Inform the staff if you have a condition or reason that requires you to take antibiotics before dental procedures. . Pregnancy: If you are pregnant, call and cancel the procedure. . Sickness: If you have a cold, fever, or any active infections, call and cancel the procedure. . Arrival: You must be in the facility at least 30 minutes prior to your scheduled procedure. . Children: Do not bring children with you. . Dress appropriately: Bring dark clothing that you would not mind if they get stained. . Valuables: Do not bring any jewelry or valuables. Procedure appointments are reserved for interventional treatments only. . No Prescription Refills. . No medication changes will be discussed during procedure appointments. . No disability issues will be discussed. ____________________________________________________________________________________________   

## 2017-03-21 NOTE — Progress Notes (Signed)
Patient's Name: Ricky Mercer  MRN: [id number]  Referring Provider: Milinda Pointer, MD  DOB: 1951/02/18  PCP: Sofie Hartigan, MD  DOS: 03/21/2017  Note by: Kathlen Brunswick. Dossie Arbour, MD  Service setting: Ambulatory outpatient  Specialty: Interventional Pain Management  Patient type: Established  Location: [facility] (AMB) Pain Management Facility  Visit type: Interventional Procedure   Primary Reason for Visit: Interventional Pain Management Treatment. CC: Back Pain (lower and mid)  Procedure:  Anesthesia, Analgesia, Anxiolysis:  Type: Therapeutic Medial Branch Facet & Sacroiliac joint Radiofrequency Ablation Region: Lumbosacral Level: L2, L3, L4, L5, S1, S2, & S3 Medial Branch Level(s) Laterality: Left-Sided  Type: Local Anesthesia with Moderate (Conscious) Sedation Local Anesthetic: Lidocaine 1% Route: Intravenous (IV) IV Access: Secured Sedation: Meaningful verbal contact was maintained at all times during the procedure  Indication(s): Analgesia and Anxiety  Indications: 1. Lumbar facet syndrome (Bilateral) (L>R)   2. Chronic sacroiliac joint pain (Bilateral) (L>R)   3. Chronic low back pain  (Location of Primary Pain) (Bilateral) (L>R)   4. Lumbar spondylosis   5. Acute postoperative pain    Ricky Mercer has either failed to respond, was unable to tolerate, or simply did not get enough benefit from other more conservative therapies including, but not limited to: 1. Over-the-counter medications 2. Anti-inflammatory medications 3. Muscle relaxants 4. Membrane stabilizers 5. Opioids 6. Physical therapy 7. Modalities (Heat, ice, etc.) 8. Invasive techniques such as nerve blocks. Ricky Mercer has attained more than 50% relief of the pain from a series of diagnostic injections conducted in separate occasions.  Pain Score: Pre-procedure: 6 /10 Post-procedure: 0-No pain/10  Pre-op Assessment:  Previous date of service: 01/27/17 Service provided: Evaluation (post evaluation) [name] is a 66 y.o. (year old), male patient, seen today for interventional treatment. He  has a past surgical history that includes Nose surgery; Hand surgery (Bilateral); Colonoscopy (10/24/2012); Upper gi endoscopy (12/18/2012); Tonsillectomy (2009); Skin cancer excision (Left, 2015); Mass excision (Right, 07/18/2015); Esophagogastroduodenoscopy (egd) with propofol (N/A, 10/28/2015); prostate seeding; Hernia repair; and Cholecystectomy (N/A, 12/02/2015). Ricky Mercer has a current medication list which includes the following prescription(s): androgel pump, aspirin, oxycodone, oxycodone, oxycodone, pantoprazole, sertraline, simvastatin, and hydrocodone-acetaminophen, and the following Facility-Administered Medications: fentanyl, lidocaine, midazolam, triamcinolone acetonide, and triamcinolone acetonide. His primarily concern today is the Back Pain (lower and mid)  Initial Vital Signs: Blood pressure (!) 146/88, pulse 70, temperature 98 F (36.7 C), resp. rate 16, height 5\' 9"  (1.753 m), weight 185 lb (83.9 kg), SpO2 99 %. BMI: 27.32 kg/m  Risk Assessment: Allergies: Reviewed. He has No Known Allergies.  Allergy Precautions: None required Coagulopathies: Reviewed. None identified.  Blood-thinner therapy: None at this time Active Infection(s): Reviewed. None identified. Ricky Mercer is afebrile  Site Confirmation: Ricky Mercer was asked to confirm the procedure and laterality before marking the site Procedure checklist: Completed Consent: Before the procedure and under the influence of no sedative(s), amnesic(s), or anxiolytics, the patient was informed of the treatment options, risks and possible complications. To fulfill our ethical and legal obligations, as recommended by the American Medical Association's Code of Ethics, I have informed the patient of my clinical impression; the nature and purpose of the treatment or procedure; the risks, benefits, and possible complications of the intervention; the  alternatives, including doing nothing; the risk(s) and benefit(s) of the alternative treatment(s) or procedure(s); and the risk(s) and benefit(s) of doing nothing. The patient was provided information about the general risks and possible complications associated with the procedure. These may include, but  are not limited to: failure to achieve desired goals, infection, bleeding, organ or nerve damage, allergic reactions, paralysis, and death. In addition, the patient was informed of those risks and complications associated to Spine-related procedures, such as failure to decrease pain; infection (i.e.: Meningitis, epidural or intraspinal abscess); bleeding (i.e.: epidural hematoma, subarachnoid hemorrhage, or any other type of intraspinal or peri-dural bleeding); organ or nerve damage (i.e.: Any type of peripheral nerve, nerve root, or spinal cord injury) with subsequent damage to sensory, motor, and/or autonomic systems, resulting in permanent pain, numbness, and/or weakness of one or several areas of the body; allergic reactions; (i.e.: anaphylactic reaction); and/or death. Furthermore, the patient was informed of those risks and complications associated with the medications. These include, but are not limited to: allergic reactions (i.e.: anaphylactic or anaphylactoid reaction(s)); adrenal axis suppression; blood sugar elevation that in diabetics may result in ketoacidosis or comma; water retention that in patients with history of congestive heart failure may result in shortness of breath, pulmonary edema, and decompensation with resultant heart failure; weight gain; swelling or edema; medication-induced neural toxicity; particulate matter embolism and blood vessel occlusion with resultant organ, and/or nervous system infarction; and/or aseptic necrosis of one or more joints. Finally, the patient was informed that Medicine is not an exact science; therefore, there is also the possibility of unforeseen or  unpredictable risks and/or possible complications that may result in a catastrophic outcome. The patient indicated having understood very clearly. We have given the patient no guarantees and we have made no promises. Enough time was given to the patient to ask questions, all of which were answered to the patient's satisfaction. Ricky Mercer has indicated that he wanted to continue with the procedure. Attestation: I, the ordering provider, attest that I have discussed with the patient the benefits, risks, side-effects, alternatives, likelihood of achieving goals, and potential problems during recovery for the procedure that I have provided informed consent. Date: 03/21/2017; Time: 11:33 AM  Pre-Procedure Preparation:  Monitoring: As per clinic protocol. Respiration, ETCO2, SpO2, BP, heart rate and rhythm monitor placed and checked for adequate function Safety Precautions: Patient was assessed for positional comfort and pressure points before starting the procedure. Time-out: I initiated and conducted the "Time-out" before starting the procedure, as per protocol. The patient was asked to participate by confirming the accuracy of the "Time Out" information. Verification of the correct person, site, and procedure were performed and confirmed by me, the nursing staff, and the patient. "Time-out" conducted as per Joint Commission's Universal Protocol (UP.01.01.01). "Time-out" Date & Time: 03/21/2017; 1151 hrs.  Description of Procedure Process:   Target Area: For Lumbar Facet blocks, the target is the groove formed by the junction of the transverse process and superior articular process. For the L5 dorsal ramus, the target is the notch between superior articular process and sacral ala. For the Sacral dorsal rami, the target is the superior and lateral edge of the posterior S1, S2, & S3 Sacral foramen. Approach: Paraspinal approach. Area Prepped: Entire Posterior Lumbosacral Region Prepping solution: Hibiclens  (4.0% Chlorhexidine gluconate solution) Safety Precautions: Aspiration looking for blood return was conducted prior to all injections. At no point did we inject any substances, as a needle was being advanced. No attempts were made at seeking any paresthesias. Safe injection practices and needle disposal techniques used. Medications properly checked for expiration dates. SDV (single dose vial) medications used. Description of the Procedure: Protocol guidelines were followed. The patient was placed in position over the fluoroscopy table. The target area  was identified and the area prepped in the usual manner. Skin desensitized using vapocoolant spray. Skin & deeper tissues infiltrated with local anesthetic. Appropriate amount of time allowed to pass for local anesthetics to take effect. Radiofrequency needles were introduced to the area of the medial branch at the junction of the superior articular process and transverse process using fluoroscopy. Using the [employer], sensory stimulation using 50 Hz was used to locate & identify the nerve, making sure that the needle was positioned such that there was no sensory stimulation below 0.3 V or above 0.7 V. Stimulation using 2 Hz was used to evaluate the motor component. Care was taken not to lesion any nerves that demonstrated motor stimulation of the lower extremities at an output of less than 2.5 times that of the sensory threshold, or a maximum of 2.0 V. Once satisfactory placement of the needles was achieved, the above solution was slowly injected after negative aspiration. After waiting for at least 2 minutes, the ablation was performed at 80 degrees C for 60 seconds.The needles were then removed and the area cleansed, making sure to leave some of the prepping solution back to take advantage of its long term bactericidal properties. Intra-operative Compliance: Compliant Vitals:   03/21/17 1225 03/21/17 1235 03/21/17 1245 03/21/17 1255  BP:  118/83 (!) 128/98 (!) 126/96 (!) 134/98  Pulse: 68 68 70 70  Resp: 10 17 16 18   Temp:      SpO2: 98% 98% 99% 99%  Weight:      Height:        Start Time: 1152 hrs. End Time: 1228 hrs. Materials & Medications:  Needle(s) Type: Teflon-coated, curved tip, Radiofrequency needle(s) Gauge: 22G Length: 10cm Medication(s): We administered lactated ringers, midazolam, fentaNYL, ropivacaine (PF) 2 mg/mL (0.2%), and ropivacaine (PF) 2 mg/mL (0.2%). Please see chart orders for dosing details.  Imaging Guidance (Spinal):  Type of Imaging Technique: Fluoroscopy Guidance (Spinal) Indication(s): Assistance in needle guidance and placement for procedures requiring needle placement in or near specific anatomical locations not easily accessible without such assistance. Exposure Time: Please see nurses notes. Contrast: None used. Fluoroscopic Guidance: I was personally present during the use of fluoroscopy. "Tunnel Vision Technique" used to obtain the best possible view of the target area. Parallax error corrected before commencing the procedure. "Direction-depth-direction" technique used to introduce the needle under continuous pulsed fluoroscopy. Once target was reached, antero-posterior, oblique, and lateral fluoroscopic projection used confirm needle placement in all planes. Images permanently stored in EMR. Interpretation: No contrast injected. I personally interpreted the imaging intraoperatively. Adequate needle placement confirmed in multiple planes. Permanent images saved into the patient's record.  Antibiotic Prophylaxis:  Indication(s): None identified Antibiotic given: None  Post-operative Assessment:  EBL: None Complications: No immediate post-treatment complications observed by team, or reported by patient. Note: The patient tolerated the entire procedure well. A repeat set of vitals were taken after the procedure and the patient was kept under observation following institutional policy, for  this type of procedure. Post-procedural neurological assessment was performed, showing return to baseline, prior to discharge. The patient was provided with post-procedure discharge instructions, including a section on how to identify potential problems. Should any problems arise concerning this procedure, the patient was given instructions to immediately contact us, at any time, without hesitation. In any case, we plan to contact the patient by telephone for a follow-up status report regarding this interventional procedure. Comments:  No additional relevant information.  Plan of Care  Disposition: Discharge home  Discharge  Date & Time: 03/21/2017; 1257 hrs.  Physician-requested Follow-up:  Return for procedure (w/ sedation), RFA, contralateral RF (in 2-wks), by MD.  Future Appointments Date Time Provider [place]  05/03/2017 10:00 AM Milinda Pointer, MD [facility] None  06/14/2017 11:00 AM Vevelyn Francois, NP [facility] None  06/15/2017 10:15 AM Milinda Pointer, MD [facility] None   Medications ordered for procedure: Meds ordered this encounter  Medications  . lactated ringers infusion 1,000 mL  . midazolam (VERSED) 5 MG/5ML injection 1-2 mg    Make sure Flumazenil is available in the pyxis when using this medication. If oversedation occurs, administer 0.2 mg IV over 15 sec. If after 45 sec no response, administer 0.2 mg again over 1 min; may repeat at 1 min intervals; not to exceed 4 doses (1 mg)  . fentaNYL (SUBLIMAZE) injection 25-50 mcg    Make sure Narcan is available in the pyxis when using this medication. In the event of respiratory depression (RR< 8/min): Titrate NARCAN (naloxone) in increments of 0.1 to 0.2 mg IV at 2-3 minute intervals, until desired degree of reversal.  . lidocaine 1.5 % injection 20 mL    From block tray  . triamcinolone acetonide (KENALOG-40) injection 40 mg  . triamcinolone acetonide (KENALOG-40) injection 40 mg  . ropivacaine (PF) 2 mg/mL (0.2%)  (NAROPIN) injection 9 mL  . ropivacaine (PF) 2 mg/mL (0.2%) (NAROPIN) injection 9 mL  . HYDROcodone-acetaminophen (NORCO/VICODIN) 5-325 MG tablet    Sig: Take 1 tablet by mouth every 8 (eight) hours as needed for severe pain.    Dispense:  30 tablet    Refill:  0    For acute post-operative pain. Not to be refilled. To last until: 03/31/17   Medications administered: We administered lactated ringers, midazolam, fentaNYL, ropivacaine (PF) 2 mg/mL (0.2%), and ropivacaine (PF) 2 mg/mL (0.2%).  See the medical record for exact dosing, route, and time of administration.  Lab-work, Procedure(s), & Referral(s) Ordered: Orders Placed This Encounter  Procedures  . Radiofrequency Sacroiliac Joint  . Radiofrequency,Lumbar  . Radiofrequency Sacroiliac Joint  . DG C-Arm 1-60 Min-No Report  . Informed Consent Details: Transcribe to consent form and obtain patient signature  . Provider attestation of informed consent for procedure/surgical case  . Verify informed consent  . Discharge instructions  . Follow-up   Imaging Ordered: Results for orders placed in visit on 01/03/17  DG C-Arm 1-60 Min-No Report   Narrative Fluoroscopy was utilized by the requesting physician.  No radiographic  interpretation.    New Prescriptions   HYDROCODONE-ACETAMINOPHEN (NORCO/VICODIN) 5-325 MG TABLET    Take 1 tablet by mouth every 8 (eight) hours as needed for severe pain.   Primary Care Physician: Sofie Hartigan, MD Location: [facility] Note by: Kathlen Brunswick. Dossie Arbour, M.D, DABA, DABAPM, DABPM, DABIPP, FIPP Date: 03/21/2017; Time: 2:11 PM  Disclaimer:  Medicine is not an Chief Strategy Officer. The only guarantee in medicine is that nothing is guaranteed. It is important to note that the decision to proceed with this intervention was based on the information collected from the patient. The Data and conclusions were drawn from the patient's questionnaire, the interview, and the physical  examination. Because the information was provided in large part by the patient, it cannot be guaranteed that it has not been purposely or unconsciously manipulated. Every effort has been made to obtain as much relevant data as possible for this evaluation. It is important to note that the conclusions that lead to this procedure are derived  in large part from the available data. Always take into account that the treatment will also be dependent on availability of resources and existing treatment guidelines, considered by other Pain Management Practitioners as being common knowledge and practice, at the time of the intervention. For Medico-Legal purposes, it is also important to point out that variation in procedural techniques and pharmacological choices are the acceptable norm. The indications, contraindications, technique, and results of the above procedure should only be interpreted and judged by a Board-Certified Interventional Pain Specialist with extensive familiarity and expertise in the same exact procedure and technique.  Instructions provided at this appointment: Patient Instructions  ____________________________________________________________________________________________  Post-Procedure instructions Instructions:  Apply ice: Fill a plastic sandwich bag with crushed ice. Cover it with a small towel and apply to injection site. Apply for 15 minutes then remove x 15 minutes. Repeat sequence on day of procedure, until you go to bed. The purpose is to minimize swelling and discomfort after procedure.  Apply heat: Apply heat to procedure site starting the day following the procedure. The purpose is to treat any soreness and discomfort from the procedure.  Food intake: Start with clear liquids (like water) and advance to regular food, as tolerated.   Physical activities: Keep activities to a minimum for the first 8 hours after the procedure.   Driving: If you have received any sedation, you  are not allowed to drive for 24 hours after your procedure.  Blood thinner: Restart your blood thinner 6 hours after your procedure. (Only for those taking blood thinners)  Insulin: As soon as you can eat, you may resume your normal dosing schedule. (Only for those taking insulin)  Infection prevention: Keep procedure site clean and dry.  Post-procedure Pain Diary: Extremely important that this be done correctly and accurately. Recorded information will be used to determine the next step in treatment.  Pain evaluated is that of treated area only. Do not include pain from an untreated area.  Complete every hour, on the hour, for the initial 8 hours. Set an alarm to help you do this part accurately.  Do not go to sleep and have it completed later. It will not be accurate.  Follow-up appointment: Keep your follow-up appointment after the procedure. Usually 2 weeks for most procedures. (6 weeks in the case of radiofrequency.) Bring you pain diary.  Expect:  From numbing medicine (AKA: Local Anesthetics): Numbness or decrease in pain.  Onset: Full effect within 15 minutes of injected.  Duration: It will depend on the type of local anesthetic used. On the average, 1 to 8 hours.   From steroids: Decrease in swelling or inflammation. Once inflammation is improved, relief of the pain will follow.  Onset of benefits: Depends on the amount of swelling present. The more swelling, the longer it will take for the benefits to be seen. In some cases, up to 10 days.  Duration: Steroids will stay in the system x 2 weeks. Duration of benefits will depend on multiple posibilities including persistent irritating factors.  From procedure: Some discomfort is to be expected once the numbing medicine wears off. This should be minimal if ice and heat are applied as instructed. Call if:  You experience numbness and weakness that gets worse with time, as opposed to wearing off.  New onset bowel or bladder  incontinence. (Spinal procedures only)  Emergency Numbers:  [facility] business hours (Monday - Thursday, 8:00 AM - 4:00 PM) (Friday, 9:00 AM - 12:00 Noon): [phone]  After hours: [phone] ____________________________________________________________________________________________  ____________________________________________________________________________________________  Preparing for Procedure with Sedation Instructions: . Oral Intake: Do not eat or drink anything for at least 8 hours prior to your procedure. . Transportation: Public transportation is not allowed. Bring an adult driver. The driver must be physically present in our waiting room before any procedure can be started. [place] Physical Assistance: Bring an adult physically capable of assisting you, in the event you need help. This adult should keep you company at home for at least 6 hours after the procedure. . Blood Pressure Medicine: Take your blood pressure medicine with a sip of water the morning of the procedure. . Blood thinners:  . Diabetics on insulin: Notify the staff so that you can be scheduled 1st case in the morning. If your diabetes requires high dose insulin, take only  of your normal insulin dose the morning of the procedure and notify the staff that you have done so. . Preventing infections: Shower with an antibacterial soap the morning of your procedure. . Build-up your immune system: Take 1000 mg of Vitamin C with every meal (3 times a day) the day prior to your procedure. Marland Kitchen Antibiotics: Inform the staff if you have a condition or reason that requires you to take antibiotics before dental procedures. . Pregnancy: If you are pregnant, call and cancel the procedure. . Sickness: If you have a cold, fever, or any active infections, call and cancel the procedure. . Arrival: You must be in the facility at least 30 minutes prior to your scheduled procedure. . Children: Do not bring children with you. . Dress  appropriately: Bring dark clothing that you would not mind if they get stained. . Valuables: Do not bring any jewelry or valuables. Procedure appointments are reserved for interventional treatments only. [place] Kitchen No Prescription Refills. . No medication changes will be discussed during procedure appointments. . No disability issues will be discussed. ____________________________________________________________________________________________

## 2017-03-22 ENCOUNTER — Telehealth: Payer: Self-pay

## 2017-03-22 NOTE — Telephone Encounter (Signed)
Post procedure phone call.  Left message.  

## 2017-05-02 ENCOUNTER — Ambulatory Visit: Payer: Medicare Other | Admitting: Pain Medicine

## 2017-05-03 ENCOUNTER — Ambulatory Visit: Payer: Medicare Other | Admitting: Pain Medicine

## 2017-05-09 ENCOUNTER — Ambulatory Visit: Payer: Medicare Other | Attending: Pain Medicine | Admitting: Pain Medicine

## 2017-05-09 ENCOUNTER — Encounter: Payer: Self-pay | Admitting: Pain Medicine

## 2017-05-09 VITALS — BP 121/84 | HR 86 | Temp 98.3°F | Resp 18 | Ht 69.0 in | Wt 185.0 lb

## 2017-05-09 DIAGNOSIS — E559 Vitamin D deficiency, unspecified: Secondary | ICD-10-CM

## 2017-05-09 DIAGNOSIS — M5441 Lumbago with sciatica, right side: Secondary | ICD-10-CM

## 2017-05-09 DIAGNOSIS — M792 Neuralgia and neuritis, unspecified: Secondary | ICD-10-CM | POA: Diagnosis not present

## 2017-05-09 DIAGNOSIS — M4696 Unspecified inflammatory spondylopathy, lumbar region: Secondary | ICD-10-CM | POA: Diagnosis not present

## 2017-05-09 DIAGNOSIS — M47816 Spondylosis without myelopathy or radiculopathy, lumbar region: Secondary | ICD-10-CM

## 2017-05-09 DIAGNOSIS — M5442 Lumbago with sciatica, left side: Secondary | ICD-10-CM | POA: Diagnosis not present

## 2017-05-09 DIAGNOSIS — G8929 Other chronic pain: Secondary | ICD-10-CM

## 2017-05-09 DIAGNOSIS — M533 Sacrococcygeal disorders, not elsewhere classified: Secondary | ICD-10-CM

## 2017-05-09 MED ORDER — GABAPENTIN 100 MG PO CAPS: 100.0000 mg | ORAL_CAPSULE | Freq: Four times a day (QID) | ORAL | 0 refills | Status: DC

## 2017-05-09 MED ORDER — CHOLECALCIFEROL 125 MCG (5000 UT) PO CAPS: 5000.0000 [IU] | ORAL_CAPSULE | Freq: Every day | ORAL | 0 refills | Status: DC

## 2017-05-09 NOTE — Progress Notes (Signed)
Patient's Name: Ricky Mercer  MRN: [id number]  Referring Provider: Sofie Hartigan, MD  DOB: 10-Apr-1951  PCP: Sofie Hartigan, MD  DOS: 05/09/2017  Note by: Gaspar Cola, MD  Service setting: Ambulatory outpatient  Specialty: Interventional Pain Management  Location: [facility] (AMB) Pain Management Facility    Patient type: Established   Primary Reason(s) for Visit: Encounter for post-procedure evaluation of chronic illness with mild to moderate exacerbation CC: Back Pain (low); Hip Pain (bilateral); and Leg Pain (bilateral)  HPI  Ricky Mercer is a 66 y.o. year old, male patient, who comes today for a post-procedure evaluation. He has Hand weakness; Polyradiculopathy; Long term current use of opiate analgesic; Long term prescription opiate use; Opiate use (45 MME/Day); Opiate dependence ([place]); Encounter for therapeutic drug level monitoring; Chronic low back pain  (Location of Primary Pain) (Bilateral) (L>R); Chronic lower extremity pain (Location of Tertiary source of pain) (Bilateral) (L>R); Chronic lumbar radicular pain (Bilateral) (L>R) (Bilateral S1 dermatomal distribution); Chronic hip pain (Location of Secondary source of pain) (Bilateral) (L>R); Chronic upper back pain (Bilateral) (L>R); Chronic neck pain (neck<shoulder) (L>R); Chronic foot pain; Chronic knee pain (Right); Cervical spondylosis; 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); Chronic upper extremity pain (Bilateral) (L>R); Lumbar spondylosis; Cervical facet hypertrophy; Cervical facet syndrome (R>L); History of prostate cancer; Radicular pain of shoulder; Class I Morbid obesity (HCC) (68% higher incidence of chronic low back problems); Peripheral neuropathy (HCC) (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); Lumbar facet syndrome (Bilateral) (L>R); Sleep apnea; Depression; High cholesterol; Low testosterone; History of surgical procedure; Opioid-induced constipation ([facility]IC); Chronic shoulder pain (Bilateral)  (L>R); Breathing orally; Chest pain; Primary cardiomyopathy ([place]); Chronic sacroiliac joint pain (Bilateral) (L>R); Musculoskeletal pain; Pain of right foot; Neurogenic pain; Chest pain with high risk for cardiac etiology; Chronic pain syndrome; Bilateral arm pain; Acute postoperative pain; and Vitamin D insufficiency on his problem list. His primarily concern today is the Back Pain (low); Hip Pain (bilateral); and Leg Pain (bilateral)  Pain Assessment: Location: Right, Left, Lower Back Radiating: legs and hips, left hip has gotten worse Onset: More than a month ago Duration: Chronic pain Quality: Constant, Pins and needles, Radiating Severity: 5 /10 (self-reported pain score)  Note: Reported level is compatible with observation.                   Effect on ADL:   Timing: Constant Modifying factors: medications  Ricky Mercer comes in today for post-procedure evaluation after the treatment done on 03/21/2017.  Further details on both, my assessment(s), as well as the proposed treatment plan, please see below.  Post-Procedure Assessment  03/21/2017 Procedure: Schedule left SI + Lumbar Facet RFA. Pre-procedure pain score:  6/10 Post-procedure pain score: 0/10 (100% relief) Influential Factors: BMI: 27.32 kg/m Intra-procedural challenges: None observed.         Assessment challenges: None detected.              Reported side-effects: Procedure-associated transient increase in pain. Post-procedural adverse reactions or complications: Transient increase in pain.         Sedation: Sedation provided. When no sedatives are used, the analgesic levels obtained are directly associated to the effectiveness of the local anesthetics. However, when sedation is provided, the level of analgesia obtained during the initial 1 hour following the intervention, is believed to be the result of a combination of factors. These factors may include, but are not limited to: 1. The effectiveness of the local  anesthetics used. 2. The  effects of the analgesic(s) and/or anxiolytic(s) used. 3. The degree of discomfort experienced by the patient at the time of the procedure. 4. The patients ability and reliability in recalling and recording the events. 5. The presence and influence of possible secondary gains and/or psychosocial factors. Reported result: Relief experienced during the 1st hour after the procedure: 100 % (Ultra-Short Term Relief) Ricky Mercer has indicated area to have been numb during this time. Interpretative annotation: Clinically appropriate result. Analgesia during this period is likely to be Local Anesthetic and/or IV Sedative (Analgesic/Anxiolytic) related.          Effects of local anesthetic: The analgesic effects attained during this period are directly associated to the localized infiltration of local anesthetics and therefore cary significant diagnostic value as to the etiological location, or anatomical origin, of the pain. Expected duration of relief is directly dependent on the pharmacodynamics of the local anesthetic used. Long-acting (4-6 hours) anesthetics used.  Reported result: Relief during the next 4 to 6 hour after the procedure: 100 % (Short-Term Relief) Ricky Mercer has indicated area to have been numb during this time. Interpretative annotation: Clinically appropriate result. Analgesia during this period is likely to be Local Anesthetic-related.          Long-term benefit: Defined as the period of time past the expected duration of local anesthetics (1 hour for short-acting and 4-6 hours for long-acting). With the possible exception of prolonged sympathetic blockade from the local anesthetics, benefits during this period are typically attributed to, or associated with, other factors such as analgesic sensory neuropraxia, antiinflammatory effects, or beneficial biochemical changes provided by agents other than the local anesthetics.  Reported result: Extended relief  following procedure: 0 % (Long-Term Relief)            Interpretative annotation: Clinically possible results. Recurrence of symptoms. The patient is still within the initial 6 weeks post procedure recovery phase. Etiology is likely mechanical rather than inflammatory. Pending full recovery in 6 weeks from procedure.  Current benefits: Defined as persistent relief that continues at this point in time.   Reported results: Treated area: 0 % The patient is still in the middle of the postop recovery phase. Interpretative annotation: Postprocedural flare up, possibly secondary to lesioning. The patient is still within the recovery phase.             Interpretation: Results would suggest therapy to have a limited impact on the patient's condition.                  Plan:  Proceed with plan to do the opposite side.  Laboratory Chemistry  Inflammation Markers (CRP: Acute Phase) (ESR: Chronic Phase) Lab Results  Component Value Date   CRP <0.5 08/21/2015   ESRSEDRATE 4 08/21/2015                 Renal Function Markers Lab Results  Component Value Date   BUN 13 11/25/2015   CREATININE 0.96 11/25/2015   GFRAA >60 11/25/2015   GFRNONAA >60 11/25/2015                 Hepatic Function Markers Lab Results  Component Value Date   AST 31 11/25/2015   ALT 41 11/25/2015   ALBUMIN 3.9 11/25/2015   ALKPHOS 71 11/25/2015                 Electrolytes Lab Results  Component Value Date   NA 138 11/25/2015   K 4.2 11/25/2015   CL  104 11/25/2015   CALCIUM 9.0 11/25/2015   MG 2.1 08/21/2015                 Neuropathy Markers No results found for: [id number]               Bone Pathology Markers Lab Results  Component Value Date   ALKPHOS 71 11/25/2015   CALCIUM 9.0 11/25/2015                 Coagulation Parameters Lab Results  Component Value Date   PLT 238 11/25/2015                 Cardiovascular Markers Lab Results  Component Value Date   HGB 14.6 11/25/2015   HCT 43.5  11/25/2015                 Note: Lab results reviewed.  Recent Diagnostic Imaging Review  Dg C-arm 1-60 Min-no Report  Result Date: 03/21/2017 Fluoroscopy was utilized by the requesting physician.  No radiographic interpretation.   Note: Imaging results reviewed.          Meds   Current Meds  Medication Sig  . ANDROGEL PUMP 20.25 MG/ACT (1.62%) GEL APPLY TO 2 PUMPS ONCE A DAY AS DIRECTED  . aspirin 81 MG tablet Take 81 mg by mouth daily.  . Cholecalciferol 5000 units capsule Take 1 capsule (5,000 Units total) by mouth daily.  Marland Kitchen gabapentin (NEURONTIN) 100 MG capsule Take 1-3 capsules (100-300 mg total) by mouth 4 (four) times daily. Follow written titration schedule.  Derrill Memo ON 06/02/2017] oxyCODONE (OXY IR/ROXICODONE) 5 MG immediate release tablet Take 1 tablet (5 mg total) by mouth every 8 (eight) hours as needed for severe pain.  Marland Kitchen oxyCODONE (OXY IR/ROXICODONE) 5 MG immediate release tablet Take 1 tablet (5 mg total) by mouth every 8 (eight) hours as needed for severe pain.  . pantoprazole (PROTONIX) 40 MG tablet Take 40 mg by mouth daily.  . simvastatin (ZOCOR) 10 MG tablet Take 10 mg by mouth at bedtime.     ROS  Constitutional: Denies any fever or chills Gastrointestinal: No reported hemesis, hematochezia, vomiting, or acute GI distress Musculoskeletal: Denies any acute onset joint swelling, redness, loss of ROM, or weakness Neurological: No reported episodes of acute onset apraxia, aphasia, dysarthria, agnosia, amnesia, paralysis, loss of coordination, or loss of consciousness  Allergies  Mr. Charnley has No Known Allergies.  PFSH  Drug: Mr. Maher  reports that he does not use drugs. Alcohol:  reports that he does not drink alcohol. Tobacco:  reports that he has never smoked. He has never used smokeless tobacco. Medical:  has a past medical history of Bilateral arm pain; Cardiomyopathy ([place]); Diverticulosis; Dupuytren's contracture; Foot pain, bilateral; GERD  (gastroesophageal reflux disease); Hand weakness; Hyperlipidemia; Knee pain; Midline low back pain; Osteoarthritis; Pain in neck; Prostate cancer ([place]); Reactive airway disease; Seasonal allergies; Situational anxiety; and Sleep apnea. Surgical: Mr. Vinje  has a past surgical history that includes Nose surgery; Hand surgery (Bilateral); Colonoscopy (10/24/2012); Upper gi endoscopy (12/18/2012); Tonsillectomy (2009); Skin cancer excision (Left, 2015); Mass excision (Right, 07/18/2015); Esophagogastroduodenoscopy (egd) with propofol (N/A, 10/28/2015); prostate seeding; Hernia repair; and Cholecystectomy (N/A, 12/02/2015). Family: family history includes Colon cancer in his brother; Hyperlipidemia in his sister; Lung cancer in his father; Prostate cancer in his brother.  Constitutional Exam  General appearance: Well nourished, well developed, and well hydrated. In no apparent acute distress Vitals:   05/09/17 1410  BP: 121/84  Pulse: 86  Resp: 18  Temp: 98.3 F (36.8 C)  TempSrc: Oral  SpO2: 97%  Weight: 185 lb (83.9 kg)  Height: 5' 9"  (1.753 m)   BMI Assessment: Estimated body mass index is 27.32 kg/m as calculated from the following:   Height as of this encounter: 5' 9"  (1.753 m).   Weight as of this encounter: 185 lb (83.9 kg).  BMI interpretation table: BMI level Category Range association with higher incidence of chronic pain  <18 kg/m2 Underweight   18.5-24.9 kg/m2 Ideal body weight   25-29.9 kg/m2 Overweight Increased incidence by 20%  30-34.9 kg/m2 Obese (Class I) Increased incidence by 68%  35-39.9 kg/m2 Severe obesity (Class II) Increased incidence by 136%  >40 kg/m2 Extreme obesity (Class III) Increased incidence by 254%   BMI Readings from Last 4 Encounters:  05/09/17 27.32 kg/m  03/21/17 27.32 kg/m  03/15/17 27.32 kg/m  01/27/17 27.32 kg/m   Wt Readings from Last 4 Encounters:  05/09/17 185 lb (83.9 kg)  03/21/17 185 lb (83.9 kg)  03/15/17 185 lb (83.9 kg)   01/27/17 185 lb (83.9 kg)  Psych/Mental status: Alert, oriented x 3 (person, place, & time)       Eyes: PERLA Respiratory: No evidence of acute respiratory distress  Cervical Spine Area Exam  Skin & Axial Inspection: No masses, redness, edema, swelling, or associated skin lesions Alignment: Symmetrical Functional ROM: Unrestricted ROM      Stability: No instability detected Muscle Tone/Strength: Functionally intact. No obvious neuro-muscular anomalies detected. Sensory (Neurological): Unimpaired Palpation: No palpable anomalies              Upper Extremity (UE) Exam    Side: Right upper extremity  Side: Left upper extremity  Skin & Extremity Inspection: Skin color, temperature, and hair growth are WNL. No peripheral edema or cyanosis. No masses, redness, swelling, asymmetry, or associated skin lesions. No contractures.  Skin & Extremity Inspection: Skin color, temperature, and hair growth are WNL. No peripheral edema or cyanosis. No masses, redness, swelling, asymmetry, or associated skin lesions. No contractures.  Functional ROM: Unrestricted ROM          Functional ROM: Unrestricted ROM          Muscle Tone/Strength: Functionally intact. No obvious neuro-muscular anomalies detected.  Muscle Tone/Strength: Functionally intact. No obvious neuro-muscular anomalies detected.  Sensory (Neurological): Unimpaired          Sensory (Neurological): Unimpaired          Palpation: No palpable anomalies              Palpation: No palpable anomalies              Specialized Test(s): Deferred         Specialized Test(s): Deferred          Thoracic Spine Area Exam  Skin & Axial Inspection: No masses, redness, or swelling Alignment: Symmetrical Functional ROM: Unrestricted ROM Stability: No instability detected Muscle Tone/Strength: Functionally intact. No obvious neuro-muscular anomalies detected. Sensory (Neurological): Unimpaired Muscle strength & Tone: No palpable anomalies  Lumbar Spine Area  Exam  Skin & Axial Inspection: No masses, redness, or swelling Alignment: Symmetrical Functional ROM: Limited ROM      Stability: No instability detected Muscle Tone/Strength: Functionally intact. No obvious neuro-muscular anomalies detected. Sensory (Neurological): Movement-associated pain Palpation: Complains of area being tender to palpation       Provocative Tests: Lumbar Hyperextension and rotation test: Positive bilaterally for facet joint pain. Lumbar Lateral bending test:  evaluation deferred today       Patrick's Maneuver: Positive for bilateral S-I arthralgia              Gait & Posture Assessment  Ambulation: Limited Gait: Antalgic Posture: Antalgic   Lower Extremity Exam    Side: Right lower extremity  Side: Left lower extremity  Skin & Extremity Inspection: Skin color, temperature, and hair growth are WNL. No peripheral edema or cyanosis. No masses, redness, swelling, asymmetry, or associated skin lesions. No contractures.  Skin & Extremity Inspection: Skin color, temperature, and hair growth are WNL. No peripheral edema or cyanosis. No masses, redness, swelling, asymmetry, or associated skin lesions. No contractures.  Functional ROM: Unrestricted ROM          Functional ROM: Unrestricted ROM          Muscle Tone/Strength: Functionally intact. No obvious neuro-muscular anomalies detected.  Muscle Tone/Strength: Functionally intact. No obvious neuro-muscular anomalies detected.  Sensory (Neurological): Unimpaired  Sensory (Neurological): Unimpaired  Palpation: No palpable anomalies  Palpation: No palpable anomalies   Assessment  Primary Diagnosis & Pertinent Problem List: The primary encounter diagnosis was Chronic low back pain  (Location of Primary Pain) (Bilateral) (L>R). Diagnoses of Chronic sacroiliac joint pain (Bilateral) (L>R), Lumbar facet syndrome (Bilateral) (L>R), Neurogenic pain, and Vitamin D insufficiency were also pertinent to this visit.  Status Diagnosis   Persistent Persistent Persistent 1. Chronic low back pain  (Location of Primary Pain) (Bilateral) (L>R)   2. Chronic sacroiliac joint pain (Bilateral) (L>R)   3. Lumbar facet syndrome (Bilateral) (L>R)   4. Neurogenic pain   5. Vitamin D insufficiency     Problems updated and reviewed during this visit: No problems updated. Plan of Care  Pharmacotherapy (Medications Ordered): Meds ordered this encounter  Medications  . gabapentin (NEURONTIN) 100 MG capsule    Sig: Take 1-3 capsules (100-300 mg total) by mouth 4 (four) times daily. Follow written titration schedule.    Dispense:  360 capsule    Refill:  0    Do not place this medication, or any other prescription from our practice, on "Automatic Refill". Patient may have prescription filled one day early if pharmacy is closed on scheduled refill date.  . Cholecalciferol 5000 units capsule    Sig: Take 1 capsule (5,000 Units total) by mouth daily.    Dispense:  90 capsule    Refill:  0    Do not place medication on "Automatic Refill". Fill one day early if pharmacy is closed on scheduled refill date.   New Prescriptions   CHOLECALCIFEROL 5000 UNITS CAPSULE    Take 1 capsule (5,000 Units total) by mouth daily.   GABAPENTIN (NEURONTIN) 100 MG CAPSULE    Take 1-3 capsules (100-300 mg total) by mouth 4 (four) times daily. Follow written titration schedule.   Medications administered today: Mr. Caradine had no medications administered during this visit.   Procedure Orders     Radiofrequency,Lumbar     Radiofrequency Sacroiliac Joint Lab Orders  No laboratory test(s) ordered today   Imaging Orders  No imaging studies ordered today   Referral Orders  No referral(s) requested today    Interventional management options: Planned, scheduled, and/or pending:   Schedule Right SI + Lumbar Facet RFA.   Considering:   Palliative Left cervical epidural steroid injection  Possible bilateral sacroiliac joint + lumbar facet  RFA Diagnostic Bilateral Hip Block Diagnostic bilateral femoral nerve and obturator nerve block Possible bilateral femoral nerve and obturator nerve RFA Diagnostic bilateral  cervical facet block. Possible bilateral cervical facet RFA. Diagnostic Caudal ESI + diagnostic epidurogram Diagnostic Right Knee injection. Possible series of 5 right-sided intra-articular Hyalgan knee injections Diagnostic right Genicular NB Possible right Genicular Nerve RFA. Diagnostic bilateral intra-articular shoulder injection Diagnostic bilateral suprascapular NB. Possible bilateral suprascapular Nerve RFA.   Palliative PRN treatment(s):   Diagnostic Bilateral Lumbar Facet & Bilateral S-I joint block  Diagnostic Bilateral Hip Block Diagnostic bilateral cervical facet block. Diagnostic Caudal ESI. Diagnostic Left CESI. Diagnostic Right Knee injection. Diagnostic right Genicular NB Diagnostic bilateral intra-articular shoulder inj. Diagnostic bilateral suprascapular NB.   Provider-requested follow-up: Return for RFA procedure under fluoro and sedation: (R) L-FCT + SI RFA.  Future Appointments Date Time Provider [place]  06/14/2017 11:00 AM Vevelyn Francois, NP [facility] None  06/16/2017 10:30 AM Milinda Pointer, MD [facility] None   Primary Care Physician: Sofie Hartigan, MD Location: [facility] Outpatient Pain Management Facility Note by: Gaspar Cola, MD Date: 05/09/2017; Time: 3:20 PM

## 2017-05-09 NOTE — Progress Notes (Signed)
Nursing Pain Medication Assessment:  Safety precautions to be maintained throughout the outpatient stay will include: orient to surroundings, keep bed in low position, maintain call bell within reach at all times, provide assistance with transfer out of bed and ambulation.  Medication Inspection Compliance: Pill count conducted under aseptic conditions, in front of the patient. Neither the pills nor the bottle was removed from the patient's sight at any time. Once count was completed pills were immediately returned to the patient in their original bottle.  Medication: Oxycodone IR Pill/Patch Count: 39 of 90 pills remain Pill/Patch Appearance: Markings consistent with prescribed medication Bottle Appearance: Standard pharmacy container. Clearly labeled. Filled Date: 08 / 01 / 2018 Last Medication intake:  Today

## 2017-05-09 NOTE — Patient Instructions (Signed)
____________________________________________________________________________________________  Gabapentin Titration  Medication used: Gabapentin (Generic Name) or Neurontin (Brand Name) 100 mg tablets/capsules  Reasons to stop increasing the dose:  Reason 1: You get good relief of symptoms, in which case there is no need to increase the daily dose any further.    Reason 2: You develop some side effects, such as sleeping all of the time, difficulty concentrating, or becoming disoriented, in which case you need to go down on the dose, to the prior level, where you were not experiencing any side effects. Stay on that dose longer, to allow more time for your body to get use it, before attempting to increase it again.   Steps to increase medication: Step 1: Start by taking 1 (one) tablet at bedtime x 7 (seven) days.  Step 2: Increase dose to 2 (two) tablets at bedtime. Stay on this dose x 7 (seven) days.  Step 3: Next increase it to 3 (three) tablets at bedtime. Stay on this dose x another 7 (seven) days.  Step 4: Next, add 1 (one) tablet at noon with lunch. Continue this dose x another 7 (seven) days.  Step 5: Add 1 (one) tablet in the afternoon with dinner. Stay on this dose x another 7 (seven) days.  Step 6: At this point you should be taking the medicine 4 (four) times a day. This daily regimen of taking the medicine 4 (four) times a day, will be maintained from now on. You should not take any doses any sooner than every 6 (six) hours.  Step 7: After 7 (seven) days of taking 3 (three) tablet at bedtime, 1 (one) tablet at noon, 1 (one) tablet in the afternoon, and 1 (one) tablet in the morning, begin taking 2 (two) tablets at noon with lunch. Stay on this dose x another 7 (seven) days.   Step 8: After 7 (seven) days of taking 3 (three) tablet at bedtime, 2 (two) tablets at noon, 1 (one) tablet in the afternoon, and 1 (one) tablet in the morning, begin taking 2 (two) tablets in the afternoon  with dinner. Stay on this dose x another 7 (seven) days.   Step 9: After 7 (seven) days of taking 3 (three) tablet at bedtime, 2 (two) tablets at noon, 2 (two) tablets in the afternoon, and 1 (one) tablet in the morning, begin taking 2 (two) tablets in the morning with breakfast. Stay on this dose x another 7 (seven) days. At this point you should be taking the medicine 4 (four) times a day, or about every 6 (six) hours. This daily regimen of taking the medicine 4 (four) times a day, will be maintained from now on. You should not take any doses any sooner than every 6 (six) hours.  Step 10: After 7 (seven) days of taking 3 (three) tablet at bedtime, 2 (two) tablets at noon, 2 (two) tablets in the afternoon, and 2 (two) tablets in the morning, begin taking 3 (three) tablets at noon with lunch. Stay on this dose x another 7 (seven) days.   Step 11: After 7 (seven) days of taking 3 (three) tablet at bedtime, 3 (three) tablets at noon, 2 (two) tablets in the afternoon, and 2 (two) tablets in the morning, begin taking 3 (three) tablets in the afternoon with dinner. Stay on this dose x another 7 (seven) days.   Step 12: After 7 (seven) days of taking 3 (three) tablet at bedtime, 3 (three) tablets at noon, 3 (three) tablets in the afternoon, and 2 (two)  tablet in the morning, begin taking 3 (three) tablets in the morning with breakfast. Stay on this dose x another 7 (seven) days. At this point you should be taking the medicine 4 (four) times a day, or about every 6 (six) hours. This daily regimen of taking the medicine 4 (four) times a day, will be maintained from now on.   Endpoint: Once you have reached the maximum dose you can tolerate without side-effects, contact your physician so as to evaluate the results of the regimen.   Questions: Feel free to contact us for any questions or problems at [phone]  [phone] ____________________________________________________________________________________________ ____________________________________________________________________________________________  Preparing for Procedure with Sedation Instructions: . Oral Intake: Do not eat or drink anything for at least 8 hours prior to your procedure. . Transportation: Public transportation is not allowed. Bring an adult driver. The driver must be physically present in our waiting room before any procedure can be started. Marland Kitchen Physical Assistance: Bring an adult physically capable of assisting you, in the event you need help. This adult should keep you company at home for at least 6 hours after the procedure. . Blood Pressure Medicine: Take your blood pressure medicine with a sip of water the morning of the procedure. . Blood thinners:  . Diabetics on insulin: Notify the staff so that you can be scheduled 1st case in the morning. If your diabetes requires high dose insulin, take only  of your normal insulin dose the morning of the procedure and notify the staff that you have done so. . Preventing infections: Shower with an antibacterial soap the morning of your procedure. . Build-up your immune system: Take 1000 mg of Vitamin C with every meal (3 times a day) the day prior to your procedure. Marland Kitchen Antibiotics: Inform the staff if you have a condition or reason that requires you to take antibiotics before dental procedures. . Pregnancy: If you are pregnant, call and cancel the procedure. . Sickness: If you have a cold, fever, or any active infections, call and cancel the procedure. . Arrival: You must be in the facility at least 30 minutes prior to your scheduled procedure. . Children: Do not bring children with you. . Dress appropriately: Bring dark clothing that you would not mind if they get stained. . Valuables: Do not bring any jewelry or valuables. Procedure appointments are reserved for interventional  treatments only. Marland Kitchen No Prescription Refills. . No medication changes will be discussed during procedure appointments. . No disability issues will be discussed. ____________________________________________________________________________________________

## 2017-06-14 ENCOUNTER — Ambulatory Visit: Payer: Medicare Other | Attending: Nurse Practitioner | Admitting: Nurse Practitioner

## 2017-06-15 ENCOUNTER — Ambulatory Visit: Payer: Medicare Other | Admitting: Pain Medicine

## 2017-06-15 NOTE — Progress Notes (Deleted)
Patient's Name: Ricky Mercer  MRN: [id number]  Referring Provider: Sofie Hartigan, MD  DOB: Jul 27, 1951  PCP: Sofie Hartigan, MD  DOS: 06/16/2017  Note by: Gaspar Cola, MD  Service setting: Ambulatory outpatient  Specialty: Interventional Pain Management  Patient type: Established  Location: [facility] (AMB) Pain Management Facility  Visit type: Interventional Procedure   Primary Reason for Visit: Interventional Pain Management Treatment. CC: No chief complaint on file.  Procedure:  Anesthesia, Analgesia, Anxiolysis:  Type: Therapeutic Medial Branch Facet & Sacroiliac joint Radiofrequency Ablation Region: Lumbosacral Level: L2, L3, L4, L5, S1, S2, & S3 Medial Branch Level(s) Laterality: Right-Sided  Type: Local Anesthesia with Moderate (Conscious) Sedation Local Anesthetic: Lidocaine 1% Route: Intravenous (IV) IV Access: Secured Sedation: Meaningful verbal contact was maintained at all times during the procedure  Indication(s): Analgesia and Anxiety   Indications: 1. Lumbar facet syndrome (Bilateral) (L>R)   2. Chronic sacroiliac joint pain (Bilateral) (L>R)   3. Lumbar spondylosis   4. Chronic low back pain  (Location of Primary Pain) (Bilateral) (L>R)    Mr. Eugene has either failed to respond, was unable to tolerate, or simply did not get enough benefit from other more conservative therapies including, but not limited to: 1. Over-the-counter medications 2. Anti-inflammatory medications 3. Muscle relaxants 4. Membrane stabilizers 5. Opioids 6. Physical therapy 7. Modalities (Heat, ice, etc.) 8. Invasive techniques such as nerve blocks. Mr. Schadt has attained more than 50% relief of the pain from a series of diagnostic injections conducted in separate occasions.  Pain Score: Pre-procedure:  /10 Post-procedure:  /10  Pre-op Assessment:  Mr. Pinney is a 66 y.o. (year old), male patient, seen today for interventional treatment. He  has a past surgical history  that includes Nose surgery; Hand surgery (Bilateral); Colonoscopy (10/24/2012); Upper gi endoscopy (12/18/2012); Tonsillectomy (2009); Skin cancer excision (Left, 2015); Mass excision (Right, 07/18/2015); Esophagogastroduodenoscopy (egd) with propofol (N/A, 10/28/2015); prostate seeding; Hernia repair; and Cholecystectomy (N/A, 12/02/2015). Mr. Figley has a current medication list which includes the following prescription(s): androgel pump, aspirin, cholecalciferol, gabapentin, oxycodone, oxycodone, oxycodone, pantoprazole, and simvastatin. His primarily concern today is the No chief complaint on file.  Initial Vital Signs: There were no vitals taken for this visit. BMI: Estimated body mass index is 27.32 kg/m as calculated from the following:   Height as of 05/09/17: 5\' 9"  (1.753 m).   Weight as of 05/09/17: 185 lb (83.9 kg).  Risk Assessment: Allergies: Reviewed. He has No Known Allergies.  Allergy Precautions: None required Coagulopathies: Reviewed. None identified.  Blood-thinner therapy: None at this time Active Infection(s): Reviewed. None identified. Mr. Liew is afebrile  Site Confirmation: Mr. Cerasoli was asked to confirm the procedure and laterality before marking the site Procedure checklist: Completed Consent: Before the procedure and under the influence of no sedative(s), amnesic(s), or anxiolytics, the patient was informed of the treatment options, risks and possible complications. To fulfill our ethical and legal obligations, as recommended by the American Medical Association's Code of Ethics, I have informed the patient of my clinical impression; the nature and purpose of the treatment or procedure; the risks, benefits, and possible complications of the intervention; the alternatives, including doing nothing; the risk(s) and benefit(s) of the alternative treatment(s) or procedure(s); and the risk(s) and benefit(s) of doing nothing. The patient was provided information about the  general risks and possible complications associated with the procedure. These may include, but are not limited to: failure to achieve desired goals, infection, bleeding, organ or nerve damage, allergic  reactions, paralysis, and death. In addition, the patient was informed of those risks and complications associated to Spine-related procedures, such as failure to decrease pain; infection (i.e.: Meningitis, epidural or intraspinal abscess); bleeding (i.e.: epidural hematoma, subarachnoid hemorrhage, or any other type of intraspinal or peri-dural bleeding); organ or nerve damage (i.e.: Any type of peripheral nerve, nerve root, or spinal cord injury) with subsequent damage to sensory, motor, and/or autonomic systems, resulting in permanent pain, numbness, and/or weakness of one or several areas of the body; allergic reactions; (i.e.: anaphylactic reaction); and/or death. Furthermore, the patient was informed of those risks and complications associated with the medications. These include, but are not limited to: allergic reactions (i.e.: anaphylactic or anaphylactoid reaction(s)); adrenal axis suppression; blood sugar elevation that in diabetics may result in ketoacidosis or comma; water retention that in patients with history of congestive heart failure may result in shortness of breath, pulmonary edema, and decompensation with resultant heart failure; weight gain; swelling or edema; medication-induced neural toxicity; particulate matter embolism and blood vessel occlusion with resultant organ, and/or nervous system infarction; and/or aseptic necrosis of one or more joints. Finally, the patient was informed that Medicine is not an exact science; therefore, there is also the possibility of unforeseen or unpredictable risks and/or possible complications that may result in a catastrophic outcome. The patient indicated having understood very clearly. We have given the patient no guarantees and we have made no promises.  Enough time was given to the patient to ask questions, all of which were answered to the patient's satisfaction. Mr. Depree has indicated that he wanted to continue with the procedure. Attestation: I, the ordering provider, attest that I have discussed with the patient the benefits, risks, side-effects, alternatives, likelihood of achieving goals, and potential problems during recovery for the procedure that I have provided informed consent. Date: 06/16/2017; Time: 8:05 AM  Pre-Procedure Preparation:  Monitoring: As per clinic protocol. Respiration, ETCO2, SpO2, BP, heart rate and rhythm monitor placed and checked for adequate function Safety Precautions: Patient was assessed for positional comfort and pressure points before starting the procedure. Time-out: I initiated and conducted the "Time-out" before starting the procedure, as per protocol. The patient was asked to participate by confirming the accuracy of the "Time Out" information. Verification of the correct person, site, and procedure were performed and confirmed by me, the nursing staff, and the patient. "Time-out" conducted as per Joint Commission's Universal Protocol (UP.01.01.01). "Time-out" Date & Time: 06/16/2017;   hrs.  Description of Procedure Process:   Target Area: For Lumbar Facet blocks, the target is the groove formed by the junction of the transverse process and superior articular process. For the L5 dorsal ramus, the target is the notch between superior articular process and sacral ala. For the Sacral dorsal rami, the target is the superior and lateral edge of the posterior S1, S2, & S3 Sacral foramen. Approach: Paraspinal approach. Area Prepped: Entire Posterior Lumbosacral Region Prepping solution: Hibiclens (4.0% Chlorhexidine gluconate solution) Safety Precautions: Aspiration looking for blood return was conducted prior to all injections. At no point did we inject any substances, as a needle was being advanced. No attempts  were made at seeking any paresthesias. Safe injection practices and needle disposal techniques used. Medications properly checked for expiration dates. SDV (single dose vial) medications used. Description of the Procedure: Protocol guidelines were followed. The patient was placed in position over the fluoroscopy table. The target area was identified and the area prepped in the usual manner. Skin desensitized using vapocoolant spray.  Skin & deeper tissues infiltrated with local anesthetic. Appropriate amount of time allowed to pass for local anesthetics to take effect. Radiofrequency needles were introduced to the area of the medial branch at the junction of the superior articular process and transverse process using fluoroscopy. Using the [employer], sensory stimulation using 50 Hz was used to locate & identify the nerve, making sure that the needle was positioned such that there was no sensory stimulation below 0.3 V or above 0.7 V. Stimulation using 2 Hz was used to evaluate the motor component. Care was taken not to lesion any nerves that demonstrated motor stimulation of the lower extremities at an output of less than 2.5 times that of the sensory threshold, or a maximum of 2.0 V. Once satisfactory placement of the needles was achieved, the above solution was slowly injected after negative aspiration. After waiting for at least 2 minutes, the ablation was performed at 80 degrees C for 60 seconds.The needles were then removed and the area cleansed, making sure to leave some of the prepping solution back to take advantage of its long term bactericidal properties. Intra-operative Compliance: Compliant There were no vitals filed for this visit.  Start Time:   hrs. End Time:   hrs. Materials & Medications:  Needle(s) Type: Teflon-coated, curved tip, Radiofrequency needle(s) Gauge: 22G Length: 10cm Medication(s): Mr. Anaya had no medications administered during this visit. Please see  chart orders for dosing details.  Imaging Guidance (Spinal):  Type of Imaging Technique: Fluoroscopy Guidance (Spinal) Indication(s): Assistance in needle guidance and placement for procedures requiring needle placement in or near specific anatomical locations not easily accessible without such assistance. Exposure Time: Please see nurses notes. Contrast: None used. Fluoroscopic Guidance: I was personally present during the use of fluoroscopy. "Tunnel Vision Technique" used to obtain the best possible view of the target area. Parallax error corrected before commencing the procedure. "Direction-depth-direction" technique used to introduce the needle under continuous pulsed fluoroscopy. Once target was reached, antero-posterior, oblique, and lateral fluoroscopic projection used confirm needle placement in all planes. Images permanently stored in EMR. Interpretation: No contrast injected. I personally interpreted the imaging intraoperatively. Adequate needle placement confirmed in multiple planes. Permanent images saved into the patient's record.  Antibiotic Prophylaxis:  Indication(s): None identified Antibiotic given: None  Post-operative Assessment:  EBL: None Complications: No immediate post-treatment complications observed by team, or reported by patient. Note: The patient tolerated the entire procedure well. A repeat set of vitals were taken after the procedure and the patient was kept under observation following institutional policy, for this type of procedure. Post-procedural neurological assessment was performed, showing return to baseline, prior to discharge. The patient was provided with post-procedure discharge instructions, including a section on how to identify potential problems. Should any problems arise concerning this procedure, the patient was given instructions to immediately contact us, at any time, without hesitation. In any case, we plan to contact the patient by telephone for a  follow-up status report regarding this interventional procedure. Comments:  No additional relevant information.  Plan of Care   Imaging Orders  No imaging studies ordered today   Procedure Orders    No procedure(s) ordered today    Medications ordered for procedure: No orders of the defined types were placed in this encounter.  Medications administered: Mr. Njie had no medications administered during this visit.  See the medical record for exact dosing, route, and time of administration.  New Prescriptions   No medications on file   Disposition: Discharge  home  Discharge Date & Time: 06/16/2017;   hrs.   Physician-requested Follow-up: No Follow-up on file. Future Appointments Date Time Provider Minor  06/16/2017 10:30 AM Milinda Pointer, MD [facility] None   Primary Care Physician: Sofie Hartigan, MD Location: [facility] Note by: Gaspar Cola, MD Date: 06/16/2017; Time: 7:14 AM  Disclaimer:  Medicine is not an Chief Strategy Officer. The only guarantee in medicine is that nothing is guaranteed. It is important to note that the decision to proceed with this intervention was based on the information collected from the patient. The Data and conclusions were drawn from the patient's questionnaire, the interview, and the physical examination. Because the information was provided in large part by the patient, it cannot be guaranteed that it has not been purposely or unconsciously manipulated. Every effort has been made to obtain as much relevant data as possible for this evaluation. It is important to note that the conclusions that lead to this procedure are derived in large part from the available data. Always take into account that the treatment will also be dependent on availability of resources and existing treatment guidelines, considered by other Pain Management Practitioners as being common knowledge and practice, at the time of the  intervention. For Medico-Legal purposes, it is also important to point out that variation in procedural techniques and pharmacological choices are the acceptable norm. The indications, contraindications, technique, and results of the above procedure should only be interpreted and judged by a Board-Certified Interventional Pain Specialist with extensive familiarity and expertise in the same exact procedure and technique.

## 2017-06-16 ENCOUNTER — Ambulatory Visit: Payer: Medicare Other | Attending: Nurse Practitioner | Admitting: Nurse Practitioner

## 2017-06-16 ENCOUNTER — Encounter: Payer: Self-pay | Admitting: Nurse Practitioner

## 2017-06-16 ENCOUNTER — Ambulatory Visit: Payer: Medicare Other | Admitting: Pain Medicine

## 2017-06-16 VITALS — BP 136/86 | HR 86 | Temp 97.8°F | Resp 18

## 2017-06-16 DIAGNOSIS — E7800 Pure hypercholesterolemia, unspecified: Secondary | ICD-10-CM | POA: Diagnosis not present

## 2017-06-16 DIAGNOSIS — M4802 Spinal stenosis, cervical region: Secondary | ICD-10-CM | POA: Diagnosis not present

## 2017-06-16 DIAGNOSIS — G894 Chronic pain syndrome: Secondary | ICD-10-CM | POA: Insufficient documentation

## 2017-06-16 DIAGNOSIS — R079 Chest pain, unspecified: Secondary | ICD-10-CM | POA: Diagnosis not present

## 2017-06-16 DIAGNOSIS — Z9049 Acquired absence of other specified parts of digestive tract: Secondary | ICD-10-CM | POA: Insufficient documentation

## 2017-06-16 DIAGNOSIS — Z8546 Personal history of malignant neoplasm of prostate: Secondary | ICD-10-CM | POA: Diagnosis not present

## 2017-06-16 DIAGNOSIS — G629 Polyneuropathy, unspecified: Secondary | ICD-10-CM | POA: Insufficient documentation

## 2017-06-16 DIAGNOSIS — M25512 Pain in left shoulder: Secondary | ICD-10-CM | POA: Diagnosis not present

## 2017-06-16 DIAGNOSIS — M545 Low back pain: Secondary | ICD-10-CM | POA: Diagnosis not present

## 2017-06-16 DIAGNOSIS — M5442 Lumbago with sciatica, left side: Secondary | ICD-10-CM

## 2017-06-16 DIAGNOSIS — M7918 Myalgia, other site: Secondary | ICD-10-CM | POA: Insufficient documentation

## 2017-06-16 DIAGNOSIS — I429 Cardiomyopathy, unspecified: Secondary | ICD-10-CM | POA: Diagnosis not present

## 2017-06-16 DIAGNOSIS — M79605 Pain in left leg: Secondary | ICD-10-CM

## 2017-06-16 DIAGNOSIS — Z79891 Long term (current) use of opiate analgesic: Secondary | ICD-10-CM | POA: Diagnosis not present

## 2017-06-16 DIAGNOSIS — G4730 Sleep apnea, unspecified: Secondary | ICD-10-CM | POA: Diagnosis not present

## 2017-06-16 DIAGNOSIS — Z79899 Other long term (current) drug therapy: Secondary | ICD-10-CM | POA: Diagnosis not present

## 2017-06-16 DIAGNOSIS — Z9889 Other specified postprocedural states: Secondary | ICD-10-CM | POA: Insufficient documentation

## 2017-06-16 DIAGNOSIS — M792 Neuralgia and neuritis, unspecified: Secondary | ICD-10-CM

## 2017-06-16 DIAGNOSIS — M25511 Pain in right shoulder: Secondary | ICD-10-CM | POA: Diagnosis not present

## 2017-06-16 DIAGNOSIS — Z5181 Encounter for therapeutic drug level monitoring: Secondary | ICD-10-CM | POA: Diagnosis present

## 2017-06-16 DIAGNOSIS — Z7982 Long term (current) use of aspirin: Secondary | ICD-10-CM | POA: Diagnosis not present

## 2017-06-16 DIAGNOSIS — G8929 Other chronic pain: Secondary | ICD-10-CM

## 2017-06-16 DIAGNOSIS — M5441 Lumbago with sciatica, right side: Secondary | ICD-10-CM

## 2017-06-16 DIAGNOSIS — M79604 Pain in right leg: Secondary | ICD-10-CM

## 2017-06-16 DIAGNOSIS — M25561 Pain in right knee: Secondary | ICD-10-CM | POA: Insufficient documentation

## 2017-06-16 MED ORDER — PREGABALIN 25 MG PO CAPS: 25.0000 mg | ORAL_CAPSULE | Freq: Three times a day (TID) | ORAL | 0 refills | Status: DC

## 2017-06-16 MED ORDER — OXYCODONE HCL 5 MG PO TABS: 5.0000 mg | ORAL_TABLET | Freq: Three times a day (TID) | ORAL | 0 refills | Status: DC | PRN

## 2017-06-16 NOTE — Patient Instructions (Addendum)
____________________________________________________________________________________________  Medication Rules  Applies to: All patients receiving prescriptions (written or electronic).  Pharmacy of record: Pharmacy where electronic prescriptions will be sent. If written prescriptions are taken to a different pharmacy, please inform the nursing staff. The pharmacy listed in the electronic medical record should be the one where you would like electronic prescriptions to be sent.  Prescription refills: Only during scheduled appointments. Applies to both, written and electronic prescriptions.  NOTE: The following applies primarily to controlled substances (Opioid* Pain Medications).   Patient's responsibilities: 1. Pain Pills: Bring all pain pills to every appointment (except for procedure appointments). 2. Pill Bottles: Bring pills in original pharmacy bottle. Always bring newest bottle. Bring bottle, even if empty. 3. Medication refills: You are responsible for knowing and keeping track of what medications you need refilled. The day before your appointment, write a list of all prescriptions that need to be refilled. Bring that list to your appointment and give it to the admitting nurse. Prescriptions will be written only during appointments. If you forget a medication, it will not be "Called in", "Faxed", or "electronically sent". You will need to get another appointment to get these prescribed. 4. Prescription Accuracy: You are responsible for carefully inspecting your prescriptions before leaving our office. Have the discharge nurse carefully go over each prescription with you, before taking them home. Make sure that your name is accurately spelled, that your address is correct. Check the name and dose of your medication to make sure it is accurate. Check the number of pills, and the written instructions to make sure they are clear and accurate. Make sure that you are given enough medication to  last until your next medication refill appointment. 5. Taking Medication: Take medication as prescribed. Never take more pills than instructed. Never take medication more frequently than prescribed. Taking less pills or less frequently is permitted and encouraged, when it comes to controlled substances (written prescriptions).  6. Inform other Doctors: Always inform, all of your healthcare providers, of all the medications you take. 7. Pain Medication from other Providers: You are not allowed to accept any additional pain medication from any other Doctor or Healthcare provider. There are two exceptions to this rule. (see below) In the event that you require additional pain medication, you are responsible for notifying us, as stated below. 8. Medication Agreement: You are responsible for carefully reading and following our Medication Agreement. This must be signed before receiving any prescriptions from our practice. Safely store a copy of your signed Agreement. Violations to the Agreement will result in no further prescriptions. (Additional copies of our Medication Agreement are available upon request.) 9. Laws, Rules, & Regulations: All patients are expected to follow all Federal and State Laws, Statutes, Rules, & Regulations. Ignorance of the Laws does not constitute a valid excuse. The use of any illegal substances is prohibited. 10. Adopted CDC guidelines & recommendations: Target dosing levels will be at or below 60 MME/day. Use of benzodiazepines** is not recommended.  Exceptions: There are only two exceptions to the rule of not receiving pain medications from other Healthcare Providers. 1. Exception #1 (Emergencies): In the event of an emergency (i.e.: accident requiring emergency care), you are allowed to receive additional pain medication. However, you are responsible for: As soon as you are able, call our office [phone], at any time of the day or night, and leave a message stating your  name, the date and nature of the emergency, and the name and dose of the medication   prescribed. In the event that your call is answered by a member of our staff, make sure to document and save the date, time, and the name of the person that took your information.  2. Exception #2 (Planned Surgery): In the event that you are scheduled by another doctor or dentist to have any type of surgery or procedure, you are allowed (for a period no longer than 30 days), to receive additional pain medication, for the acute post-op pain. However, in this case, you are responsible for picking up a copy of our "Post-op Pain Management for Surgeons" handout, and giving it to your surgeon or dentist. This document is available at our office, and does not require an appointment to obtain it. Simply go to our office during business hours (Monday-Thursday from 8:00 AM to 4:00 PM) (Friday 8:00 AM to 12:00 Noon) or if you have a scheduled appointment with [place], prior to your surgery, and ask for it by name. In addition, you will need to provide [place] with your name, name of your surgeon, type of surgery, and date of procedure or surgery.  *Opioid medications include: morphine, codeine, oxycodone, oxymorphone, hydrocodone, hydromorphone, meperidine, tramadol, tapentadol, buprenorphine, fentanyl, methadone. **Benzodiazepine medications include: diazepam (Valium), alprazolam (Xanax), clonazepam (Klonopine), lorazepam (Ativan), clorazepate (Tranxene), chlordiazepoxide (Librium), estazolam (Prosom), oxazepam (Serax), temazepam (Restoril), triazolam (Halcion)  ____________________________________________________________________________________________  BMI Assessment: Estimated body mass index is 27.32 kg/m as calculated from the following:   Height as of 05/09/17: 5\' 9"  (1.753 m).   Weight as of 05/09/17: 185 lb (83.9 kg).  BMI interpretation table: BMI level Category Range association with higher incidence of chronic pain  <18 kg/m2  Underweight   18.5-24.9 kg/m2 Ideal body weight   25-29.9 kg/m2 Overweight Increased incidence by 20%  30-34.9 kg/m2 Obese (Class I) Increased incidence by 68%  35-39.9 kg/m2 Severe obesity (Class II) Increased incidence by 136%  >40 kg/m2 Extreme obesity (Class III) Increased incidence by 254%   BMI Readings from Last 4 Encounters:  05/09/17 27.32 kg/m  03/21/17 27.32 kg/m  03/15/17 27.32 kg/m  01/27/17 27.32 kg/m   Wt Readings from Last 4 Encounters:  05/09/17 185 lb (83.9 kg)  03/21/17 185 lb (83.9 kg)  03/15/17 185 lb (83.9 kg)  01/27/17 185 lb (83.9 kg)

## 2017-06-16 NOTE — Progress Notes (Signed)
Nursing Pain Medication Assessment:  Safety precautions to be maintained throughout the outpatient stay will include: orient to surroundings, keep bed in low position, maintain call bell within reach at all times, provide assistance with transfer out of bed and ambulation.  Medication Inspection Compliance: Pill count conducted under aseptic conditions, in front of the patient. Neither the pills nor the bottle was removed from the patient's sight at any time. Once count was completed pills were immediately returned to the patient in their original bottle.  Medication: Oxycodone IR Pill/Patch Count: 32 of 90 pills remain Pill/Patch Appearance: Markings consistent with prescribed medication Bottle Appearance: Standard pharmacy container. Clearly labeled. Filled Date: 09/09 / 2018 Last Medication intake:  Today

## 2017-06-16 NOTE — Progress Notes (Signed)
Patient's Name: ARSEN MANGIONE  MRN: [id number]  Referring Provider: Sofie Hartigan, MD  DOB: 1950-11-26  PCP: Sofie Hartigan, MD  DOS: 06/16/2017  Note by: Vevelyn Francois NP  Service setting: Ambulatory outpatient  Specialty: Interventional Pain Management  Location: [facility] (AMB) Pain Management Facility    Patient type: Established    Primary Reason(s) for Visit: Encounter for prescription drug management. (Level of risk: moderate)  CC: Back Pain (lower)  HPI  Mr. Chelf is a 66 y.o. year old, male patient, who comes today for a medication management evaluation. He has Hand weakness; Polyradiculopathy; Long term current use of opiate analgesic; Long term prescription opiate use; Opiate use (45 MME/Day); Opiate dependence ([employer] [facility]); Encounter for therapeutic drug level monitoring; Chronic low back pain  (Location of Primary Pain) (Bilateral) (L>R); Chronic lower extremity pain (Location of Tertiary source of pain) (Bilateral) (L>R); Chronic lumbar radicular pain (Bilateral) (L>R) (Bilateral S1 dermatomal distribution); Chronic hip pain (Location of Secondary source of pain) (Bilateral) (L>R); Chronic upper back pain (Bilateral) (L>R); Chronic neck pain (neck<shoulder) (L>R); Chronic foot pain; Chronic knee pain (Right); Cervical spondylosis; 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); Chronic upper extremity pain (Bilateral) (L>R); Lumbar spondylosis; Cervical facet hypertrophy; Cervical facet syndrome (R>L); History of prostate cancer; Radicular pain of shoulder; Class I Morbid obesity (HCC) (68% higher incidence of chronic low back problems); Peripheral neuropathy (HCC) (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); Lumbar facet syndrome (Bilateral) (L>R); Sleep apnea; Depression; High cholesterol; Low testosterone; History of surgical procedure; Opioid-induced constipation (OIC); Chronic shoulder pain (Bilateral) (L>R); Breathing orally; Chest pain; Primary cardiomyopathy  ([place]); Chronic sacroiliac joint pain (Bilateral) (L>R); Musculoskeletal pain; Pain of right foot; Neurogenic pain; Chest pain with high risk for cardiac etiology; Chronic pain syndrome; Bilateral arm pain; Acute postoperative pain; and Vitamin D insufficiency on his problem list. His primarily concern today is the Back Pain (lower)  Pain Assessment: Location: Lower Back Radiating: both legs to the feet Onset: More than a month ago Duration: Chronic pain Quality: Burning Severity: 5 /10 (self-reported pain score)  Note: Reported level is compatible with observation.                    Effect on ADL:   Timing: Constant Modifying factors: medication  Mr. Mcquitty was last scheduled for an appointment on 06/14/2017 for medication management. During today's appointment we reviewed Mr. Groman chronic pain status, as well as his outpatient medication regimen. He states that he is has tried the Gabapentin. He tired to be compliant with the titration however he is not able tolerate. He states that it made him sleepy and confused. He does have sleep apnea but is not unable to use the sleep. He admits that he has not used his CPAP machine in over one year because he can not tolerate it. He admits that he follows up with ENT and they have performed many procedures. He continues to have increased unrestful nights and the Gabapentin just intensified everything.   The patient  reports that he does not use drugs. His body mass index is unknown because there is no height or weight on file.  Further details on both, my assessment(s), as well as the proposed treatment plan, please see below.  Controlled Substance Pharmacotherapy Assessment REMS (Risk Evaluation and Mitigation Strategy)  Analgesic:Oxycodone IR 54m every 8 hours (120mday) MME/day:[id number]. WheLandis MartinsN  06/16/2017 10:39 AM  Sign at close encounter Nursing Pain Medication Assessment:  Safety precautions  to be maintained  throughout the outpatient stay will include: orient to surroundings, keep bed in low position, maintain call bell within reach at all times, provide assistance with transfer out of bed and ambulation.  Medication Inspection Compliance: Pill count conducted under aseptic conditions, in front of the patient. Neither the pills nor the bottle was removed from the patient's sight at any time. Once count was completed pills were immediately returned to the patient in their original bottle.  Medication: Oxycodone IR Pill/Patch Count: 32 of 90 pills remain Pill/Patch Appearance: Markings consistent with prescribed medication Bottle Appearance: Standard pharmacy container. Clearly labeled. Filled Date: 09/09 / 2018 Last Medication intake:  Today   Pharmacokinetics: Liberation and absorption (onset of action): WNL Distribution (time to peak effect): WNL Metabolism and excretion (duration of action): WNL         Pharmacodynamics: Desired effects: Analgesia: Mr. Yurkovich reports >50% benefit. Functional ability: Patient reports that medication allows him to accomplish basic ADLs Clinically meaningful improvement in function (CMIF): Sustained CMIF goals met Perceived effectiveness: Described as relatively effective, allowing for increase in activities of daily living (ADL) Undesirable effects: Side-effects or Adverse reactions: None reported Monitoring: [place] PMP: Online review of the past 33-monthperiod conducted. Compliant with practice rules and regulations Last UDS on record: Summary  Date Value Ref Range Status  03/15/2017 FINAL  Final    Comment:    ==================================================================== TOXASSURE SELECT 13 (MW) ==================================================================== Test                             Result       Flag       Units Drug Present and Declared for Prescription Verification   Oxycodone                      610          EXPECTED   ng/mg  creat   Oxymorphone                    364          EXPECTED   ng/mg creat   Noroxycodone                   821          EXPECTED   ng/mg creat   Noroxymorphone                 214          EXPECTED   ng/mg creat    Sources of oxycodone are scheduled prescription medications.    Oxymorphone, noroxycodone, and noroxymorphone are expected    metabolites of oxycodone. Oxymorphone is also available as a    scheduled prescription medication. ==================================================================== Test                      Result    Flag   Units      Ref Range   Creatinine              325              mg/dL      >=20 ==================================================================== Declared Medications:  The flagging and interpretation on this report are based on the  following declared medications.  Unexpected results may arise from  inaccuracies in the declared medications.  **Note: The testing scope of this panel includes these medications:  Oxycodone (  Roxicodone)  **Note: The testing scope of this panel does not include following  reported medications:  Aspirin (Aspirin 81)  Cyclobenzaprine (Flexeril)  Pantoprazole (Protonix)  Sertraline (Zoloft)  Simvastatin (Zocor)  Testosterone  Venlafaxine (Effexor) ==================================================================== For clinical consultation, please call [phone]. ====================================================================    UDS interpretation: Compliant          Medication Assessment Form: Reviewed. Patient indicates being compliant with therapy Treatment compliance: Compliant Risk Assessment Profile: Aberrant behavior: See prior evaluations. None observed or detected today Comorbid factors increasing risk of overdose: See prior notes. No additional risks detected today Risk of substance use disorder (SUD): Low  ORT Scoring interpretation table:  Score <3 = Low Risk for SUD  Score between  4-7 = Moderate Risk for SUD  Score >8 = High Risk for Opioid Abuse   Risk Mitigation Strategies:  Patient Counseling: Covered Patient-Prescriber Agreement (PPA): Present and active  Notification to other healthcare providers: Done  Pharmacologic Plan: No change in therapy, at this time  Laboratory Chemistry  Inflammation Markers (CRP: Acute Phase) (ESR: Chronic Phase) Lab Results  Component Value Date   CRP <0.5 08/21/2015   ESRSEDRATE 4 08/21/2015                 Renal Function Markers Lab Results  Component Value Date   BUN 13 11/25/2015   CREATININE 0.96 11/25/2015   GFRAA >60 11/25/2015   GFRNONAA >60 11/25/2015                 Hepatic Function Markers Lab Results  Component Value Date   AST 31 11/25/2015   ALT 41 11/25/2015   ALBUMIN 3.9 11/25/2015   ALKPHOS 71 11/25/2015                 Electrolytes Lab Results  Component Value Date   NA 138 11/25/2015   K 4.2 11/25/2015   CL 104 11/25/2015   CALCIUM 9.0 11/25/2015   MG 2.1 08/21/2015                 Neuropathy Markers No results found for: [id number]               Bone Pathology Markers Lab Results  Component Value Date   ALKPHOS 71 11/25/2015   CALCIUM 9.0 11/25/2015                 Coagulation Parameters Lab Results  Component Value Date   PLT 238 11/25/2015                 Cardiovascular Markers Lab Results  Component Value Date   HGB 14.6 11/25/2015   HCT 43.5 11/25/2015                 Note: Lab results reviewed.  Recent Diagnostic Imaging Results  DG C-Arm 1-60 Min-No Report Fluoroscopy was utilized by the requesting physician.  No radiographic  interpretation.   Complexity Note: Imaging results reviewed. Results shared with Mr. Camacho, using Layman's terms.                         Meds   Current Outpatient Prescriptions:  .  ANDROGEL PUMP 20.25 MG/ACT (1.62%) GEL, APPLY TO 2 PUMPS ONCE A DAY AS DIRECTED, Disp: , Rfl: 5 .  aspirin 81 MG tablet, Take 81 mg by mouth  daily., Disp: , Rfl:  .  Cholecalciferol 5000 units capsule, Take 1 capsule (5,000 Units total) by mouth daily., Disp:  90 capsule, Rfl: 0 .  [START ON 07/02/2017] oxyCODONE (OXY IR/ROXICODONE) 5 MG immediate release tablet, Take 1 tablet (5 mg total) by mouth every 8 (eight) hours as needed for severe pain., Disp: 90 tablet, Rfl: 0 .  pantoprazole (PROTONIX) 40 MG tablet, Take 40 mg by mouth daily., Disp: , Rfl:  .  simvastatin (ZOCOR) 10 MG tablet, Take 10 mg by mouth at bedtime. , Disp: , Rfl:  .  gabapentin (NEURONTIN) 100 MG capsule, Take 1-3 capsules (100-300 mg total) by mouth 4 (four) times daily. Follow written titration schedule., Disp: 360 capsule, Rfl: 0 .  [START ON 08/01/2017] oxyCODONE (OXY IR/ROXICODONE) 5 MG immediate release tablet, Take 1 tablet (5 mg total) by mouth every 8 (eight) hours as needed for severe pain., Disp: 90 tablet, Rfl: 0 .  [START ON 08/31/2017] oxyCODONE (OXY IR/ROXICODONE) 5 MG immediate release tablet, Take 1 tablet (5 mg total) by mouth every 8 (eight) hours as needed for severe pain., Disp: 90 tablet, Rfl: 0 .  pregabalin (LYRICA) 25 MG capsule, Take 1 capsule (25 mg total) by mouth 3 (three) times daily., Disp: 90 capsule, Rfl: 0  ROS  Constitutional: Denies any fever or chills Gastrointestinal: No reported hemesis, hematochezia, vomiting, or acute GI distress Musculoskeletal: Denies any acute onset joint swelling, redness, loss of ROM, or weakness Neurological: No reported episodes of acute onset apraxia, aphasia, dysarthria, agnosia, amnesia, paralysis, loss of coordination, or loss of consciousness  Allergies  Mr. Holtmeyer has No Known Allergies.  PFSH  Drug: Mr. Akel  reports that he does not use drugs. Alcohol:  reports that he does not drink alcohol. Tobacco:  reports that he has never smoked. He has never used smokeless tobacco. Medical:  has a past medical history of Bilateral arm pain; Cardiomyopathy ([place]); Diverticulosis; Dupuytren's  contracture; Foot pain, bilateral; GERD (gastroesophageal reflux disease); Hand weakness; Hyperlipidemia; Knee pain; Midline low back pain; Osteoarthritis; Pain in neck; Prostate cancer ([place]); Reactive airway disease; Seasonal allergies; Situational anxiety; and Sleep apnea. Surgical: Mr. Ngo  has a past surgical history that includes Nose surgery; Hand surgery (Bilateral); Colonoscopy (10/24/2012); Upper gi endoscopy (12/18/2012); Tonsillectomy (2009); Skin cancer excision (Left, 2015); Mass excision (Right, 07/18/2015); Esophagogastroduodenoscopy (egd) with propofol (N/A, 10/28/2015); prostate seeding; Hernia repair; and Cholecystectomy (N/A, 12/02/2015). Family: family history includes Colon cancer in his brother; Hyperlipidemia in his sister; Lung cancer in his father; Prostate cancer in his brother.  Constitutional Exam  General appearance: Well nourished, well developed, and well hydrated. In no apparent acute distress Vitals:   06/16/17 1031  BP: 136/86  Pulse: 86  Resp: 18  Temp: 97.8 F (36.6 C)  TempSrc: Oral  SpO2: 100%  Psych/Mental status: Alert, oriented x 3 (person, place, & time)       Eyes: PERLA Respiratory: No evidence of acute respiratory distress  Cervical Spine Area Exam  Skin & Axial Inspection: No masses, redness, edema, swelling, or associated skin lesions Alignment: Symmetrical Functional ROM: Unrestricted ROM      Stability: No instability detected Muscle Tone/Strength: Functionally intact. No obvious neuro-muscular anomalies detected. Sensory (Neurological): Unimpaired Palpation: No palpable anomalies              Upper Extremity (UE) Exam    Side: Right upper extremity  Side: Left upper extremity  Skin & Extremity Inspection: Skin color, temperature, and hair growth are WNL. No peripheral edema or cyanosis. No masses, redness, swelling, asymmetry, or associated skin lesions. No contractures.  Skin & Extremity Inspection: Skin color,  temperature, and hair  growth are WNL. No peripheral edema or cyanosis. No masses, redness, swelling, asymmetry, or associated skin lesions. No contractures.  Functional ROM: Unrestricted ROM          Functional ROM: Unrestricted ROM          Muscle Tone/Strength: Functionally intact. No obvious neuro-muscular anomalies detected.  Muscle Tone/Strength: Functionally intact. No obvious neuro-muscular anomalies detected.  Sensory (Neurological): Unimpaired          Sensory (Neurological): Unimpaired          Palpation: No palpable anomalies              Palpation: No palpable anomalies              Specialized Test(s): Deferred         Specialized Test(s): Deferred          Thoracic Spine Area Exam  Skin & Axial Inspection: No masses, redness, or swelling Alignment: Symmetrical Functional ROM: Unrestricted ROM Stability: No instability detected Muscle Tone/Strength: Functionally intact. No obvious neuro-muscular anomalies detected. Sensory (Neurological): Unimpaired Muscle strength & Tone: No palpable anomalies  Lumbar Spine Area Exam  Skin & Axial Inspection: No masses, redness, or swelling Alignment: Symmetrical Functional ROM: Unrestricted ROM      Stability: No instability detected Muscle Tone/Strength: Functionally intact. No obvious neuro-muscular anomalies detected. Sensory (Neurological): Unimpaired Palpation: No palpable anomalies       Provocative Tests: Lumbar Hyperextension and rotation test: evaluation deferred today       Lumbar Lateral bending test: evaluation deferred today       Patrick's Maneuver: evaluation deferred today                    Gait & Posture Assessment  Ambulation: Unassisted Gait: Relatively normal for age and body habitus Posture: WNL   Lower Extremity Exam    Side: Right lower extremity  Side: Left lower extremity  Skin & Extremity Inspection: Skin color, temperature, and hair growth are WNL. No peripheral edema or cyanosis. No masses, redness, swelling, asymmetry, or  associated skin lesions. No contractures.  Skin & Extremity Inspection: Skin color, temperature, and hair growth are WNL. No peripheral edema or cyanosis. No masses, redness, swelling, asymmetry, or associated skin lesions. No contractures.  Functional ROM: Unrestricted ROM          Functional ROM: Unrestricted ROM          Muscle Tone/Strength: Functionally intact. No obvious neuro-muscular anomalies detected.  Muscle Tone/Strength: Functionally intact. No obvious neuro-muscular anomalies detected.  Sensory (Neurological): Unimpaired  Sensory (Neurological): Unimpaired  Palpation: No palpable anomalies  Palpation: No palpable anomalies   Assessment  Primary Diagnosis & Pertinent Problem List: The primary encounter diagnosis was Chronic low back pain  (Location of Primary Pain) (Bilateral) (L>R). Diagnoses of Chronic lower extremity pain (Location of Tertiary source of pain) (Bilateral) (L>R), Chronic pain syndrome, Neurogenic pain, and Sleep apnea, unspecified type were also pertinent to this visit.  Status Diagnosis  Controlled Controlled Controlled 1. Chronic low back pain  (Location of Primary Pain) (Bilateral) (L>R)   2. Chronic lower extremity pain (Location of Tertiary source of pain) (Bilateral) (L>R)   3. Chronic pain syndrome   4. Neurogenic pain   5. Sleep apnea, unspecified type     Problems updated and reviewed during this visit: No problems updated. Plan of Care  Pharmacotherapy (Medications Ordered): Meds ordered this encounter  Medications  . oxyCODONE (OXY IR/ROXICODONE) 5 MG immediate release  tablet    Sig: Take 1 tablet (5 mg total) by mouth every 8 (eight) hours as needed for severe pain.    Dispense:  90 tablet    Refill:  0    Do not add this medication to the electronic "Automatic Refill" notification system. Patient may have prescription filled one day early if pharmacy is closed on scheduled refill date. Do not fill until: 07/02/2017 To last until:08/01/2017     Order Specific Question:   Supervising Provider    Answer:   Milinda Pointer [phone]  . oxyCODONE (OXY IR/ROXICODONE) 5 MG immediate release tablet    Sig: Take 1 tablet (5 mg total) by mouth every 8 (eight) hours as needed for severe pain.    Dispense:  90 tablet    Refill:  0    Do not add this medication to the electronic "Automatic Refill" notification system. Patient may have prescription filled one day early if pharmacy is closed on scheduled refill date. Do not fill until: 08/01/2017 To last until: 08/31/2017    Order Specific Question:   Supervising Provider    Answer:   Milinda Pointer [phone]  . oxyCODONE (OXY IR/ROXICODONE) 5 MG immediate release tablet    Sig: Take 1 tablet (5 mg total) by mouth every 8 (eight) hours as needed for severe pain.    Dispense:  90 tablet    Refill:  0    Do not add this medication to the electronic "Automatic Refill" notification system. Patient may have prescription filled one day early if pharmacy is closed on scheduled refill date. Do not fill until: 08/31/2017 To last until: 09/30/2017    Order Specific Question:   Supervising Provider    Answer:   Milinda Pointer [phone]  . pregabalin (LYRICA) 25 MG capsule    Sig: Take 1 capsule (25 mg total) by mouth 3 (three) times daily.    Dispense:  90 capsule    Refill:  0    Do not place this medication, or any other prescription from our practice, on "Automatic Refill". Patient may have prescription filled one day early if pharmacy is closed on scheduled refill date.    Order Specific Question:   Supervising Provider    Answer:   Milinda Pointer [phone]   New Prescriptions   PREGABALIN (LYRICA) 25 MG CAPSULE    Take 1 capsule (25 mg total) by mouth 3 (three) times daily.   Medications administered today: Mr. Bethea had no medications administered during this visit. Lab-work, procedure(s), and/or referral(s): Orders Placed This Encounter  Procedures  . Ambulatory referral to  Sleep Studies   Imaging and/or referral(s): AMB REFERRAL TO SLEEP STUDIES  Interventional therapies: Planned, scheduled, and/or pending:  Not at this time.    Considering:  Palliative Left cervical epidural steroid injection  Possible bilateral sacroiliac joint + lumbar facet RFA Diagnostic Bilateral HipBlock Diagnostic bilateral femoral nerve and obturator nerve block Possible bilateral femoral nerve and obturator nerve RFA Diagnostic bilateral cervical facet block. Possible bilateral cervical facet RFA. Diagnostic Caudal ESI + diagnostic epidurogram Diagnostic Right Knee injection. Possible series of 5 right-sided intra-articular [place] injections Diagnostic right Genicular NB Possible right Genicular Nerve RFA. Diagnostic bilateral intra-articular shoulderinjection Diagnostic bilateral suprascapular NB. Possible bilateral suprascapular Nerve RFA.   Palliative PRN treatment(s):  Diagnostic Bilateral Lumbar Facet & Bilateral S-I joint block Diagnostic Bilateral Hip Block Diagnostic bilateral cervical facetblock. Diagnostic Caudal ESI. Diagnostic Left CESI. Diagnostic Right Knee injection. Diagnostic right Genicular NB Diagnostic bilateral intra-articular shoulderinj. Diagnostic  bilateral suprascapular NB.   Provider-requested follow-up: Return in about 3 months (around 09/16/2017) for MedMgmt.  Future Appointments Date Time Provider [place]  09/15/2017 9:00 AM Vevelyn Francois, NP [facility] None   Primary Care Physician: Sofie Hartigan, MD Location: [facility] Note by: Vevelyn Francois NP Date: 06/16/2017; Time: 1:54 PM  Pain Score Disclaimer: We use the NRS-11 scale. This is a self-reported, subjective measurement of pain severity with only modest accuracy. It is used primarily to identify changes within a particular patient. It must be understood that outpatient pain scales are significantly less accurate that  those used for research, where they can be applied under ideal controlled circumstances with minimal exposure to variables. In reality, the score is likely to be a combination of pain intensity and pain affect, where pain affect describes the degree of emotional arousal or changes in action readiness caused by the sensory experience of pain. Factors such as social and work situation, setting, emotional state, anxiety levels, expectation, and prior pain experience may influence pain perception and show large inter-individual differences that may also be affected by time variables.  Patient instructions provided during this appointment: Patient Instructions    ____________________________________________________________________________________________  Medication Rules  Applies to: All patients receiving prescriptions (written or electronic).  Pharmacy of record: Pharmacy where electronic prescriptions will be sent. If written prescriptions are taken to a different pharmacy, please inform the nursing staff. The pharmacy listed in the electronic medical record should be the one where you would like electronic prescriptions to be sent.  Prescription refills: Only during scheduled appointments. Applies to both, written and electronic prescriptions.  NOTE: The following applies primarily to controlled substances (Opioid* Pain Medications).   Patient's responsibilities: 1. Pain Pills: Bring all pain pills to every appointment (except for procedure appointments). 2. Pill Bottles: Bring pills in original pharmacy bottle. Always bring newest bottle. Bring bottle, even if empty. 3. Medication refills: You are responsible for knowing and keeping track of what medications you need refilled. The day before your appointment, write a list of all prescriptions that need to be refilled. Bring that list to your appointment and give it to the admitting nurse. Prescriptions will be written only during appointments.  If you forget a medication, it will not be "Called in", "Faxed", or "electronically sent". You will need to get another appointment to get these prescribed. 4. Prescription Accuracy: You are responsible for carefully inspecting your prescriptions before leaving our office. Have the discharge nurse carefully go over each prescription with you, before taking them home. Make sure that your name is accurately spelled, that your address is correct. Check the name and dose of your medication to make sure it is accurate. Check the number of pills, and the written instructions to make sure they are clear and accurate. Make sure that you are given enough medication to last until your next medication refill appointment. 5. Taking Medication: Take medication as prescribed. Never take more pills than instructed. Never take medication more frequently than prescribed. Taking less pills or less frequently is permitted and encouraged, when it comes to controlled substances (written prescriptions).  6. Inform other Doctors: Always inform, all of your healthcare providers, of all the medications you take. 7. Pain Medication from other Providers: You are not allowed to accept any additional pain medication from any other Doctor or Healthcare provider. There are two exceptions to this rule. (see below) In the event that you require additional pain medication, you are responsible  for notifying us, as stated below. 8. Medication Agreement: You are responsible for carefully reading and following our Medication Agreement. This must be signed before receiving any prescriptions from our practice. Safely store a copy of your signed Agreement. Violations to the Agreement will result in no further prescriptions. (Additional copies of our Medication Agreement are available upon request.) 9. Laws, Rules, & Regulations: All patients are expected to follow all Federal and [employer], [employer], Rules, [employer]. Ignorance of the Laws does  not constitute a valid excuse. The use of any illegal substances is prohibited. 10. Adopted CDC guidelines & recommendations: Target dosing levels will be at or below 60 MME/day. Use of benzodiazepines** is not recommended.  Exceptions: There are only two exceptions to the rule of not receiving pain medications from other Healthcare Providers. 1. Exception #1 (Emergencies): In the event of an emergency (i.e.: accident requiring emergency care), you are allowed to receive additional pain medication. However, you are responsible for: As soon as you are able, call our office [phone], at any time of the day or night, and leave a message stating your name, the date and nature of the emergency, and the name and dose of the medication prescribed. In the event that your call is answered by a member of our staff, make sure to document and save the date, time, and the name of the person that took your information.  2. Exception #2 (Planned Surgery): In the event that you are scheduled by another doctor or dentist to have any type of surgery or procedure, you are allowed (for a period no longer than 30 days), to receive additional pain medication, for the acute post-op pain. However, in this case, you are responsible for picking up a copy of our "Post-op Pain Management for Surgeons" handout, and giving it to your surgeon or dentist. This document is available at our office, and does not require an appointment to obtain it. Simply go to our office during business hours (Monday-Thursday from 8:00 AM to 4:00 PM) (Friday 8:00 AM to 12:00 Noon) or if you have a scheduled appointment with [place], prior to your surgery, and ask for it by name. In addition, you will need to provide [place] with your name, name of your surgeon, type of surgery, and date of procedure or surgery.  *Opioid medications include: morphine, codeine, oxycodone, oxymorphone, hydrocodone, hydromorphone, meperidine, tramadol, tapentadol, buprenorphine,  fentanyl, methadone. **Benzodiazepine medications include: diazepam (Valium), alprazolam (Xanax), clonazepam (Klonopine), lorazepam (Ativan), clorazepate (Tranxene), chlordiazepoxide (Librium), estazolam (Prosom), oxazepam (Serax), temazepam (Restoril), triazolam (Halcion)  ____________________________________________________________________________________________  BMI Assessment: Estimated body mass index is 27.32 kg/m as calculated from the following:   Height as of 05/09/17: 5' 9"  (1.753 m).   Weight as of 05/09/17: 185 lb (83.9 kg).  BMI interpretation table: BMI level Category Range association with higher incidence of chronic pain  <18 kg/m2 Underweight   18.5-24.9 kg/m2 Ideal body weight   25-29.9 kg/m2 Overweight Increased incidence by 20%  30-34.9 kg/m2 Obese (Class I) Increased incidence by 68%  35-39.9 kg/m2 Severe obesity (Class II) Increased incidence by 136%  >40 kg/m2 Extreme obesity (Class III) Increased incidence by 254%   BMI Readings from Last 4 Encounters:  05/09/17 27.32 kg/m  03/21/17 27.32 kg/m  03/15/17 27.32 kg/m  01/27/17 27.32 kg/m   Wt Readings from Last 4 Encounters:  05/09/17 185 lb (83.9 kg)  03/21/17 185 lb (83.9 kg)  03/15/17 185 lb (83.9 kg)  01/27/17 185 lb (83.9 kg)

## 2017-08-04 ENCOUNTER — Other Ambulatory Visit: Payer: Self-pay | Admitting: Pain Medicine

## 2017-08-04 DIAGNOSIS — E559 Vitamin D deficiency, unspecified: Secondary | ICD-10-CM

## 2017-08-09 ENCOUNTER — Telehealth: Payer: Self-pay | Admitting: Pain Medicine

## 2017-08-09 NOTE — Telephone Encounter (Signed)
Pharmacy called and made aware of no phone refills- a medications agreement. States she will let patient know.

## 2017-08-09 NOTE — Telephone Encounter (Signed)
Pharmacy calling about refill for Vitamin D, please call to verify

## 2017-08-11 ENCOUNTER — Other Ambulatory Visit: Payer: Self-pay | Admitting: Family Medicine

## 2017-08-11 DIAGNOSIS — R1011 Right upper quadrant pain: Secondary | ICD-10-CM

## 2017-08-15 ENCOUNTER — Other Ambulatory Visit: Payer: Self-pay | Admitting: Family Medicine

## 2017-08-15 DIAGNOSIS — R1010 Upper abdominal pain, unspecified: Secondary | ICD-10-CM

## 2017-09-02 ENCOUNTER — Telehealth: Payer: Self-pay | Admitting: Family Medicine

## 2017-09-07 ENCOUNTER — Ambulatory Visit
Admission: RE | Admit: 2017-09-07 | Discharge: 2017-09-07 | Disposition: A | Discharge status: Home / Self Care | Payer: Medicare Other | Source: Ambulatory Visit | Attending: Family Medicine | Admitting: Family Medicine

## 2017-09-07 ENCOUNTER — Ambulatory Visit: Payer: Medicare Other

## 2017-09-07 DIAGNOSIS — K5730 Diverticulosis of large intestine without perforation or abscess without bleeding: Secondary | ICD-10-CM | POA: Insufficient documentation

## 2017-09-07 DIAGNOSIS — R1010 Upper abdominal pain, unspecified: Secondary | ICD-10-CM | POA: Insufficient documentation

## 2017-09-07 MED ORDER — IOPAMIDOL (ISOVUE-370) INJECTION 76%
75.0000 mL | Freq: Once | INTRAVENOUS | Status: AC | PRN
  Administered 2017-09-07: 75 mL via INTRAVENOUS

## 2017-09-15 ENCOUNTER — Ambulatory Visit: Payer: Medicare Other | Attending: Nurse Practitioner | Admitting: Nurse Practitioner

## 2017-09-15 ENCOUNTER — Encounter: Payer: Self-pay | Admitting: Nurse Practitioner

## 2017-09-15 ENCOUNTER — Other Ambulatory Visit: Payer: Self-pay

## 2017-09-15 VITALS — BP 128/89 | HR 93 | Temp 97.8°F | Resp 18 | Ht 69.0 in | Wt 185.0 lb

## 2017-09-15 DIAGNOSIS — M5416 Radiculopathy, lumbar region: Secondary | ICD-10-CM

## 2017-09-15 DIAGNOSIS — M4802 Spinal stenosis, cervical region: Secondary | ICD-10-CM | POA: Insufficient documentation

## 2017-09-15 DIAGNOSIS — G894 Chronic pain syndrome: Secondary | ICD-10-CM | POA: Diagnosis not present

## 2017-09-15 DIAGNOSIS — I429 Cardiomyopathy, unspecified: Secondary | ICD-10-CM | POA: Diagnosis not present

## 2017-09-15 DIAGNOSIS — G8929 Other chronic pain: Secondary | ICD-10-CM

## 2017-09-15 DIAGNOSIS — Z7982 Long term (current) use of aspirin: Secondary | ICD-10-CM | POA: Diagnosis not present

## 2017-09-15 DIAGNOSIS — M25559 Pain in unspecified hip: Secondary | ICD-10-CM

## 2017-09-15 DIAGNOSIS — Z79891 Long term (current) use of opiate analgesic: Secondary | ICD-10-CM | POA: Diagnosis not present

## 2017-09-15 DIAGNOSIS — Z9889 Other specified postprocedural states: Secondary | ICD-10-CM | POA: Diagnosis not present

## 2017-09-15 DIAGNOSIS — Z5181 Encounter for therapeutic drug level monitoring: Secondary | ICD-10-CM | POA: Diagnosis not present

## 2017-09-15 DIAGNOSIS — Z79899 Other long term (current) drug therapy: Secondary | ICD-10-CM | POA: Diagnosis not present

## 2017-09-15 DIAGNOSIS — M549 Dorsalgia, unspecified: Secondary | ICD-10-CM | POA: Insufficient documentation

## 2017-09-15 DIAGNOSIS — Z801 Family history of malignant neoplasm of trachea, bronchus and lung: Secondary | ICD-10-CM | POA: Diagnosis not present

## 2017-09-15 DIAGNOSIS — Z8546 Personal history of malignant neoplasm of prostate: Secondary | ICD-10-CM | POA: Insufficient documentation

## 2017-09-15 DIAGNOSIS — M792 Neuralgia and neuritis, unspecified: Secondary | ICD-10-CM

## 2017-09-15 DIAGNOSIS — Z808 Family history of malignant neoplasm of other organs or systems: Secondary | ICD-10-CM | POA: Diagnosis not present

## 2017-09-15 DIAGNOSIS — G4730 Sleep apnea, unspecified: Secondary | ICD-10-CM | POA: Diagnosis not present

## 2017-09-15 DIAGNOSIS — K219 Gastro-esophageal reflux disease without esophagitis: Secondary | ICD-10-CM | POA: Diagnosis not present

## 2017-09-15 MED ORDER — GABAPENTIN 100 MG PO CAPS: 100.0000 mg | ORAL_CAPSULE | Freq: Four times a day (QID) | ORAL | 0 refills | Status: DC

## 2017-09-15 MED ORDER — OXYCODONE HCL 5 MG PO TABS: 5.0000 mg | ORAL_TABLET | Freq: Three times a day (TID) | ORAL | 0 refills | Status: DC | PRN

## 2017-09-15 NOTE — Progress Notes (Signed)
Patient's Name: KAISYN MILLEA  MRN: [id number]  Referring Provider: Sofie Hartigan, MD  DOB: 1951-01-25  PCP: Sofie Hartigan, MD  DOS: 09/15/2017  Note by: Vevelyn Francois NP  Service setting: Ambulatory outpatient  Specialty: Interventional Pain Management  Location: [facility] (AMB) Pain Management Facility    Patient type: Established    Primary Reason(s) for Visit: Encounter for prescription drug management. (Level of risk: moderate)  CC: Back Pain  HPI  Mr. Mahar is a 67 y.o. year old, male patient, who comes today for a medication management evaluation. He has Hand weakness; Polyradiculopathy; Long term current use of opiate analgesic; Long term prescription opiate use; Opiate use (45 MME/Day); Opiate dependence ([place]); Encounter for therapeutic drug level monitoring; Chronic low back pain  (Location of Primary Pain) (Bilateral) (L>R); Chronic lower extremity pain (Location of Tertiary source of pain) (Bilateral) (L>R); Chronic lumbar radicular pain (Bilateral) (L>R) (Bilateral S1 dermatomal distribution); Chronic hip pain (Location of Secondary source of pain) (Bilateral) (L>R); Chronic upper back pain (Bilateral) (L>R); Chronic neck pain (neck<shoulder) (L>R); Chronic foot pain; Chronic knee pain (Right); Cervical spondylosis; 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); Chronic upper extremity pain (Bilateral) (L>R); Lumbar spondylosis; Cervical facet hypertrophy; Cervical facet syndrome (R>L); History of prostate cancer; Radicular pain of shoulder; Class I Morbid obesity (HCC) (68% higher incidence of chronic low back problems); Peripheral neuropathy (HCC) (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); Lumbar facet syndrome (Bilateral) (L>R); Sleep apnea; Depression; High cholesterol; Low testosterone; History of surgical procedure; Opioid-induced constipation (OIC); Chronic shoulder pain (Bilateral) (L>R); Breathing orally; Chest pain; Primary cardiomyopathy ([place]);  Chronic sacroiliac joint pain (Bilateral) (L>R); Musculoskeletal pain; Pain of right foot; Neurogenic pain; Chest pain with high risk for cardiac etiology; Chronic pain syndrome; Bilateral arm pain; Acute postoperative pain; and Vitamin D insufficiency on their problem list. His primarily concern today is the Back Pain  Pain Assessment: Location: Lower Back Radiating: radiates into both hips Onset: More than a month ago Duration: Chronic pain Quality: Throbbing, Shooting Severity: 5 /10 (self-reported pain score)  Note: Reported level is compatible with observation.                          Effect on ADL:   Timing: Constant Modifying factors: meds  Mr. Noblet was last scheduled for an appointment on 06/16/2017 for medication management. During today's appointment we reviewed Mr. Kuster chronic pain status, as well as his outpatient medication regimen. He states that some days are worse than others. He states that he is hurting up in his robs. He has had a CTscan of chest which was negative. He is also having epigastric pain. He is scheduled to have an EGD. He states that he has not done any thing differently. He admits that the pain is in his middle back. He is concern that they are related. He has not had any recent images of thoracic or lumbar spine.   The patient  reports that he does not use drugs. His body mass index is 27.32 kg/m.  Further details on both, my assessment(s), as well as the proposed treatment plan, please see below.  Controlled Substance Pharmacotherapy Assessment REMS (Risk Evaluation and Mitigation Strategy)  Analgesic:Oxycodone IR 82m every 8 hours (137mday) MME/day:22.37m24may.    TicDewayne ShorterN  09/15/2017  9:41 AM  Signed Nursing Pain Medication Assessment:  Safety precautions to be maintained throughout the outpatient stay will include: orient to surroundings, keep bed  in low position, maintain call bell within reach at all times, provide assistance  with transfer out of bed and ambulation.  Medication Inspection Compliance: Pill count conducted under aseptic conditions, in front of the patient. Neither the pills nor the bottle was removed from the patient's sight at any time. Once count was completed pills were immediately returned to the patient in their original bottle.  Medication: See above Pill/Patch Count: 23 of 90 pills remain Pill/Patch Appearance: Markings consistent with prescribed medication Bottle Appearance: Standard pharmacy container. Clearly labeled. Filled Date: 27 / 03/ 2018 Last Medication intake:  Today   Pharmacokinetics: Liberation and absorption (onset of action): WNL Distribution (time to peak effect): WNL Metabolism and excretion (duration of action): WNL         Pharmacodynamics: Desired effects: Analgesia: Mr. Mastrangelo reports >50% benefit. Functional ability: Patient reports that medication allows him to accomplish basic ADLs Clinically meaningful improvement in function (CMIF): Sustained CMIF goals met Perceived effectiveness: Described as relatively effective, allowing for increase in activities of daily living (ADL) Undesirable effects: Side-effects or Adverse reactions: None reported Monitoring: [place] PMP: Online review of the past 88-monthperiod conducted. Compliant with practice rules and regulations Last UDS on record: Summary  Date Value Ref Range Status  03/15/2017 FINAL  Final    Comment:    ==================================================================== TOXASSURE SELECT 13 (MW) ==================================================================== Test                             Result       Flag       Units Drug Present and Declared for Prescription Verification   Oxycodone                      610          EXPECTED   ng/mg creat   Oxymorphone                    364          EXPECTED   ng/mg creat   Noroxycodone                   821          EXPECTED   ng/mg creat   Noroxymorphone                  214          EXPECTED   ng/mg creat    Sources of oxycodone are scheduled prescription medications.    Oxymorphone, noroxycodone, and noroxymorphone are expected    metabolites of oxycodone. Oxymorphone is also available as a    scheduled prescription medication. ==================================================================== Test                      Result    Flag   Units      Ref Range   Creatinine              325              mg/dL      >=20 ==================================================================== Declared Medications:  The flagging and interpretation on this report are based on the  following declared medications.  Unexpected results may arise from  inaccuracies in the declared medications.  **Note: The testing scope of this panel includes these medications:  Oxycodone (Roxicodone)  **Note: The testing scope of this panel does not include following  reported medications:  Aspirin (Aspirin 81)  Cyclobenzaprine (Flexeril)  Pantoprazole (Protonix)  Sertraline (Zoloft)  Simvastatin (Zocor)  Testosterone  Venlafaxine (Effexor) ==================================================================== For clinical consultation, please call [phone]. ====================================================================    UDS interpretation: Compliant          Medication Assessment Form: Reviewed. Patient indicates being compliant with therapy Treatment compliance: Compliant Risk Assessment Profile: Aberrant behavior: See prior evaluations. None observed or detected today Comorbid factors increasing risk of overdose: See prior notes. No additional risks detected today Risk of substance use disorder (SUD): Low Opioid Risk Tool - 09/15/17 0938      Family History of Substance Abuse   Alcohol  Negative    Illegal Drugs  Negative    Rx Drugs  Negative      Personal History of Substance Abuse   Alcohol  Negative    Illegal Drugs  Negative    Rx  Drugs  Negative      Age   Age between 65-45 years   No      History of Preadolescent Sexual Abuse   History of Preadolescent Sexual Abuse  Negative or Male      Psychological Disease   Psychological Disease  Negative    Depression  Negative      Total Score   Opioid Risk Tool Scoring  0    Opioid Risk Interpretation  Low Risk      ORT Scoring interpretation table:  Score <3 = Low Risk for SUD  Score between 4-7 = Moderate Risk for SUD  Score >8 = High Risk for Opioid Abuse   Risk Mitigation Strategies:  Patient Counseling: Covered Patient-Prescriber Agreement (PPA): Present and active  Notification to other healthcare providers: Done  Pharmacologic Plan: No change in therapy, at this time.             Laboratory Chemistry  Inflammation Markers (CRP: Acute Phase) (ESR: Chronic Phase) Lab Results  Component Value Date   CRP <0.5 08/21/2015   ESRSEDRATE 4 08/21/2015                 Rheumatology Markers No results found for: RF, ANA, Therisa Doyne, [facility]              Renal Function Markers Lab Results  Component Value Date   BUN 13 11/25/2015   CREATININE 0.96 11/25/2015   GFRAA >60 11/25/2015   GFRNONAA >60 11/25/2015                 Hepatic Function Markers Lab Results  Component Value Date   AST 31 11/25/2015   ALT 41 11/25/2015   ALBUMIN 3.9 11/25/2015   ALKPHOS 71 11/25/2015                 Electrolytes Lab Results  Component Value Date   NA 138 11/25/2015   K 4.2 11/25/2015   CL 104 11/25/2015   CALCIUM 9.0 11/25/2015   MG 2.1 08/21/2015                 Neuropathy Markers No results found for: VITAMINB12, FOLATE, HGBA1C, HIV               Bone Pathology Markers No results found for: VD25OH, VD125OH2TOT, [id number], ZH0865HQ4, 25OHVITD1, 25OHVITD2, 25OHVITD3, TESTOFREE, TESTOSTERONE               Coagulation Parameters Lab Results  Component Value Date   PLT 238 11/25/2015  Cardiovascular  Markers Lab Results  Component Value Date   HGB 14.6 11/25/2015   HCT 43.5 11/25/2015                 CA Markers No results found for: CEA, CA125, LABCA2               Note: Lab results reviewed.  Recent Diagnostic Imaging Results  CT ABDOMEN PELVIS W CONTRAST CLINICAL DATA:  Abdominal pain for approximately 1 month. History of prostate carcinoma  EXAM: CT ABDOMEN AND PELVIS WITH CONTRAST  TECHNIQUE: Multidetector CT imaging of the abdomen and pelvis was performed using the standard protocol following bolus administration of intravenous contrast. Oral contrast was also administered.  CONTRAST:  11m ISOVUE-370 IOPAMIDOL (ISOVUE-370) INJECTION 76%  COMPARISON:  September 05, 2015  FINDINGS: Lower chest: Lung bases are clear.  Hepatobiliary: No focal liver lesions are apparent. Gallbladder is absent. There is no biliary duct dilatation.  Pancreas: There are areas of fatty infiltration within the pancreas. There is no pancreatic mass or inflammatory focus.  Spleen: No splenic lesions are evident.  Adrenals/Urinary Tract: Adrenals bilaterally appear normal. There is a parapelvic cyst in the right kidney measuring 1.5 x 1.5 cm. There are scattered subcentimeter cysts in each kidney as well. There is no appreciable hydronephrosis on either side. There is no evident renal or ureteral calculus on either side. Urinary bladder is midline with wall thickness within normal limits.  Stomach/Bowel: There are multiple sigmoid diverticula without diverticulitis. There is felt to be a degree of muscular hypertrophy in the sigmoid colon from chronic diverticulosis. Elsewhere there is no bowel wall thickening. No mesenteric thickening. No evident bowel obstruction. No free air or portal venous air.  Vascular/Lymphatic: There is no abdominal aortic aneurysm. No vascular lesions are evident on this study. Major mesenteric vessels appear widely patent. No adenopathy is appreciable in  the abdomen or pelvis.  Reproductive: There are seed implants in the prostate. Prostate and seminal vesicles appear normal in size and contour. There is no evident pelvic mass.  Other: Appendix appears normal. There is no ascites or abscess in the abdomen or pelvis. There is postoperative change along the wall of the anterior left pelvis with a left inguinal hernia repair. No appreciable hernia present currently.  Musculoskeletal: There are no blastic or lytic bone lesions. There is no intramuscular or abdominal wall lesion evident.  IMPRESSION: 1. Sigmoid diverticula. Mild wall thickening in the sigmoid colon is felt to be due to muscular hypertrophy from chronic diverticulosis. No frank diverticulitis evident.  2.  No bowel obstruction.  No abscess.  Appendix appears normal.  3. No appreciable renal or ureteral calculus. No hydronephrosis. No urinary bladder wall thickening evident.  4. Seed implants in prostate. Prostate and seminal vesicles normal in size and contour.  5.  Gallbladder absent.  Prior left inguinal hernia repair.  Electronically Signed   By: WLowella GripIII M.D.   On: 09/07/2017 12:01  Complexity Note: Imaging results reviewed. Results shared with Mr. LGarcon using Layman's terms.                         Meds   Current Outpatient Medications:  .  ANDROGEL PUMP 20.25 MG/ACT (1.62%) GEL, APPLY TO 2 PUMPS ONCE A DAY AS DIRECTED, Disp: , Rfl: 5 .  aspirin 81 MG tablet, Take 81 mg by mouth daily., Disp: , Rfl:  .  [START ON 12/14/2017] oxyCODONE (OXY IR/ROXICODONE) 5 MG immediate  release tablet, Take 1 tablet (5 mg total) by mouth every 8 (eight) hours as needed for severe pain., Disp: 90 tablet, Rfl: 0 .  pantoprazole (PROTONIX) 40 MG tablet, Take 40 mg by mouth daily., Disp: , Rfl:  .  simvastatin (ZOCOR) 10 MG tablet, Take 10 mg by mouth at bedtime. , Disp: , Rfl:  .  Cholecalciferol 5000 units capsule, Take 1 capsule (5,000 Units total) by mouth  daily., Disp: 90 capsule, Rfl: 0 .  gabapentin (NEURONTIN) 100 MG capsule, Take 1-3 capsules (100-300 mg total) by mouth 4 (four) times daily. Follow written titration schedule., Disp: 360 capsule, Rfl: 0 .  [START ON 11/14/2017] oxyCODONE (OXY IR/ROXICODONE) 5 MG immediate release tablet, Take 1 tablet (5 mg total) by mouth every 8 (eight) hours as needed for severe pain., Disp: 90 tablet, Rfl: 0 .  [START ON 10/15/2017] oxyCODONE (OXY IR/ROXICODONE) 5 MG immediate release tablet, Take 1 tablet (5 mg total) by mouth every 8 (eight) hours as needed for severe pain., Disp: 90 tablet, Rfl: 0  ROS  Constitutional: Denies any fever or chills Gastrointestinal: No reported hemesis, hematochezia, vomiting, or acute GI distress Musculoskeletal: Denies any acute onset joint swelling, redness, loss of ROM, or weakness Neurological: No reported episodes of acute onset apraxia, aphasia, dysarthria, agnosia, amnesia, paralysis, loss of coordination, or loss of consciousness  Allergies  Mr. Lebleu has No Known Allergies.  PFSH  Drug: Mr. Carmickle  reports that he does not use drugs. Alcohol:  reports that he does not drink alcohol. Tobacco:  reports that  has never smoked. he has never used smokeless tobacco. Medical:  has a past medical history of Bilateral arm pain, Cardiomyopathy ([place]), Diverticulosis, Dupuytren's contracture, Foot pain, bilateral, GERD (gastroesophageal reflux disease), Hand weakness, Hyperlipidemia, Knee pain, Midline low back pain, Osteoarthritis, Pain in neck, Prostate cancer ([place]), Reactive airway disease, Seasonal allergies, Situational anxiety, and Sleep apnea. Surgical: Mr. Dollinger  has a past surgical history that includes Nose surgery; Hand surgery (Bilateral); Colonoscopy (10/24/2012); Upper gi endoscopy (12/18/2012); Tonsillectomy (2009); Skin cancer excision (Left, 2015); Mass excision (Right, 07/18/2015); Esophagogastroduodenoscopy (egd) with propofol (N/A, 10/28/2015); prostate  seeding; Hernia repair; and Cholecystectomy (N/A, 12/02/2015). Family: family history includes Colon cancer in his brother; Hyperlipidemia in his sister; Lung cancer in his father; Prostate cancer in his brother.  Constitutional Exam  General appearance: Well nourished, well developed, and well hydrated. In no apparent acute distress Vitals:   09/15/17 0933  BP: 128/89  Pulse: 93  Resp: 18  Temp: 97.8 F (36.6 C)  SpO2: 98%  Weight: 185 lb (83.9 kg)  Height: 5' 9"  (1.753 m)   BMI Assessment: Estimated body mass index is 27.32 kg/m as calculated from the following:   Height as of this encounter: 5' 9"  (1.753 m).   Weight as of this encounter: 185 lb (83.9 kg). Psych/Mental status: Alert, oriented x 3 (person, place, & time)       Eyes: PERLA Respiratory: No evidence of acute respiratory distress  Cervical Spine Area Exam  Skin & Axial Inspection: No masses, redness, edema, swelling, or associated skin lesions Alignment: Symmetrical Functional ROM: Unrestricted ROM      Stability: No instability detected Muscle Tone/Strength: Functionally intact. No obvious neuro-muscular anomalies detected. Sensory (Neurological): Unimpaired Palpation: No palpable anomalies              Upper Extremity (UE) Exam    Side: Right upper extremity  Side: Left upper extremity  Skin & Extremity Inspection: Skin color, temperature,  and hair growth are WNL. No peripheral edema or cyanosis. No masses, redness, swelling, asymmetry, or associated skin lesions. No contractures.  Skin & Extremity Inspection: Skin color, temperature, and hair growth are WNL. No peripheral edema or cyanosis. No masses, redness, swelling, asymmetry, or associated skin lesions. No contractures.  Functional ROM: Unrestricted ROM          Functional ROM: Unrestricted ROM          Muscle Tone/Strength: Functionally intact. No obvious neuro-muscular anomalies detected.  Muscle Tone/Strength: Functionally intact. No obvious  neuro-muscular anomalies detected.  Sensory (Neurological): Unimpaired          Sensory (Neurological): Unimpaired          Palpation: No palpable anomalies              Palpation: No palpable anomalies              Specialized Test(s): Deferred         Specialized Test(s): Deferred          Thoracic Spine Area Exam  Skin & Axial Inspection: No masses, redness, or swelling Alignment: Symmetrical Functional ROM: Unrestricted ROM Stability: No instability detected Muscle Tone/Strength: Functionally intact. No obvious neuro-muscular anomalies detected. Sensory (Neurological): Unimpaired Muscle strength & Tone: Tender  Lumbar Spine Area Exam  Skin & Axial Inspection: No masses, redness, or swelling Alignment: Symmetrical Functional ROM: Unrestricted ROM      Stability: No instability detected Muscle Tone/Strength: Functionally intact. No obvious neuro-muscular anomalies detected. Sensory (Neurological): Unimpaired Palpation: No palpable anomalies       Provocative Tests: Lumbar Hyperextension and rotation test: evaluation deferred today       Lumbar Lateral bending test: evaluation deferred today       Patrick's Maneuver: evaluation deferred today                    Gait & Posture Assessment  Ambulation: Unassisted Gait: Relatively normal for age and body habitus Posture: WNL   Lower Extremity Exam    Side: Right lower extremity  Side: Left lower extremity  Skin & Extremity Inspection: Skin color, temperature, and hair growth are WNL. No peripheral edema or cyanosis. No masses, redness, swelling, asymmetry, or associated skin lesions. No contractures.  Skin & Extremity Inspection: Skin color, temperature, and hair growth are WNL. No peripheral edema or cyanosis. No masses, redness, swelling, asymmetry, or associated skin lesions. No contractures.  Functional ROM: Unrestricted ROM          Functional ROM: Unrestricted ROM          Muscle Tone/Strength: Functionally intact. No obvious  neuro-muscular anomalies detected.  Muscle Tone/Strength: Functionally intact. No obvious neuro-muscular anomalies detected.  Sensory (Neurological): Unimpaired  Sensory (Neurological): Unimpaired  Palpation: No palpable anomalies  Palpation: No palpable anomalies   Assessment  Primary Diagnosis & Pertinent Problem List: The primary encounter diagnosis was Chronic upper back pain (Bilateral) (L>R). Diagnoses of Chronic lumbar radicular pain (Bilateral) (L>R) (Bilateral S1 dermatomal distribution), Chronic hip pain (Location of Secondary source of pain) (Bilateral) (L>R), Chronic pain syndrome, Neurogenic pain, and Long term current use of opiate analgesic were also pertinent to this visit.  Status Diagnosis  Controlled Controlled Controlled 1. Chronic upper back pain (Bilateral) (L>R)   2. Chronic lumbar radicular pain (Bilateral) (L>R) (Bilateral S1 dermatomal distribution)   3. Chronic hip pain (Location of Secondary source of pain) (Bilateral) (L>R)   4. Chronic pain syndrome   5. Neurogenic pain   6.  Long term current use of opiate analgesic     Problems updated and reviewed during this visit: No problems updated. Plan of Care  Pharmacotherapy (Medications Ordered): Meds ordered this encounter  Medications  . oxyCODONE (OXY IR/ROXICODONE) 5 MG immediate release tablet    Sig: Take 1 tablet (5 mg total) by mouth every 8 (eight) hours as needed for severe pain.    Dispense:  90 tablet    Refill:  0    Do not add this medication to the electronic "Automatic Refill" notification system. Patient may have prescription filled one day early if pharmacy is closed on scheduled refill date. Do not fill until: 12/14/2017 To last until: 01/13/2018    Order Specific Question:   Supervising Provider    Answer:   Milinda Pointer [phone]  . oxyCODONE (OXY IR/ROXICODONE) 5 MG immediate release tablet    Sig: Take 1 tablet (5 mg total) by mouth every 8 (eight) hours as needed for severe pain.     Dispense:  90 tablet    Refill:  0    Do not add this medication to the electronic "Automatic Refill" notification system. Patient may have prescription filled one day early if pharmacy is closed on scheduled refill date. Do not fill until: 11/14/2017 To last until:12/14/2017    Order Specific Question:   Supervising Provider    Answer:   Milinda Pointer [phone]  . oxyCODONE (OXY IR/ROXICODONE) 5 MG immediate release tablet    Sig: Take 1 tablet (5 mg total) by mouth every 8 (eight) hours as needed for severe pain.    Dispense:  90 tablet    Refill:  0    Do not add this medication to the electronic "Automatic Refill" notification system. Patient may have prescription filled one day early if pharmacy is closed on scheduled refill date. Do not fill until:10/15/2017 To last until: 11/14/2017    Order Specific Question:   Supervising Provider    Answer:   Milinda Pointer [phone]  . gabapentin (NEURONTIN) 100 MG capsule    Sig: Take 1-3 capsules (100-300 mg total) by mouth 4 (four) times daily. Follow written titration schedule.    Dispense:  360 capsule    Refill:  0    Do not place this medication, or any other prescription from our practice, on "Automatic Refill". Patient may have prescription filled one day early if pharmacy is closed on scheduled refill date.    Order Specific Question:   Supervising Provider    Answer:   Milinda Pointer [[id number]]  This SmartLink is deprecated. Use AVSMEDLIST instead to display the medication list for a patient. Medications administered today: Delane Ginger had no medications administered during this visit. Lab-work, procedure(s), and/or referral(s): Orders Placed This Encounter  Procedures  . ToxASSURE Select 13 (MW), Urine   Imaging and/or referral(s): None  Interventional therapies: Planned, scheduled, and/or pending:  Not at this time.    Considering:  Palliative Left cervical epidural steroid injection  Possible bilateral  sacroiliac joint + lumbar facet RFA Diagnostic Bilateral HipBlock Diagnostic bilateral femoral nerve and obturator nerve block Possible bilateral femoral nerve and obturator nerve RFA Diagnostic bilateral cervical facet block. Possible bilateral cervical facet RFA. Diagnostic Caudal ESI + diagnostic epidurogram Diagnostic Right Knee injection. Possible series of 5 right-sided intra-articular [place] injections Diagnostic right Genicular NB Possible right Genicular Nerve RFA. Diagnostic bilateral intra-articular shoulderinjection Diagnostic bilateral suprascapular NB. Possible bilateral suprascapular Nerve RFA.   Palliative PRN treatment(s):  Diagnostic Bilateral Lumbar Facet &  Bilateral S-I joint block Diagnostic Bilateral Hip Block Diagnostic bilateral cervical facetblock. Diagnostic Caudal ESI. Diagnostic Left CESI. Diagnostic Right Knee injection. Diagnostic right Genicular NB Diagnostic bilateral intra-articular shoulderinj. Diagnostic bilateral suprascapular NB    Provider-requested follow-up: Return in about 3 months (around 12/14/2017) for [facility]Mgmt with [facility]).  Future Appointments  Date Time Provider [place]  12/08/2017  9:45 AM Vevelyn Francois, NP [facility] None   Primary Care Physician: Sofie Hartigan, MD Location: [facility] Facility Note by: Vevelyn Francois NP Date: 09/15/2017; Time: 2:29 PM  Pain Score Disclaimer: We use the NRS-11 scale. This is a self-reported, subjective measurement of pain severity with only modest accuracy. It is used primarily to identify changes within a particular patient. It must be understood that outpatient pain scales are significantly less accurate that those used for research, where they can be applied under ideal controlled circumstances with minimal exposure to variables. In reality, the score is likely to be a combination of pain intensity and pain affect, where pain affect  describes the degree of emotional arousal or changes in action readiness caused by the sensory experience of pain. Factors such as social and work situation, setting, emotional state, anxiety levels, expectation, and prior pain experience may influence pain perception and show large inter-individual differences that may also be affected by time variables.  Patient instructions provided during this appointment: Patient Instructions   ____________________________________________________________________________________________  Medication Rules  Applies to: All patients receiving prescriptions (written or electronic).  Pharmacy of record: Pharmacy where electronic prescriptions will be sent. If written prescriptions are taken to a different pharmacy, please inform the nursing staff. The pharmacy listed in the electronic medical record should be the one where you would like electronic prescriptions to be sent.  Prescription refills: Only during scheduled appointments. Applies to both, written and electronic prescriptions.  NOTE: The following applies primarily to controlled substances (Opioid* Pain Medications).   Patient's responsibilities: 1. Pain Pills: Bring all pain pills to every appointment (except for procedure appointments). 2. Pill Bottles: Bring pills in original pharmacy bottle. Always bring newest bottle. Bring bottle, even if empty. 3. Medication refills: You are responsible for knowing and keeping track of what medications you need refilled. The day before your appointment, write a list of all prescriptions that need to be refilled. Bring that list to your appointment and give it to the admitting nurse. Prescriptions will be written only during appointments. If you forget a medication, it will not be "Called in", "Faxed", or "electronically sent". You will need to get another appointment to get these prescribed. 4. Prescription Accuracy: You are responsible for carefully inspecting  your prescriptions before leaving our office. Have the discharge nurse carefully go over each prescription with you, before taking them home. Make sure that your name is accurately spelled, that your address is correct. Check the name and dose of your medication to make sure it is accurate. Check the number of pills, and the written instructions to make sure they are clear and accurate. Make sure that you are given enough medication to last until your next medication refill appointment. 5. Taking Medication: Take medication as prescribed. Never take more pills than instructed. Never take medication more frequently than prescribed. Taking less pills or less frequently is permitted and encouraged, when it comes to controlled substances (written prescriptions).  6. Inform other Doctors: Always inform, all of your healthcare providers, of all the medications you take. 7. Pain Medication from other Providers: You  are not allowed to accept any additional pain medication from any other Doctor or Healthcare provider. There are two exceptions to this rule. (see below) In the event that you require additional pain medication, you are responsible for notifying us, as stated below. 8. Medication Agreement: You are responsible for carefully reading and following our Medication Agreement. This must be signed before receiving any prescriptions from our practice. Safely store a copy of your signed Agreement. Violations to the Agreement will result in no further prescriptions. (Additional copies of our Medication Agreement are available upon request.) 9. Laws, Rules, & Regulations: All patients are expected to follow all Federal and [employer], [employer], Rules, [employer]. Ignorance of the Laws does not constitute a valid excuse. The use of any illegal substances is prohibited. 10. Adopted CDC guidelines & recommendations: Target dosing levels will be at or below 60 MME/day. Use of benzodiazepines** is not  recommended.  Exceptions: There are only two exceptions to the rule of not receiving pain medications from other Healthcare Providers. 1. Exception #1 (Emergencies): In the event of an emergency (i.e.: accident requiring emergency care), you are allowed to receive additional pain medication. However, you are responsible for: As soon as you are able, call our office [phone], at any time of the day or night, and leave a message stating your name, the date and nature of the emergency, and the name and dose of the medication prescribed. In the event that your call is answered by a member of our staff, make sure to document and save the date, time, and the name of the person that took your information.  2. Exception #2 (Planned Surgery): In the event that you are scheduled by another doctor or dentist to have any type of surgery or procedure, you are allowed (for a period no longer than 30 days), to receive additional pain medication, for the acute post-op pain. However, in this case, you are responsible for picking up a copy of our "Post-op Pain Management for Surgeons" handout, and giving it to your surgeon or dentist. This document is available at our office, and does not require an appointment to obtain it. Simply go to our office during business hours (Monday-Thursday from 8:00 AM to 4:00 PM) (Friday 8:00 AM to 12:00 Noon) or if you have a scheduled appointment with [place], prior to your surgery, and ask for it by name. In addition, you will need to provide [place] with your name, name of your surgeon, type of surgery, and date of procedure or surgery.  *Opioid medications include: morphine, codeine, oxycodone, oxymorphone, hydrocodone, hydromorphone, meperidine, tramadol, tapentadol, buprenorphine, fentanyl, methadone. **Benzodiazepine medications include: diazepam (Valium), alprazolam (Xanax), clonazepam (Klonopine), lorazepam (Ativan), clorazepate (Tranxene), chlordiazepoxide (Librium), estazolam (Prosom),  oxazepam (Serax), temazepam (Restoril), triazolam (Halcion)  ____________________________________________________________________________________________

## 2017-09-15 NOTE — Progress Notes (Signed)
Nursing Pain Medication Assessment:  Safety precautions to be maintained throughout the outpatient stay will include: orient to surroundings, keep bed in low position, maintain call bell within reach at all times, provide assistance with transfer out of bed and ambulation.  Medication Inspection Compliance: Pill count conducted under aseptic conditions, in front of the patient. Neither the pills nor the bottle was removed from the patient's sight at any time. Once count was completed pills were immediately returned to the patient in their original bottle.  Medication: See above Pill/Patch Count: 23 of 90 pills remain Pill/Patch Appearance: Markings consistent with prescribed medication Bottle Appearance: Standard pharmacy container. Clearly labeled. Filled Date: 57 / 03/ 2018 Last Medication intake:  Today

## 2017-09-15 NOTE — Patient Instructions (Signed)

## 2017-09-16 ENCOUNTER — Telehealth: Payer: Self-pay

## 2017-09-16 NOTE — Telephone Encounter (Signed)
Patient called to say that he could not get his script filled because it had expired.  Called pharmacy in [place] and they state that the script was dated in July and that it had expired.  Patient was here in October and received 3 scripts.  Called patient to see where the scripts were.  He states that they had been given to the pharmacy in [place] to be held.  Notified him that he needed to go to the pharmacy and figure out where the script  To be filled in January was.  He states understanding.

## 2017-11-16 ENCOUNTER — Encounter: Payer: Self-pay | Admitting: *Deleted

## 2017-11-17 ENCOUNTER — Other Ambulatory Visit: Payer: Self-pay

## 2017-11-17 ENCOUNTER — Ambulatory Visit: Payer: Medicare Other | Admitting: Anesthesiology

## 2017-11-17 ENCOUNTER — Ambulatory Visit
Admission: RE | Admit: 2017-11-17 | Discharge: 2017-11-17 | Disposition: A | Discharge status: Home / Self Care | Payer: Medicare Other | Source: Ambulatory Visit | Attending: Gastroenterology | Admitting: Gastroenterology

## 2017-11-17 ENCOUNTER — Encounter
Admission: RE | Disposition: A | Discharge status: Home / Self Care | Payer: Self-pay | Source: Ambulatory Visit | Attending: Gastroenterology

## 2017-11-17 DIAGNOSIS — Z7982 Long term (current) use of aspirin: Secondary | ICD-10-CM | POA: Diagnosis not present

## 2017-11-17 DIAGNOSIS — Z800 Family history of malignant neoplasm of digestive organs: Secondary | ICD-10-CM | POA: Diagnosis present

## 2017-11-17 DIAGNOSIS — J45909 Unspecified asthma, uncomplicated: Secondary | ICD-10-CM | POA: Diagnosis not present

## 2017-11-17 DIAGNOSIS — G4730 Sleep apnea, unspecified: Secondary | ICD-10-CM | POA: Insufficient documentation

## 2017-11-17 DIAGNOSIS — K449 Diaphragmatic hernia without obstruction or gangrene: Secondary | ICD-10-CM | POA: Diagnosis not present

## 2017-11-17 DIAGNOSIS — D124 Benign neoplasm of descending colon: Secondary | ICD-10-CM | POA: Diagnosis not present

## 2017-11-17 DIAGNOSIS — F419 Anxiety disorder, unspecified: Secondary | ICD-10-CM | POA: Insufficient documentation

## 2017-11-17 DIAGNOSIS — K210 Gastro-esophageal reflux disease with esophagitis: Secondary | ICD-10-CM | POA: Insufficient documentation

## 2017-11-17 DIAGNOSIS — K5730 Diverticulosis of large intestine without perforation or abscess without bleeding: Secondary | ICD-10-CM | POA: Insufficient documentation

## 2017-11-17 DIAGNOSIS — K640 First degree hemorrhoids: Secondary | ICD-10-CM | POA: Insufficient documentation

## 2017-11-17 DIAGNOSIS — K3189 Other diseases of stomach and duodenum: Secondary | ICD-10-CM | POA: Insufficient documentation

## 2017-11-17 DIAGNOSIS — D123 Benign neoplasm of transverse colon: Secondary | ICD-10-CM | POA: Diagnosis not present

## 2017-11-17 DIAGNOSIS — Z8546 Personal history of malignant neoplasm of prostate: Secondary | ICD-10-CM | POA: Insufficient documentation

## 2017-11-17 DIAGNOSIS — Z79899 Other long term (current) drug therapy: Secondary | ICD-10-CM | POA: Diagnosis not present

## 2017-11-17 DIAGNOSIS — E785 Hyperlipidemia, unspecified: Secondary | ICD-10-CM | POA: Insufficient documentation

## 2017-11-17 DIAGNOSIS — K2950 Unspecified chronic gastritis without bleeding: Secondary | ICD-10-CM | POA: Diagnosis not present

## 2017-11-17 HISTORY — PX: COLONOSCOPY WITH PROPOFOL: SHX5780

## 2017-11-17 HISTORY — PX: ESOPHAGOGASTRODUODENOSCOPY: SHX5428

## 2017-11-17 HISTORY — DX: Other gastritis without bleeding: K29.60

## 2017-11-17 SURGERY — EGD (ESOPHAGOGASTRODUODENOSCOPY)
Anesthesia: General

## 2017-11-17 MED ORDER — BUTAMBEN-TETRACAINE-BENZOCAINE 2-2-14 % EX AERO
INHALATION_SPRAY | CUTANEOUS | Status: AC
  Filled 2017-11-17: qty 5

## 2017-11-17 MED ORDER — PROPOFOL 500 MG/50ML IV EMUL
INTRAVENOUS | Status: DC | PRN
  Administered 2017-11-17: 120 ug/kg/min via INTRAVENOUS

## 2017-11-17 MED ORDER — EPHEDRINE SULFATE 50 MG/ML IJ SOLN
INTRAMUSCULAR | Status: AC
  Filled 2017-11-17: qty 1

## 2017-11-17 MED ORDER — PHENYLEPHRINE HCL 10 MG/ML IJ SOLN
INTRAMUSCULAR | Status: DC | PRN
  Administered 2017-11-17 (×2): 50 ug via INTRAVENOUS

## 2017-11-17 MED ORDER — EPHEDRINE SULFATE 50 MG/ML IJ SOLN
INTRAMUSCULAR | Status: DC | PRN
  Administered 2017-11-17: 10 mg via INTRAVENOUS
  Administered 2017-11-17 (×4): 5 mg via INTRAVENOUS

## 2017-11-17 MED ORDER — PROPOFOL 10 MG/ML IV BOLUS
INTRAVENOUS | Status: AC
  Filled 2017-11-17: qty 20

## 2017-11-17 MED ORDER — MIDAZOLAM HCL 2 MG/2ML IJ SOLN
INTRAMUSCULAR | Status: AC
  Filled 2017-11-17: qty 2

## 2017-11-17 MED ORDER — PROPOFOL 500 MG/50ML IV EMUL
INTRAVENOUS | Status: AC
  Filled 2017-11-17: qty 50

## 2017-11-17 MED ORDER — PHENYLEPHRINE HCL 10 MG/ML IJ SOLN
INTRAMUSCULAR | Status: AC
  Filled 2017-11-17: qty 1

## 2017-11-17 MED ORDER — FENTANYL CITRATE (PF) 100 MCG/2ML IJ SOLN
INTRAMUSCULAR | Status: AC
  Filled 2017-11-17: qty 2

## 2017-11-17 MED ORDER — MIDAZOLAM HCL 2 MG/2ML IJ SOLN
INTRAMUSCULAR | Status: DC | PRN
  Administered 2017-11-17: 2 mg via INTRAVENOUS

## 2017-11-17 MED ORDER — LIDOCAINE HCL (CARDIAC) 20 MG/ML IV SOLN
INTRAVENOUS | Status: DC | PRN
  Administered 2017-11-17: 30 mg via INTRAVENOUS

## 2017-11-17 MED ORDER — FENTANYL CITRATE (PF) 100 MCG/2ML IJ SOLN
INTRAMUSCULAR | Status: DC | PRN
  Administered 2017-11-17: 50 ug via INTRAVENOUS

## 2017-11-17 MED ORDER — LIDOCAINE HCL (PF) 2 % IJ SOLN
INTRAMUSCULAR | Status: AC
  Filled 2017-11-17: qty 10

## 2017-11-17 MED ORDER — SODIUM CHLORIDE 0.9 % IV SOLN
INTRAVENOUS | Status: DC
  Administered 2017-11-17: 08:00:00 via INTRAVENOUS

## 2017-11-17 NOTE — Anesthesia Procedure Notes (Signed)
Performed by: Cook-Martin, Koreen Lizaola Pre-anesthesia Checklist: Patient identified, Emergency Drugs available, Suction available, Patient being monitored and Timeout performed Patient Re-evaluated:Patient Re-evaluated prior to induction Oxygen Delivery Method: Nasal cannula Preoxygenation: Pre-oxygenation with 100% oxygen Induction Type: IV induction Placement Confirmation: positive ETCO2 and CO2 detector       

## 2017-11-17 NOTE — Anesthesia Postprocedure Evaluation (Signed)
Anesthesia Post Note  Patient: GIFFORD BALLON  Procedure(s) Performed: ESOPHAGOGASTRODUODENOSCOPY (EGD) (N/A ) COLONOSCOPY WITH PROPOFOL (N/A )  Patient location during evaluation: Endoscopy Anesthesia Type: General Level of consciousness: awake and alert and oriented Pain management: pain level controlled Vital Signs Assessment: post-procedure vital signs reviewed and stable Respiratory status: spontaneous breathing, nonlabored ventilation and respiratory function stable Cardiovascular status: blood pressure returned to baseline and stable Postop Assessment: no signs of nausea or vomiting Anesthetic complications: no     Last Vitals:  Vitals:   11/17/17 1012 11/17/17 1039  BP: 98/69 115/87  Pulse:    Resp:    Temp:    SpO2:      Last Pain:  Vitals:   11/17/17 1019  TempSrc:   PainSc: 0-No pain                 Elmyra Banwart

## 2017-11-17 NOTE — Transfer of Care (Signed)
Immediate Anesthesia Transfer of Care Note  Patient: Ricky Mercer  Procedure(s) Performed: ESOPHAGOGASTRODUODENOSCOPY (EGD) (N/A ) COLONOSCOPY WITH PROPOFOL (N/A )  Patient Location: PACU  Anesthesia Type:General  Level of Consciousness: awake and sedated  Airway & Oxygen Therapy: Patient Spontanous Breathing and Patient connected to nasal cannula oxygen  Post-op Assessment: Report given to RN and Post -op Vital signs reviewed and stable  Post vital signs: Reviewed and stable  Last Vitals:  Vitals:   11/17/17 0806  BP: (!) 140/93  Pulse: 94  Resp: 20  Temp: (!) 35.6 C  SpO2: 99%    Last Pain:  Vitals:   11/17/17 0806  TempSrc: Tympanic  PainSc: 4       Patients Stated Pain Goal: 7 (96/88/64 8472)  Complications: No apparent anesthesia complications

## 2017-11-17 NOTE — H&P (Signed)
Outpatient short stay form Pre-procedure 11/17/2017 9:08 AM Lollie Sails MD  Primary Physician: Thereasa Distance, MD  Reason for visit: EGD and colonoscopy  History of present illness: Patient is a 67 year old male presenting today as above.  He has a history of chronic abdominal discomfort generalized in nature mostly right below the rib cage.  Does take medications for reflux.  He does have chronic back pain.  He recently had a CT scan showing some thickening in the sigmoid colon.  He is presenting today for upper and lower scope for further evaluation.  There is a family history of colon cancer in primary relative, brother.  Patient does take 81 mg aspirin that is been held he takes no other aspirin products or blood thinning agent.    Current Facility-Administered Medications:  .  0.9 %  sodium chloride infusion, , Intravenous, Continuous, Lollie Sails, MD, Last Rate: 20 mL/hr at 11/17/17 0825 .  butamben-tetracaine-benzocaine (CETACAINE) 10-15-12 % spray, , , ,   Medications Prior to Admission  Medication Sig Dispense Refill Last Dose  . ANDROGEL PUMP 20.25 MG/ACT (1.62%) GEL APPLY TO 2 PUMPS ONCE A DAY AS DIRECTED  5 Past Week at Unknown time  . aspirin 81 MG tablet Take 81 mg by mouth daily.   11/11/2017 at Unknown time  . [START ON 12/14/2017] oxyCODONE (OXY IR/ROXICODONE) 5 MG immediate release tablet Take 1 tablet (5 mg total) by mouth every 8 (eight) hours as needed for severe pain. 90 tablet 0 11/12/2017 at Unknown time  . oxyCODONE (OXY IR/ROXICODONE) 5 MG immediate release tablet Take 1 tablet (5 mg total) by mouth every 8 (eight) hours as needed for severe pain. 90 tablet 0 11/12/2017 at Unknown time  . simvastatin (ZOCOR) 10 MG tablet Take 10 mg by mouth at bedtime.    11/16/2017 at 2000  . Cholecalciferol 5000 units capsule Take 1 capsule (5,000 Units total) by mouth daily. 90 capsule 0 Taking  . gabapentin (NEURONTIN) 100 MG capsule Take 1-3 capsules (100-300 mg total) by mouth  4 (four) times daily. Follow written titration schedule. 360 capsule 0   . oxyCODONE (OXY IR/ROXICODONE) 5 MG immediate release tablet Take 1 tablet (5 mg total) by mouth every 8 (eight) hours as needed for severe pain. 90 tablet 0   . pantoprazole (PROTONIX) 40 MG tablet Take 40 mg by mouth daily.   Not Taking at Unknown time     No Known Allergies   Past Medical History:  Diagnosis Date  . Bilateral arm pain   . Cardiomyopathy ([place])   . Diverticulosis   . Dupuytren's contracture   . Erosive gastritis   . Foot pain, bilateral   . GERD (gastroesophageal reflux disease)   . Hand weakness   . Hyperlipidemia   . Knee pain   . Midline low back pain   . Osteoarthritis    neck and back  . Pain in neck    hurts to turn side to side  . Prostate cancer [facility])    prostate  . Reactive airway disease   . Seasonal allergies   . Situational anxiety   . Sleep apnea    CPAP - only uses "sometimes"    Review of systems:      Physical Exam    Heart and lungs: Regular rate and rhythm without rub or gallop, lungs are bilaterally clear.    HEENT: Normocephalic atraumatic eyes are anicteric    Other:    Pertinant exam for procedure: Soft,  mild tenderness to palpation throughout the entire abdomen.  There are no masses or rebound.  He is not tender on the rib margin itself.    Planned proceedures: EGD, colonoscopy and indicated procedures. I have discussed the risks benefits and complications of procedures to include not limited to bleeding, infection, perforation and the risk of sedation and the patient wishes to proceed.    Lollie Sails, MD Gastroenterology 11/17/2017  9:08 AM

## 2017-11-17 NOTE — Anesthesia Post-op Follow-up Note (Signed)
Anesthesia QCDR form completed.        

## 2017-11-17 NOTE — Anesthesia Preprocedure Evaluation (Signed)
Anesthesia Evaluation  Patient identified by MRN, date of birth, ID band Patient awake    Reviewed: Allergy & Precautions, NPO status , Patient's Chart, lab work & pertinent test results  History of Anesthesia Complications Negative for: history of anesthetic complications  Airway Mallampati: III  TM Distance: >3 FB Neck ROM: Full    Dental  (+) Chipped, Poor Dentition   Pulmonary sleep apnea (has CPAP prescribed but does not wear it) , neg COPD,    breath sounds clear to auscultation- rhonchi (-) wheezing      Cardiovascular (-) hypertension(-) CAD, (-) Past MI, (-) Cardiac Stents and (-) CABG  Rhythm:Regular Rate:Normal - Systolic murmurs and - Diastolic murmurs    Neuro/Psych PSYCHIATRIC DISORDERS Anxiety Depression negative neurological ROS     GI/Hepatic Neg liver ROS, PUD, GERD  Medicated,  Endo/Other  negative endocrine ROSneg diabetes  Renal/GU negative Renal ROS     Musculoskeletal  (+) Arthritis ,   Abdominal (+) - obese,   Peds  Hematology negative hematology ROS (+)   Anesthesia Other Findings Past Medical History: No date: Bilateral arm pain No date: Cardiomyopathy ([place]) No date: Diverticulosis No date: Dupuytren's contracture No date: Erosive gastritis No date: Foot pain, bilateral No date: GERD (gastroesophageal reflux disease) No date: Hand weakness No date: Hyperlipidemia No date: Knee pain No date: Midline low back pain No date: Osteoarthritis     Comment:  neck and back No date: Pain in neck     Comment:  hurts to turn side to side No date: Prostate cancer ([facility])     Comment:  prostate No date: Reactive airway disease No date: Seasonal allergies No date: Situational anxiety No date: Sleep apnea     Comment:  CPAP - only uses "sometimes"   Reproductive/Obstetrics                            Anesthesia Physical Anesthesia Plan  ASA: II  Anesthesia Plan:  General   Post-op Pain Management:    Induction: Intravenous  PONV Risk Score and Plan: 1 and Propofol infusion  Airway Management Planned: Natural Airway  Additional Equipment:   Intra-op Plan:   Post-operative Plan:   Informed Consent: I have reviewed the patients History and Physical, chart, labs and discussed the procedure including the risks, benefits and alternatives for the proposed anesthesia with the patient or authorized representative who has indicated his/her understanding and acceptance.   Dental advisory given  Plan Discussed with: CRNA and Anesthesiologist  Anesthesia Plan Comments:         Anesthesia Quick Evaluation

## 2017-11-17 NOTE — Op Note (Addendum)
[facility] Gastroenterology Patient Name: Lelon Ikard Procedure Date: 11/17/2017 9:11 AM MRN: [id number] Account #: [id number] Date of Birth: June 18, 1951 Admit Type: Outpatient Age: 67 Room: [facility] ENDO ROOM 1 Gender: Male Note Status: Finalized Procedure:            Colonoscopy Indications:          Family history of colon cancer in a first-degree                        relative, Abnormal CT of the GI tract Providers:            Lollie Sails, MD Referring MD:         Sofie Hartigan (Referring MD) Medicines:            Monitored Anesthesia Care Complications:        No immediate complications. Procedure:            Pre-Anesthesia Assessment:                       - ASA Grade Assessment: III - A patient with severe                        systemic disease.                       After obtaining informed consent, the colonoscope was                        passed under direct vision. Throughout the procedure,                        the patient's blood pressure, pulse, and oxygen                        saturations were monitored continuously. The                        Colonoscope was introduced through the anus and                        advanced to the the cecum, identified by appendiceal                        orifice and ileocecal valve. The colonoscopy was                        performed without difficulty. The patient tolerated the                        procedure well. The quality of the bowel preparation                        was good. Findings:      Two sessile polyps were found in the transverse colon. The polyps were 3       mm in size. These polyps were removed with a cold biopsy forceps.       Resection and retrieval were complete.      A 3 mm polyp was found in the hepatic flexure. The polyp was sessile.       The polyp was removed with a  cold biopsy forceps. Resection and       retrieval were complete.      Two sessile polyps were found in  the proximal transverse colon. The       polyps were 2 to 3 mm in size. These polyps were removed with a cold       biopsy forceps. Resection and retrieval were complete.      Four sessile polyps were found in the proximal descending colon. The       polyps were 3 to 4 mm in size. These polyps were removed with a cold       biopsy forceps. Resection and retrieval were complete.      Multiple small-mouthed diverticula were found in the sigmoid colon and       descending colon.      Non-bleeding internal hemorrhoids were found during retroflexion and       during anoscopy. The hemorrhoids were small and Grade I (internal       hemorrhoids that do not prolapse). Impression:           - Two 3 mm polyps in the transverse colon, removed with                        a cold biopsy forceps. Resected and retrieved.                       - One 3 mm polyp at the hepatic flexure, removed with a                        cold biopsy forceps. Resected and retrieved.                       - Two 2 to 3 mm polyps in the proximal transverse                        colon, removed with a cold biopsy forceps. Resected and                        retrieved.                       - Four 3 to 4 mm polyps in the proximal descending                        colon, removed with a cold biopsy forceps. Resected and                        retrieved.                       - Diverticulosis in the sigmoid colon and in the                        descending colon.                       - Non-bleeding internal hemorrhoids. Recommendation:       - Discharge patient to home.                       - Telephone GI clinic for pathology results in 1 week. Procedure Code(s):    ---  Professional ---                       [phone], Colonoscopy, flexible; with biopsy, single or                        multiple Diagnosis Code(s):    --- Professional ---                       D12.3, Benign neoplasm of transverse colon (hepatic                         flexure or splenic flexure)                       D12.4, Benign neoplasm of descending colon                       K64.0, First degree hemorrhoids                       Z80.0, Family history of malignant neoplasm of                        digestive organs                       K57.30, Diverticulosis of large intestine without                        perforation or abscess without bleeding                       R93.3, Abnormal findings on diagnostic imaging of other                        parts of digestive tract CPT copyright 2016 American Medical Association. All rights reserved. The codes documented in this report are preliminary and upon coder review may  be revised to meet current compliance requirements. Lollie Sails, MD 11/17/2017 10:11:03 AM This report has been signed electronically. Number of Addenda: 0 Note Initiated On: 11/17/2017 9:11 AM Scope Withdrawal Time: 0 hours 21 minutes 17 seconds  Total Procedure Duration: 0 hours 29 minutes 21 seconds       [facility]

## 2017-11-17 NOTE — Op Note (Addendum)
[facility] Patient Name: Ricky Mercer Procedure Date: 11/17/2017 9:12 AM MRN: [id number] Account #: [id number] Date of Birth: Mar 28, 1951 Admit Type: Outpatient Age: 67 Room: [facility] ENDO ROOM 1 Gender: Male Note Status: Finalized Procedure:            Upper GI endoscopy Indications:          Epigastric abdominal pain, Generalized abdominal pain Providers:            Lollie Sails, MD Referring MD:         Sofie Hartigan (Referring MD) Medicines:            Monitored Anesthesia Care Complications:        No immediate complications. Procedure:            Pre-Anesthesia Assessment:                       - ASA Grade Assessment: III - A patient with severe                        systemic disease.                       After obtaining informed consent, the endoscope was                        passed under direct vision. Throughout the procedure,                        the patient's blood pressure, pulse, and oxygen                        saturations were monitored continuously. The Endoscope                        was introduced through the mouth, and advanced to the                        third part of duodenum. The upper GI endoscopy was                        accomplished without difficulty. The patient tolerated                        the procedure well. Findings:      The Z-line was irregular. Biopsies were taken with a cold forceps for       histology.      The exam of the esophagus was otherwise normal.      A small hiatal hernia was present.      Diffuse and patchy mild inflammation characterized by erythema was found       in the gastric body. Biopsies were taken with a cold forceps for       histology.      The cardia and gastric fundus were normal on retroflexion otherwise.      Diffuse mild mucosal variance characterized by smoothness was found in       the entire duodenum. Biopsies were taken with a cold forceps for   histology. Impression:           - Z-line irregular. Biopsied.                       -  Small hiatal hernia.                       - Bile gastritis. Biopsied.                       - Mucosal variant in the duodenum. Biopsied. Recommendation:       - Continue present medications.                       - Await pathology results.                       - Return to GI clinic in 1 month.                       - Await pathology results. Procedure Code(s):    --- Professional ---                       [phone], Esophagogastroduodenoscopy, flexible, transoral;                        with biopsy, single or multiple Diagnosis Code(s):    --- Professional ---                       K22.8, Other specified diseases of esophagus                       K44.9, Diaphragmatic hernia without obstruction or                        gangrene                       K29.60, Other gastritis without bleeding                       K31.89, Other diseases of stomach and duodenum                       R10.13, Epigastric pain                       R10.84, Generalized abdominal pain CPT copyright 2016 [employer]. All rights reserved. The codes documented in this report are preliminary and upon coder review may  be revised to meet current compliance requirements. Lollie Sails, MD 11/17/2017 9:33:14 AM This report has been signed electronically. Number of Addenda: 0 Note Initiated On: 11/17/2017 9:12 AM      [facility]

## 2017-11-18 ENCOUNTER — Encounter: Payer: Self-pay | Admitting: Gastroenterology

## 2017-11-18 LAB — SURGICAL PATHOLOGY

## 2017-12-08 ENCOUNTER — Other Ambulatory Visit: Payer: Self-pay

## 2017-12-08 ENCOUNTER — Encounter: Payer: Self-pay | Admitting: Nurse Practitioner

## 2017-12-08 ENCOUNTER — Ambulatory Visit: Payer: Medicare Other | Attending: Nurse Practitioner | Admitting: Nurse Practitioner

## 2017-12-08 VITALS — BP 131/86 | HR 70 | Temp 97.7°F | Resp 18 | Ht 69.0 in | Wt 185.0 lb

## 2017-12-08 DIAGNOSIS — M4722 Other spondylosis with radiculopathy, cervical region: Secondary | ICD-10-CM | POA: Diagnosis not present

## 2017-12-08 DIAGNOSIS — M79602 Pain in left arm: Secondary | ICD-10-CM | POA: Diagnosis not present

## 2017-12-08 DIAGNOSIS — M25551 Pain in right hip: Secondary | ICD-10-CM | POA: Insufficient documentation

## 2017-12-08 DIAGNOSIS — M792 Neuralgia and neuritis, unspecified: Secondary | ICD-10-CM | POA: Diagnosis not present

## 2017-12-08 DIAGNOSIS — Z9049 Acquired absence of other specified parts of digestive tract: Secondary | ICD-10-CM | POA: Insufficient documentation

## 2017-12-08 DIAGNOSIS — M533 Sacrococcygeal disorders, not elsewhere classified: Secondary | ICD-10-CM | POA: Diagnosis not present

## 2017-12-08 DIAGNOSIS — M79601 Pain in right arm: Secondary | ICD-10-CM | POA: Diagnosis not present

## 2017-12-08 DIAGNOSIS — M720 Palmar fascial fibromatosis [Dupuytren]: Secondary | ICD-10-CM | POA: Diagnosis not present

## 2017-12-08 DIAGNOSIS — M25552 Pain in left hip: Secondary | ICD-10-CM | POA: Diagnosis not present

## 2017-12-08 DIAGNOSIS — K219 Gastro-esophageal reflux disease without esophagitis: Secondary | ICD-10-CM | POA: Diagnosis not present

## 2017-12-08 DIAGNOSIS — G8929 Other chronic pain: Secondary | ICD-10-CM | POA: Diagnosis not present

## 2017-12-08 DIAGNOSIS — M79604 Pain in right leg: Secondary | ICD-10-CM | POA: Diagnosis not present

## 2017-12-08 DIAGNOSIS — M47892 Other spondylosis, cervical region: Secondary | ICD-10-CM | POA: Insufficient documentation

## 2017-12-08 DIAGNOSIS — Z79899 Other long term (current) drug therapy: Secondary | ICD-10-CM | POA: Insufficient documentation

## 2017-12-08 DIAGNOSIS — Z5181 Encounter for therapeutic drug level monitoring: Secondary | ICD-10-CM | POA: Diagnosis not present

## 2017-12-08 DIAGNOSIS — M4802 Spinal stenosis, cervical region: Secondary | ICD-10-CM | POA: Diagnosis not present

## 2017-12-08 DIAGNOSIS — G894 Chronic pain syndrome: Secondary | ICD-10-CM

## 2017-12-08 DIAGNOSIS — Z79891 Long term (current) use of opiate analgesic: Secondary | ICD-10-CM | POA: Diagnosis not present

## 2017-12-08 DIAGNOSIS — M25519 Pain in unspecified shoulder: Secondary | ICD-10-CM | POA: Insufficient documentation

## 2017-12-08 DIAGNOSIS — M546 Pain in thoracic spine: Secondary | ICD-10-CM | POA: Insufficient documentation

## 2017-12-08 DIAGNOSIS — F329 Major depressive disorder, single episode, unspecified: Secondary | ICD-10-CM | POA: Diagnosis not present

## 2017-12-08 DIAGNOSIS — M545 Low back pain: Secondary | ICD-10-CM | POA: Diagnosis not present

## 2017-12-08 DIAGNOSIS — G4730 Sleep apnea, unspecified: Secondary | ICD-10-CM | POA: Diagnosis not present

## 2017-12-08 DIAGNOSIS — M47816 Spondylosis without myelopathy or radiculopathy, lumbar region: Secondary | ICD-10-CM

## 2017-12-08 DIAGNOSIS — M79605 Pain in left leg: Secondary | ICD-10-CM | POA: Diagnosis not present

## 2017-12-08 DIAGNOSIS — M79671 Pain in right foot: Secondary | ICD-10-CM | POA: Diagnosis not present

## 2017-12-08 DIAGNOSIS — Z8546 Personal history of malignant neoplasm of prostate: Secondary | ICD-10-CM | POA: Insufficient documentation

## 2017-12-08 DIAGNOSIS — I429 Cardiomyopathy, unspecified: Secondary | ICD-10-CM | POA: Diagnosis not present

## 2017-12-08 DIAGNOSIS — E785 Hyperlipidemia, unspecified: Secondary | ICD-10-CM | POA: Insufficient documentation

## 2017-12-08 DIAGNOSIS — M549 Dorsalgia, unspecified: Secondary | ICD-10-CM

## 2017-12-08 DIAGNOSIS — M8938 Hypertrophy of bone, other site: Secondary | ICD-10-CM | POA: Insufficient documentation

## 2017-12-08 DIAGNOSIS — G629 Polyneuropathy, unspecified: Secondary | ICD-10-CM | POA: Insufficient documentation

## 2017-12-08 DIAGNOSIS — Z7982 Long term (current) use of aspirin: Secondary | ICD-10-CM | POA: Insufficient documentation

## 2017-12-08 MED ORDER — OXYCODONE HCL 5 MG PO TABS: 5.0000 mg | ORAL_TABLET | Freq: Three times a day (TID) | ORAL | 0 refills | Status: DC | PRN

## 2017-12-08 MED ORDER — GABAPENTIN 100 MG PO CAPS: 100.0000 mg | ORAL_CAPSULE | Freq: Four times a day (QID) | ORAL | 0 refills | Status: DC

## 2017-12-08 NOTE — Progress Notes (Signed)
Patient's Name: Ricky Mercer  MRN: [id number]  Referring Provider: Sofie Hartigan, MD  DOB: Dec 20, 1950  PCP: Sofie Hartigan, MD  DOS: 12/08/2017  Note by: Vevelyn Francois NP  Service setting: Ambulatory outpatient  Specialty: Interventional Pain Management  Location: [facility] (AMB) Pain Management Facility    Patient type: Established    Primary Reason(s) for Visit: Encounter for prescription drug management. (Level of risk: moderate)  CC: Back Pain (low) and Hip Pain (bilateral)  HPI  Ricky Mercer is a 67 y.o. year old, male patient, who comes today for a medication management evaluation. He has Hand weakness; Polyradiculopathy; Long term current use of opiate analgesic; Long term prescription opiate use; Opiate use (45 MME/Day); Opiate dependence ([place]); Encounter for therapeutic drug level monitoring; Chronic low back pain  (Location of Primary Pain) (Bilateral) (L>R); Chronic lower extremity pain (Location of Tertiary source of pain) (Bilateral) (L>R); Chronic lumbar radicular pain (Bilateral) (L>R) (Bilateral S1 dermatomal distribution); Chronic hip pain (Location of Secondary source of pain) (Bilateral) (L>R); Chronic upper back pain (Bilateral) (L>R); Chronic neck pain (neck<shoulder) (L>R); Chronic foot pain; Chronic knee pain (Right); Cervical spondylosis; 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); Chronic upper extremity pain (Bilateral) (L>R); Lumbar spondylosis; Cervical facet hypertrophy; Cervical facet syndrome (R>L); History of prostate cancer; Radicular pain of shoulder; Class I Morbid obesity (HCC) (68% higher incidence of chronic low back problems); Peripheral neuropathy (HCC) (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); Lumbar facet syndrome (Bilateral) (L>R); Sleep apnea; Depression; High cholesterol; Low testosterone; History of surgical procedure; Opioid-induced constipation (OIC); Chronic shoulder pain (Bilateral) (L>R); Breathing orally; Chest pain;  Primary cardiomyopathy ([place]); Chronic sacroiliac joint pain (Bilateral) (L>R); Musculoskeletal pain; Pain of right foot; Neurogenic pain; Chest pain with high risk for cardiac etiology; Chronic pain syndrome; Bilateral arm pain; Acute postoperative pain; and Vitamin D insufficiency on their problem list. His primarily concern today is the Back Pain (low) and Hip Pain (bilateral)  Pain Assessment: Location: Mid, Lower Back Radiating: hips Onset: More than a month ago Duration: Chronic pain Quality: Stabbing, Radiating, Constant Severity: 4 /10 (self-reported pain score)  Note: Reported level is compatible with observation.                          Timing: Constant Modifying factors: medications, activity  Ricky Mercer was last scheduled for an appointment on 09/15/2017 for medication management. During today's appointment we reviewed Ricky Mercer chronic pain status, as well as his outpatient medication regimen. He is concern that his back pain is getting worse. He admits that he has to get up during the night secondary to having back pain. He is wondering if the DDD gets worse. 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 He admits he lifts heavy objects at times. He is concern with how bad it will get. He denies doing any stretching exercises.   The patient  reports that he does not use drugs. His body mass index is 27.32 kg/m.  Further details on both, my assessment(s), as well as the proposed treatment plan, please see below.  Controlled Substance Pharmacotherapy Assessment REMS (Risk Evaluation and Mitigation Strategy)  Analgesic:Oxycodone IR 650m every 8 hours (188mday) MME/day:22.50m86may.   ShaHart RochesterN  12/08/2017 10:23 AM  Sign at close encounter Nursing Pain Medication Assessment:  Safety precautions to be maintained throughout the outpatient stay will include: orient to surroundings, keep bed in low position, maintain call bell within  reach at all  times, provide assistance with transfer out of bed and ambulation.  Medication Inspection Compliance: Pill count conducted under aseptic conditions, in front of the patient. Neither the pills nor the bottle was removed from the patient's sight at any time. Once count was completed pills were immediately returned to the patient in their original bottle.  Medication: Oxycodone IR Pill/Patch Count: 24 of 90 pills remain Pill/Patch Appearance: Markings consistent with prescribed medication Bottle Appearance: Standard pharmacy container. Clearly labeled. Filled Date: 03 / 06 / 2019 Last Medication intake:  Yesterday   Pharmacokinetics: Liberation and absorption (onset of action): WNL Distribution (time to peak effect): WNL Metabolism and excretion (duration of action): WNL         Pharmacodynamics: Desired effects: Analgesia: Ricky Mercer reports >50% benefit. Functional ability: Patient reports that medication allows him to accomplish basic ADLs Clinically meaningful improvement in function (CMIF): Sustained CMIF goals met Perceived effectiveness: Described as relatively effective, allowing for increase in activities of daily living (ADL) Undesirable effects: Side-effects or Adverse reactions: None reported Monitoring: [place] PMP: Online review of the past 46-monthperiod conducted. Compliant with practice rules and regulations Last UDS on record: Summary  Date Value Ref Range Status  03/15/2017 FINAL  Final    Comment:    ==================================================================== TOXASSURE SELECT 13 (MW) ==================================================================== Test                             Result       Flag       Units Drug Present and Declared for Prescription Verification   Oxycodone                      610          EXPECTED   ng/mg creat   Oxymorphone                    364          EXPECTED   ng/mg creat   Noroxycodone                   821          EXPECTED    ng/mg creat   Noroxymorphone                 214          EXPECTED   ng/mg creat    Sources of oxycodone are scheduled prescription medications.    Oxymorphone, noroxycodone, and noroxymorphone are expected    metabolites of oxycodone. Oxymorphone is also available as a    scheduled prescription medication. ==================================================================== Test                      Result    Flag   Units      Ref Range   Creatinine              325              mg/dL      >=20 ==================================================================== Declared Medications:  The flagging and interpretation on this report are based on the  following declared medications.  Unexpected results may arise from  inaccuracies in the declared medications.  **Note: The testing scope of this panel includes these medications:  Oxycodone (Roxicodone)  **Note: The testing scope of this panel does not include following  reported medications:  Aspirin (Aspirin  81)  Cyclobenzaprine (Flexeril)  Pantoprazole (Protonix)  Sertraline (Zoloft)  Simvastatin (Zocor)  Testosterone  Venlafaxine (Effexor) ==================================================================== For clinical consultation, please call [phone]. ====================================================================    UDS interpretation: Compliant          Medication Assessment Form: Reviewed. Patient indicates being compliant with therapy Treatment compliance: Compliant Risk Assessment Profile: Aberrant behavior: See prior evaluations. None observed or detected today Comorbid factors increasing risk of overdose: See prior notes. No additional risks detected today Risk of substance use disorder (SUD): Low Opioid Risk Tool - 12/08/17 1031      Family History of Substance Abuse   Alcohol  Negative    Illegal Drugs  Negative    Rx Drugs  Negative      Personal History of Substance Abuse   Alcohol  Negative     Illegal Drugs  Negative    Rx Drugs  Negative      Age   Age between 41-45 years   No      History of Preadolescent Sexual Abuse   History of Preadolescent Sexual Abuse  Negative or Male      Psychological Disease   Psychological Disease  Negative    Depression  Negative      Total Score   Opioid Risk Tool Scoring  0    Opioid Risk Interpretation  Low Risk      ORT Scoring interpretation table:  Score <3 = Low Risk for SUD  Score between 4-7 = Moderate Risk for SUD  Score >8 = High Risk for Opioid Abuse   Risk Mitigation Strategies:  Patient Counseling: Covered Patient-Prescriber Agreement (PPA): Present and active  Notification to other healthcare providers: Done  Pharmacologic Plan: No change in therapy, at this time.             Laboratory Chemistry  Inflammation Markers (CRP: Acute Phase) (ESR: Chronic Phase) Lab Results  Component Value Date   CRP <0.5 08/21/2015   ESRSEDRATE 4 08/21/2015                         Rheumatology Markers No results found for: RF, ANA, [facility]                      Renal Function Markers Lab Results  Component Value Date   BUN 13 11/25/2015   CREATININE 0.96 11/25/2015   GFRAA >60 11/25/2015   GFRNONAA >60 11/25/2015                              Hepatic Function Markers Lab Results  Component Value Date   AST 31 11/25/2015   ALT 41 11/25/2015   ALBUMIN 3.9 11/25/2015   ALKPHOS 71 11/25/2015                        Electrolytes Lab Results  Component Value Date   NA 138 11/25/2015   K 4.2 11/25/2015   CL 104 11/25/2015   CALCIUM 9.0 11/25/2015   MG 2.1 08/21/2015                        Neuropathy Markers No results found for: VITAMINB12, FOLATE, HGBA1C, HIV                      Bone Pathology Markers No results found for: VD25OH,  [id number], AC1660YT0, ZS0109NA3, 25OHVITD1, 25OHVITD2, 25OHVITD3, TESTOFREE, TESTOSTERONE                       Coagulation Parameters Lab Results   Component Value Date   PLT 238 11/25/2015                        Cardiovascular Markers Lab Results  Component Value Date   HGB 14.6 11/25/2015   HCT 43.5 11/25/2015                         CA Markers No results found for: CEA, CA125, LABCA2                      Note: Lab results reviewed.  Recent Diagnostic Imaging Results  CT ABDOMEN PELVIS W CONTRAST CLINICAL DATA:  Abdominal pain for approximately 1 month. History of prostate carcinoma  EXAM: CT ABDOMEN AND PELVIS WITH CONTRAST  TECHNIQUE: Multidetector CT imaging of the abdomen and pelvis was performed using the standard protocol following bolus administration of intravenous contrast. Oral contrast was also administered.  CONTRAST:  59m ISOVUE-370 IOPAMIDOL (ISOVUE-370) INJECTION 76%  COMPARISON:  September 05, 2015  FINDINGS: Lower chest: Lung bases are clear.  Hepatobiliary: No focal liver lesions are apparent. Gallbladder is absent. There is no biliary duct dilatation.  Pancreas: There are areas of fatty infiltration within the pancreas. There is no pancreatic mass or inflammatory focus.  Spleen: No splenic lesions are evident.  Adrenals/Urinary Tract: Adrenals bilaterally appear normal. There is a parapelvic cyst in the right kidney measuring 1.5 x 1.5 cm. There are scattered subcentimeter cysts in each kidney as well. There is no appreciable hydronephrosis on either side. There is no evident renal or ureteral calculus on either side. Urinary bladder is midline with wall thickness within normal limits.  Stomach/Bowel: There are multiple sigmoid diverticula without diverticulitis. There is felt to be a degree of muscular hypertrophy in the sigmoid colon from chronic diverticulosis. Elsewhere there is no bowel wall thickening. No mesenteric thickening. No evident bowel obstruction. No free air or portal venous air.  Vascular/Lymphatic: There is no abdominal aortic aneurysm. No vascular lesions are  evident on this study. Major mesenteric vessels appear widely patent. No adenopathy is appreciable in the abdomen or pelvis.  Reproductive: There are seed implants in the prostate. Prostate and seminal vesicles appear normal in size and contour. There is no evident pelvic mass.  Other: Appendix appears normal. There is no ascites or abscess in the abdomen or pelvis. There is postoperative change along the wall of the anterior left pelvis with a left inguinal hernia repair. No appreciable hernia present currently.  Musculoskeletal: There are no blastic or lytic bone lesions. There is no intramuscular or abdominal wall lesion evident.  IMPRESSION: 1. Sigmoid diverticula. Mild wall thickening in the sigmoid colon is felt to be due to muscular hypertrophy from chronic diverticulosis. No frank diverticulitis evident.  2.  No bowel obstruction.  No abscess.  Appendix appears normal.  3. No appreciable renal or ureteral calculus. No hydronephrosis. No urinary bladder wall thickening evident.  4. Seed implants in prostate. Prostate and seminal vesicles normal in size and contour.  5.  Gallbladder absent.  Prior left inguinal hernia repair.  Electronically Signed   By: WLowella GripIII M.D.   On: 09/07/2017 12:01  Complexity Note: Imaging results reviewed. Results shared with Mr. LCypert using  [place]'s terms.                         Meds   Current Outpatient Medications:  .  ANDROGEL PUMP 20.25 MG/ACT (1.62%) GEL, APPLY TO 2 PUMPS ONCE A DAY AS DIRECTED, Disp: , Rfl: 5 .  aspirin 81 MG tablet, Take 81 mg by mouth daily., Disp: , Rfl:  .  cefUROXime (CEFTIN) 500 MG tablet, Take 500 mg by mouth 2 (two) times daily., Disp: , Rfl: 0 .  [START ON 02/14/2018] oxyCODONE (OXY IR/ROXICODONE) 5 MG immediate release tablet, Take 1 tablet (5 mg total) by mouth every 8 (eight) hours as needed for severe pain., Disp: 90 tablet, Rfl: 0 .  [START ON 01/15/2018] oxyCODONE (OXY IR/ROXICODONE) 5  MG immediate release tablet, Take 1 tablet (5 mg total) by mouth every 8 (eight) hours as needed for severe pain., Disp: 90 tablet, Rfl: 0 .  [START ON 03/17/2018] oxyCODONE (OXY IR/ROXICODONE) 5 MG immediate release tablet, Take 1 tablet (5 mg total) by mouth every 8 (eight) hours as needed for severe pain., Disp: 90 tablet, Rfl: 0 .  pantoprazole (PROTONIX) 40 MG tablet, Take 40 mg by mouth daily., Disp: , Rfl:  .  simvastatin (ZOCOR) 10 MG tablet, Take 10 mg by mouth at bedtime. , Disp: , Rfl:  .  Cholecalciferol 5000 units capsule, Take 1 capsule (5,000 Units total) by mouth daily., Disp: 90 capsule, Rfl: 0 .  gabapentin (NEURONTIN) 100 MG capsule, Take 1-3 capsules (100-300 mg total) by mouth 4 (four) times daily. Follow written titration schedule., Disp: 360 capsule, Rfl: 0  ROS  Constitutional: Denies any fever or chills Gastrointestinal: No reported hemesis, hematochezia, vomiting, or acute GI distress Musculoskeletal: Denies any acute onset joint swelling, redness, loss of ROM, or weakness Neurological: No reported episodes of acute onset apraxia, aphasia, dysarthria, agnosia, amnesia, paralysis, loss of coordination, or loss of consciousness  Allergies  Mr. Luckey has No Known Allergies.  PFSH  Drug: Mr. Potash  reports that he does not use drugs. Alcohol:  reports that he does not drink alcohol. Tobacco:  reports that he has never smoked. He has never used smokeless tobacco. Medical:  has a past medical history of Bilateral arm pain, Cardiomyopathy ([place]), Diverticulosis, Dupuytren's contracture, Erosive gastritis, Foot pain, bilateral, GERD (gastroesophageal reflux disease), Hand weakness, Hyperlipidemia, Knee pain, Midline low back pain, Osteoarthritis, Pain in neck, Prostate cancer ([place]), Reactive airway disease, Seasonal allergies, Situational anxiety, and Sleep apnea. Surgical: Mr. Dames  has a past surgical history that includes Nose surgery; Hand surgery (Bilateral);  Colonoscopy (10/24/2012); Upper gi endoscopy (12/18/2012); Tonsillectomy (2009); Skin cancer excision (Left, 2015); Mass excision (Right, 07/18/2015); Esophagogastroduodenoscopy (egd) with propofol (N/A, 10/28/2015); prostate seeding; Hernia repair; Cholecystectomy (N/A, 12/02/2015); Esophagogastroduodenoscopy (N/A, 11/17/2017); and Colonoscopy with propofol (N/A, 11/17/2017). Family: family history includes Colon cancer in his brother; Hyperlipidemia in his sister; Lung cancer in his father; Prostate cancer in his brother.  Constitutional Exam  General appearance: Well nourished, well developed, and well hydrated. In no apparent acute distress Vitals:   12/08/17 1023  BP: 131/86  Pulse: 70  Resp: 18  Temp: 97.7 F (36.5 C)  TempSrc: Oral  SpO2: 100%  Weight: 185 lb (83.9 kg)  Height: 5' 9"  (1.753 m)   BMI Assessment: Estimated body mass index is 27.32 kg/m as calculated from the following:   Height as of this encounter: 5' 9"  (1.753 m).   Weight as of this encounter: 185 lb (  83.9 kg). Psych/Mental status: Alert, oriented x 3 (person, place, & time)       Eyes: PERLA Respiratory: No evidence of acute respiratory distress  Cervical Spine Area Exam  Skin & Axial Inspection: No masses, redness, edema, swelling, or associated skin lesions Alignment: Symmetrical Functional ROM: Unrestricted ROM      Stability: No instability detected Muscle Tone/Strength: Functionally intact. No obvious neuro-muscular anomalies detected. Sensory (Neurological): Unimpaired Palpation: No palpable anomalies              Upper Extremity (UE) Exam    Side: Right upper extremity  Side: Left upper extremity  Skin & Extremity Inspection: Skin color, temperature, and hair growth are WNL. No peripheral edema or cyanosis. No masses, redness, swelling, asymmetry, or associated skin lesions. No contractures.  Skin & Extremity Inspection: Skin color, temperature, and hair growth are WNL. No peripheral edema or cyanosis. No  masses, redness, swelling, asymmetry, or associated skin lesions. No contractures.  Functional ROM: Unrestricted ROM          Functional ROM: Unrestricted ROM          Muscle Tone/Strength: Functionally intact. No obvious neuro-muscular anomalies detected.  Muscle Tone/Strength: Functionally intact. No obvious neuro-muscular anomalies detected.  Sensory (Neurological): Unimpaired          Sensory (Neurological): Unimpaired          Palpation: No palpable anomalies              Palpation: No palpable anomalies              Specialized Test(s): Deferred         Specialized Test(s): Deferred          Thoracic Spine Area Exam  Skin & Axial Inspection: No masses, redness, or swelling Alignment: Symmetrical Functional ROM: Unrestricted ROM Stability: No instability detected Muscle Tone/Strength: Functionally intact. No obvious neuro-muscular anomalies detected. Sensory (Neurological): Unimpaired Muscle strength & Tone: Complains of area being tender to palpation  Lumbar Spine Area Exam  Skin & Axial Inspection: No masses, redness, or swelling Alignment: Symmetrical Functional ROM: Unrestricted ROM      Stability: No instability detected Muscle Tone/Strength: Functionally intact. No obvious neuro-muscular anomalies detected. Sensory (Neurological): Unimpaired Palpation: Complains of area being tender to palpation       Provocative Tests: Lumbar Hyperextension and rotation test: evaluation deferred today       Lumbar Lateral bending test: evaluation deferred today       Patrick's Maneuver: evaluation deferred today                    Gait & Posture Assessment  Ambulation: Unassisted Gait: Relatively normal for age and body habitus Posture: WNL   Lower Extremity Exam    Side: Right lower extremity  Side: Left lower extremity  Skin & Extremity Inspection: Skin color, temperature, and hair growth are WNL. No peripheral edema or cyanosis. No masses, redness, swelling, asymmetry, or associated  skin lesions. No contractures.  Skin & Extremity Inspection: Skin color, temperature, and hair growth are WNL. No peripheral edema or cyanosis. No masses, redness, swelling, asymmetry, or associated skin lesions. No contractures.  Functional ROM: Unrestricted ROM          Functional ROM: Unrestricted ROM          Muscle Tone/Strength: Functionally intact. No obvious neuro-muscular anomalies detected.  Muscle Tone/Strength: Functionally intact. No obvious neuro-muscular anomalies detected.  Sensory (Neurological): Unimpaired  Sensory (Neurological): Unimpaired  Palpation: No palpable  anomalies  Palpation: No palpable anomalies   Assessment  Primary Diagnosis & Pertinent Problem List: The primary encounter diagnosis was Lumbar spondylosis. Diagnoses of Chronic upper back pain (Bilateral) (L>R), Osteoarthritis of spine with radiculopathy, cervical region, Neurogenic pain, Chronic pain syndrome, and Long term current use of opiate analgesic were also pertinent to this visit.  Status Diagnosis  Worsening Worsening Stable 1. Lumbar spondylosis   2. Chronic upper back pain (Bilateral) (L>R)   3. Osteoarthritis of spine with radiculopathy, cervical region   4. Neurogenic pain   5. Chronic pain syndrome   6. Long term current use of opiate analgesic     Problems updated and reviewed during this visit: No problems updated. Plan of Care  Pharmacotherapy (Medications Ordered): Meds ordered this encounter  Medications  . oxyCODONE (OXY IR/ROXICODONE) 5 MG immediate release tablet    Sig: Take 1 tablet (5 mg total) by mouth every 8 (eight) hours as needed for severe pain.    Dispense:  90 tablet    Refill:  0    Do not add this medication to the electronic "Automatic Refill" notification system. Patient may have prescription filled one day early if pharmacy is closed on scheduled refill date. Do not fill until:02/14/2018 To last until: 03/16/2018    Order Specific Question:   Supervising Provider     Answer:   Milinda Pointer [phone]  . oxyCODONE (OXY IR/ROXICODONE) 5 MG immediate release tablet    Sig: Take 1 tablet (5 mg total) by mouth every 8 (eight) hours as needed for severe pain.    Dispense:  90 tablet    Refill:  0    Do not add this medication to the electronic "Automatic Refill" notification system. Patient may have prescription filled one day early if pharmacy is closed on scheduled refill date. Do not fill until: 01/15/2018 To last until:02/14/2018    Order Specific Question:   Supervising Provider    Answer:   Milinda Pointer [phone]  . oxyCODONE (OXY IR/ROXICODONE) 5 MG immediate release tablet    Sig: Take 1 tablet (5 mg total) by mouth every 8 (eight) hours as needed for severe pain.    Dispense:  90 tablet    Refill:  0    Do not add this medication to the electronic "Automatic Refill" notification system. Patient may have prescription filled one day early if pharmacy is closed on scheduled refill date. Do not fill until:03/17/2018 To last until: 04/16/2018    Order Specific Question:   Supervising Provider    Answer:   Milinda Pointer [phone]  . gabapentin (NEURONTIN) 100 MG capsule    Sig: Take 1-3 capsules (100-300 mg total) by mouth 4 (four) times daily. Follow written titration schedule.    Dispense:  360 capsule    Refill:  0    Do not place this medication, or any other prescription from our practice, on "Automatic Refill". Patient may have prescription filled one day early if pharmacy is closed on scheduled refill date.    Order Specific Question:   Supervising Provider    Answer:   Milinda Pointer [[id number]]   New Prescriptions   No medications on file   Medications administered today: Delane Ginger had no medications administered during this visit. Lab-work, procedure(s), and/or referral(s): Orders Placed This Encounter  Procedures  . ToxASSURE Select 13 (MW), Urine   Imaging and/or referral(s): None  Interventional therapies: Planned,  scheduled, and/or pending:  Encouraged stretching, moderation with lifting and using proper body  mechanic along with bracing. Encouraged use of Calcium and Vitamin D along with balanced nutritional diet.   Considering:  Palliative Left cervical epidural steroid injection  Possible bilateral sacroiliac joint + lumbar facet RFA Diagnostic Bilateral HipBlock Diagnostic bilateral femoral nerve and obturator nerve block Possible bilateral femoral nerve and obturator nerve RFA Diagnostic bilateral cervical facet block. Possible bilateral cervical facet RFA. Diagnostic Caudal ESI + diagnostic epidurogram Diagnostic Right Knee injection. Possible series of 5 right-sided intra-articular [place] injections Diagnostic right Genicular NB Possible right Genicular Nerve RFA. Diagnostic bilateral intra-articular shoulderinjection Diagnostic bilateral suprascapular NB. Possible bilateral suprascapular Nerve RFA.   Palliative PRN treatment(s):  Diagnostic Bilateral Lumbar Facet & Bilateral S-I joint block Diagnostic Bilateral Hip Block Diagnostic bilateral cervical facetblock. Diagnostic Caudal ESI. Diagnostic Left CESI. Diagnostic Right Knee injection. Diagnostic right Genicular NB Diagnostic bilateral intra-articular shoulderinj. Diagnostic bilateral suprascapular NB      Provider-requested follow-up: Return in about 3 months (around 03/22/2018) for MedMgmt with Me Don[facility]).  Future Appointments  Date Time Provider [place]  03/22/2018 10:30 AM Vevelyn Francois, NP [facility] None   Primary Care Physician: Sofie Hartigan, MD Location: [facility] Note by: Vevelyn Francois NP Date: 12/08/2017; Time: 1:08 PM  Pain Score Disclaimer: We use the NRS-11 scale. This is a self-reported, subjective measurement of pain severity with only modest accuracy. It is used primarily to identify changes within a particular patient. It must be  understood that outpatient pain scales are significantly less accurate that those used for research, where they can be applied under ideal controlled circumstances with minimal exposure to variables. In reality, the score is likely to be a combination of pain intensity and pain affect, where pain affect describes the degree of emotional arousal or changes in action readiness caused by the sensory experience of pain. Factors such as social and work situation, setting, emotional state, anxiety levels, expectation, and prior pain experience may influence pain perception and show large inter-individual differences that may also be affected by time variables.  Patient instructions provided during this appointment: Patient Instructions  ____________________________________________________________________________________________  Medication Rules  Applies to: All patients receiving prescriptions (written or electronic).  Pharmacy of record: Pharmacy where electronic prescriptions will be sent. If written prescriptions are taken to a different pharmacy, please inform the nursing staff. The pharmacy listed in the electronic medical record should be the one where you would like electronic prescriptions to be sent.  Prescription refills: Only during scheduled appointments. Applies to both, written and electronic prescriptions.  NOTE: The following applies primarily to controlled substances (Opioid* Pain Medications).   Patient's responsibilities: 1. Pain Pills: Bring all pain pills to every appointment (except for procedure appointments). 2. Pill Bottles: Bring pills in original pharmacy bottle. Always bring newest bottle. Bring bottle, even if empty. 3. Medication refills: You are responsible for knowing and keeping track of what medications you need refilled. The day before your appointment, write a list of all prescriptions that need to be refilled. Bring that list to your appointment and give it to the  admitting nurse. Prescriptions will be written only during appointments. If you forget a medication, it will not be "Called in", "Faxed", or "electronically sent". You will need to get another appointment to get these prescribed. 4. Prescription Accuracy: You are responsible for carefully inspecting your prescriptions before leaving our office. Have the discharge nurse carefully go over each prescription with you, before taking them home. Make sure that your name is accurately spelled,  that your address is correct. Check the name and dose of your medication to make sure it is accurate. Check the number of pills, and the written instructions to make sure they are clear and accurate. Make sure that you are given enough medication to last until your next medication refill appointment. 5. Taking Medication: Take medication as prescribed. Never take more pills than instructed. Never take medication more frequently than prescribed. Taking less pills or less frequently is permitted and encouraged, when it comes to controlled substances (written prescriptions).  6. Inform other Doctors: Always inform, all of your healthcare providers, of all the medications you take. 7. Pain Medication from other Providers: You are not allowed to accept any additional pain medication from any other Doctor or Healthcare provider. There are two exceptions to this rule. (see below) In the event that you require additional pain medication, you are responsible for notifying us, as stated below. 8. Medication Agreement: You are responsible for carefully reading and following our Medication Agreement. This must be signed before receiving any prescriptions from our practice. Safely store a copy of your signed Agreement. Violations to the Agreement will result in no further prescriptions. (Additional copies of our Medication Agreement are available upon request.) 9. Laws, Rules, & Regulations: All patients are expected to follow all Federal  and [employer], Rules, [employer]. Ignorance of the Laws does not constitute a valid excuse. The use of any illegal substances is prohibited. 10. Adopted CDC guidelines & recommendations: Target dosing levels will be at or below 60 MME/day. Use of benzodiazepines** is not recommended.  Exceptions: There are only two exceptions to the rule of not receiving pain medications from other Healthcare Providers. 1. Exception #1 (Emergencies): In the event of an emergency (i.e.: accident requiring emergency care), you are allowed to receive additional pain medication. However, you are responsible for: As soon as you are able, call our office [phone], at any time of the day or night, and leave a message stating your name, the date and nature of the emergency, and the name and dose of the medication prescribed. In the event that your call is answered by a member of our staff, make sure to document and save the date, time, and the name of the person that took your information.  2. Exception #2 (Planned Surgery): In the event that you are scheduled by another doctor or dentist to have any type of surgery or procedure, you are allowed (for a period no longer than 30 days), to receive additional pain medication, for the acute post-op pain. However, in this case, you are responsible for picking up a copy of our "Post-op Pain Management for Surgeons" handout, and giving it to your surgeon or dentist. This document is available at our office, and does not require an appointment to obtain it. Simply go to our office during business hours (Monday-Thursday from 8:00 AM to 4:00 PM) (Friday 8:00 AM to 12:00 Noon) or if you have a scheduled appointment with [place], prior to your surgery, and ask for it by name. In addition, you will need to provide [place] with your name, name of your surgeon, type of surgery, and date of procedure or surgery.  *Opioid medications include: morphine, codeine, oxycodone, oxymorphone,  hydrocodone, hydromorphone, meperidine, tramadol, tapentadol, buprenorphine, fentanyl, methadone. **Benzodiazepine medications include: diazepam (Valium), alprazolam (Xanax), clonazepam (Klonopine), lorazepam (Ativan), clorazepate (Tranxene), chlordiazepoxide (Librium), estazolam (Prosom), oxazepam (Serax), temazepam (Restoril), triazolam (Halcion) (Last updated: 11/10/2017) ____________________________________________________________________________________________

## 2017-12-08 NOTE — Progress Notes (Signed)
Nursing Pain Medication Assessment:  Safety precautions to be maintained throughout the outpatient stay will include: orient to surroundings, keep bed in low position, maintain call bell within reach at all times, provide assistance with transfer out of bed and ambulation.  Medication Inspection Compliance: Pill count conducted under aseptic conditions, in front of the patient. Neither the pills nor the bottle was removed from the patient's sight at any time. Once count was completed pills were immediately returned to the patient in their original bottle.  Medication: Oxycodone IR Pill/Patch Count: 24 of 90 pills remain Pill/Patch Appearance: Markings consistent with prescribed medication Bottle Appearance: Standard pharmacy container. Clearly labeled. Filled Date: 03 / 06 / 2019 Last Medication intake:  Yesterday

## 2017-12-08 NOTE — Patient Instructions (Signed)
____________________________________________________________________________________________  Medication Rules  Applies to: All patients receiving prescriptions (written or electronic).  Pharmacy of record: Pharmacy where electronic prescriptions will be sent. If written prescriptions are taken to a different pharmacy, please inform the nursing staff. The pharmacy listed in the electronic medical record should be the one where you would like electronic prescriptions to be sent.  Prescription refills: Only during scheduled appointments. Applies to both, written and electronic prescriptions.  NOTE: The following applies primarily to controlled substances (Opioid* Pain Medications).   Patient's responsibilities: 1. Pain Pills: Bring all pain pills to every appointment (except for procedure appointments). 2. Pill Bottles: Bring pills in original pharmacy bottle. Always bring newest bottle. Bring bottle, even if empty. 3. Medication refills: You are responsible for knowing and keeping track of what medications you need refilled. The day before your appointment, write a list of all prescriptions that need to be refilled. Bring that list to your appointment and give it to the admitting nurse. Prescriptions will be written only during appointments. If you forget a medication, it will not be "Called in", "Faxed", or "electronically sent". You will need to get another appointment to get these prescribed. 4. Prescription Accuracy: You are responsible for carefully inspecting your prescriptions before leaving our office. Have the discharge nurse carefully go over each prescription with you, before taking them home. Make sure that your name is accurately spelled, that your address is correct. Check the name and dose of your medication to make sure it is accurate. Check the number of pills, and the written instructions to make sure they are clear and accurate. Make sure that you are given enough medication to last  until your next medication refill appointment. 5. Taking Medication: Take medication as prescribed. Never take more pills than instructed. Never take medication more frequently than prescribed. Taking less pills or less frequently is permitted and encouraged, when it comes to controlled substances (written prescriptions).  6. Inform other Doctors: Always inform, all of your healthcare providers, of all the medications you take. 7. Pain Medication from other Providers: You are not allowed to accept any additional pain medication from any other Doctor or Healthcare provider. There are two exceptions to this rule. (see below) In the event that you require additional pain medication, you are responsible for notifying us, as stated below. 8. Medication Agreement: You are responsible for carefully reading and following our Medication Agreement. This must be signed before receiving any prescriptions from our practice. Safely store a copy of your signed Agreement. Violations to the Agreement will result in no further prescriptions. (Additional copies of our Medication Agreement are available upon request.) 9. Laws, Rules, & Regulations: All patients are expected to follow all Federal and State Laws, Statutes, Rules, & Regulations. Ignorance of the Laws does not constitute a valid excuse. The use of any illegal substances is prohibited. 10. Adopted CDC guidelines & recommendations: Target dosing levels will be at or below 60 MME/day. Use of benzodiazepines** is not recommended.  Exceptions: There are only two exceptions to the rule of not receiving pain medications from other Healthcare Providers. 1. Exception #1 (Emergencies): In the event of an emergency (i.e.: accident requiring emergency care), you are allowed to receive additional pain medication. However, you are responsible for: As soon as you are able, call our office [phone], at any time of the day or night, and leave a message stating your name, the  date and nature of the emergency, and the name and dose of the medication   prescribed. In the event that your call is answered by a member of our staff, make sure to document and save the date, time, and the name of the person that took your information.  2. Exception #2 (Planned Surgery): In the event that you are scheduled by another doctor or dentist to have any type of surgery or procedure, you are allowed (for a period no longer than 30 days), to receive additional pain medication, for the acute post-op pain. However, in this case, you are responsible for picking up a copy of our "Post-op Pain Management for Surgeons" handout, and giving it to your surgeon or dentist. This document is available at our office, and does not require an appointment to obtain it. Simply go to our office during business hours (Monday-Thursday from 8:00 AM to 4:00 PM) (Friday 8:00 AM to 12:00 Noon) or if you have a scheduled appointment with us, prior to your surgery, and ask for it by name. In addition, you will need to provide us with your name, name of your surgeon, type of surgery, and date of procedure or surgery.  *Opioid medications include: morphine, codeine, oxycodone, oxymorphone, hydrocodone, hydromorphone, meperidine, tramadol, tapentadol, buprenorphine, fentanyl, methadone. **Benzodiazepine medications include: diazepam (Valium), alprazolam (Xanax), clonazepam (Klonopine), lorazepam (Ativan), clorazepate (Tranxene), chlordiazepoxide (Librium), estazolam (Prosom), oxazepam (Serax), temazepam (Restoril), triazolam (Halcion) (Last updated: 11/10/2017) ____________________________________________________________________________________________    

## 2017-12-11 IMAGING — NM NM HEPATO W/GB/PHARM/[PERSON_NAME]
2 series · 12 of 12 positions shown · non-contrast
Comparison: 10/10/12

CLINICAL DATA: Epigastric pain, right upper quadrant pain

EXAM:
NUCLEAR MEDICINE HEPATOBILIARY IMAGING WITH GALLBLADDER EF
TECHNIQUE: Sequential images of the abdomen were obtained [DATE] minutes
following intravenous administration of radiopharmaceutical. After
slow intravenous infusion of 1.7 micrograms Kinevac, gallbladder
ejection fraction was determined.
RADIOPHARMACEUTICALS:  5.2 mCi Zc-RRm Choletec IV

[Series 1000: gallbladder ef · 4.80mm/px · 6 of 120 frames shown]
[frame 11/120]
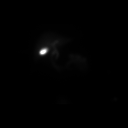
[frame 31/120]
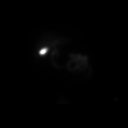
[frame 51/120]
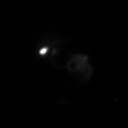
[frame 71/120]
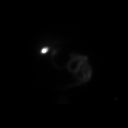
[frame 91/120]
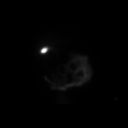
[frame 111/120]
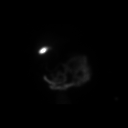

[Series 1000: hepatobiliary scan · 9.59mm/px · 6 of 60 frames shown]
[frame 6/60]
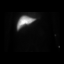
[frame 16/60]
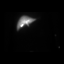
[frame 26/60]
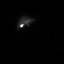
[frame 36/60]
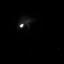
[frame 46/60]
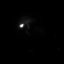
[frame 56/60]
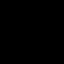

[12 of 12 positions shown; findings below may reference images not displayed]

FINDINGS: There is normal uptake of the tracer by the liver. Gallbladder is
visualized at 20 minutes. CBD is visualized at 30 minutes. Bowel
activity noted at 50 minutes. Post CCK gallbladder ejection fraction
is 37%.. At 60 min, normal ejection fraction is greater than 40%.
IMPRESSION: No cystic duct obstruction. Gallbladder is visualized at 20 minutes.
Post Kinevac administration gallbladder ejection fraction is 37%.
Normal gallbladder ejection fraction should be greater than 40%.

Post Kinevac infusion patient reported about 10 minutes right upper
quadrant pain which subsided.

## 2017-12-12 LAB — TOXASSURE SELECT 13 (MW), URINE

## 2018-03-22 ENCOUNTER — Encounter: Payer: Self-pay | Admitting: Nurse Practitioner

## 2018-03-22 ENCOUNTER — Ambulatory Visit: Payer: Medicare Other | Attending: Nurse Practitioner | Admitting: Nurse Practitioner

## 2018-03-22 ENCOUNTER — Other Ambulatory Visit: Payer: Self-pay

## 2018-03-22 VITALS — BP 140/94 | HR 76 | Temp 98.0°F | Resp 18 | Ht 69.0 in | Wt 185.0 lb

## 2018-03-22 DIAGNOSIS — J45909 Unspecified asthma, uncomplicated: Secondary | ICD-10-CM | POA: Insufficient documentation

## 2018-03-22 DIAGNOSIS — Z7982 Long term (current) use of aspirin: Secondary | ICD-10-CM | POA: Diagnosis not present

## 2018-03-22 DIAGNOSIS — M4802 Spinal stenosis, cervical region: Secondary | ICD-10-CM | POA: Diagnosis not present

## 2018-03-22 DIAGNOSIS — M720 Palmar fascial fibromatosis [Dupuytren]: Secondary | ICD-10-CM | POA: Insufficient documentation

## 2018-03-22 DIAGNOSIS — M4722 Other spondylosis with radiculopathy, cervical region: Secondary | ICD-10-CM | POA: Insufficient documentation

## 2018-03-22 DIAGNOSIS — Z85828 Personal history of other malignant neoplasm of skin: Secondary | ICD-10-CM | POA: Insufficient documentation

## 2018-03-22 DIAGNOSIS — Z8546 Personal history of malignant neoplasm of prostate: Secondary | ICD-10-CM | POA: Diagnosis not present

## 2018-03-22 DIAGNOSIS — Z79899 Other long term (current) drug therapy: Secondary | ICD-10-CM | POA: Diagnosis not present

## 2018-03-22 DIAGNOSIS — M542 Cervicalgia: Secondary | ICD-10-CM | POA: Diagnosis not present

## 2018-03-22 DIAGNOSIS — M549 Dorsalgia, unspecified: Secondary | ICD-10-CM

## 2018-03-22 DIAGNOSIS — Z6827 Body mass index (BMI) 27.0-27.9, adult: Secondary | ICD-10-CM | POA: Insufficient documentation

## 2018-03-22 DIAGNOSIS — G894 Chronic pain syndrome: Secondary | ICD-10-CM | POA: Insufficient documentation

## 2018-03-22 DIAGNOSIS — E7800 Pure hypercholesterolemia, unspecified: Secondary | ICD-10-CM | POA: Insufficient documentation

## 2018-03-22 DIAGNOSIS — G4730 Sleep apnea, unspecified: Secondary | ICD-10-CM | POA: Insufficient documentation

## 2018-03-22 DIAGNOSIS — F329 Major depressive disorder, single episode, unspecified: Secondary | ICD-10-CM | POA: Insufficient documentation

## 2018-03-22 DIAGNOSIS — M792 Neuralgia and neuritis, unspecified: Secondary | ICD-10-CM

## 2018-03-22 DIAGNOSIS — Z79891 Long term (current) use of opiate analgesic: Secondary | ICD-10-CM | POA: Insufficient documentation

## 2018-03-22 DIAGNOSIS — Z5181 Encounter for therapeutic drug level monitoring: Secondary | ICD-10-CM | POA: Insufficient documentation

## 2018-03-22 DIAGNOSIS — M47812 Spondylosis without myelopathy or radiculopathy, cervical region: Secondary | ICD-10-CM | POA: Diagnosis not present

## 2018-03-22 DIAGNOSIS — G8929 Other chronic pain: Secondary | ICD-10-CM

## 2018-03-22 DIAGNOSIS — K219 Gastro-esophageal reflux disease without esophagitis: Secondary | ICD-10-CM | POA: Insufficient documentation

## 2018-03-22 MED ORDER — GABAPENTIN 100 MG PO CAPS: 100.0000 mg | ORAL_CAPSULE | Freq: Four times a day (QID) | ORAL | 0 refills | Status: DC

## 2018-03-22 MED ORDER — OXYCODONE HCL 5 MG PO TABS: 5.0000 mg | ORAL_TABLET | Freq: Three times a day (TID) | ORAL | 0 refills | Status: DC | PRN

## 2018-03-22 MED ORDER — OXYCODONE HCL 5 MG PO TABS
5.0000 mg | ORAL_TABLET | Freq: Three times a day (TID) | ORAL | 0 refills | Status: DC | PRN
Start: 2018-05-21 — End: 2018-06-22

## 2018-03-22 NOTE — Progress Notes (Signed)
Patient's Name: Ricky Mercer  MRN: [id number]  Referring Provider: Sofie Hartigan, MD  DOB: May 12, 1951  PCP: Sofie Hartigan, MD  DOS: 03/22/2018  Note by: Vevelyn Francois NP  Service setting: Ambulatory outpatient  Specialty: Interventional Pain Management  Location: [facility] (AMB) Pain Management Facility    Patient type: Established    Primary Reason(s) for Visit: Encounter for prescription drug management. (Level of risk: moderate)  CC: Pain (all over); Back Pain (low, mid ,upper); and Foot Pain (bilateral burning)  HPI  Mr. Broski is a 67 y.o. year old, male patient, who comes today for a medication management evaluation. He has Hand weakness; Polyradiculopathy; Long term current use of opiate analgesic; Long term prescription opiate use; Opiate use (45 MME/Day); Opiate dependence ([place]); Encounter for therapeutic drug level monitoring; Chronic low back pain  (Location of Primary Pain) (Bilateral) (L>R); Chronic lower extremity pain (Location of Tertiary source of pain) (Bilateral) (L>R); Chronic lumbar radicular pain (Bilateral) (L>R) (Bilateral S1 dermatomal distribution); Chronic hip pain (Location of Secondary source of pain) (Bilateral) (L>R); Chronic upper back pain (Bilateral) (L>R); Chronic neck pain (neck<shoulder) (L>R); Chronic foot pain; Chronic knee pain (Right); Cervical spondylosis; 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); Chronic upper extremity pain (Bilateral) (L>R); Lumbar spondylosis; Cervical facet hypertrophy; Cervical facet syndrome (R>L); History of prostate cancer; Radicular pain of shoulder; Class I Morbid obesity (HCC) (68% higher incidence of chronic low back problems); Peripheral neuropathy (HCC) (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); Lumbar facet syndrome (Bilateral) (L>R); Sleep apnea; Depression; High cholesterol; Low testosterone; History of surgical procedure; Opioid-induced constipation (OIC); Chronic shoulder pain (Bilateral)  (L>R); Breathing orally; Chest pain; Primary cardiomyopathy ([place]); Chronic sacroiliac joint pain (Bilateral) (L>R); Musculoskeletal pain; Pain of right foot; Neurogenic pain; Chest pain with high risk for cardiac etiology; Chronic pain syndrome; Bilateral arm pain; Acute postoperative pain; and Vitamin D insufficiency on their problem list. His primarily concern today is the Pain (all over); Back Pain (low, mid ,upper); and Foot Pain (bilateral burning)  Pain Assessment: Location: Mid, Lower, Upper Back Radiating: feet Onset: More than a month ago Duration: Chronic pain Quality: Burning, Tingling, Constant, Aching Severity: 7 /10 (subjective, self-reported pain score)  Note: Reported level is compatible with observation. Clinically the patient looks like a 2/10 A 2/10 is viewed as "Mild to Moderate" and described as noticeable and distracting. Impossible to hide from other people. More frequent flare-ups. Still possible to adapt and function close to normal. It can be very annoying and may have occasional stronger flare-ups. With discipline, patients may get used to it and adapt.       When using our objective Pain Scale, levels between 6 and 10/10 are said to belong in an emergency room, as it progressively worsens from a 6/10, described as severely limiting, requiring emergency care not usually available at an outpatient pain management facility. At a 6/10 level, communication becomes difficult and requires great effort. Assistance to reach the emergency department may be required. Facial flushing and profuse sweating along with potentially dangerous increases in heart rate and blood pressure will be evident. Effect on ADL:   Timing: Constant Modifying factors: medication BP: (!) 140/94  HR: 76  Mr. Borghi was last scheduled for an appointment on 12/08/2017 for medication management. During today's appointment we reviewed Mr. Harpole chronic pain status, as well as his outpatient medication  regimen. He states that he is hurting all over. He his having shooting pain that he feels all the time.  He states that sometimes it feels like it is burning. He admits that the shooting pain is in his arms, legs and feet. He has failed all the interventional therapy.  The patient  reports that he does not use drugs. His body mass index is 27.32 kg/m.  Further details on both, my assessment(s), as well as the proposed treatment plan, please see below.  Controlled Substance Pharmacotherapy Assessment REMS (Risk Evaluation and Mitigation Strategy)  Analgesic:Oxycodone IR 6m every 8 hours (178mday) MME/day:22.5m78may.    ShaHart RochesterN  03/22/2018 11:21 AM  Sign at close encounter Nursing Pain Medication Assessment:  Safety precautions to be maintained throughout the outpatient stay will include: orient to surroundings, keep bed in low position, maintain call bell within reach at all times, provide assistance with transfer out of bed and ambulation.  Medication Inspection Compliance: Pill count conducted under aseptic conditions, in front of the patient. Neither the pills nor the bottle was removed from the patient's sight at any time. Once count was completed pills were immediately returned to the patient in their original bottle.  Medication: Oxycodone IR Pill/Patch Count: 23 of 90 pills remain Pill/Patch Appearance: Markings consistent with prescribed medication Bottle Appearance: Standard pharmacy container. Clearly labeled. Filled Date: 06 / 10 / 2019 Last Medication intake:  Today   Pharmacokinetics: Liberation and absorption (onset of action): WNL Distribution (time to peak effect): WNL Metabolism and excretion (duration of action): WNL         Pharmacodynamics: Desired effects: Analgesia: Mr. LunLardizabalports >50% benefit. Functional ability: Patient reports that medication allows him to accomplish basic ADLs Clinically meaningful improvement in function (CMIF):  Sustained CMIF goals met Perceived effectiveness: Described as relatively effective, allowing for increase in activities of daily living (ADL) Undesirable effects: Side-effects or Adverse reactions: None reported Monitoring: [place] PMP: Online review of the past 12-66-monthiod conducted. Compliant with practice rules and regulations Last UDS on record: Summary  Date Value Ref Range Status  12/08/2017 FINAL  Final    Comment:    ==================================================================== TOXASSURE SELECT 13 (MW) ==================================================================== Test                             Result       Flag       Units Drug Present and Declared for Prescription Verification   Oxycodone                      489          EXPECTED   ng/mg creat   Oxymorphone                    651          EXPECTED   ng/mg creat   Noroxycodone                   583          EXPECTED   ng/mg creat   Noroxymorphone                 268          EXPECTED   ng/mg creat    Sources of oxycodone are scheduled prescription medications.    Oxymorphone, noroxycodone, and noroxymorphone are expected    metabolites of oxycodone. Oxymorphone is also available as a    scheduled prescription medication. ==================================================================== Test  Result    Flag   Units      Ref Range   Creatinine              218              mg/dL      >=20 ==================================================================== Declared Medications:  The flagging and interpretation on this report are based on the  following declared medications.  Unexpected results may arise from  inaccuracies in the declared medications.  **Note: The testing scope of this panel includes these medications:  Oxycodone  **Note: The testing scope of this panel does not include following  reported medications:  Aspirin (Aspirin 81)  Cefuroxime axetil  Cholecalciferol   Gabapentin  Pantoprazole  Simvastatin  Testosterone ==================================================================== For clinical consultation, please call [phone]. ====================================================================    UDS interpretation: Compliant          Medication Assessment Form: Reviewed. Patient indicates being compliant with therapy Treatment compliance: Compliant Risk Assessment Profile: Aberrant behavior: See prior evaluations. None observed or detected today Comorbid factors increasing risk of overdose: See prior notes. No additional risks detected today Risk of substance use disorder (SUD): Low Opioid Risk Tool - 03/22/18 1129      Family History of Substance Abuse   Alcohol  Positive Male    Illegal Drugs  Negative    Rx Drugs  Negative      Personal History of Substance Abuse   Alcohol  Negative    Illegal Drugs  Negative    Rx Drugs  Negative      Age   Age between 33-45 years   No      History of Preadolescent Sexual Abuse   History of Preadolescent Sexual Abuse  Negative or Male      Psychological Disease   Psychological Disease  Negative    Depression  Negative      Total Score   Opioid Risk Tool Scoring  3    Opioid Risk Interpretation  Low Risk      ORT Scoring interpretation table:  Score <3 = Low Risk for SUD  Score between 4-7 = Moderate Risk for SUD  Score >8 = High Risk for Opioid Abuse   Risk Mitigation Strategies:  Patient Counseling: Covered Patient-Prescriber Agreement (PPA): Present and active  Notification to other healthcare providers: Done  Pharmacologic Plan: No change in therapy, at this time.             Laboratory Chemistry  Inflammation Markers (CRP: Acute Phase) (ESR: Chronic Phase) Lab Results  Component Value Date   CRP <0.5 08/21/2015   ESRSEDRATE 4 08/21/2015                         Rheumatology Markers No results found for: RF, ANA, LABURIC, URICUR, LYMEIGGIGMAB, LYMEABIGMQN,  HLAB27                      Renal Function Markers Lab Results  Component Value Date   BUN 13 11/25/2015   CREATININE 0.96 11/25/2015   GFRAA >60 11/25/2015   GFRNONAA >60 11/25/2015                             Hepatic Function Markers Lab Results  Component Value Date   AST 31 11/25/2015   ALT 41 11/25/2015   ALBUMIN 3.9 11/25/2015   ALKPHOS 71 11/25/2015  Electrolytes Lab Results  Component Value Date   NA 138 11/25/2015   K 4.2 11/25/2015   CL 104 11/25/2015   CALCIUM 9.0 11/25/2015   MG 2.1 08/21/2015                        Neuropathy Markers No results found for: VITAMINB12, FOLATE, HGBA1C, HIV                      Bone Pathology Markers No results found for: VD25OH, VD125OH2TOT, TI4580DX8, PJ8250NL9, 25OHVITD1, 25OHVITD2, 25OHVITD3, TESTOFREE, TESTOSTERONE                       Coagulation Parameters Lab Results  Component Value Date   PLT 238 11/25/2015                        Cardiovascular Markers Lab Results  Component Value Date   HGB 14.6 11/25/2015   HCT 43.5 11/25/2015                         CA Markers No results found for: CEA, CA125, LABCA2                      Note: Lab results reviewed.  Recent Diagnostic Imaging Results  CT ABDOMEN PELVIS W CONTRAST CLINICAL DATA:  Abdominal pain for approximately 1 month. History of prostate carcinoma  EXAM: CT ABDOMEN AND PELVIS WITH CONTRAST  TECHNIQUE: Multidetector CT imaging of the abdomen and pelvis was performed using the standard protocol following bolus administration of intravenous contrast. Oral contrast was also administered.  CONTRAST:  21m ISOVUE-370 IOPAMIDOL (ISOVUE-370) INJECTION 76%  COMPARISON:  September 05, 2015  FINDINGS: Lower chest: Lung bases are clear.  Hepatobiliary: No focal liver lesions are apparent. Gallbladder is absent. There is no biliary duct dilatation.  Pancreas: There are areas of fatty infiltration within the pancreas. There  is no pancreatic mass or inflammatory focus.  Spleen: No splenic lesions are evident.  Adrenals/Urinary Tract: Adrenals bilaterally appear normal. There is a parapelvic cyst in the right kidney measuring 1.5 x 1.5 cm. There are scattered subcentimeter cysts in each kidney as well. There is no appreciable hydronephrosis on either side. There is no evident renal or ureteral calculus on either side. Urinary bladder is midline with wall thickness within normal limits.  Stomach/Bowel: There are multiple sigmoid diverticula without diverticulitis. There is felt to be a degree of muscular hypertrophy in the sigmoid colon from chronic diverticulosis. Elsewhere there is no bowel wall thickening. No mesenteric thickening. No evident bowel obstruction. No free air or portal venous air.  Vascular/Lymphatic: There is no abdominal aortic aneurysm. No vascular lesions are evident on this study. Major mesenteric vessels appear widely patent. No adenopathy is appreciable in the abdomen or pelvis.  Reproductive: There are seed implants in the prostate. Prostate and seminal vesicles appear normal in size and contour. There is no evident pelvic mass.  Other: Appendix appears normal. There is no ascites or abscess in the abdomen or pelvis. There is postoperative change along the wall of the anterior left pelvis with a left inguinal hernia repair. No appreciable hernia present currently.  Musculoskeletal: There are no blastic or lytic bone lesions. There is no intramuscular or abdominal wall lesion evident.  IMPRESSION: 1. Sigmoid diverticula. Mild wall thickening in the sigmoid colon is felt to be due  to muscular hypertrophy from chronic diverticulosis. No frank diverticulitis evident.  2.  No bowel obstruction.  No abscess.  Appendix appears normal.  3. No appreciable renal or ureteral calculus. No hydronephrosis. No urinary bladder wall thickening evident.  4. Seed implants in prostate.  Prostate and seminal vesicles normal in size and contour.  5.  Gallbladder absent.  Prior left inguinal hernia repair.  Electronically Signed   By: Lowella Grip III M.D.   On: 09/07/2017 12:01  Complexity Note: Imaging results reviewed. Results shared with Mr. Fickle, using Layman's terms.                         Meds   Current Outpatient Medications:  .  ANDROGEL PUMP 20.25 MG/ACT (1.62%) GEL, APPLY TO 2 PUMPS ONCE A DAY AS DIRECTED, Disp: , Rfl: 5 .  aspirin 81 MG tablet, Take 81 mg by mouth daily., Disp: , Rfl:  .  Cholecalciferol 5000 units capsule, Take 1 capsule (5,000 Units total) by mouth daily., Disp: 90 capsule, Rfl: 0 .  [START ON 05/21/2018] oxyCODONE (OXY IR/ROXICODONE) 5 MG immediate release tablet, Take 1 tablet (5 mg total) by mouth every 8 (eight) hours as needed for severe pain., Disp: 90 tablet, Rfl: 0 .  pantoprazole (PROTONIX) 40 MG tablet, Take 40 mg by mouth daily., Disp: , Rfl:  .  simvastatin (ZOCOR) 10 MG tablet, Take 10 mg by mouth at bedtime. , Disp: , Rfl:  .  [START ON 04/16/2018] gabapentin (NEURONTIN) 100 MG capsule, Take 1-3 capsules (100-300 mg total) by mouth 4 (four) times daily. Follow written titration schedule., Disp: 360 capsule, Rfl: 0 .  [START ON 06/20/2018] oxyCODONE (OXY IR/ROXICODONE) 5 MG immediate release tablet, Take 1 tablet (5 mg total) by mouth every 8 (eight) hours as needed for severe pain., Disp: 90 tablet, Rfl: 0 .  [START ON 04/21/2018] oxyCODONE (OXY IR/ROXICODONE) 5 MG immediate release tablet, Take 1 tablet (5 mg total) by mouth every 8 (eight) hours as needed for severe pain., Disp: 90 tablet, Rfl: 0  ROS  Constitutional: Denies any fever or chills Gastrointestinal: No reported hemesis, hematochezia, vomiting, or acute GI distress Musculoskeletal: Denies any acute onset joint swelling, redness, loss of ROM, or weakness Neurological: No reported episodes of acute onset apraxia, aphasia, dysarthria, agnosia, amnesia, paralysis,  loss of coordination, or loss of consciousness  Allergies  Mr. Erway has No Known Allergies.  PFSH  Drug: Mr. Bazinet  reports that he does not use drugs. Alcohol:  reports that he does not drink alcohol. Tobacco:  reports that he has never smoked. He has never used smokeless tobacco. Medical:  has a past medical history of Bilateral arm pain, Cardiomyopathy ([employer]), Diverticulosis, Dupuytren's contracture, Erosive gastritis, Foot pain, bilateral, GERD (gastroesophageal reflux disease), Hand weakness, Hyperlipidemia, Knee pain, Midline low back pain, Osteoarthritis, Pain in neck, Prostate cancer ([place]), Reactive airway disease, Seasonal allergies, Situational anxiety, and Sleep apnea. Surgical: Mr. Gallon  has a past surgical history that includes Nose surgery; Hand surgery (Bilateral); Colonoscopy (10/24/2012); Upper gi endoscopy (12/18/2012); Tonsillectomy (2009); Skin cancer excision (Left, 2015); Mass excision (Right, 07/18/2015); Esophagogastroduodenoscopy (egd) with propofol (N/A, 10/28/2015); prostate seeding; Hernia repair; Cholecystectomy (N/A, 12/02/2015); Esophagogastroduodenoscopy (N/A, 11/17/2017); and Colonoscopy with propofol (N/A, 11/17/2017). Family: family history includes Colon cancer in his brother; Hyperlipidemia in his sister; Lung cancer in his father; Prostate cancer in his brother.  Constitutional Exam  General appearance: Well nourished, well developed, and well hydrated. In no apparent  acute distress Vitals:   03/22/18 1123  BP: (!) 140/94  Pulse: 76  Resp: 18  Temp: 98 F (36.7 C)  TempSrc: Oral  SpO2: 100%  Weight: 185 lb (83.9 kg)  Height: 5' 9"  (1.753 m)  Psych/Mental status: Alert, oriented x 3 (person, place, & time)       Eyes: PERLA Respiratory: No evidence of acute respiratory distress  Cervical Spine Area Exam  Skin & Axial Inspection: No masses, redness, edema, swelling, or associated skin lesions Alignment: Symmetrical Functional ROM: Unrestricted ROM       Stability: No instability detected Muscle Tone/Strength: Functionally intact. No obvious neuro-muscular anomalies detected. Sensory (Neurological): Unimpaired Palpation: No palpable anomalies              Upper Extremity (UE) Exam    Side: Right upper extremity  Side: Left upper extremity  Skin & Extremity Inspection: Skin color, temperature, and hair growth are WNL. No peripheral edema or cyanosis. No masses, redness, swelling, asymmetry, or associated skin lesions. No contractures.  Skin & Extremity Inspection: Skin color, temperature, and hair growth are WNL. No peripheral edema or cyanosis. No masses, redness, swelling, asymmetry, or associated skin lesions. No contractures.  Functional ROM: Unrestricted ROM          Functional ROM: Unrestricted ROM          Muscle Tone/Strength: Functionally intact. No obvious neuro-muscular anomalies detected.  Muscle Tone/Strength: Functionally intact. No obvious neuro-muscular anomalies detected.  Sensory (Neurological): Dermatomal pain pattern          Sensory (Neurological): Unimpaired          Palpation: No palpable anomalies              Palpation: No palpable anomalies              Provocative Test(s):  Phalen's test: deferred Tinel's test: deferred Apley's scratch test (touch opposite shoulder):  Action 1 (Across chest): deferred Action 2 (Overhead): deferred Action 3 (LB reach): deferred   Provocative Test(s):  Phalen's test: deferred Tinel's test: deferred Apley's scratch test (touch opposite shoulder):  Action 1 (Across chest): deferred Action 2 (Overhead): deferred Action 3 (LB reach): deferred    Thoracic Spine Area Exam  Skin & Axial Inspection: No masses, redness, or swelling Alignment: Symmetrical Functional ROM: Unrestricted ROM Stability: No instability detected Muscle Tone/Strength: Functionally intact. No obvious neuro-muscular anomalies detected. Sensory (Neurological): Unimpaired Muscle strength & Tone:  Tender   Gait & Posture Assessment  Ambulation: Unassisted Gait: Relatively normal for age and body habitus Posture: WNL    Assessment  Primary Diagnosis & Pertinent Problem List: The primary encounter diagnosis was Osteoarthritis of spine with radiculopathy, cervical region. Diagnoses of Chronic upper back pain (Bilateral) (L>R), Cervical facet syndrome (R>L), Chronic neck pain (neck<shoulder) (L>R), Chronic pain syndrome, and Neurogenic pain were also pertinent to this visit.  Status Diagnosis  Worsening Worsening Worsening 1. Osteoarthritis of spine with radiculopathy, cervical region   2. Chronic upper back pain (Bilateral) (L>R)   3. Cervical facet syndrome (R>L)   4. Chronic neck pain (neck<shoulder) (L>R)   5. Chronic pain syndrome   6. Neurogenic pain     Problems updated and reviewed during this visit: No problems updated. Plan of Care  Pharmacotherapy (Medications Ordered): Meds ordered this encounter  Medications  . oxyCODONE (OXY IR/ROXICODONE) 5 MG immediate release tablet    Sig: Take 1 tablet (5 mg total) by mouth every 8 (eight) hours as needed for severe pain.  Dispense:  90 tablet    Refill:  0    Do not add this medication to the electronic "Automatic Refill" notification system. Patient may have prescription filled one day early if pharmacy is closed on scheduled refill date. Do not fill until:06/20/2018 To last until: 07/20/2018    Order Specific Question:   Supervising Provider    Answer:   Milinda Pointer [phone]  . oxyCODONE (OXY IR/ROXICODONE) 5 MG immediate release tablet    Sig: Take 1 tablet (5 mg total) by mouth every 8 (eight) hours as needed for severe pain.    Dispense:  90 tablet    Refill:  0    Do not add this medication to the electronic "Automatic Refill" notification system. Patient may have prescription filled one day early if pharmacy is closed on scheduled refill date. Do not fill until:05/21/2018 To last until: 06/20/2018     Order Specific Question:   Supervising Provider    Answer:   Milinda Pointer [phone]  . oxyCODONE (OXY IR/ROXICODONE) 5 MG immediate release tablet    Sig: Take 1 tablet (5 mg total) by mouth every 8 (eight) hours as needed for severe pain.    Dispense:  90 tablet    Refill:  0    Do not add this medication to the electronic "Automatic Refill" notification system. Patient may have prescription filled one day early if pharmacy is closed on scheduled refill date. Do not fill until: 04/21/2018 To last until:05/21/2018    Order Specific Question:   Supervising Provider    Answer:   Milinda Pointer [phone]  . gabapentin (NEURONTIN) 100 MG capsule    Sig: Take 1-3 capsules (100-300 mg total) by mouth 4 (four) times daily. Follow written titration schedule.    Dispense:  360 capsule    Refill:  0    Do not place this medication, or any other prescription from our practice, on "Automatic Refill". Patient may have prescription filled one day early if pharmacy is closed on scheduled refill date.    Order Specific Question:   Supervising Provider    Answer:   Milinda Pointer [[id number]]   New Prescriptions   No medications on file   Medications administered today: Delane Ginger had no medications administered during this visit. Lab-work, procedure(s), and/or referral(s): Orders Placed This Encounter  Procedures  . MR THORACIC SPINE WO CONTRAST  . DG Cervical Spine With Flex & Extend   Imaging and/or referral(s): MR THORACIC SPINE WO CONTRAST DG CERVICAL SPINE WITH FLEX & EXTEND  Interventional therapies: Planned, scheduled, and/or pending:  Images as above with follow up   Considering:  Palliative Left cervical epidural steroid injection  Possible bilateral sacroiliac joint + lumbar facet RFA Diagnostic Bilateral HipBlock Diagnostic bilateral femoral nerve and obturator nerve block Possible bilateral femoral nerve and obturator nerve RFA Diagnostic bilateral cervical facet  block. Possible bilateral cervical facet RFA. Diagnostic Caudal ESI + diagnostic epidurogram Diagnostic Right Knee injection. Possible series of 5 right-sided intra-articular [place] injections Diagnostic right Genicular NB Possible right Genicular Nerve RFA. Diagnostic bilateral intra-articular shoulderinjection Diagnostic bilateral suprascapular NB. Possible bilateral suprascapular Nerve RFA.   Palliative PRN treatment(s):  Diagnostic Bilateral Lumbar Facet & Bilateral S-I joint block Diagnostic Bilateral Hip Block Diagnostic bilateral cervical facetblock. Diagnostic Caudal ESI. Diagnostic Left CESI. Diagnostic Right Knee injection. Diagnostic right Genicular NB Diagnostic bilateral intra-articular shoulderinj. Diagnostic bilateral suprascapular NB    Provider-requested follow-up: Return in about 3 months (around 06/22/2018) for MedMgmt with Me Donella Stade Edison Pace),  MRI, F/U eval follow MRI poss change need in treatment.  Future Appointments  Date Time Provider [place]  06/22/2018 11:30 AM Vevelyn Francois, NP [facility] None   Primary Care Physician: Sofie Hartigan, MD Location: [facility] Note by: Vevelyn Francois NP Date: 03/22/2018; Time: 8:47 AM  Pain Score Disclaimer: We use the NRS-11 scale. This is a self-reported, subjective measurement of pain severity with only modest accuracy. It is used primarily to identify changes within a particular patient. It must be understood that outpatient pain scales are significantly less accurate that those used for research, where they can be applied under ideal controlled circumstances with minimal exposure to variables. In reality, the score is likely to be a combination of pain intensity and pain affect, where pain affect describes the degree of emotional arousal or changes in action readiness caused by the sensory experience of pain. Factors such as social and work situation, setting,  emotional state, anxiety levels, expectation, and prior pain experience may influence pain perception and show large inter-individual differences that may also be affected by time variables.  Patient instructions provided during this appointment: Patient Instructions   You have been given 3 Rx for Oxycodone IR to last until 07/20/2018. Rx for gabapentin has been escribed to your pharmacy.____________________________________________________________________________________________  Medication Rules  Applies to: All patients receiving prescriptions (written or electronic).  Pharmacy of record: Pharmacy where electronic prescriptions will be sent. If written prescriptions are taken to a different pharmacy, please inform the nursing staff. The pharmacy listed in the electronic medical record should be the one where you would like electronic prescriptions to be sent.  Prescription refills: Only during scheduled appointments. Applies to both, written and electronic prescriptions.  NOTE: The following applies primarily to controlled substances (Opioid* Pain Medications).   Patient's responsibilities: 1. Pain Pills: Bring all pain pills to every appointment (except for procedure appointments). 2. Pill Bottles: Bring pills in original pharmacy bottle. Always bring newest bottle. Bring bottle, even if empty. 3. Medication refills: You are responsible for knowing and keeping track of what medications you need refilled. The day before your appointment, write a list of all prescriptions that need to be refilled. Bring that list to your appointment and give it to the admitting nurse. Prescriptions will be written only during appointments. If you forget a medication, it will not be "Called in", "Faxed", or "electronically sent". You will need to get another appointment to get these prescribed. 4. Prescription Accuracy: You are responsible for carefully inspecting your prescriptions before leaving our office.  Have the discharge nurse carefully go over each prescription with you, before taking them home. Make sure that your name is accurately spelled, that your address is correct. Check the name and dose of your medication to make sure it is accurate. Check the number of pills, and the written instructions to make sure they are clear and accurate. Make sure that you are given enough medication to last until your next medication refill appointment. 5. Taking Medication: Take medication as prescribed. Never take more pills than instructed. Never take medication more frequently than prescribed. Taking less pills or less frequently is permitted and encouraged, when it comes to controlled substances (written prescriptions).  6. Inform other Doctors: Always inform, all of your healthcare providers, of all the medications you take. 7. Pain Medication from other Providers: You are not allowed to accept any additional pain medication from any other Doctor or Healthcare provider. There are two exceptions to this  rule. (see below) In the event that you require additional pain medication, you are responsible for notifying us, as stated below. 8. Medication Agreement: You are responsible for carefully reading and following our Medication Agreement. This must be signed before receiving any prescriptions from our practice. Safely store a copy of your signed Agreement. Violations to the Agreement will result in no further prescriptions. (Additional copies of our Medication Agreement are available upon request.) 9. Laws, Rules, & Regulations: All patients are expected to follow all Federal and [employer], [employer], Rules, [employer]. Ignorance of the Laws does not constitute a valid excuse. The use of any illegal substances is prohibited. 10. Adopted CDC guidelines & recommendations: Target dosing levels will be at or below 60 MME/day. Use of benzodiazepines** is not recommended.  Exceptions: There are only two exceptions to  the rule of not receiving pain medications from other Healthcare Providers. 1. Exception #1 (Emergencies): In the event of an emergency (i.e.: accident requiring emergency care), you are allowed to receive additional pain medication. However, you are responsible for: As soon as you are able, call our office [phone], at any time of the day or night, and leave a message stating your name, the date and nature of the emergency, and the name and dose of the medication prescribed. In the event that your call is answered by a member of our staff, make sure to document and save the date, time, and the name of the person that took your information.  2. Exception #2 (Planned Surgery): In the event that you are scheduled by another doctor or dentist to have any type of surgery or procedure, you are allowed (for a period no longer than 30 days), to receive additional pain medication, for the acute post-op pain. However, in this case, you are responsible for picking up a copy of our "Post-op Pain Management for Surgeons" handout, and giving it to your surgeon or dentist. This document is available at our office, and does not require an appointment to obtain it. Simply go to our office during business hours (Monday-Thursday from 8:00 AM to 4:00 PM) (Friday 8:00 AM to 12:00 Noon) or if you have a scheduled appointment with [place], prior to your surgery, and ask for it by name. In addition, you will need to provide [place] with your name, name of your surgeon, type of surgery, and date of procedure or surgery.  *Opioid medications include: morphine, codeine, oxycodone, oxymorphone, hydrocodone, hydromorphone, meperidine, tramadol, tapentadol, buprenorphine, fentanyl, methadone. **Benzodiazepine medications include: diazepam (Valium), alprazolam (Xanax), clonazepam (Klonopine), lorazepam (Ativan), clorazepate (Tranxene), chlordiazepoxide (Librium), estazolam (Prosom), oxazepam (Serax), temazepam (Restoril), triazolam  (Halcion) (Last updated: 11/10/2017) ____________________________________________________________________________________________   BMI Assessment: Estimated body mass index is 27.32 kg/m as calculated from the following:   Height as of this encounter: 5' 9"  (1.753 m).   Weight as of this encounter: 185 lb (83.9 kg).  BMI interpretation table: BMI level Category Range association with higher incidence of chronic pain  <18 kg/m2 Underweight   18.5-24.9 kg/m2 Ideal body weight   25-29.9 kg/m2 Overweight Increased incidence by 20%  30-34.9 kg/m2 Obese (Class I) Increased incidence by 68%  35-39.9 kg/m2 Severe obesity (Class II) Increased incidence by 136%  >40 kg/m2 Extreme obesity (Class III) Increased incidence by 254%   Patient's current BMI Ideal Body weight  Body mass index is 27.32 kg/m. Ideal body weight: 70.7 kg (155 lb 13.8 oz) Adjusted ideal body weight: 76 kg (167 lb 8.3 oz)   BMI Readings from  Last 4 Encounters:  03/22/18 27.32 kg/m  12/08/17 27.32 kg/m  11/17/17 28.13 kg/m  09/15/17 27.32 kg/m   Wt Readings from Last 4 Encounters:  03/22/18 185 lb (83.9 kg)  12/08/17 185 lb (83.9 kg)  11/17/17 185 lb (83.9 kg)  09/15/17 185 lb (83.9 kg)   ____________________________________________________________________________________________  CANNABIDIOL (AKA: CBD Oil or Pills)  Applies to: All patients receiving prescriptions of controlled substances (written and/or electronic).  General Information: Cannabidiol (CBD) was discovered in 1. It is one of some 113 identified cannabinoids in cannabis (Marijuana) plants, accounting for up to 40% of the plant's extract. As of 2018, preliminary clinical research on cannabidiol included studies of anxiety, cognition, movement disorders, and pain.  Cannabidiol is consummed in multiple ways, including inhalation of cannabis smoke or vapor, as an aerosol spray into the cheek, and by mouth. It may be supplied as CBD oil  containing CBD as the active ingredient (no added tetrahydrocannabinol (THC) or terpenes), a full-plant CBD-dominant hemp extract oil, capsules, dried cannabis, or as a liquid solution. CBD is thought not have the same psychoactivity as THC, and may affect the actions of THC. Studies suggest that CBD may interact with different biological targets, including cannabinoid receptors and other neurotransmitter receptors. As of 2018 the mechanism of action for its biological effects has not been determined.  In the [place], cannabidiol has a limited approval by the Food and Drug Administration (FDA) for treatment of only two types of epilepsy disorders. The side effects of long-term use of the drug include somnolence, decreased appetite, diarrhea, fatigue, malaise, weakness, sleeping problems, and others.  CBD remains a Schedule I drug prohibited for any use.  Legality: Some manufacturers ship CBD products nationally, an illegal action which the FDA has not enforced in 2018, with CBD remaining the subject of an FDA investigational new drug evaluation, and is not considered legal as a dietary supplement or food ingredient as of December 2018. Federal illegality has made it difficult historically to conduct research on CBD. CBD is openly sold in head shops and health food stores in some states where such sales have not been explicitly legalized.  Warning: Because it is not FDA approved for general use or treatment of pain, it is not required to undergo the same manufacturing controls as prescription drugs.  This means that the available cannabidiol (CBD) may be contaminated with THC.  If this is the case, it will trigger a positive urine drug screen (UDS) test for cannabinoids (Marijuana).  Because a positive UDS for illicit substances is a violation of our medication agreement, your opioid analgesics (pain medicine) may be permanently discontinued. (Last update:  12/01/2017) ____________________________________________________________________________________________

## 2018-03-22 NOTE — Progress Notes (Signed)
Nursing Pain Medication Assessment:  Safety precautions to be maintained throughout the outpatient stay will include: orient to surroundings, keep bed in low position, maintain call bell within reach at all times, provide assistance with transfer out of bed and ambulation.  Medication Inspection Compliance: Pill count conducted under aseptic conditions, in front of the patient. Neither the pills nor the bottle was removed from the patient's sight at any time. Once count was completed pills were immediately returned to the patient in their original bottle.  Medication: Oxycodone IR Pill/Patch Count: 23 of 90 pills remain Pill/Patch Appearance: Markings consistent with prescribed medication Bottle Appearance: Standard pharmacy container. Clearly labeled. Filled Date: 06 / 10 / 2019 Last Medication intake:  Today

## 2018-03-22 NOTE — Patient Instructions (Addendum)
You have been given 3 Rx for Oxycodone IR to last until 07/20/2018. Rx for gabapentin has been escribed to your pharmacy.____________________________________________________________________________________________  Medication Rules  Applies to: All patients receiving prescriptions (written or electronic).  Pharmacy of record: Pharmacy where electronic prescriptions will be sent. If written prescriptions are taken to a different pharmacy, please inform the nursing staff. The pharmacy listed in the electronic medical record should be the one where you would like electronic prescriptions to be sent.  Prescription refills: Only during scheduled appointments. Applies to both, written and electronic prescriptions.  NOTE: The following applies primarily to controlled substances (Opioid* Pain Medications).   Patient's responsibilities: 1. Pain Pills: Bring all pain pills to every appointment (except for procedure appointments). 2. Pill Bottles: Bring pills in original pharmacy bottle. Always bring newest bottle. Bring bottle, even if empty. 3. Medication refills: You are responsible for knowing and keeping track of what medications you need refilled. The day before your appointment, write a list of all prescriptions that need to be refilled. Bring that list to your appointment and give it to the admitting nurse. Prescriptions will be written only during appointments. If you forget a medication, it will not be "Called in", "Faxed", or "electronically sent". You will need to get another appointment to get these prescribed. 4. Prescription Accuracy: You are responsible for carefully inspecting your prescriptions before leaving our office. Have the discharge nurse carefully go over each prescription with you, before taking them home. Make sure that your name is accurately spelled, that your address is correct. Check the name and dose of your medication to make sure it is accurate. Check the number of pills, and  the written instructions to make sure they are clear and accurate. Make sure that you are given enough medication to last until your next medication refill appointment. 5. Taking Medication: Take medication as prescribed. Never take more pills than instructed. Never take medication more frequently than prescribed. Taking less pills or less frequently is permitted and encouraged, when it comes to controlled substances (written prescriptions).  6. Inform other Doctors: Always inform, all of your healthcare providers, of all the medications you take. 7. Pain Medication from other Providers: You are not allowed to accept any additional pain medication from any other Doctor or Healthcare provider. There are two exceptions to this rule. (see below) In the event that you require additional pain medication, you are responsible for notifying us, as stated below. 8. Medication Agreement: You are responsible for carefully reading and following our Medication Agreement. This must be signed before receiving any prescriptions from our practice. Safely store a copy of your signed Agreement. Violations to the Agreement will result in no further prescriptions. (Additional copies of our Medication Agreement are available upon request.) 9. Laws, Rules, & Regulations: All patients are expected to follow all Federal and [employer], [employer], Rules, [employer]. Ignorance of the Laws does not constitute a valid excuse. The use of any illegal substances is prohibited. 10. Adopted CDC guidelines & recommendations: Target dosing levels will be at or below 60 MME/day. Use of benzodiazepines** is not recommended.  Exceptions: There are only two exceptions to the rule of not receiving pain medications from other Healthcare Providers. 1. Exception #1 (Emergencies): In the event of an emergency (i.e.: accident requiring emergency care), you are allowed to receive additional pain medication. However, you are responsible for: As soon as  you are able, call our office [phone], at any time of the day or night, and  leave a message stating your name, the date and nature of the emergency, and the name and dose of the medication prescribed. In the event that your call is answered by a member of our staff, make sure to document and save the date, time, and the name of the person that took your information.  2. Exception #2 (Planned Surgery): In the event that you are scheduled by another doctor or dentist to have any type of surgery or procedure, you are allowed (for a period no longer than 30 days), to receive additional pain medication, for the acute post-op pain. However, in this case, you are responsible for picking up a copy of our "Post-op Pain Management for Surgeons" handout, and giving it to your surgeon or dentist. This document is available at our office, and does not require an appointment to obtain it. Simply go to our office during business hours (Monday-Thursday from 8:00 AM to 4:00 PM) (Friday 8:00 AM to 12:00 Noon) or if you have a scheduled appointment with [place], prior to your surgery, and ask for it by name. In addition, you will need to provide [place] with your name, name of your surgeon, type of surgery, and date of procedure or surgery.  *Opioid medications include: morphine, codeine, oxycodone, oxymorphone, hydrocodone, hydromorphone, meperidine, tramadol, tapentadol, buprenorphine, fentanyl, methadone. **Benzodiazepine medications include: diazepam (Valium), alprazolam (Xanax), clonazepam (Klonopine), lorazepam (Ativan), clorazepate (Tranxene), chlordiazepoxide (Librium), estazolam (Prosom), oxazepam (Serax), temazepam (Restoril), triazolam (Halcion) (Last updated: 11/10/2017) ____________________________________________________________________________________________   BMI Assessment: Estimated body mass index is 27.32 kg/m as calculated from the following:   Height as of this encounter: 5\' 9"  (1.753 m).   Weight as of  this encounter: 185 lb (83.9 kg).  BMI interpretation table: BMI level Category Range association with higher incidence of chronic pain  <18 kg/m2 Underweight   18.5-24.9 kg/m2 Ideal body weight   25-29.9 kg/m2 Overweight Increased incidence by 20%  30-34.9 kg/m2 Obese (Class I) Increased incidence by 68%  35-39.9 kg/m2 Severe obesity (Class II) Increased incidence by 136%  >40 kg/m2 Extreme obesity (Class III) Increased incidence by 254%   Patient's current BMI Ideal Body weight  Body mass index is 27.32 kg/m. Ideal body weight: 70.7 kg (155 lb 13.8 oz) Adjusted ideal body weight: 76 kg (167 lb 8.3 oz)   BMI Readings from Last 4 Encounters:  03/22/18 27.32 kg/m  12/08/17 27.32 kg/m  11/17/17 28.13 kg/m  09/15/17 27.32 kg/m   Wt Readings from Last 4 Encounters:  03/22/18 185 lb (83.9 kg)  12/08/17 185 lb (83.9 kg)  11/17/17 185 lb (83.9 kg)  09/15/17 185 lb (83.9 kg)   ____________________________________________________________________________________________  CANNABIDIOL (AKA: CBD Oil or Pills)  Applies to: All patients receiving prescriptions of controlled substances (written and/or electronic).  General Information: Cannabidiol (CBD) was discovered in 28. It is one of some 113 identified cannabinoids in cannabis (Marijuana) plants, accounting for up to 40% of the plant's extract. As of 2018, preliminary clinical research on cannabidiol included studies of anxiety, cognition, movement disorders, and pain.  Cannabidiol is consummed in multiple ways, including inhalation of cannabis smoke or vapor, as an aerosol spray into the cheek, and by mouth. It may be supplied as CBD oil containing CBD as the active ingredient (no added tetrahydrocannabinol (THC) or terpenes), a full-plant CBD-dominant hemp extract oil, capsules, dried cannabis, or as a liquid solution. CBD is thought not have the same psychoactivity as THC, and may affect the actions of THC. Studies suggest that CBD  may interact  with different biological targets, including cannabinoid receptors and other neurotransmitter receptors. As of 2018 the mechanism of action for its biological effects has not been determined.  In the [place], cannabidiol has a limited approval by the Food and Drug Administration (FDA) for treatment of only two types of epilepsy disorders. The side effects of long-term use of the drug include somnolence, decreased appetite, diarrhea, fatigue, malaise, weakness, sleeping problems, and others.  CBD remains a Schedule I drug prohibited for any use.  Legality: Some manufacturers ship CBD products nationally, an illegal action which the FDA has not enforced in 2018, with CBD remaining the subject of an FDA investigational new drug evaluation, and is not considered legal as a dietary supplement or food ingredient as of December 2018. Federal illegality has made it difficult historically to conduct research on CBD. CBD is openly sold in head shops and health food stores in some states where such sales have not been explicitly legalized.  Warning: Because it is not FDA approved for general use or treatment of pain, it is not required to undergo the same manufacturing controls as prescription drugs.  This means that the available cannabidiol (CBD) may be contaminated with THC.  If this is the case, it will trigger a positive urine drug screen (UDS) test for cannabinoids (Marijuana).  Because a positive UDS for illicit substances is a violation of our medication agreement, your opioid analgesics (pain medicine) may be permanently discontinued. (Last update: 12/01/2017) ____________________________________________________________________________________________

## 2018-03-24 ENCOUNTER — Telehealth: Payer: Self-pay | Admitting: *Deleted

## 2018-04-05 ENCOUNTER — Other Ambulatory Visit: Payer: Self-pay | Admitting: Nurse Practitioner

## 2018-04-05 ENCOUNTER — Telehealth: Payer: Self-pay

## 2018-04-05 MED ORDER — DIAZEPAM 5 MG PO TABS: 5.0000 mg | ORAL_TABLET | ORAL | 0 refills | Status: DC | PRN

## 2018-04-05 NOTE — Telephone Encounter (Signed)
Valium/diazepam has been ordered He needs a follow up apt with Dr Lowella Dandy to discuss the MRI results and reevaluation of his current treatment plan Thanks

## 2018-04-05 NOTE — Telephone Encounter (Signed)
Pt called and is scheduled for an MRI Monday , He is asking to have something called in to help him relax during MRI, Please call and let him know if you call something in

## 2018-04-06 NOTE — Telephone Encounter (Signed)
Attempted to reach patient.  No voicemail capability 

## 2018-04-10 ENCOUNTER — Ambulatory Visit
Admission: RE | Admit: 2018-04-10 | Discharge: 2018-04-10 | Disposition: A | Discharge status: Home / Self Care | Payer: Medicare Other | Source: Ambulatory Visit | Attending: Nurse Practitioner | Admitting: Nurse Practitioner

## 2018-04-10 DIAGNOSIS — G8929 Other chronic pain: Secondary | ICD-10-CM | POA: Diagnosis not present

## 2018-04-10 DIAGNOSIS — M549 Dorsalgia, unspecified: Secondary | ICD-10-CM | POA: Diagnosis present

## 2018-04-10 DIAGNOSIS — M2578 Osteophyte, vertebrae: Secondary | ICD-10-CM | POA: Insufficient documentation

## 2018-04-10 DIAGNOSIS — M50321 Other cervical disc degeneration at C4-C5 level: Secondary | ICD-10-CM | POA: Diagnosis not present

## 2018-04-10 DIAGNOSIS — M5144 Schmorl's nodes, thoracic region: Secondary | ICD-10-CM | POA: Insufficient documentation

## 2018-04-10 DIAGNOSIS — M47812 Spondylosis without myelopathy or radiculopathy, cervical region: Secondary | ICD-10-CM

## 2018-04-10 DIAGNOSIS — M47814 Spondylosis without myelopathy or radiculopathy, thoracic region: Secondary | ICD-10-CM | POA: Diagnosis not present

## 2018-04-10 NOTE — Progress Notes (Signed)
Results were reviewed and found to be: mildly abnormal  No acute injury or pathology identified  Review would suggest interventional pain management techniques may be of benefit 

## 2018-04-11 NOTE — Telephone Encounter (Signed)
Spoke with patient and MRI is complete and he did get his medication.  Will see him on 04/26/18 for f/up

## 2018-04-24 NOTE — Progress Notes (Signed)
Patient's Name: Ricky Mercer  MRN: [id number]  Referring Provider: Sofie Hartigan, MD  DOB: 1951-02-12  PCP: Sofie Hartigan, MD  DOS: 04/26/2018  Note by: Gaspar Cola, MD  Service setting: Ambulatory outpatient  Specialty: Interventional Pain Management  Location: [facility] (AMB) Pain Management Facility    Patient type: Established   Primary Reason(s) for Visit: Evaluation of chronic illnesses with exacerbation, or progression (Level of risk: moderate) CC: Back Pain (mid, thoracic ); Foot Pain (bilateral ); and Neck Pain (bilateral )  HPI  Ricky Mercer is a 67 y.o. year old, male patient, who comes today for a follow-up evaluation. He has Hand weakness; Polyradiculopathy; Long term current use of opiate analgesic; Long term prescription opiate use; Opiate use (45 MME/Day); Opiate dependence ([place]); Encounter for therapeutic drug level monitoring; Chronic low back pain  (Primary Pain) (Bilateral) (L>R); Chronic lower extremity pain (Tertiary source of pain) (Bilateral) (L>R); Chronic lumbar radicular pain (Bilateral) (L>R) (Bilateral S1 dermatomal distribution); Chronic hip pain (Secondary source of pain) (Bilateral) (L>R); Chronic upper back pain (Bilateral) (L>R); Chronic neck pain (neck<shoulder) (L>R); Chronic foot pain; Chronic knee pain (Right); Cervical spondylosis; 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); Chronic upper extremity pain (Bilateral) (L>R); Lumbar spondylosis; Cervical facet hypertrophy; Cervical facet syndrome (R>L); History of prostate cancer; Radicular pain of shoulder; Class I Morbid obesity (HCC) (68% higher incidence of chronic low back problems); Peripheral neuropathy (HCC) (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); Lumbar facet syndrome (Bilateral) (L>R); Sleep apnea; Depression; High cholesterol; Low testosterone; History of surgical procedure; Opioid-induced constipation (OIC); Chronic shoulder pain (Bilateral) (L>R); Breathing orally;  Chest pain; Primary cardiomyopathy ([place]); Chronic sacroiliac joint pain (Bilateral) (L>R); Musculoskeletal pain; Pain of right foot; Neurogenic pain; Chest pain with high risk for cardiac etiology; Chronic pain syndrome; Bilateral arm pain; Vitamin D insufficiency; Pharmacologic therapy; Disorder of skeletal system; Problems influencing health status; DDD (degenerative disc disease), cervical; DDD (degenerative disc disease), lumbosacral; Cervicogenic headache; Cervico-occipital neuralgia; and Osteoarthritis of spine with radiculopathy, lumbar region on their problem list. Mr. Dougher was last seen on Visit date not found. His primarily concern today is the Back Pain (mid, thoracic ); Foot Pain (bilateral ); and Neck Pain (bilateral )  Pain Assessment: Location: Lower(bilateral) (foot pain) Radiating: radiating into feet causing it to be very difficult to get up from a resting position. lower back pain into hips. Onset: More than a month ago Duration: Chronic pain Quality: Constant, Burning, Tingling, Aching, Discomfort Severity: 6 /10 (subjective, self-reported pain score)  Note: Reported level is inconsistent with clinical observations. Clinically the patient looks like a 3/10 A 3/10 is viewed as "Moderate" and described as significantly interfering with activities of daily living (ADL). It becomes difficult to feed, bathe, get dressed, get on and off the toilet or to perform personal hygiene functions. Difficult to get in and out of bed or a chair without assistance. Very distracting. With effort, it can be ignored when deeply involved in activities. Information on the proper use of the pain scale provided to the patient today. When using our objective Pain Scale, levels between 6 and 10/10 are said to belong in an emergency room, as it progressively worsens from a 6/10, described as severely limiting, requiring emergency care not usually available at an outpatient pain management facility. At a 6/10  level, communication becomes difficult and requires great effort. Assistance to reach the emergency department may be required. Facial flushing and profuse sweating along with potentially dangerous increases in heart  rate and blood pressure will be evident. Effect on ADL: difficult to stand on feet after getting up.  back hurts now more than ever.  Timing: Constant Modifying factors: medication BP: (!) 123/96  HR: 83  Further details on both, my assessment(s), as well as the proposed treatment plan, please see below.  Laboratory Chemistry  Inflammation Markers (CRP: Acute Phase) (ESR: Chronic Phase) Lab Results  Component Value Date   CRP <1 05/04/2018   ESRSEDRATE 5 05/04/2018                         Rheumatology Markers No results found.  Renal Function Markers Lab Results  Component Value Date   BUN 11 05/04/2018   CREATININE 0.96 05/04/2018   BCR 11 05/04/2018   GFRAA 96 05/04/2018   GFRNONAA 83 05/04/2018                             Hepatic Function Markers Lab Results  Component Value Date   AST 19 05/04/2018   ALT 41 11/25/2015   ALBUMIN 4.3 05/04/2018   ALKPHOS 60 05/04/2018                        Electrolytes Lab Results  Component Value Date   NA 142 05/04/2018   K 4.3 05/04/2018   CL 103 05/04/2018   CALCIUM 9.4 05/04/2018   MG 1.9 05/04/2018                        Neuropathy Markers No results found.  Bone Pathology Markers No results found.  Coagulation Parameters Lab Results  Component Value Date   PLT 238 11/25/2015                        Cardiovascular Markers Lab Results  Component Value Date   HGB 14.6 11/25/2015   HCT 43.5 11/25/2015                         CA Markers No results found.  Note: Lab results reviewed.  Recent Diagnostic Imaging Review  Cervical Imaging: Cervical DG Bending/F/E views:  Results for orders placed during the hospital encounter of 04/10/18  DG Cervical Spine With Flex & Extend   Narrative  CLINICAL DATA:  Left-sided neck pain with no known trauma.  EXAM: CERVICAL SPINE COMPLETE WITH FLEXION AND EXTENSION VIEWS  COMPARISON:  None.  FINDINGS: The lateral views in neutral, flexed, and extension positions only visualizes cervical spine of the bottom of C6. No malalignment or instability identified through these levels. The neural foramina are patent. The lateral masses of C1 align with C2. The odontoid process is unremarkable. C7 and T1 are normal on the swimmer's view.  IMPRESSION: Mild multilevel degenerative disc disease with small anterior osteophytes most marked at C4-5 and C5-6. No other abnormalities.   Electronically Signed   By: Dorise Bullion III M.D   On: 04/10/2018 09:40    Thoracic Imaging: Thoracic MR wo contrast:  Results for orders placed during the hospital encounter of 04/10/18  MR THORACIC SPINE WO CONTRAST   Narrative CLINICAL DATA:  Mid back pain  EXAM: MRI THORACIC SPINE WITHOUT CONTRAST  TECHNIQUE: Multiplanar, multisequence MR imaging of the thoracic spine was performed. No intravenous contrast was administered.  COMPARISON:  None.  FINDINGS: Alignment:  Normal  Vertebrae: Negative for fracture or mass. Multiple Schmorl's nodes are present related disc degeneration. Hemangioma T11 vertebral body on the left.  Cord:  Normal signal and morphology.  Negative for cord compression  Paraspinal and other soft tissues: Negative for paraspinous mass or fluid collection  Disc levels:  Multilevel degenerative changes are present in the thoracic spine. No disc protrusion or significant spinal stenosis. Multiple Schmorl's nodes are present in the midthoracic spine and at T11-T12. No significant spurring.  IMPRESSION: Multilevel mild degenerative changes in the thoracic spine. Negative for disc protrusion or stenosis.   Electronically Signed   By: Franchot Gallo M.D.   On: 04/10/2018 09:29    Thoracic DG 2-3 views:  Results  for orders placed during the hospital encounter of 11/03/15  DG Thoracic Spine 2 View   Narrative CLINICAL DATA:  Upper back pain for 3-4 years, worsening the last 3 months. Does heavy lifting at work.  EXAM: THORACIC SPINE 2 VIEWS  COMPARISON:  CT chest 11/15/2013  FINDINGS: Mild degenerate spurring in the mid and lower thoracic spine. No fracture. Normal alignment. Visualized lungs are clear.  IMPRESSION: Mild spondylosis.  No acute findings.   Electronically Signed   By: Rolm Baptise M.D.   On: 11/03/2015 11:29    Complexity Note: Imaging results reviewed. Results shared with Mr. Howton, using Layman's terms.                         Meds   Current Outpatient Medications:  .  aspirin 81 MG tablet, Take 81 mg by mouth daily., Disp: , Rfl:  .  [START ON 06/20/2018] oxyCODONE (OXY IR/ROXICODONE) 5 MG immediate release tablet, Take 1 tablet (5 mg total) by mouth every 8 (eight) hours as needed for severe pain., Disp: 90 tablet, Rfl: 0 .  [START ON 05/21/2018] oxyCODONE (OXY IR/ROXICODONE) 5 MG immediate release tablet, Take 1 tablet (5 mg total) by mouth every 8 (eight) hours as needed for severe pain., Disp: 90 tablet, Rfl: 0 .  oxyCODONE (OXY IR/ROXICODONE) 5 MG immediate release tablet, Take 1 tablet (5 mg total) by mouth every 8 (eight) hours as needed for severe pain., Disp: 90 tablet, Rfl: 0 .  pantoprazole (PROTONIX) 40 MG tablet, Take 40 mg by mouth daily., Disp: , Rfl:  .  simvastatin (ZOCOR) 10 MG tablet, Take 10 mg by mouth at bedtime. , Disp: , Rfl:  .  Cholecalciferol 5000 units capsule, Take 1 capsule (5,000 Units total) by mouth daily., Disp: 90 capsule, Rfl: 0  ROS  Constitutional: Denies any fever or chills Gastrointestinal: No reported hemesis, hematochezia, vomiting, or acute GI distress Musculoskeletal: Denies any acute onset joint swelling, redness, loss of ROM, or weakness Neurological: No reported episodes of acute onset apraxia, aphasia, dysarthria,  agnosia, amnesia, paralysis, loss of coordination, or loss of consciousness  Allergies  Mr. Cafaro has No Known Allergies.  PFSH  Drug: Mr. Paulsen  reports that he does not use drugs. Alcohol:  reports that he does not drink alcohol. Tobacco:  reports that he has never smoked. He has never used smokeless tobacco. Medical:  has a past medical history of Acute postoperative pain (03/21/2017), Bilateral arm pain, Cardiomyopathy ([place]), Diverticulosis, Dupuytren's contracture, Erosive gastritis, Foot pain, bilateral, GERD (gastroesophageal reflux disease), Hand weakness, Hyperlipidemia, Knee pain, Midline low back pain, Osteoarthritis, Pain in neck, Prostate cancer ([place]), Reactive airway disease, Seasonal allergies, Situational anxiety, and Sleep apnea. Surgical: Mr. Schreier  has a past  surgical history that includes Nose surgery; Hand surgery (Bilateral); Colonoscopy (10/24/2012); Upper gi endoscopy (12/18/2012); Tonsillectomy (2009); Skin cancer excision (Left, 2015); Mass excision (Right, 07/18/2015); Esophagogastroduodenoscopy (egd) with propofol (N/A, 10/28/2015); prostate seeding; Hernia repair; Cholecystectomy (N/A, 12/02/2015); Esophagogastroduodenoscopy (N/A, 11/17/2017); and Colonoscopy with propofol (N/A, 11/17/2017). Family: family history includes Colon cancer in his brother; Hyperlipidemia in his sister; Lung cancer in his father; Prostate cancer in his brother.  Constitutional Exam  General appearance: Well nourished, well developed, and well hydrated. In no apparent acute distress Vitals:   04/26/18 1421  BP: (!) 123/96  Pulse: 83  Resp: 16  Temp: 97.9 F (36.6 C)  TempSrc: Oral  SpO2: 100%  Weight: 185 lb (83.9 kg)  Height: 5' 9"  (1.753 m)   BMI Assessment: Estimated body mass index is 27.32 kg/m as calculated from the following:   Height as of this encounter: 5' 9"  (1.753 m).   Weight as of this encounter: 185 lb (83.9 kg).  BMI interpretation table: BMI level Category Range  association with higher incidence of chronic pain  <18 kg/m2 Underweight   18.5-24.9 kg/m2 Ideal body weight   25-29.9 kg/m2 Overweight Increased incidence by 20%  30-34.9 kg/m2 Obese (Class I) Increased incidence by 68%  35-39.9 kg/m2 Severe obesity (Class II) Increased incidence by 136%  >40 kg/m2 Extreme obesity (Class III) Increased incidence by 254%   Patient's current BMI Ideal Body weight  Body mass index is 27.32 kg/m. Ideal body weight: 70.7 kg (155 lb 13.8 oz) Adjusted ideal body weight: 76 kg (167 lb 8.3 oz)   BMI Readings from Last 4 Encounters:  04/26/18 27.32 kg/m  03/22/18 27.32 kg/m  12/08/17 27.32 kg/m  11/17/17 28.13 kg/m   Wt Readings from Last 4 Encounters:  04/26/18 185 lb (83.9 kg)  03/22/18 185 lb (83.9 kg)  12/08/17 185 lb (83.9 kg)  11/17/17 185 lb (83.9 kg)  Psych/Mental status: Alert, oriented x 3 (person, place, & time)       Eyes: PERLA Respiratory: No evidence of acute respiratory distress  Cervical Spine Area Exam  Skin & Axial Inspection: No masses, redness, edema, swelling, or associated skin lesions Alignment: Symmetrical Functional ROM: Guarding      Stability: No instability detected Muscle Tone/Strength: Functionally intact. No obvious neuro-muscular anomalies detected. Sensory (Neurological): Movement-associated pain Palpation: Complains of area being tender to palpation              Upper Extremity (UE) Exam    Side: Right upper extremity  Side: Left upper extremity  Skin & Extremity Inspection: Skin color, temperature, and hair growth are WNL. No peripheral edema or cyanosis. No masses, redness, swelling, asymmetry, or associated skin lesions. No contractures.  Skin & Extremity Inspection: Skin color, temperature, and hair growth are WNL. No peripheral edema or cyanosis. No masses, redness, swelling, asymmetry, or associated skin lesions. No contractures.  Functional ROM: Unrestricted ROM          Functional ROM: Unrestricted ROM           Muscle Tone/Strength: Functionally intact. No obvious neuro-muscular anomalies detected.  Muscle Tone/Strength: Functionally intact. No obvious neuro-muscular anomalies detected.  Sensory (Neurological): Unimpaired          Sensory (Neurological): Unimpaired          Palpation: No palpable anomalies              Palpation: No palpable anomalies              Provocative Test(s):  Phalen's test: deferred Tinel's test: deferred Apley's scratch test (touch opposite shoulder):  Action 1 (Across chest): deferred Action 2 (Overhead): deferred Action 3 (LB reach): deferred   Provocative Test(s):  Phalen's test: deferred Tinel's test: deferred Apley's scratch test (touch opposite shoulder):  Action 1 (Across chest): deferred Action 2 (Overhead): deferred Action 3 (LB reach): deferred    Thoracic Spine Area Exam  Skin & Axial Inspection: No masses, redness, or swelling Alignment: Symmetrical Functional ROM: Unrestricted ROM Stability: No instability detected Muscle Tone/Strength: Functionally intact. No obvious neuro-muscular anomalies detected. Sensory (Neurological): Unimpaired Muscle strength & Tone: No palpable anomalies  Lumbar Spine Area Exam  Skin & Axial Inspection: No masses, redness, or swelling Alignment: Symmetrical Functional ROM: Decreased ROM       Stability: No instability detected Muscle Tone/Strength: Functionally intact. No obvious neuro-muscular anomalies detected. Sensory (Neurological): Movement-associated pain Palpation: No palpable anomalies       Provocative Tests: Hyperextension/rotation test: deferred today       Lumbar quadrant test (Kemp's test): deferred today       Lateral bending test: deferred today       Patrick's Maneuver: deferred today                   FABER test: deferred today                   S-I anterior distraction/compression test: deferred today         S-I lateral compression test: deferred today         S-I Thigh-thrust test:  deferred today         S-I Gaenslen's test: deferred today          Gait & Posture Assessment  Ambulation: Unassisted Gait: Relatively normal for age and body habitus Posture: WNL   Lower Extremity Exam    Side: Right lower extremity  Side: Left lower extremity  Stability: No instability observed          Stability: No instability observed          Skin & Extremity Inspection: Skin color, temperature, and hair growth are WNL. No peripheral edema or cyanosis. No masses, redness, swelling, asymmetry, or associated skin lesions. No contractures.  Skin & Extremity Inspection: Skin color, temperature, and hair growth are WNL. No peripheral edema or cyanosis. No masses, redness, swelling, asymmetry, or associated skin lesions. No contractures.  Functional ROM: Unrestricted ROM                  Functional ROM: Unrestricted ROM                  Muscle Tone/Strength: Functionally intact. No obvious neuro-muscular anomalies detected.  Muscle Tone/Strength: Functionally intact. No obvious neuro-muscular anomalies detected.  Sensory (Neurological): Unimpaired  Sensory (Neurological): Unimpaired  Palpation: No palpable anomalies  Palpation: No palpable anomalies   Assessment  Primary Diagnosis & Pertinent Problem List: The primary encounter diagnosis was Chronic low back pain  (Primary Pain) (Bilateral) (L>R). Diagnoses of Chronic hip pain (Secondary source of pain) (Bilateral) (L>R), Chronic lower extremity pain (Tertiary source of pain) (Bilateral) (L>R), Chronic pain syndrome, Pharmacologic therapy, Disorder of skeletal system, Problems influencing health status, Cervicalgia, Other intervertebral disc degeneration, lumbar region, 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), Osteoarthritis of spine with radiculopathy, cervical region, Chronic neck pain (neck<shoulder) (L>R), DDD (degenerative disc disease), cervical, DDD (degenerative disc disease), lumbosacral, Cervicogenic headache,  Cervico-occipital neuralgia, Lumbar spondylosis, Polyradiculopathy, Osteoarthritis of spine with radiculopathy,  lumbar region, and Chronic lumbar radicular pain (Bilateral) (L>R) (Bilateral S1 dermatomal distribution) were also pertinent to this visit.  Status Diagnosis  Persistent Unimproved Persistent 1. Chronic low back pain  (Primary Pain) (Bilateral) (L>R)   2. Chronic hip pain (Secondary source of pain) (Bilateral) (L>R)   3. Chronic lower extremity pain (Tertiary source of pain) (Bilateral) (L>R)   4. Chronic pain syndrome   5. Pharmacologic therapy   6. Disorder of skeletal system   7. Problems influencing health status   8. Cervicalgia   9. Other intervertebral disc degeneration, lumbar region   10. 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)   11. Osteoarthritis of spine with radiculopathy, cervical region   12. Chronic neck pain (neck<shoulder) (L>R)   13. DDD (degenerative disc disease), cervical   14. DDD (degenerative disc disease), lumbosacral   15. Cervicogenic headache   16. Cervico-occipital neuralgia   17. Lumbar spondylosis   18. Polyradiculopathy   19. Osteoarthritis of spine with radiculopathy, lumbar region   20. Chronic lumbar radicular pain (Bilateral) (L>R) (Bilateral S1 dermatomal distribution)     Problems updated and reviewed during this visit: No problems updated. Plan of Care  Pharmacotherapy (Medications Ordered): Meds ordered this encounter  Medications  . predniSONE (DELTASONE) 20 MG tablet    Sig: Take 3 tab(s) in the morning x 3 days, then 2 tab(s) x 3 days, followed by 1 tab x 3 days.    Dispense:  21 tablet    Refill:  0    Do not add to the "Automatic Refill" notification system.   Medications administered today: Delane Ginger had no medications administered during this visit.   Procedure Orders     Lumbar Epidural Injection  Lab Orders     ToxASSURE Select 13 (MW), Urine     Comp. Metabolic Panel (12)     Magnesium      Vitamin B12     Sedimentation rate     25-Hydroxyvitamin D Lcms D2+D3     C-reactive protein  Imaging Orders     MR CERVICAL SPINE WO CONTRAST     MR LUMBAR SPINE WO CONTRAST Referral Orders  No referral(s) requested today   Interventional management options: Planned, scheduled, and/or pending:   Schedule Right SI + Lumbar Facet RFA.   Considering:   Palliative Left cervical epidural steroid injection  Possible bilateral sacroiliac joint + lumbar facet RFA Diagnostic Bilateral HipBlock Diagnostic bilateral femoral nerve and obturator nerve block Possible bilateral femoral nerve and obturator nerve RFA Diagnostic bilateral cervical facet block. Possible bilateral cervical facet RFA. Diagnostic Caudal ESI + diagnostic epidurogram Diagnostic Right Knee injection. Possible series of 5 right-sided intra-articular [place] injections Diagnostic right Genicular NB Possible right Genicular Nerve RFA. Diagnostic bilateral intra-articular shoulderinjection Diagnostic bilateral suprascapular NB. Possible bilateral suprascapular Nerve RFA.   Palliative PRN treatment(s):   Diagnostic Bilateral Lumbar Facet & Bilateral S-I joint block Diagnostic Bilateral Hip Block Diagnostic bilateral cervical facetblock. Diagnostic Caudal ESI. Diagnostic Left CESI. Diagnostic Right Knee injection. Diagnostic right Genicular NB Diagnostic bilateral intra-articular shoulderinj. Diagnostic bilateral suprascapular NB.   Provider-requested follow-up: Return for F/U eval (after test completion), PRN Procedure: ML L4-5 LESI #1 (w/ sedation).  Future Appointments  Date Time Provider [place]  06/22/2018 11:30 AM Vevelyn Francois, NP [facility] None   Primary Care Physician: Sofie Hartigan, MD Location: [facility] Note by: Gaspar Cola, MD Date: 04/26/2018; Time: 3:33 PM

## 2018-04-25 ENCOUNTER — Encounter: Payer: Self-pay | Admitting: Pain Medicine

## 2018-04-25 DIAGNOSIS — M899 Disorder of bone, unspecified: Secondary | ICD-10-CM | POA: Insufficient documentation

## 2018-04-25 DIAGNOSIS — Z79899 Other long term (current) drug therapy: Secondary | ICD-10-CM | POA: Insufficient documentation

## 2018-04-25 DIAGNOSIS — Z789 Other specified health status: Secondary | ICD-10-CM | POA: Insufficient documentation

## 2018-04-26 ENCOUNTER — Ambulatory Visit: Payer: Medicare Other | Attending: Pain Medicine | Admitting: Pain Medicine

## 2018-04-26 ENCOUNTER — Encounter: Payer: Self-pay | Admitting: Pain Medicine

## 2018-04-26 VITALS — BP 123/96 | HR 83 | Temp 97.9°F | Resp 16 | Ht 69.0 in | Wt 185.0 lb

## 2018-04-26 DIAGNOSIS — R079 Chest pain, unspecified: Secondary | ICD-10-CM | POA: Diagnosis not present

## 2018-04-26 DIAGNOSIS — K5900 Constipation, unspecified: Secondary | ICD-10-CM | POA: Diagnosis not present

## 2018-04-26 DIAGNOSIS — M4802 Spinal stenosis, cervical region: Secondary | ICD-10-CM | POA: Insufficient documentation

## 2018-04-26 DIAGNOSIS — Z8546 Personal history of malignant neoplasm of prostate: Secondary | ICD-10-CM | POA: Insufficient documentation

## 2018-04-26 DIAGNOSIS — M79602 Pain in left arm: Secondary | ICD-10-CM | POA: Insufficient documentation

## 2018-04-26 DIAGNOSIS — M25561 Pain in right knee: Secondary | ICD-10-CM | POA: Diagnosis not present

## 2018-04-26 DIAGNOSIS — M25512 Pain in left shoulder: Secondary | ICD-10-CM | POA: Insufficient documentation

## 2018-04-26 DIAGNOSIS — M5416 Radiculopathy, lumbar region: Secondary | ICD-10-CM

## 2018-04-26 DIAGNOSIS — M79671 Pain in right foot: Secondary | ICD-10-CM | POA: Insufficient documentation

## 2018-04-26 DIAGNOSIS — J45909 Unspecified asthma, uncomplicated: Secondary | ICD-10-CM | POA: Insufficient documentation

## 2018-04-26 DIAGNOSIS — M545 Low back pain: Secondary | ICD-10-CM | POA: Insufficient documentation

## 2018-04-26 DIAGNOSIS — M25511 Pain in right shoulder: Secondary | ICD-10-CM | POA: Insufficient documentation

## 2018-04-26 DIAGNOSIS — G4730 Sleep apnea, unspecified: Secondary | ICD-10-CM | POA: Insufficient documentation

## 2018-04-26 DIAGNOSIS — G4486 Cervicogenic headache: Secondary | ICD-10-CM | POA: Insufficient documentation

## 2018-04-26 DIAGNOSIS — M5442 Lumbago with sciatica, left side: Secondary | ICD-10-CM | POA: Diagnosis not present

## 2018-04-26 DIAGNOSIS — M546 Pain in thoracic spine: Secondary | ICD-10-CM | POA: Diagnosis present

## 2018-04-26 DIAGNOSIS — M79605 Pain in left leg: Secondary | ICD-10-CM | POA: Insufficient documentation

## 2018-04-26 DIAGNOSIS — Z789 Other specified health status: Secondary | ICD-10-CM

## 2018-04-26 DIAGNOSIS — Z85828 Personal history of other malignant neoplasm of skin: Secondary | ICD-10-CM | POA: Insufficient documentation

## 2018-04-26 DIAGNOSIS — M5010 Cervical disc disorder with radiculopathy, unspecified cervical region: Secondary | ICD-10-CM | POA: Insufficient documentation

## 2018-04-26 DIAGNOSIS — M47816 Spondylosis without myelopathy or radiculopathy, lumbar region: Secondary | ICD-10-CM

## 2018-04-26 DIAGNOSIS — M5481 Occipital neuralgia: Secondary | ICD-10-CM | POA: Insufficient documentation

## 2018-04-26 DIAGNOSIS — M5116 Intervertebral disc disorders with radiculopathy, lumbar region: Secondary | ICD-10-CM | POA: Insufficient documentation

## 2018-04-26 DIAGNOSIS — M5030 Other cervical disc degeneration, unspecified cervical region: Secondary | ICD-10-CM | POA: Insufficient documentation

## 2018-04-26 DIAGNOSIS — Z79899 Other long term (current) drug therapy: Secondary | ICD-10-CM | POA: Insufficient documentation

## 2018-04-26 DIAGNOSIS — R51 Headache: Secondary | ICD-10-CM | POA: Insufficient documentation

## 2018-04-26 DIAGNOSIS — M79672 Pain in left foot: Secondary | ICD-10-CM | POA: Diagnosis present

## 2018-04-26 DIAGNOSIS — F329 Major depressive disorder, single episode, unspecified: Secondary | ICD-10-CM | POA: Diagnosis not present

## 2018-04-26 DIAGNOSIS — M899 Disorder of bone, unspecified: Secondary | ICD-10-CM

## 2018-04-26 DIAGNOSIS — Z7982 Long term (current) use of aspirin: Secondary | ICD-10-CM | POA: Insufficient documentation

## 2018-04-26 DIAGNOSIS — M79604 Pain in right leg: Secondary | ICD-10-CM | POA: Insufficient documentation

## 2018-04-26 DIAGNOSIS — M7918 Myalgia, other site: Secondary | ICD-10-CM | POA: Insufficient documentation

## 2018-04-26 DIAGNOSIS — M533 Sacrococcygeal disorders, not elsewhere classified: Secondary | ICD-10-CM | POA: Insufficient documentation

## 2018-04-26 DIAGNOSIS — M5410 Radiculopathy, site unspecified: Secondary | ICD-10-CM

## 2018-04-26 DIAGNOSIS — M542 Cervicalgia: Secondary | ICD-10-CM | POA: Diagnosis present

## 2018-04-26 DIAGNOSIS — G629 Polyneuropathy, unspecified: Secondary | ICD-10-CM | POA: Diagnosis not present

## 2018-04-26 DIAGNOSIS — M47814 Spondylosis without myelopathy or radiculopathy, thoracic region: Secondary | ICD-10-CM | POA: Insufficient documentation

## 2018-04-26 DIAGNOSIS — M720 Palmar fascial fibromatosis [Dupuytren]: Secondary | ICD-10-CM | POA: Insufficient documentation

## 2018-04-26 DIAGNOSIS — M4726 Other spondylosis with radiculopathy, lumbar region: Secondary | ICD-10-CM | POA: Insufficient documentation

## 2018-04-26 DIAGNOSIS — M51379 Other intervertebral disc degeneration, lumbosacral region without mention of lumbar back pain or lower extremity pain: Secondary | ICD-10-CM | POA: Insufficient documentation

## 2018-04-26 DIAGNOSIS — M9981 Other biomechanical lesions of cervical region: Secondary | ICD-10-CM

## 2018-04-26 DIAGNOSIS — M79601 Pain in right arm: Secondary | ICD-10-CM | POA: Insufficient documentation

## 2018-04-26 DIAGNOSIS — Z923 Personal history of irradiation: Secondary | ICD-10-CM | POA: Insufficient documentation

## 2018-04-26 DIAGNOSIS — K219 Gastro-esophageal reflux disease without esophagitis: Secondary | ICD-10-CM | POA: Insufficient documentation

## 2018-04-26 DIAGNOSIS — M4722 Other spondylosis with radiculopathy, cervical region: Secondary | ICD-10-CM | POA: Diagnosis not present

## 2018-04-26 DIAGNOSIS — M25559 Pain in unspecified hip: Secondary | ICD-10-CM

## 2018-04-26 DIAGNOSIS — G8929 Other chronic pain: Secondary | ICD-10-CM

## 2018-04-26 DIAGNOSIS — Z79891 Long term (current) use of opiate analgesic: Secondary | ICD-10-CM | POA: Insufficient documentation

## 2018-04-26 DIAGNOSIS — I429 Cardiomyopathy, unspecified: Secondary | ICD-10-CM | POA: Diagnosis not present

## 2018-04-26 DIAGNOSIS — G894 Chronic pain syndrome: Secondary | ICD-10-CM | POA: Diagnosis not present

## 2018-04-26 DIAGNOSIS — K2960 Other gastritis without bleeding: Secondary | ICD-10-CM | POA: Insufficient documentation

## 2018-04-26 DIAGNOSIS — M5137 Other intervertebral disc degeneration, lumbosacral region: Secondary | ICD-10-CM | POA: Insufficient documentation

## 2018-04-26 DIAGNOSIS — M5136 Other intervertebral disc degeneration, lumbar region: Secondary | ICD-10-CM

## 2018-04-26 DIAGNOSIS — E7800 Pure hypercholesterolemia, unspecified: Secondary | ICD-10-CM | POA: Diagnosis not present

## 2018-04-26 DIAGNOSIS — M5441 Lumbago with sciatica, right side: Secondary | ICD-10-CM

## 2018-04-26 MED ORDER — PREDNISONE 20 MG PO TABS: ORAL_TABLET | ORAL | 0 refills | Status: AC

## 2018-04-26 NOTE — Patient Instructions (Addendum)
Epidural Steroid Injection Patient Information  Description: The epidural space surrounds the nerves as they exit the spinal cord.  In some patients, the nerves can be compressed and inflamed by a bulging disc or a tight spinal canal (spinal stenosis).  By injecting steroids into the epidural space, we can bring irritated nerves into direct contact with a potentially helpful medication.  These steroids act directly on the irritated nerves and can reduce swelling and inflammation which often leads to decreased pain.  Epidural steroids may be injected anywhere along the spine and from the neck to the low back depending upon the location of your pain.   After numbing the skin with local anesthetic (like Novocaine), a small needle is passed into the epidural space slowly.  You may experience a sensation of pressure while this is being done.  The entire block usually last less than 10 minutes.  Conditions which may be treated by epidural steroids:   Low back and leg pain  Neck and arm pain  Spinal stenosis  Post-laminectomy syndrome  Herpes zoster (shingles) pain  Pain from compression fractures  Preparation for the injection:  1. Do not eat any solid food or dairy products within 8 hours of your appointment.  2. You may drink clear liquids up to 3 hours before appointment.  Clear liquids include water, black coffee, juice or soda.  No milk or cream please. 3. You may take your regular medication, including pain medications, with a sip of water before your appointment  Diabetics should hold regular insulin (if taken separately) and take 1/2 normal NPH dos the morning of the procedure.  Carry some sugar containing items with you to your appointment. 4. A driver must accompany you and be prepared to drive you home after your procedure.  5. Bring all your current medications with your. 6. An IV may be inserted and sedation may be given at the discretion of the physician.   7. A blood pressure  cuff, EKG and other monitors will often be applied during the procedure.  Some patients may need to have extra oxygen administered for a short period. 8. You will be asked to provide medical information, including your allergies, prior to the procedure.  We must know immediately if you are taking blood thinners (like Coumadin/Warfarin)  Or if you are allergic to IV iodine contrast (dye). We must know if you could possible be pregnant.  Possible side-effects:  Bleeding from needle site  Infection (rare, may require surgery)  Nerve injury (rare)  Numbness & tingling (temporary)  Difficulty urinating (rare, temporary)  Spinal headache ( a headache worse with upright posture)  Light -headedness (temporary)  Pain at injection site (several days)  Decreased blood pressure (temporary)  Weakness in arm/leg (temporary)  Pressure sensation in back/neck (temporary)  Call if you experience:  Fever/chills associated with headache or increased back/neck pain.  Headache worsened by an upright position.  New onset weakness or numbness of an extremity below the injection site  Hives or difficulty breathing (go to the emergency room)  Inflammation or drainage at the infection site  Severe back/neck pain  Any new symptoms which are concerning to you  Please note:  Although the local anesthetic injected can often make your back or neck feel good for several hours after the injection, the pain will likely return.  It takes 3-7 days for steroids to work in the epidural space.  You may not notice any pain relief for at least that one week.    If effective, we will often do a series of three injections spaced 3-6 weeks apart to maximally decrease your pain.  After the initial series, we generally will wait several months before considering a repeat injection of the same type.  If you have any questions, please call [phone] [facility]   ____________________________________________________________________________________________  CANNABIDIOL (AKA: CBD Oil or Pills)  Applies to: All patients receiving prescriptions of controlled substances (written and/or electronic).  General Information: Cannabidiol (CBD) was discovered in 70. It is one of some 113 identified cannabinoids in cannabis (Marijuana) plants, accounting for up to 40% of the plant's extract. As of 2018, preliminary clinical research on cannabidiol included studies of anxiety, cognition, movement disorders, and pain.  Cannabidiol is consummed in multiple ways, including inhalation of cannabis smoke or vapor, as an aerosol spray into the cheek, and by mouth. It may be supplied as CBD oil containing CBD as the active ingredient (no added tetrahydrocannabinol (THC) or terpenes), a full-plant CBD-dominant hemp extract oil, capsules, dried cannabis, or as a liquid solution. CBD is thought not have the same psychoactivity as THC, and may affect the actions of THC. Studies suggest that CBD may interact with different biological targets, including cannabinoid receptors and other neurotransmitter receptors. As of 2018 the mechanism of action for its biological effects has not been determined.  In the [place], cannabidiol has a limited approval by the Food and Drug Administration (FDA) for treatment of only two types of epilepsy disorders. The side effects of long-term use of the drug include somnolence, decreased appetite, diarrhea, fatigue, malaise, weakness, sleeping problems, and others.  CBD remains a Schedule I drug prohibited for any use.  Legality: Some manufacturers ship CBD products nationally, an illegal action which the FDA has not enforced in 2018, with CBD remaining the subject of an FDA investigational new drug evaluation, and is not considered legal as a dietary supplement or food ingredient as of December 2018. Federal illegality has made it  difficult historically to conduct research on CBD. CBD is openly sold in head shops and health food stores in some states where such sales have not been explicitly legalized.  Warning: Because it is not FDA approved for general use or treatment of pain, it is not required to undergo the same manufacturing controls as prescription drugs.  This means that the available cannabidiol (CBD) may be contaminated with THC.  If this is the case, it will trigger a positive urine drug screen (UDS) test for cannabinoids (Marijuana).  Because a positive UDS for illicit substances is a violation of our medication agreement, your opioid analgesics (pain medicine) may be permanently discontinued. (Last update: 12/01/2017) ____________________________________________________________________________________________   ____________________________________________________________________________________________  Preparing for Procedure with Sedation  Instructions: . Oral Intake: Do not eat or drink anything for at least 8 hours prior to your procedure. . Transportation: Public transportation is not allowed. Bring an adult driver. The driver must be physically present in our waiting room before any procedure can be started. Marland Kitchen Physical Assistance: Bring an adult physically capable of assisting you, in the event you need help. This adult should keep you company at home for at least 6 hours after the procedure. . Blood Pressure Medicine: Take your blood pressure medicine with a sip of water the morning of the procedure. . Blood thinners: Notify our staff if you are taking any blood thinners. Depending on which one you take, there will be specific instructions on how and when to stop it. Marland Kitchen  Diabetics on insulin: Notify the staff so that you can be scheduled 1st case in the morning. If your diabetes requires high dose insulin, take only  of your normal insulin dose the morning of the procedure and notify the staff that you  have done so. . Preventing infections: Shower with an antibacterial soap the morning of your procedure. . Build-up your immune system: Take 1000 mg of Vitamin C with every meal (3 times a day) the day prior to your procedure. Marland Kitchen Antibiotics: Inform the staff if you have a condition or reason that requires you to take antibiotics before dental procedures. . Pregnancy: If you are pregnant, call and cancel the procedure. . Sickness: If you have a cold, fever, or any active infections, call and cancel the procedure. . Arrival: You must be in the facility at least 30 minutes prior to your scheduled procedure. . Children: Do not bring children with you. . Dress appropriately: Bring dark clothing that you would not mind if they get stained. . Valuables: Do not bring any jewelry or valuables.  Procedure appointments are reserved for interventional treatments only. Marland Kitchen No Prescription Refills. . No medication changes will be discussed during procedure appointments. . No disability issues will be discussed.  Reasons to call and reschedule or cancel your procedure: (Following these recommendations will minimize the risk of a serious complication.) . Surgeries: Avoid having procedures within 2 weeks of any surgery. (Avoid for 2 weeks before or after any surgery). . Flu Shots: Avoid having procedures within 2 weeks of a flu shots or . (Avoid for 2 weeks before or after immunizations). . Barium: Avoid having a procedure within 7-10 days after having had a radiological study involving the use of radiological contrast. (Myelograms, Barium swallow or enema study). . Heart attacks: Avoid any elective procedures or surgeries for the initial 6 months after a "Myocardial Infarction" (Heart Attack). . Blood thinners: It is imperative that you stop these medications before procedures. Let us know if you if you take any blood thinner.  . Infection: Avoid procedures during or within two weeks of an infection (including  chest colds or gastrointestinal problems). Symptoms associated with infections include: Localized redness, fever, chills, night sweats or profuse sweating, burning sensation when voiding, cough, congestion, stuffiness, runny nose, sore throat, diarrhea, nausea, vomiting, cold or Flu symptoms, recent or current infections. It is specially important if the infection is over the area that we intend to treat. Marland Kitchen Heart and lung problems: Symptoms that may suggest an active cardiopulmonary problem include: cough, chest pain, breathing difficulties or shortness of breath, dizziness, ankle swelling, uncontrolled high or unusually low blood pressure, and/or palpitations. If you are experiencing any of these symptoms, cancel your procedure and contact your primary care physician for an evaluation.  Remember:  Regular Business hours are:  Monday to Thursday 8:00 AM to 4:00 PM  Provider's Schedule: Milinda Pointer, MD:  Procedure days: Tuesday and Thursday 7:30 AM to 4:00 PM  Gillis Santa, MD:  Procedure days: Monday and Wednesday 7:30 AM to 4:00 PM ____________________________________________________________________________________________

## 2018-04-26 NOTE — Progress Notes (Signed)
Nursing Pain Medication Assessment:  Safety precautions to be maintained throughout the outpatient stay will include: orient to surroundings, keep bed in low position, maintain call bell within reach at all times, provide assistance with transfer out of bed and ambulation.  Medication Inspection Compliance: Pill count conducted under aseptic conditions, in front of the patient. Neither the pills nor the bottle was removed from the patient's sight at any time. Once count was completed pills were immediately returned to the patient in their original bottle.  Medication: Oxycodone IR Pill/Patch Count: 14 of 90 pills remain Pill/Patch Appearance: Markings consistent with prescribed medication Bottle Appearance: Standard pharmacy container. Clearly labeled. Filled Date: 07 / 11 / 2019 Last Medication intake:  Today   Patient states that he picked up new Rx on 04/24/18 qty 17.

## 2018-05-09 LAB — COMP. METABOLIC PANEL (12)
AST: 19 IU/L (ref 0–40)
Albumin/Globulin Ratio: 1.7 (ref 1.2–2.2)
Albumin: 4.3 g/dL (ref 3.6–4.8)
Alkaline Phosphatase: 60 IU/L (ref 39–117)
BILIRUBIN TOTAL: 0.5 mg/dL (ref 0.0–1.2)
BUN / CREAT RATIO: 11 (ref 10–24)
BUN: 11 mg/dL (ref 8–27)
CALCIUM: 9.4 mg/dL (ref 8.6–10.2)
CREATININE: 0.96 mg/dL (ref 0.76–1.27)
Chloride: 103 mmol/L (ref 96–106)
GFR calc Af Amer: 96 mL/min/{1.73_m2} (ref >59)
GFR calc non Af Amer: 83 mL/min/{1.73_m2} (ref >59)
Globulin, Total: 2.5 g/dL (ref 1.5–4.5)
Glucose: 106 mg/dL — ABNORMAL HIGH (ref 65–99)
Potassium: 4.3 mmol/L (ref 3.5–5.2)
Sodium: 142 mmol/L (ref 134–144)
Total Protein: 6.8 g/dL (ref 6.0–8.5)

## 2018-05-09 LAB — 25-HYDROXYVITAMIN D LCMS D2+D3: 25-HYDROXY, VITAMIN D: 85 ng/mL

## 2018-05-09 LAB — C-REACTIVE PROTEIN: CRP: <1 mg/L (ref 0–10)

## 2018-05-09 LAB — VITAMIN B12: Vitamin B-12: 395 pg/mL (ref 232–1245)

## 2018-05-09 LAB — SEDIMENTATION RATE: SED RATE: 5 mm/h (ref 0–30)

## 2018-05-09 LAB — TOXASSURE SELECT 13 (MW), URINE

## 2018-05-09 LAB — MAGNESIUM: MAGNESIUM: 1.9 mg/dL (ref 1.6–2.3)

## 2018-05-09 LAB — 25-HYDROXY VITAMIN D LCMS D2+D3: 25-Hydroxy, Vitamin D-3: 85 ng/mL

## 2018-05-11 DIAGNOSIS — R002 Palpitations: Secondary | ICD-10-CM | POA: Insufficient documentation

## 2018-06-01 DIAGNOSIS — R200 Anesthesia of skin: Secondary | ICD-10-CM | POA: Insufficient documentation

## 2018-06-01 DIAGNOSIS — R202 Paresthesia of skin: Secondary | ICD-10-CM | POA: Insufficient documentation

## 2018-06-01 DIAGNOSIS — R208 Other disturbances of skin sensation: Secondary | ICD-10-CM | POA: Insufficient documentation

## 2018-06-01 DIAGNOSIS — R29898 Other symptoms and signs involving the musculoskeletal system: Secondary | ICD-10-CM | POA: Insufficient documentation

## 2018-06-22 ENCOUNTER — Encounter: Payer: Self-pay | Admitting: Nurse Practitioner

## 2018-06-22 ENCOUNTER — Ambulatory Visit: Payer: Medicare Other | Attending: Nurse Practitioner | Admitting: Nurse Practitioner

## 2018-06-22 ENCOUNTER — Other Ambulatory Visit: Payer: Self-pay

## 2018-06-22 VITALS — BP 129/92 | HR 81 | Temp 98.0°F | Resp 16 | Ht 69.0 in | Wt 187.0 lb

## 2018-06-22 DIAGNOSIS — M79602 Pain in left arm: Secondary | ICD-10-CM | POA: Insufficient documentation

## 2018-06-22 DIAGNOSIS — M5137 Other intervertebral disc degeneration, lumbosacral region: Secondary | ICD-10-CM | POA: Diagnosis not present

## 2018-06-22 DIAGNOSIS — M47892 Other spondylosis, cervical region: Secondary | ICD-10-CM | POA: Insufficient documentation

## 2018-06-22 DIAGNOSIS — M25512 Pain in left shoulder: Secondary | ICD-10-CM | POA: Insufficient documentation

## 2018-06-22 DIAGNOSIS — M47816 Spondylosis without myelopathy or radiculopathy, lumbar region: Secondary | ICD-10-CM

## 2018-06-22 DIAGNOSIS — M79605 Pain in left leg: Secondary | ICD-10-CM | POA: Insufficient documentation

## 2018-06-22 DIAGNOSIS — M25551 Pain in right hip: Secondary | ICD-10-CM | POA: Insufficient documentation

## 2018-06-22 DIAGNOSIS — M720 Palmar fascial fibromatosis [Dupuytren]: Secondary | ICD-10-CM | POA: Insufficient documentation

## 2018-06-22 DIAGNOSIS — M4802 Spinal stenosis, cervical region: Secondary | ICD-10-CM | POA: Diagnosis not present

## 2018-06-22 DIAGNOSIS — Z9049 Acquired absence of other specified parts of digestive tract: Secondary | ICD-10-CM | POA: Insufficient documentation

## 2018-06-22 DIAGNOSIS — Z6827 Body mass index (BMI) 27.0-27.9, adult: Secondary | ICD-10-CM | POA: Diagnosis not present

## 2018-06-22 DIAGNOSIS — M25561 Pain in right knee: Secondary | ICD-10-CM | POA: Diagnosis not present

## 2018-06-22 DIAGNOSIS — F329 Major depressive disorder, single episode, unspecified: Secondary | ICD-10-CM | POA: Insufficient documentation

## 2018-06-22 DIAGNOSIS — M5030 Other cervical disc degeneration, unspecified cervical region: Secondary | ICD-10-CM

## 2018-06-22 DIAGNOSIS — M546 Pain in thoracic spine: Secondary | ICD-10-CM | POA: Diagnosis not present

## 2018-06-22 DIAGNOSIS — R531 Weakness: Secondary | ICD-10-CM | POA: Insufficient documentation

## 2018-06-22 DIAGNOSIS — M533 Sacrococcygeal disorders, not elsewhere classified: Secondary | ICD-10-CM | POA: Diagnosis not present

## 2018-06-22 DIAGNOSIS — M4726 Other spondylosis with radiculopathy, lumbar region: Secondary | ICD-10-CM | POA: Insufficient documentation

## 2018-06-22 DIAGNOSIS — M79601 Pain in right arm: Secondary | ICD-10-CM | POA: Diagnosis not present

## 2018-06-22 DIAGNOSIS — M25552 Pain in left hip: Secondary | ICD-10-CM | POA: Diagnosis not present

## 2018-06-22 DIAGNOSIS — G894 Chronic pain syndrome: Secondary | ICD-10-CM | POA: Diagnosis not present

## 2018-06-22 DIAGNOSIS — Z79891 Long term (current) use of opiate analgesic: Secondary | ICD-10-CM | POA: Diagnosis not present

## 2018-06-22 DIAGNOSIS — M47896 Other spondylosis, lumbar region: Secondary | ICD-10-CM | POA: Diagnosis not present

## 2018-06-22 DIAGNOSIS — K219 Gastro-esophageal reflux disease without esophagitis: Secondary | ICD-10-CM | POA: Insufficient documentation

## 2018-06-22 DIAGNOSIS — M79671 Pain in right foot: Secondary | ICD-10-CM | POA: Insufficient documentation

## 2018-06-22 DIAGNOSIS — M79672 Pain in left foot: Secondary | ICD-10-CM | POA: Insufficient documentation

## 2018-06-22 DIAGNOSIS — I429 Cardiomyopathy, unspecified: Secondary | ICD-10-CM | POA: Insufficient documentation

## 2018-06-22 DIAGNOSIS — M545 Low back pain: Secondary | ICD-10-CM | POA: Diagnosis not present

## 2018-06-22 DIAGNOSIS — Z79899 Other long term (current) drug therapy: Secondary | ICD-10-CM | POA: Insufficient documentation

## 2018-06-22 DIAGNOSIS — E7800 Pure hypercholesterolemia, unspecified: Secondary | ICD-10-CM | POA: Insufficient documentation

## 2018-06-22 DIAGNOSIS — Z5181 Encounter for therapeutic drug level monitoring: Secondary | ICD-10-CM | POA: Diagnosis not present

## 2018-06-22 DIAGNOSIS — E785 Hyperlipidemia, unspecified: Secondary | ICD-10-CM | POA: Insufficient documentation

## 2018-06-22 DIAGNOSIS — Z8546 Personal history of malignant neoplasm of prostate: Secondary | ICD-10-CM | POA: Insufficient documentation

## 2018-06-22 DIAGNOSIS — M25511 Pain in right shoulder: Secondary | ICD-10-CM | POA: Insufficient documentation

## 2018-06-22 DIAGNOSIS — G629 Polyneuropathy, unspecified: Secondary | ICD-10-CM | POA: Diagnosis not present

## 2018-06-22 DIAGNOSIS — J45909 Unspecified asthma, uncomplicated: Secondary | ICD-10-CM | POA: Insufficient documentation

## 2018-06-22 DIAGNOSIS — G4730 Sleep apnea, unspecified: Secondary | ICD-10-CM | POA: Insufficient documentation

## 2018-06-22 DIAGNOSIS — Z7982 Long term (current) use of aspirin: Secondary | ICD-10-CM | POA: Insufficient documentation

## 2018-06-22 DIAGNOSIS — M79604 Pain in right leg: Secondary | ICD-10-CM | POA: Diagnosis not present

## 2018-06-22 DIAGNOSIS — R200 Anesthesia of skin: Secondary | ICD-10-CM | POA: Insufficient documentation

## 2018-06-22 DIAGNOSIS — K5900 Constipation, unspecified: Secondary | ICD-10-CM | POA: Insufficient documentation

## 2018-06-22 DIAGNOSIS — R002 Palpitations: Secondary | ICD-10-CM | POA: Insufficient documentation

## 2018-06-22 MED ORDER — OXYCODONE HCL 5 MG PO TABS: 5.0000 mg | ORAL_TABLET | Freq: Three times a day (TID) | ORAL | 0 refills | Status: DC | PRN

## 2018-06-22 NOTE — Progress Notes (Signed)
Nursing Pain Medication Assessment:  Safety precautions to be maintained throughout the outpatient stay will include: orient to surroundings, keep bed in low position, maintain call bell within reach at all times, provide assistance with transfer out of bed and ambulation.  Medication Inspection Compliance: Pill count conducted under aseptic conditions, in front of the patient. Neither the pills nor the bottle was removed from the patient's sight at any time. Once count was completed pills were immediately returned to the patient in their original bottle.  Medication: Oxycodone IR Pill/Patch Count: 18 of 90 pills remain Pill/Patch Appearance: Markings consistent with prescribed medication Bottle Appearance: Standard pharmacy container. Clearly labeled. Filled Date: 8 / 10 / 2019 Last Medication intake:  Today

## 2018-06-22 NOTE — Progress Notes (Signed)
Patient's Name: Ricky Mercer  MRN: [id number]  Referring Provider: Sofie Hartigan, MD  DOB: [birth year]  PCP: Sofie Hartigan, MD  DOS: 06/22/2018  Note by: Vevelyn Francois NP  Service setting: Ambulatory outpatient  Specialty: Interventional Pain Management  Location: [facility] (AMB) Pain Management Facility    Patient type: Established    Primary Reason(s) for Visit: Encounter for prescription drug management. (Level of risk: moderate)  CC: Back Pain (lower)  HPI  Ricky Mercer is a 67 y.o. year old, male patient, who comes today for a medication management evaluation. He has Hand weakness; Polyradiculopathy; Long term current use of opiate analgesic; Long term prescription opiate use; Opiate use (45 MME/Day); Opiate dependence ([place]); Encounter for therapeutic drug level monitoring; Chronic low back pain  (Primary Pain) (Bilateral) (L>R); Chronic lower extremity pain (Tertiary source of pain) (Bilateral) (L>R); Chronic lumbar radicular pain (Bilateral) (L>R) (Bilateral S1 dermatomal distribution); Chronic hip pain (Secondary source of pain) (Bilateral) (L>R); Chronic upper back pain (Bilateral) (L>R); Chronic neck pain (neck<shoulder) (L>R); Chronic foot pain; Chronic knee pain (Right); Cervical spondylosis; 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); Chronic upper extremity pain (Bilateral) (L>R); Lumbar spondylosis; Cervical facet hypertrophy; Cervical facet syndrome (R>L); History of prostate cancer; Radicular pain of shoulder; Class I Morbid obesity (HCC) (68% higher incidence of chronic low back problems); Peripheral neuropathy (HCC) (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); Lumbar facet syndrome (Bilateral) (L>R); Sleep apnea; Depression; High cholesterol; Low testosterone; History of surgical procedure; Opioid-induced constipation (OIC); Chronic shoulder pain (Bilateral) (L>R); Breathing orally; Chest pain; Primary cardiomyopathy ([place]); Chronic sacroiliac joint pain  (Bilateral) (L>R); Musculoskeletal pain; Pain of right foot; Neurogenic pain; Chest pain with high risk for cardiac etiology; Chronic pain syndrome; Bilateral arm pain; Vitamin D insufficiency; Pharmacologic therapy; Disorder of skeletal system; Problems influencing health status; DDD (degenerative disc disease), cervical; DDD (degenerative disc disease), lumbosacral; Cervicogenic headache; Cervico-occipital neuralgia; Osteoarthritis of spine with radiculopathy, lumbar region; Decreased sensation; Heart palpitations; Numbness and tingling; and Weakness of left leg on their problem list. His primarily concern today is the Back Pain (lower)  Pain Assessment: Location: Lower Back Radiating: radiates down backs of both legs to feet including toes; pt states it feels like he is standing on pins and needles all the time Onset: More than a month ago Duration: Chronic pain Quality: Constant, Burning, Tingling, Aching, Discomfort Severity: 6 /10 (subjective, self-reported pain score)  Note: Reported level is compatible with observation.                         When using our objective Pain Scale, levels between 6 and 10/10 are said to belong in an emergency room, as it progressively worsens from a 6/10, described as severely limiting, requiring emergency care not usually available at an outpatient pain management facility. At a 6/10 level, communication becomes difficult and requires great effort. Assistance to reach the emergency department may be required. Facial flushing and profuse sweating along with potentially dangerous increases in heart rate and blood pressure will be evident. Effect on ADL: difficult standing on feet; pain in feet and back are worse now than when he started coming to pain clinic Timing: Constant Modifying factors: medications BP: (!) 129/92  HR: 81  Ricky Mercer was last scheduled for an appointment on 03/22/2018 for medication management. During today's appointment we reviewed [name] chronic pain status, as well as his outpatient medication regimen.  The patient  reports that he  does not use drugs. His body mass index is 27.62 kg/m.  Further details on both, my assessment(s), as well as the proposed treatment plan, please see below.  Controlled Substance Pharmacotherapy Assessment REMS (Risk Evaluation and Mitigation Strategy)  Analgesic:Oxycodone IR 87m every 8 hours (1564mday) MME/day:22.64m48may. Ricky Mercer  06/22/2018 12:03 PM  Sign at close encounter Nursing Pain Medication Assessment:  Safety precautions to be maintained throughout the outpatient stay will include: orient to surroundings, keep bed in low position, maintain call bell within reach at all times, provide assistance with transfer out of bed and ambulation.  Medication Inspection Compliance: Pill count conducted under aseptic conditions, in front of the patient. Neither the pills nor the bottle was removed from the patient's sight at any time. Once count was completed pills were immediately returned to the patient in their original bottle.  Medication: Oxycodone IR Pill/Patch Count: 18 of 90 pills remain Pill/Patch Appearance: Markings consistent with prescribed medication Bottle Appearance: Standard pharmacy container. Clearly labeled. Filled Date: 9 /9510 / 2019 Last Medication intake:  Today   Pharmacokinetics: Liberation and absorption (onset of action): WNL Distribution (time to peak effect): WNL Metabolism and excretion (duration of action): WNL         Pharmacodynamics: Desired effects: Analgesia: [name] >50% benefit. Functional ability: Patient reports that medication allows him to accomplish basic ADLs Clinically meaningful improvement in function (CMIF): Sustained CMIF goals met Perceived effectiveness: Described as relatively effective, allowing for increase in activities of daily living (ADL) Undesirable effects: Side-effects or Adverse reactions:  None reported Monitoring: [place] PMP: Online review of the past 12-49-monthiod conducted. Compliant with practice rules and regulations Last UDS on record: Summary  Date Value Ref Range Status  05/04/2018 FINAL  Final    Comment:    ==================================================================== TOXASSURE SELECT 13 (MW) ==================================================================== Test                             Result       Flag       Units Drug Present and Declared for Prescription Verification   Oxycodone                      137          EXPECTED   ng/mg creat   Oxymorphone                    100          EXPECTED   ng/mg creat   Noroxycodone                   140          EXPECTED   ng/mg creat   Noroxymorphone                 43           EXPECTED   ng/mg creat    Sources of oxycodone are scheduled prescription medications.    Oxymorphone, noroxycodone, and noroxymorphone are expected    metabolites of oxycodone. Oxymorphone is also available as a    scheduled prescription medication. ==================================================================== Test                      Result    Flag   Units      Ref Range   Creatinine  202              mg/dL      >=20 ==================================================================== Declared Medications:  The flagging and interpretation on this report are based on the  following declared medications.  Unexpected results may arise from  inaccuracies in the declared medications.  **Note: The testing scope of this panel includes these medications:  Oxycodone  **Note: The testing scope of this panel does not include following  reported medications:  Aspirin (Aspirin 81)  Cholecalciferol  Pantoprazole (Protonix)  Prednisone (Deltasone)  Simvastatin (Zocor) ==================================================================== For clinical consultation, please call [phone]. ====================================================================    UDS interpretation: Compliant          Medication Assessment Form: Reviewed. Patient indicates being compliant with therapy Treatment compliance: Compliant Risk Assessment Profile: Aberrant behavior: See prior evaluations. None observed or detected today Comorbid factors increasing risk of overdose: See prior notes. No additional risks detected today Opioid risk tool (ORT) (Total Score): 3 Personal History of Substance Abuse (SUD-Substance use disorder):  Alcohol: Negative  Illegal Drugs: Negative  Rx Drugs: Negative  ORT Risk Level calculation: Low Risk Risk of substance use disorder (SUD): Low Opioid Risk Tool - 06/22/18 1204      Family History of Substance Abuse   Alcohol  Positive Male    Illegal Drugs  Negative    Rx Drugs  Negative      Personal History of Substance Abuse   Alcohol  Negative    Illegal Drugs  Negative    Rx Drugs  Negative      Age   Age between 62-45 years   No      History of Preadolescent Sexual Abuse   History of Preadolescent Sexual Abuse  Negative or Male      Psychological Disease   Psychological Disease  Negative    Depression  Negative      Total Score   Opioid Risk Tool Scoring  3    Opioid Risk Interpretation  Low Risk      ORT Scoring interpretation table:  Score <3 = Low Risk for SUD  Score between 4-7 = Moderate Risk for SUD  Score >8 = High Risk for Opioid Abuse   Risk Mitigation Strategies:  Patient Counseling: Covered Patient-Prescriber Agreement (PPA): Present and active  Notification to other healthcare providers: Done  Pharmacologic Plan: No change in therapy, at this time.             Laboratory Chemistry  Inflammation Markers (CRP: Acute Phase) (ESR: Chronic Phase) Lab Results  Component Value Date   CRP <1 05/04/2018   ESRSEDRATE 5 05/04/2018                         Rheumatology Markers No results found for: RF, ANA,  LABURIC, URICUR, LYMEIGGIGMAB, LYMEABIGMQN, HLAB27                      Renal Function Markers Lab Results  Component Value Date   BUN 11 05/04/2018   CREATININE 0.96 05/04/2018   BCR 11 05/04/2018   GFRAA 96 05/04/2018   GFRNONAA 83 05/04/2018                             Hepatic Function Markers Lab Results  Component Value Date   AST 19 05/04/2018   ALT 41 11/25/2015   ALBUMIN 4.3 05/04/2018   ALKPHOS 60 05/04/2018  Electrolytes Lab Results  Component Value Date   NA 142 05/04/2018   K 4.3 05/04/2018   CL 103 05/04/2018   CALCIUM 9.4 05/04/2018   MG 1.9 05/04/2018                        Neuropathy Markers Lab Results  Component Value Date   DIXVEZBM15 868 05/04/2018                        CNS Tests No results found for: COLORCSF, APPEARCSF, RBCCOUNTCSF, WBCCSF, POLYSCSF, LYMPHSCSF, EOSCSF, PROTEINCSF, GLUCCSF, JCVIRUS, CSFOLI, IGGCSF                      Bone Pathology Markers Lab Results  Component Value Date   25OHVITD1 85 05/04/2018   25OHVITD2 <1.0 05/04/2018   25OHVITD3 85 05/04/2018                         Coagulation Parameters Lab Results  Component Value Date   PLT 238 11/25/2015                        Cardiovascular Markers Lab Results  Component Value Date   HGB 14.6 11/25/2015   HCT 43.5 11/25/2015                         CA Markers No results found for: CEA, CA125, LABCA2                      Note: Lab results reviewed.  Recent Diagnostic Imaging Results  DG Cervical Spine With Flex & Extend CLINICAL DATA:  Left-sided neck pain with no known trauma.  EXAM: CERVICAL SPINE COMPLETE WITH FLEXION AND EXTENSION VIEWS  COMPARISON:  None.  FINDINGS: The lateral views in neutral, flexed, and extension positions only visualizes cervical spine of the bottom of C6. No malalignment or instability identified through these levels. The neural foramina are patent. The lateral masses of C1 align with C2. The odontoid  process is unremarkable. C7 and T1 are normal on the swimmer's view.  IMPRESSION: Mild multilevel degenerative disc disease with small anterior osteophytes most marked at C4-5 and C5-6. No other abnormalities.  Electronically Signed   By: Dorise Bullion III M.D   On: 04/10/2018 09:40 MR THORACIC SPINE WO CONTRAST CLINICAL DATA:  Mid back pain  EXAM: MRI THORACIC SPINE WITHOUT CONTRAST  TECHNIQUE: Multiplanar, multisequence MR imaging of the thoracic spine was performed. No intravenous contrast was administered.  COMPARISON:  None.  FINDINGS: Alignment:  Normal  Vertebrae: Negative for fracture or mass. Multiple Schmorl's nodes are present related disc degeneration. Hemangioma T11 vertebral body on the left.  Cord:  Normal signal and morphology.  Negative for cord compression  Paraspinal and other soft tissues: Negative for paraspinous mass or fluid collection  Disc levels:  Multilevel degenerative changes are present in the thoracic spine. No disc protrusion or significant spinal stenosis. Multiple Schmorl's nodes are present in the midthoracic spine and at T11-T12. No significant spurring.  IMPRESSION: Multilevel mild degenerative changes in the thoracic spine. Negative for disc protrusion or stenosis.  Electronically Signed   By: Franchot Gallo M.D.   On: 04/10/2018 09:29  Complexity Note: Imaging results reviewed. Results shared with Mr. Febus, using Layman's terms.  Meds   Current Outpatient Medications:  .  aspirin 81 MG tablet, Take 81 mg by mouth daily., Disp: , Rfl:  .  Cholecalciferol 5000 units capsule, Take 1 capsule (5,000 Units total) by mouth daily., Disp: 90 capsule, Rfl: 0 .  [START ON 08/27/2018] oxyCODONE (OXY IR/ROXICODONE) 5 MG immediate release tablet, Take 1 tablet (5 mg total) by mouth every 8 (eight) hours as needed for severe pain., Disp: 90 tablet, Rfl: 0 .  pantoprazole (PROTONIX) 40 MG tablet, Take 40 mg  by mouth daily., Disp: , Rfl:  .  simvastatin (ZOCOR) 10 MG tablet, Take 10 mg by mouth at bedtime. , Disp: , Rfl:  .  [START ON 07/28/2018] oxyCODONE (OXY IR/ROXICODONE) 5 MG immediate release tablet, Take 1 tablet (5 mg total) by mouth every 8 (eight) hours as needed for severe pain., Disp: 90 tablet, Rfl: 0 .  [START ON 06/28/2018] oxyCODONE (OXY IR/ROXICODONE) 5 MG immediate release tablet, Take 1 tablet (5 mg total) by mouth every 8 (eight) hours as needed for severe pain., Disp: 90 tablet, Rfl: 0  ROS  Constitutional: Denies any fever or chills Gastrointestinal: No reported hemesis, hematochezia, vomiting, or acute GI distress Musculoskeletal: Denies any acute onset joint swelling, redness, loss of ROM, or weakness Neurological: No reported episodes of acute onset apraxia, aphasia, dysarthria, agnosia, amnesia, paralysis, loss of coordination, or loss of consciousness  Allergies  Mr. Christiano has No Known Allergies.  PFSH  Drug: Mr. Carew  reports that he does not use drugs. Alcohol:  reports that he does not drink alcohol. Tobacco:  reports that he has never smoked. He has never used smokeless tobacco. Medical:  has a past medical history of Acute postoperative pain (03/21/2017), Bilateral arm pain, Cardiomyopathy ([place]), Diverticulosis, Dupuytren's contracture, Erosive gastritis, Foot pain, bilateral, GERD (gastroesophageal reflux disease), Hand weakness, Hyperlipidemia, Knee pain, Midline low back pain, Osteoarthritis, Pain in neck, Prostate cancer ([place]), Reactive airway disease, Seasonal allergies, Situational anxiety, and Sleep apnea. Surgical: Mr. Hanrahan  has a past surgical history that includes Nose surgery; Hand surgery (Bilateral); Colonoscopy (10/24/2012); Upper gi endoscopy (12/18/2012); Tonsillectomy (2009); Skin cancer excision (Left, 2015); Mass excision (Right, 07/18/2015); Esophagogastroduodenoscopy (egd) with propofol (N/A, 10/28/2015); prostate seeding; Hernia repair;  Cholecystectomy (N/A, 12/02/2015); Esophagogastroduodenoscopy (N/A, 11/17/2017); and Colonoscopy with propofol (N/A, 11/17/2017). Family: family history includes Colon cancer in his brother; Hyperlipidemia in his sister; Lung cancer in his father; Prostate cancer in his brother.  Constitutional Exam  General appearance: Well nourished, well developed, and well hydrated. In no apparent acute distress Vitals:   06/22/18 1153  BP: (!) 129/92  Pulse: 81  Resp: 16  Temp: 98 F (36.7 C)  TempSrc: Oral  SpO2: 99%  Weight: 187 lb (84.8 kg)  Height: 5' 9" (1.753 m)   BMI Assessment: Estimated body mass index is 27.62 kg/m as calculated from the following:   Height as of this encounter: 5' 9" (1.753 m).   Weight as of this encounter: 187 lb (84.8 kg). Psych/Mental status: Alert, oriented x 3 (person, place, & time)       Eyes: PERLA Respiratory: No evidence of acute respiratory distress  Lumbar Spine Area Exam  Skin & Axial Inspection: No masses, redness, or swelling Alignment: Symmetrical Functional ROM: Unrestricted ROM       Stability: No instability detected Muscle Tone/Strength: Functionally intact. No obvious neuro-muscular anomalies detected. Sensory (Neurological): Unimpaired Palpation: No palpable anomalies       Provocative Tests: Hyperextension/rotation test: deferred today  Lumbar quadrant test (Kemp's test): deferred today       Lateral bending test: deferred today       Patrick's Maneuver: deferred today                   FABER test: deferred today                   S-I anterior distraction/compression test: deferred today         S-I lateral compression test: deferred today         S-I Thigh-thrust test: deferred today         S-I Gaenslen's test: deferred today          Gait & Posture Assessment  Ambulation: Unassisted Gait: Relatively normal for age and body habitus Posture: WNL   Lower Extremity Exam    Side: Right lower extremity  Side: Left lower extremity   Stability: No instability observed          Stability: No instability observed          Skin & Extremity Inspection: Skin color, temperature, and hair growth are WNL. No peripheral edema or cyanosis. No masses, redness, swelling, asymmetry, or associated skin lesions. No contractures.  Skin & Extremity Inspection: Skin color, temperature, and hair growth are WNL. No peripheral edema or cyanosis. No masses, redness, swelling, asymmetry, or associated skin lesions. No contractures.  Functional ROM: Unrestricted ROM                  Functional ROM: Unrestricted ROM                  Muscle Tone/Strength: Functionally intact. No obvious neuro-muscular anomalies detected.  Muscle Tone/Strength: Functionally intact. No obvious neuro-muscular anomalies detected.  Sensory (Neurological): Unimpaired  Sensory (Neurological): Unimpaired  Palpation: No palpable anomalies  Palpation: No palpable anomalies   Assessment  Primary Diagnosis & Pertinent Problem List: The primary encounter diagnosis was Lumbar spondylosis. Diagnoses of DDD (degenerative disc disease), lumbosacral, DDD (degenerative disc disease), cervical, and Chronic pain syndrome were also pertinent to this visit.  Status Diagnosis  Controlled Controlled Controlled 1. Lumbar spondylosis   2. DDD (degenerative disc disease), lumbosacral   3. DDD (degenerative disc disease), cervical   4. Chronic pain syndrome     Problems updated and reviewed during this visit: Problem  Decreased Sensation  Numbness and Tingling  Weakness of Left Leg  Heart Palpitations   Plan of Care  Pharmacotherapy (Medications Ordered): Meds ordered this encounter  Medications  . oxyCODONE (OXY IR/ROXICODONE) 5 MG immediate release tablet    Sig: Take 1 tablet (5 mg total) by mouth every 8 (eight) hours as needed for severe pain.    Dispense:  90 tablet    Refill:  0    Do not add this medication to the electronic "Automatic Refill" notification system.  Patient may have prescription filled one day early if pharmacy is closed on scheduled refill date.    Order Specific Question:   Supervising Provider    Answer:   Milinda Pointer [phone]  . oxyCODONE (OXY IR/ROXICODONE) 5 MG immediate release tablet    Sig: Take 1 tablet (5 mg total) by mouth every 8 (eight) hours as needed for severe pain.    Dispense:  90 tablet    Refill:  0    Do not add this medication to the electronic "Automatic Refill" notification system. Patient may have prescription filled one day early if pharmacy is  closed on scheduled refill date.    Order Specific Question:   Supervising Provider    Answer:   Milinda Pointer [phone]  . oxyCODONE (OXY IR/ROXICODONE) 5 MG immediate release tablet    Sig: Take 1 tablet (5 mg total) by mouth every 8 (eight) hours as needed for severe pain.    Dispense:  90 tablet    Refill:  0    Do not add this medication to the electronic "Automatic Refill" notification system. Patient may have prescription filled one day early if pharmacy is closed on scheduled refill date.    Order Specific Question:   Supervising Provider    Answer:   Milinda Pointer [[id number]]   New Prescriptions   No medications on file   Medications administered today: Delane Ginger had no medications administered during this visit. Lab-work, procedure(s), and/or referral(s): No orders of the defined types were placed in this encounter.  Imaging and/or referral(s): None  Interventional management options: Planned, scheduled, and/or pending:   ScheduleRightSI + Lumbar Facet RFA.   Considering:   Palliative Left cervical epidural steroid injection  Possible bilateral sacroiliac joint + lumbar facet RFA Diagnostic Bilateral HipBlock Diagnostic bilateral femoral nerve and obturator nerve block Possible bilateral femoral nerve and obturator nerve RFA Diagnostic bilateral cervical facet block. Possible bilateral cervical facet RFA. Diagnostic  Caudal ESI + diagnostic epidurogram Diagnostic Right Knee injection. Possible series of 5 right-sided intra-articular [place] injections Diagnostic right Genicular NB Possible right Genicular Nerve RFA. Diagnostic bilateral intra-articular shoulderinjection Diagnostic bilateral suprascapular NB. Possible bilateral suprascapular Nerve RFA.   Palliative PRN treatment(s):   Diagnostic Bilateral Lumbar Facet & Bilateral S-I joint block Diagnostic Bilateral Hip Block Diagnostic bilateral cervical facetblock. Diagnostic Caudal ESI. Diagnostic Left CESI. Diagnostic Right Knee injection. Diagnostic right Genicular NB Diagnostic bilateral intra-articular shoulderinj. Diagnostic bilateral suprascapular NB.    Provider-requested follow-up: Return in about 3 months (around 09/22/2018) for MedMgmt.  Future Appointments  Date Time Provider [place]  09/20/2018  9:45 AM Vevelyn Francois, NP [facility] None   Primary Care Physician: Sofie Hartigan, MD Location[facility] Note by: Vevelyn Francois NP Date: 06/22/2018; Time: 4:04 PM  Pain Score Disclaimer: We use the NRS-11 scale. This is a self-reported, subjective measurement of pain severity with only modest accuracy. It is used primarily to identify changes within a particular patient. It must be understood that outpatient pain scales are significantly less accurate that those used for research, where they can be applied under ideal controlled circumstances with minimal exposure to variables. In reality, the score is likely to be a combination of pain intensity and pain affect, where pain affect describes the degree of emotional arousal or changes in action readiness caused by the sensory experience of pain. Factors such as social and work situation, setting, emotional state, anxiety levels, expectation, and prior pain experience may influence pain perception and show large inter-individual  differences that may also be affected by time variables.  Patient instructions provided during this appointment: Patient Instructions  3 Rx for Oxycodone to last until 09/26/2018 has been escribed to your pharmacy.____________________________________________________________________________________________  Medication Rules  Applies to: All patients receiving prescriptions (written or electronic).  Pharmacy of record: Pharmacy where electronic prescriptions will be sent. If written prescriptions are taken to a different pharmacy, please inform the nursing staff. The pharmacy listed in the electronic medical record should be the one where you would like electronic prescriptions to be sent.  Prescription refills: Only during scheduled appointments.  Applies to both, written and electronic prescriptions.  NOTE: The following applies primarily to controlled substances (Opioid* Pain Medications).   Patient's responsibilities: 1. Pain Pills: Bring all pain pills to every appointment (except for procedure appointments). 2. Pill Bottles: Bring pills in original pharmacy bottle. Always bring newest bottle. Bring bottle, even if empty. 3. Medication refills: You are responsible for knowing and keeping track of what medications you need refilled. The day before your appointment, write a list of all prescriptions that need to be refilled. Bring that list to your appointment and give it to the admitting nurse. Prescriptions will be written only during appointments. If you forget a medication, it will not be "Called in", "Faxed", or "electronically sent". You will need to get another appointment to get these prescribed. 4. Prescription Accuracy: You are responsible for carefully inspecting your prescriptions before leaving our office. Have the discharge nurse carefully go over each prescription with you, before taking them home. Make sure that your name is accurately spelled, that your address is correct. Check  the name and dose of your medication to make sure it is accurate. Check the number of pills, and the written instructions to make sure they are clear and accurate. Make sure that you are given enough medication to last until your next medication refill appointment. 5. Taking Medication: Take medication as prescribed. Never take more pills than instructed. Never take medication more frequently than prescribed. Taking less pills or less frequently is permitted and encouraged, when it comes to controlled substances (written prescriptions).  6. Inform other Doctors: Always inform, all of your healthcare providers, of all the medications you take. 7. Pain Medication from other Providers: You are not allowed to accept any additional pain medication from any other Doctor or Healthcare provider. There are two exceptions to this rule. (see below) In the event that you require additional pain medication, you are responsible for notifying us, as stated below. 8. Medication Agreement: You are responsible for carefully reading and following our Medication Agreement. This must be signed before receiving any prescriptions from our practice. Safely store a copy of your signed Agreement. Violations to the Agreement will result in no further prescriptions. (Additional copies of our Medication Agreement are available upon request.) 9. Laws, Rules, & Regulations: All patients are expected to follow all Federal and [employer], [employer], Rules, [employer]. Ignorance of the Laws does not constitute a valid excuse. The use of any illegal substances is prohibited. 10. Adopted CDC guidelines & recommendations: Target dosing levels will be at or below 60 MME/day. Use of benzodiazepines** is not recommended.  Exceptions: There are only two exceptions to the rule of not receiving pain medications from other Healthcare Providers. 1. Exception #1 (Emergencies): In the event of an emergency (i.e.: accident requiring emergency care),  you are allowed to receive additional pain medication. However, you are responsible for: As soon as you are able, call our office [phone], at any time of the day or night, and leave a message stating your name, the date and nature of the emergency, and the name and dose of the medication prescribed. In the event that your call is answered by a member of our staff, make sure to document and save the date, time, and the name of the person that took your information.  2. Exception #2 (Planned Surgery): In the event that you are scheduled by another doctor or dentist to have any type of surgery or procedure, you are allowed (for a period no  longer than 30 days), to receive additional pain medication, for the acute post-op pain. However, in this case, you are responsible for picking up a copy of our "Post-op Pain Management for Surgeons" handout, and giving it to your surgeon or dentist. This document is available at our office, and does not require an appointment to obtain it. Simply go to our office during business hours (Monday-Thursday from 8:00 AM to 4:00 PM) (Friday 8:00 AM to 12:00 Noon) or if you have a scheduled appointment with [place], prior to your surgery, and ask for it by name. In addition, you will need to provide [place] with your name, name of your surgeon, type of surgery, and date of procedure or surgery.  *Opioid medications include: morphine, codeine, oxycodone, oxymorphone, hydrocodone, hydromorphone, meperidine, tramadol, tapentadol, buprenorphine, fentanyl, methadone. **Benzodiazepine medications include: diazepam (Valium), alprazolam (Xanax), clonazepam (Klonopine), lorazepam (Ativan), clorazepate (Tranxene), chlordiazepoxide (Librium), estazolam (Prosom), oxazepam (Serax), temazepam (Restoril), triazolam (Halcion) (Last updated: 11/10/2017) ____________________________________________________________________________________________

## 2018-06-22 NOTE — Patient Instructions (Addendum)
3 Rx for Oxycodone to last until 09/26/2018 has been escribed to your pharmacy.____________________________________________________________________________________________  Medication Rules  Applies to: All patients receiving prescriptions (written or electronic).  Pharmacy of record: Pharmacy where electronic prescriptions will be sent. If written prescriptions are taken to a different pharmacy, please inform the nursing staff. The pharmacy listed in the electronic medical record should be the one where you would like electronic prescriptions to be sent.  Prescription refills: Only during scheduled appointments. Applies to both, written and electronic prescriptions.  NOTE: The following applies primarily to controlled substances (Opioid* Pain Medications).   Patient's responsibilities: 1. Pain Pills: Bring all pain pills to every appointment (except for procedure appointments). 2. Pill Bottles: Bring pills in original pharmacy bottle. Always bring newest bottle. Bring bottle, even if empty. 3. Medication refills: You are responsible for knowing and keeping track of what medications you need refilled. The day before your appointment, write a list of all prescriptions that need to be refilled. Bring that list to your appointment and give it to the admitting nurse. Prescriptions will be written only during appointments. If you forget a medication, it will not be "Called in", "Faxed", or "electronically sent". You will need to get another appointment to get these prescribed. 4. Prescription Accuracy: You are responsible for carefully inspecting your prescriptions before leaving our office. Have the discharge nurse carefully go over each prescription with you, before taking them home. Make sure that your name is accurately spelled, that your address is correct. Check the name and dose of your medication to make sure it is accurate. Check the number of pills, and the written instructions to make sure they  are clear and accurate. Make sure that you are given enough medication to last until your next medication refill appointment. 5. Taking Medication: Take medication as prescribed. Never take more pills than instructed. Never take medication more frequently than prescribed. Taking less pills or less frequently is permitted and encouraged, when it comes to controlled substances (written prescriptions).  6. Inform other Doctors: Always inform, all of your healthcare providers, of all the medications you take. 7. Pain Medication from other Providers: You are not allowed to accept any additional pain medication from any other Doctor or Healthcare provider. There are two exceptions to this rule. (see below) In the event that you require additional pain medication, you are responsible for notifying us, as stated below. 8. Medication Agreement: You are responsible for carefully reading and following our Medication Agreement. This must be signed before receiving any prescriptions from our practice. Safely store a copy of your signed Agreement. Violations to the Agreement will result in no further prescriptions. (Additional copies of our Medication Agreement are available upon request.) 9. Laws, Rules, & Regulations: All patients are expected to follow all Federal and [employer], [employer], Rules, [employer]. Ignorance of the Laws does not constitute a valid excuse. The use of any illegal substances is prohibited. 10. Adopted CDC guidelines & recommendations: Target dosing levels will be at or below 60 MME/day. Use of benzodiazepines** is not recommended.  Exceptions: There are only two exceptions to the rule of not receiving pain medications from other Healthcare Providers. 1. Exception #1 (Emergencies): In the event of an emergency (i.e.: accident requiring emergency care), you are allowed to receive additional pain medication. However, you are responsible for: As soon as you are able, call our office [phone]  [phone], at any time of the day or night, and leave a message stating your name, the date  and nature of the emergency, and the name and dose of the medication prescribed. In the event that your call is answered by a member of our staff, make sure to document and save the date, time, and the name of the person that took your information.  2. Exception #2 (Planned Surgery): In the event that you are scheduled by another doctor or dentist to have any type of surgery or procedure, you are allowed (for a period no longer than 30 days), to receive additional pain medication, for the acute post-op pain. However, in this case, you are responsible for picking up a copy of our "Post-op Pain Management for Surgeons" handout, and giving it to your surgeon or dentist. This document is available at our office, and does not require an appointment to obtain it. Simply go to our office during business hours (Monday-Thursday from 8:00 AM to 4:00 PM) (Friday 8:00 AM to 12:00 Noon) or if you have a scheduled appointment with [place], prior to your surgery, and ask for it by name. In addition, you will need to provide [place] with your name, name of your surgeon, type of surgery, and date of procedure or surgery.  *Opioid medications include: morphine, codeine, oxycodone, oxymorphone, hydrocodone, hydromorphone, meperidine, tramadol, tapentadol, buprenorphine, fentanyl, methadone. **Benzodiazepine medications include: diazepam (Valium), alprazolam (Xanax), clonazepam (Klonopine), lorazepam (Ativan), clorazepate (Tranxene), chlordiazepoxide (Librium), estazolam (Prosom), oxazepam (Serax), temazepam (Restoril), triazolam (Halcion) (Last updated: 11/10/2017) ____________________________________________________________________________________________

## 2018-06-27 ENCOUNTER — Other Ambulatory Visit: Payer: Self-pay | Admitting: Neurology

## 2018-06-27 DIAGNOSIS — M545 Low back pain, unspecified: Secondary | ICD-10-CM

## 2018-07-06 ENCOUNTER — Ambulatory Visit: Payer: Medicare Other | Attending: Internal Medicine

## 2018-07-06 DIAGNOSIS — G4733 Obstructive sleep apnea (adult) (pediatric): Secondary | ICD-10-CM | POA: Diagnosis not present

## 2018-07-12 ENCOUNTER — Ambulatory Visit
Admission: RE | Admit: 2018-07-12 | Discharge: 2018-07-12 | Disposition: A | Discharge status: Home / Self Care | Payer: Medicare Other | Source: Ambulatory Visit | Attending: Neurology | Admitting: Neurology

## 2018-07-12 DIAGNOSIS — R531 Weakness: Secondary | ICD-10-CM | POA: Insufficient documentation

## 2018-07-12 DIAGNOSIS — G8929 Other chronic pain: Secondary | ICD-10-CM | POA: Insufficient documentation

## 2018-07-12 DIAGNOSIS — R208 Other disturbances of skin sensation: Secondary | ICD-10-CM | POA: Insufficient documentation

## 2018-07-12 DIAGNOSIS — R200 Anesthesia of skin: Secondary | ICD-10-CM | POA: Insufficient documentation

## 2018-07-12 DIAGNOSIS — M545 Low back pain, unspecified: Secondary | ICD-10-CM

## 2018-07-12 DIAGNOSIS — R202 Paresthesia of skin: Secondary | ICD-10-CM | POA: Insufficient documentation

## 2018-07-12 DIAGNOSIS — M47816 Spondylosis without myelopathy or radiculopathy, lumbar region: Secondary | ICD-10-CM | POA: Insufficient documentation

## 2018-07-26 DIAGNOSIS — M79671 Pain in right foot: Secondary | ICD-10-CM

## 2018-07-26 DIAGNOSIS — M79605 Pain in left leg: Secondary | ICD-10-CM

## 2018-07-26 DIAGNOSIS — M79604 Pain in right leg: Secondary | ICD-10-CM | POA: Insufficient documentation

## 2018-07-26 DIAGNOSIS — M79672 Pain in left foot: Secondary | ICD-10-CM

## 2018-09-20 ENCOUNTER — Other Ambulatory Visit: Payer: Self-pay

## 2018-09-20 ENCOUNTER — Ambulatory Visit: Payer: Medicare Other | Attending: Nurse Practitioner | Admitting: Nurse Practitioner

## 2018-09-20 ENCOUNTER — Encounter: Payer: Self-pay | Admitting: Nurse Practitioner

## 2018-09-20 VITALS — BP 122/92 | HR 79 | Temp 98.8°F | Resp 16 | Ht 69.0 in | Wt 187.0 lb

## 2018-09-20 DIAGNOSIS — M5416 Radiculopathy, lumbar region: Secondary | ICD-10-CM | POA: Diagnosis not present

## 2018-09-20 DIAGNOSIS — G894 Chronic pain syndrome: Secondary | ICD-10-CM | POA: Diagnosis not present

## 2018-09-20 DIAGNOSIS — M25561 Pain in right knee: Secondary | ICD-10-CM | POA: Diagnosis present

## 2018-09-20 DIAGNOSIS — G8929 Other chronic pain: Secondary | ICD-10-CM | POA: Insufficient documentation

## 2018-09-20 DIAGNOSIS — M47812 Spondylosis without myelopathy or radiculopathy, cervical region: Secondary | ICD-10-CM | POA: Diagnosis present

## 2018-09-20 MED ORDER — OXYCODONE HCL 5 MG PO TABS: 5.0000 mg | ORAL_TABLET | Freq: Three times a day (TID) | ORAL | 0 refills | Status: DC | PRN

## 2018-09-20 MED ORDER — GABAPENTIN 100 MG PO CAPS: 100.0000 mg | ORAL_CAPSULE | Freq: Four times a day (QID) | ORAL | 2 refills | Status: DC

## 2018-09-20 NOTE — Progress Notes (Signed)
Nursing Pain Medication Assessment:  Safety precautions to be maintained throughout the outpatient stay will include: orient to surroundings, keep bed in low position, maintain call bell within reach at all times, provide assistance with transfer out of bed and ambulation.  Medication Inspection Compliance: Pill count conducted under aseptic conditions, in front of the patient. Neither the pills nor the bottle was removed from the patient's sight at any time. Once count was completed pills were immediately returned to the patient in their original bottle.  Medication: Oxycodone IR Pill/Patch Count: 19 of 90 pills remain Pill/Patch Appearance: Markings consistent with prescribed medication Bottle Appearance: Standard pharmacy container. Clearly labeled. Filled Date: 12/17 / 2019 Last Medication intake:  Today

## 2018-09-20 NOTE — Patient Instructions (Addendum)
____________________________________________________________________________________________  Medication Rules  Purpose: To inform patients, and their family members, of our rules and regulations.  Applies to: All patients receiving prescriptions (written or electronic).  Pharmacy of record: Pharmacy where electronic prescriptions will be sent. If written prescriptions are taken to a different pharmacy, please inform the nursing staff. The pharmacy listed in the electronic medical record should be the one where you would like electronic prescriptions to be sent.  Electronic prescriptions: In compliance with the [place] Strengthen Opioid Misuse Prevention (STOP) Act of 2017 (Session Law 2017-74/H243), effective September 13, 2018, all controlled substances must be electronically prescribed. Calling prescriptions to the pharmacy will cease to exist.  Prescription refills: Only during scheduled appointments. Applies to all prescriptions.  NOTE: The following applies primarily to controlled substances (Opioid* Pain Medications).   Patient's responsibilities: 1. Pain Pills: Bring all pain pills to every appointment (except for procedure appointments). 2. Pill Bottles: Bring pills in original pharmacy bottle. Always bring the newest bottle. Bring bottle, even if empty. 3. Medication refills: You are responsible for knowing and keeping track of what medications you take and those you need refilled. The day before your appointment: write a list of all prescriptions that need to be refilled. The day of the appointment: give the list to the admitting nurse. Prescriptions will be written only during appointments. If you forget a medication: it will not be "Called in", "Faxed", or "electronically sent". You will need to get another appointment to get these prescribed. No early refills. Do not call asking to have your prescription filled early. 4. Prescription Accuracy: You are responsible for  carefully inspecting your prescriptions before leaving our office. Have the discharge nurse carefully go over each prescription with you, before taking them home. Make sure that your name is accurately spelled, that your address is correct. Check the name and dose of your medication to make sure it is accurate. Check the number of pills, and the written instructions to make sure they are clear and accurate. Make sure that you are given enough medication to last until your next medication refill appointment. 5. Taking Medication: Take medication as prescribed. When it comes to controlled substances, taking less pills or less frequently than prescribed is permitted and encouraged. Never take more pills than instructed. Never take medication more frequently than prescribed.  6. Inform other Doctors: Always inform, all of your healthcare providers, of all the medications you take. 7. Pain Medication from other Providers: You are not allowed to accept any additional pain medication from any other Doctor or Healthcare provider. There are two exceptions to this rule. (see below) In the event that you require additional pain medication, you are responsible for notifying us, as stated below. 8. Medication Agreement: You are responsible for carefully reading and following our Medication Agreement. This must be signed before receiving any prescriptions from our practice. Safely store a copy of your signed Agreement. Violations to the Agreement will result in no further prescriptions. (Additional copies of our Medication Agreement are available upon request.) 9. Laws, Rules, & Regulations: All patients are expected to follow all Federal and State Laws, Statutes, Rules, & Regulations. Ignorance of the Laws does not constitute a valid excuse. The use of any illegal substances is prohibited. 10. Adopted CDC guidelines & recommendations: Target dosing levels will be at or below 60 MME/day. Use of benzodiazepines** is not  recommended.  Exceptions: There are only two exceptions to the rule of not receiving pain medications from other Healthcare Providers. 1.   Exception #1 (Emergencies): In the event of an emergency (i.e.: accident requiring emergency care), you are allowed to receive additional pain medication. However, you are responsible for: As soon as you are able, call our office [phone], at any time of the day or night, and leave a message stating your name, the date and nature of the emergency, and the name and dose of the medication prescribed. In the event that your call is answered by a member of our staff, make sure to document and save the date, time, and the name of the person that took your information.  2. Exception #2 (Planned Surgery): In the event that you are scheduled by another doctor or dentist to have any type of surgery or procedure, you are allowed (for a period no longer than 30 days), to receive additional pain medication, for the acute post-op pain. However, in this case, you are responsible for picking up a copy of our "Post-op Pain Management for Surgeons" handout, and giving it to your surgeon or dentist. This document is available at our office, and does not require an appointment to obtain it. Simply go to our office during business hours (Monday-Thursday from 8:00 AM to 4:00 PM) (Friday 8:00 AM to 12:00 Noon) or if you have a scheduled appointment with [place], prior to your surgery, and ask for it by name. In addition, you will need to provide [place] with your name, name of your surgeon, type of surgery, and date of procedure or surgery.  *Opioid medications include: morphine, codeine, oxycodone, oxymorphone, hydrocodone, hydromorphone, meperidine, tramadol, tapentadol, buprenorphine, fentanyl, methadone. **Benzodiazepine medications include: diazepam (Valium), alprazolam (Xanax), clonazepam (Klonopine), lorazepam (Ativan), clorazepate (Tranxene), chlordiazepoxide (Librium), estazolam (Prosom),  oxazepam (Serax), temazepam (Restoril), triazolam (Halcion) (Last updated: 11/10/2017) ____________________________________________________________________________________________   Oxycodone 5 mg x 3 months escribed to your pharmacy fill dates are:  09/26/18, 10/26/18, 11/25/18  Gabapentin 100 mg bid escribed to pharmacy

## 2018-09-20 NOTE — Progress Notes (Signed)
Patient's Name: Ricky Mercer  MRN: [id number]  Referring Provider: Sofie Hartigan, MD  DOB: 1951-08-08  PCP: Sofie Hartigan, MD  DOS: 09/20/2018  Note by: Vevelyn Francois NP  Service setting: Ambulatory outpatient  Specialty: Interventional Pain Management  Location: [facility] (AMB) Pain Management Facility    Patient type: Established    Primary Reason(s) for Visit: Encounter for prescription drug management. (Level of risk: moderate)  CC: Back Pain (lower)  HPI  Ricky Mercer is a 68 y.o. year old, male patient, who comes today for a medication management evaluation. He has Hand weakness; Polyradiculopathy; Long term current use of opiate analgesic; Long term prescription opiate use; Opiate use (45 MME/Day); Opiate dependence ([place]); Encounter for therapeutic drug level monitoring; Chronic low back pain  (Primary Pain) (Bilateral) (L>R); Chronic lower extremity pain (Tertiary source of pain) (Bilateral) (L>R); Chronic lumbar radicular pain (Bilateral) (L>R) (Bilateral S1 dermatomal distribution); Chronic hip pain (Secondary source of pain) (Bilateral) (L>R); Chronic upper back pain (Bilateral) (L>R); Chronic neck pain (neck<shoulder) (L>R); Chronic foot pain; Chronic knee pain (Right); Cervical spondylosis; 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); Chronic upper extremity pain (Bilateral) (L>R); Lumbar spondylosis; Cervical facet hypertrophy; Cervical facet syndrome (R>L); History of prostate cancer; Radicular pain of shoulder; Class I Morbid obesity (HCC) (68% higher incidence of chronic low back problems); Peripheral neuropathy (HCC) (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); Lumbar facet syndrome (Bilateral) (L>R); Sleep apnea; Depression; High cholesterol; Low testosterone; History of surgical procedure; Opioid-induced constipation (OIC); Chronic shoulder pain (Bilateral) (L>R); Breathing orally; Chest pain; Primary cardiomyopathy ([place]); Chronic sacroiliac joint pain  (Bilateral) (L>R); Musculoskeletal pain; Pain of right foot; Neurogenic pain; Chest pain with high risk for cardiac etiology; Chronic pain syndrome; Bilateral arm pain; Vitamin D insufficiency; Pharmacologic therapy; Disorder of skeletal system; Problems influencing health status; DDD (degenerative disc disease), cervical; DDD (degenerative disc disease), lumbosacral; Cervicogenic headache; Cervico-occipital neuralgia; Osteoarthritis of spine with radiculopathy, lumbar region; Decreased sensation; Heart palpitations; Numbness and tingling; Weakness of left leg; and Bilateral leg and foot pain on their problem list. His primarily concern today is the Back Pain (lower)  Pain Assessment: Location: Lower Back Radiating: both legs to the feet Onset: More than a month ago Duration: Chronic pain Quality: Burning, Tingling Severity: 3 /10 (subjective, self-reported pain score)  Note: Reported level is compatible with observation.                          Effect on ADL:   Timing: Constant Modifying factors: Oxycodone, Gabapentin BP: (!) 122/92  HR: 79  Ricky Mercer was last scheduled for an appointment on 06/22/2018 for medication management. During today's appointment we reviewed Ricky Mercer chronic pain status, as well as his outpatient medication regimen.  The patient  reports no history of drug use. His body mass index is 27.62 kg/m.  Further details on both, my assessment(s), as well as the proposed treatment plan, please see below.  Controlled Substance Pharmacotherapy Assessment REMS (Risk Evaluation and Mitigation Strategy)  Analgesic:Oxycodone IR 3m every 8 hours (152mday) MME/day:22.49m47may. WheLandis Mercer  09/20/2018 10:13 AM  Sign when Signing Visit Nursing Pain Medication Assessment:  Safety precautions to be maintained throughout the outpatient stay will include: orient to surroundings, keep bed in low position, maintain call bell within reach at all times, provide  assistance with transfer out of bed and ambulation.  Medication Inspection Compliance: Pill count conducted under aseptic conditions, in front of the  patient. Neither the pills nor the bottle was removed from the patient's sight at any time. Once count was completed pills were immediately returned to the patient in their original bottle.  Medication: Oxycodone IR Pill/Patch Count: 19 of 90 pills remain Pill/Patch Appearance: Markings consistent with prescribed medication Bottle Appearance: Standard pharmacy container. Clearly labeled. Filled Date: 12/17 / 2019 Last Medication intake:  Today   Pharmacokinetics: Liberation and absorption (onset of action): WNL Distribution (time to peak effect): WNL Metabolism and excretion (duration of action): WNL         Pharmacodynamics: Desired effects: Analgesia: Ricky Mercer reports >50% benefit. Functional ability: Patient reports that medication allows him to accomplish basic ADLs Clinically meaningful improvement in function (CMIF): Sustained CMIF goals met Perceived effectiveness: Described as relatively effective, allowing for increase in activities of daily living (ADL) Undesirable effects: Side-effects or Adverse reactions: None reported Monitoring: [place] PMP: Online review of the past 65-monthperiod conducted. Compliant with practice rules and regulations Last UDS on record: Summary  Date Value Ref Range Status  05/04/2018 FINAL  Final    Comment:    ==================================================================== TOXASSURE SELECT 13 (MW) ==================================================================== Test                             Result       Flag       Units Drug Present and Declared for Prescription Verification   Oxycodone                      137          EXPECTED   ng/mg creat   Oxymorphone                    100          EXPECTED   ng/mg creat   Noroxycodone                   140          EXPECTED   ng/mg creat    Noroxymorphone                 43           EXPECTED   ng/mg creat    Sources of oxycodone are scheduled prescription medications.    Oxymorphone, noroxycodone, and noroxymorphone are expected    metabolites of oxycodone. Oxymorphone is also available as a    scheduled prescription medication. ==================================================================== Test                      Result    Flag   Units      Ref Range   Creatinine              202              mg/dL      >=20 ==================================================================== Declared Medications:  The flagging and interpretation on this report are based on the  following declared medications.  Unexpected results may arise from  inaccuracies in the declared medications.  **Note: The testing scope of this panel includes these medications:  Oxycodone  **Note: The testing scope of this panel does not include following  reported medications:  Aspirin (Aspirin 81)  Cholecalciferol  Pantoprazole (Protonix)  Prednisone (Deltasone)  Simvastatin (Zocor) ==================================================================== For clinical consultation, please call ([phone] ====================================================================    UDS interpretation: Compliant  Medication Assessment Form: Reviewed. Patient indicates being compliant with therapy Treatment compliance: Compliant Risk Assessment Profile: Aberrant behavior: See prior evaluations. None observed or detected today Comorbid factors increasing risk of overdose: See prior notes. No additional risks detected today Opioid risk tool (ORT) (Total Score): 1 Personal History of Substance Abuse (SUD-Substance use disorder):  Alcohol: Negative  Illegal Drugs: Negative  Rx Drugs: Negative  ORT Risk Level calculation: Low Risk Risk of substance use disorder (SUD): Low Opioid Risk Tool - 09/20/18 1011      Family History of Substance Abuse    Alcohol  Positive Male    Illegal Drugs  Negative    Rx Drugs  Negative      Personal History of Substance Abuse   Alcohol  Negative    Illegal Drugs  Negative    Rx Drugs  Negative      History of Preadolescent Sexual Abuse   History of Preadolescent Sexual Abuse  Negative or Male      Psychological Disease   Psychological Disease  Negative    Depression  Negative      Total Score   Opioid Risk Tool Scoring  1    Opioid Risk Interpretation  Low Risk      ORT Scoring interpretation table:  Score <3 = Low Risk for SUD  Score between 4-7 = Moderate Risk for SUD  Score >8 = High Risk for Opioid Abuse   Risk Mitigation Strategies:  Patient Counseling: Covered Patient-Prescriber Agreement (PPA): Present and active  Notification to other healthcare providers: Done  Pharmacologic Plan: No change in therapy, at this time.             Laboratory Chemistry  Inflammation Markers (CRP: Acute Phase) (ESR: Chronic Phase) Lab Results  Component Value Date   CRP <1 05/04/2018   ESRSEDRATE 5 05/04/2018                         Rheumatology Markers No results found for: RF, ANA, LABURIC, URICUR, LYMEIGGIGMAB, LYMEABIGMQN, HLAB27                      Renal Function Markers Lab Results  Component Value Date   BUN 11 05/04/2018   CREATININE 0.96 05/04/2018   BCR 11 05/04/2018   GFRAA 96 05/04/2018   GFRNONAA 83 05/04/2018                             Hepatic Function Markers Lab Results  Component Value Date   AST 19 05/04/2018   ALT 41 11/25/2015   ALBUMIN 4.3 05/04/2018   ALKPHOS 60 05/04/2018                        Electrolytes Lab Results  Component Value Date   NA 142 05/04/2018   K 4.3 05/04/2018   CL 103 05/04/2018   CALCIUM 9.4 05/04/2018   MG 1.9 05/04/2018                        Neuropathy Markers Lab Results  Component Value Date   VITAMINB12 395 05/04/2018                        CNS Tests No results found for: COLORCSF, APPEARCSF, RBCCOUNTCSF,  WBCCSF, POLYSCSF, LYMPHSCSF, EOSCSF, PROTEINCSF, GLUCCSF, JCVIRUS, CSFOLI, IGGCSF  Bone Pathology Markers Lab Results  Component Value Date   25OHVITD1 85 05/04/2018   25OHVITD2 <1.0 05/04/2018   25OHVITD3 85 05/04/2018                         Coagulation Parameters Lab Results  Component Value Date   PLT 238 11/25/2015                        Cardiovascular Markers Lab Results  Component Value Date   HGB 14.6 11/25/2015   HCT 43.5 11/25/2015                         CA Markers No results found for: CEA, CA125, LABCA2                      Note: Lab results reviewed.  Recent Diagnostic Imaging Results  MR LUMBAR SPINE WO CONTRAST CLINICAL DATA:  Chronic low back pain for 5-6 months. BILATERAL hip pain, worse on the LEFT.  EXAM: MRI LUMBAR SPINE WITHOUT CONTRAST  TECHNIQUE: Multiplanar, multisequence MR imaging of the lumbar spine was performed. No intravenous contrast was administered.  COMPARISON:  MRI lumbar spine 08/31/2014.  FINDINGS: Segmentation:  Standard.  Alignment:  Physiologic.  Vertebrae: No fracture, evidence of discitis, or bone lesion. Minor anterior endplate edema J5-7, unchanged.  Conus medullaris and cauda equina: Conus extends to the L1 level. Conus and cauda equina appear normal.  Paraspinal and other soft tissues: Unremarkable. Renal cystic disease incompletely evaluated.  Disc levels:  L1-L2:  Normal.  L2-L3:  Normal.  L3-L4:  Normal.  L4-L5: Minor bulge. Good preservation of disc height. Mild facet arthropathy. No impingement.  L5-S1:  Disc space narrowing.  Annular bulge.  No impingement.  Compared with priors, similar appearance.  IMPRESSION: Minor lumbar spondylosis. No spinal stenosis, frank disc protrusion, or compressive lesion.  Electronically Signed   By: Staci Righter M.D.   On: 07/12/2018 13:18  Complexity Note: Imaging results reviewed. Results shared with Mr. Vassar, using Layman's  terms.                         Meds   Current Outpatient Medications:  .  aspirin 81 MG tablet, Take 81 mg by mouth daily., Disp: , Rfl:  .  gabapentin (NEURONTIN) 100 MG capsule, Take 1 capsule (100 mg total) by mouth 4 (four) times daily. 1-3 capsules 4 times per day, Disp: 120 capsule, Rfl: 2 .  [START ON 11/25/2018] oxyCODONE (OXY IR/ROXICODONE) 5 MG immediate release tablet, Take 1 tablet (5 mg total) by mouth every 8 (eight) hours as needed for severe pain., Disp: 90 tablet, Rfl: 0 .  pantoprazole (PROTONIX) 40 MG tablet, Take 40 mg by mouth daily., Disp: , Rfl:  .  simvastatin (ZOCOR) 10 MG tablet, Take 10 mg by mouth at bedtime. , Disp: , Rfl:  .  Cholecalciferol 5000 units capsule, Take 1 capsule (5,000 Units total) by mouth daily., Disp: 90 capsule, Rfl: 0 .  [START ON 10/26/2018] oxyCODONE (OXY IR/ROXICODONE) 5 MG immediate release tablet, Take 1 tablet (5 mg total) by mouth every 8 (eight) hours as needed for severe pain., Disp: 90 tablet, Rfl: 0 .  [START ON 09/26/2018] oxyCODONE (OXY IR/ROXICODONE) 5 MG immediate release tablet, Take 1 tablet (5 mg total) by mouth every 8 (eight) hours as needed for severe pain., Disp: 90  tablet, Rfl: 0  ROS  Constitutional: Denies any fever or chills Gastrointestinal: No reported hemesis, hematochezia, vomiting, or acute GI distress Musculoskeletal: Denies any acute onset joint swelling, redness, loss of ROM, or weakness Neurological: No reported episodes of acute onset apraxia, aphasia, dysarthria, agnosia, amnesia, paralysis, loss of coordination, or loss of consciousness  Allergies  Mr. Barriere has No Known Allergies.  PFSH  Drug: Mr. Skoczylas  reports no history of drug use. Alcohol:  reports no history of alcohol use. Tobacco:  reports that he has never smoked. He has never used smokeless tobacco. Medical:  has a past medical history of Acute postoperative pain (03/21/2017), Bilateral arm pain, Cardiomyopathy ([place]), Diverticulosis,  Dupuytren's contracture, Erosive gastritis, Foot pain, bilateral, GERD (gastroesophageal reflux disease), Hand weakness, Hyperlipidemia, Knee pain, Midline low back pain, Osteoarthritis, Pain in neck, Prostate cancer ([place]), Reactive airway disease, Seasonal allergies, Situational anxiety, and Sleep apnea. Surgical: Mr. Frazzini  has a past surgical history that includes Nose surgery; Hand surgery (Bilateral); Colonoscopy (10/24/2012); Upper gi endoscopy (12/18/2012); Tonsillectomy (2009); Skin cancer excision (Left, 2015); Mass excision (Right, 07/18/2015); Esophagogastroduodenoscopy (egd) with propofol (N/A, 10/28/2015); prostate seeding; Hernia repair; Cholecystectomy (N/A, 12/02/2015); Esophagogastroduodenoscopy (N/A, 11/17/2017); and Colonoscopy with propofol (N/A, 11/17/2017). Family: family history includes Colon cancer in his brother; Hyperlipidemia in his sister; Lung cancer in his father; Prostate cancer in his brother.  Constitutional Exam  General appearance: Well nourished, well developed, and well hydrated. In no apparent acute distress Vitals:   09/20/18 1001  BP: (!) 122/92  Pulse: 79  Resp: 16  Temp: 98.8 F (37.1 C)  TempSrc: Oral  SpO2: 99%  Weight: 187 lb (84.8 kg)  Height: _0  (1.753 m)   BMI Assessment: Estimated body mass index is 27.62 kg/m as calculated from the following:   Height as of this encounter: _1  (1.753 m).   Weight as of this encounter: 187 lb (84.8 kg).  Psych/Mental status: Alert, oriented x 3 (person, place, & time)       Eyes: PERLA Respiratory: No evidence of acute respiratory distress  Lumbar Spine Area Exam  Skin & Axial Inspection: No masses, redness, or swelling Alignment: Symmetrical Functional ROM: Unrestricted ROM       Stability: No instability detected Muscle Tone/Strength: Functionally intact. No obvious neuro-muscular anomalies detected. Sensory (Neurological): Unimpaired Palpation: No palpable anomalies        Gait & Posture  Assessment  Ambulation: Unassisted Gait: Relatively normal for age and body habitus Posture: WNL   Lower Extremity Exam    Side: Right lower extremity  Side: Left lower extremity  Stability: No instability observed          Stability: No instability observed          Skin & Extremity Inspection: Skin color, temperature, and hair growth are WNL. No peripheral edema or cyanosis. No masses, redness, swelling, asymmetry, or associated skin lesions. No contractures.  Skin & Extremity Inspection: Skin color, temperature, and hair growth are WNL. No peripheral edema or cyanosis. No masses, redness, swelling, asymmetry, or associated skin lesions. No contractures.  Functional ROM: Unrestricted ROM                  Functional ROM: Unrestricted ROM                  Muscle Tone/Strength: Functionally intact. No obvious neuro-muscular anomalies detected.  Muscle Tone/Strength: Functionally intact. No obvious neuro-muscular anomalies detected.  Sensory (Neurological): Unimpaired  Sensory (Neurological): Unimpaired         Assessment  Primary Diagnosis & Pertinent Problem List: The primary encounter diagnosis was Chronic lumbar radicular pain (Bilateral) (L>R) (Bilateral S1 dermatomal distribution). Diagnoses of Cervical spondylosis, Chronic knee pain (Right), and Chronic pain syndrome were also pertinent to this visit.  Status Diagnosis  Controlled Controlled Controlled 1. Chronic lumbar radicular pain (Bilateral) (L>R) (Bilateral S1 dermatomal distribution)   2. Cervical spondylosis   3. Chronic knee pain (Right)   4. Chronic pain syndrome     Problems updated and reviewed during this visit: Problem  Bilateral Leg and Foot Pain   Plan of Care  Pharmacotherapy (Medications Ordered): Meds ordered this encounter  Medications  . oxyCODONE (OXY IR/ROXICODONE) 5 MG immediate release tablet    Sig: Take 1 tablet (5 mg total) by mouth every 8 (eight) hours as needed for severe pain.     Dispense:  90 tablet    Refill:  0    Do not add this medication to the electronic "Automatic Refill" notification system. Patient may have prescription filled one day early if pharmacy is closed on scheduled refill date.    Order Specific Question:   Supervising Provider    Answer:   Milinda Pointer [phone]  . oxyCODONE (OXY IR/ROXICODONE) 5 MG immediate release tablet    Sig: Take 1 tablet (5 mg total) by mouth every 8 (eight) hours as needed for severe pain.    Dispense:  90 tablet    Refill:  0    Do not add this medication to the electronic "Automatic Refill" notification system. Patient may have prescription filled one day early if pharmacy is closed on scheduled refill date.    Order Specific Question:   Supervising Provider    Answer:   Milinda Pointer [phone]  . oxyCODONE (OXY IR/ROXICODONE) 5 MG immediate release tablet    Sig: Take 1 tablet (5 mg total) by mouth every 8 (eight) hours as needed for severe pain.    Dispense:  90 tablet    Refill:  0    Do not add this medication to the electronic "Automatic Refill" notification system. Patient may have prescription filled one day early if pharmacy is closed on scheduled refill date.    Order Specific Question:   Supervising Provider    Answer:   Milinda Pointer [phone]  . gabapentin (NEURONTIN) 100 MG capsule    Sig: Take 1 capsule (100 mg total) by mouth 4 (four) times daily. 1-3 capsules 4 times per day    Dispense:  120 capsule    Refill:  2    Order Specific Question:   Supervising Provider    AnswerMilinda Pointer [phone]   New Prescriptions   No medications on file   Medications administered today: Delane Ginger had no medications administered during this visit. Lab-work, procedure(s), and/or referral(s): No orders of the defined types were placed in this encounter.  Imaging and/or referral(s): None Interventional management options: Planned, scheduled, and/or pending: Not at this time.    Considering: Palliative Left cervical epidural steroid injection  Possible bilateral sacroiliac joint + lumbar facet RFA Diagnostic Bilateral HipBlock Diagnostic bilateral femoral nerve and obturator nerve block Possible bilateral femoral nerve and obturator nerve RFA Diagnostic bilateral cervical facet block. Possible bilateral cervical facet RFA. Diagnostic Caudal ESI + diagnostic epidurogram Diagnostic Right Knee injection. Possible series of 5 right-sided intra-articular [place] injections Diagnostic right Genicular NB Possible right Genicular Nerve RFA. Diagnostic bilateral intra-articular shoulderinjection  Diagnostic bilateral suprascapular NB. Possible bilateral suprascapular Nerve RFA.   Palliative PRN treatment(s): Diagnostic Bilateral Lumbar Facet & Bilateral S-I joint block Diagnostic Bilateral Hip Block Diagnostic bilateral cervical facetblock. Diagnostic Caudal ESI. Diagnostic Left CESI. Diagnostic Right Knee injection. Diagnostic right Genicular NB Diagnostic bilateral intra-articular shoulderinj. Diagnostic bilateral suprascapular NB.    Provider-requested follow-up: Return in about 3 months (around 12/20/2018) for MedMgmt.  Future Appointments  Date Time Provider [place]  12/18/2018  9:45 AM Vevelyn Francois, NP [facility] None   Primary Care Physician: Sofie Hartigan, MD Location: [facility] Note by: Vevelyn Francois NP Date: 09/20/2018; Time: 3:27 PM  Pain Score Disclaimer: We use the NRS-11 scale. This is a self-reported, subjective measurement of pain severity with only modest accuracy. It is used primarily to identify changes within a particular patient. It must be understood that outpatient pain scales are significantly less accurate that those used for research, where they can be applied under ideal controlled circumstances with minimal exposure to variables. In reality, the score is likely to be a  combination of pain intensity and pain affect, where pain affect describes the degree of emotional arousal or changes in action readiness caused by the sensory experience of pain. Factors such as social and work situation, setting, emotional state, anxiety levels, expectation, and prior pain experience may influence pain perception and show large inter-individual differences that may also be affected by time variables.  Patient instructions provided during this appointment: Patient Instructions  ____________________________________________________________________________________________  Medication Rules  Purpose: To inform patients, and their family members, of our rules and regulations.  Applies to: All patients receiving prescriptions (written or electronic).  Pharmacy of record: Pharmacy where electronic prescriptions will be sent. If written prescriptions are taken to a different pharmacy, please inform the nursing staff. The pharmacy listed in the electronic medical record should be the one where you would like electronic prescriptions to be sent.  Electronic prescriptions: In compliance with the [place] (STOP) Act of 2017 (Session Lanny Cramp [phone]), effective September 13, 2018, all controlled substances must be electronically prescribed. Calling prescriptions to the pharmacy will cease to exist.  Prescription refills: Only during scheduled appointments. Applies to all prescriptions.  NOTE: The following applies primarily to controlled substances (Opioid* Pain Medications).   Patient's responsibilities: 1. Pain Pills: Bring all pain pills to every appointment (except for procedure appointments). 2. Pill Bottles: Bring pills in original pharmacy bottle. Always bring the newest bottle. Bring bottle, even if empty. 3. Medication refills: You are responsible for knowing and keeping track of what medications you take and those you need refilled. The  day before your appointment: write a list of all prescriptions that need to be refilled. The day of the appointment: give the list to the admitting nurse. Prescriptions will be written only during appointments. If you forget a medication: it will not be "Called in", "Faxed", or "electronically sent". You will need to get another appointment to get these prescribed. No early refills. Do not call asking to have your prescription filled early. 4. Prescription Accuracy: You are responsible for carefully inspecting your prescriptions before leaving our office. Have the discharge nurse carefully go over each prescription with you, before taking them home. Make sure that your name is accurately spelled, that your address is correct. Check the name and dose of your medication to make sure it is accurate. Check the number of pills, and the written instructions to make sure they are  clear and accurate. Make sure that you are given enough medication to last until your next medication refill appointment. 5. Taking Medication: Take medication as prescribed. When it comes to controlled substances, taking less pills or less frequently than prescribed is permitted and encouraged. Never take more pills than instructed. Never take medication more frequently than prescribed.  6. Inform other Doctors: Always inform, all of your healthcare providers, of all the medications you take. 7. Pain Medication from other Providers: You are not allowed to accept any additional pain medication from any other Doctor or Healthcare provider. There are two exceptions to this rule. (see below) In the event that you require additional pain medication, you are responsible for notifying us, as stated below. 8. Medication Agreement: You are responsible for carefully reading and following our Medication Agreement. This must be signed before receiving any prescriptions from our practice. Safely store a copy of your signed Agreement. Violations to  the Agreement will result in no further prescriptions. (Additional copies of our Medication Agreement are available upon request.) 9. Laws, Rules, & Regulations: All patients are expected to follow all Federal and [employer], [employer], Rules, [employer]. Ignorance of the Laws does not constitute a valid excuse. The use of any illegal substances is prohibited. 10. Adopted CDC guidelines & recommendations: Target dosing levels will be at or below 60 MME/day. Use of benzodiazepines** is not recommended.  Exceptions: There are only two exceptions to the rule of not receiving pain medications from other Healthcare Providers. 1. Exception #1 (Emergencies): In the event of an emergency (i.e.: accident requiring emergency care), you are allowed to receive additional pain medication. However, you are responsible for: As soon as you are able, call our office [phone], at any time of the day or night, and leave a message stating your name, the date and nature of the emergency, and the name and dose of the medication prescribed. In the event that your call is answered by a member of our staff, make sure to document and save the date, time, and the name of the person that took your information.  2. Exception #2 (Planned Surgery): In the event that you are scheduled by another doctor or dentist to have any type of surgery or procedure, you are allowed (for a period no longer than 30 days), to receive additional pain medication, for the acute post-op pain. However, in this case, you are responsible for picking up a copy of our "Post-op Pain Management for Surgeons" handout, and giving it to your surgeon or dentist. This document is available at our office, and does not require an appointment to obtain it. Simply go to our office during business hours (Monday-Thursday from 8:00 AM to 4:00 PM) (Friday 8:00 AM to 12:00 Noon) or if you have a scheduled appointment with [place], prior to your surgery, and ask for it by name.  In addition, you will need to provide [place] with your name, name of your surgeon, type of surgery, and date of procedure or surgery.  *Opioid medications include: morphine, codeine, oxycodone, oxymorphone, hydrocodone, hydromorphone, meperidine, tramadol, tapentadol, buprenorphine, fentanyl, methadone. **Benzodiazepine medications include: diazepam (Valium), alprazolam (Xanax), clonazepam (Klonopine), lorazepam (Ativan), clorazepate (Tranxene), chlordiazepoxide (Librium), estazolam (Prosom), oxazepam (Serax), temazepam (Restoril), triazolam (Halcion) (Last updated: 11/10/2017) ____________________________________________________________________________________________   Oxycodone 5 mg x 3 months escribed to your pharmacy fill dates are:  09/26/18, 10/26/18, 11/25/18  Gabapentin 100 mg bid escribed to pharmacy

## 2018-09-23 ENCOUNTER — Other Ambulatory Visit: Payer: Self-pay | Admitting: Nurse Practitioner

## 2018-11-06 ENCOUNTER — Telehealth: Payer: Self-pay | Admitting: Pain Medicine

## 2018-11-06 NOTE — Telephone Encounter (Signed)
Attempted to call patient to let him know we need info from his insurance card. Message left.

## 2018-11-06 NOTE — Telephone Encounter (Signed)
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 He went to fill meds and they were only going to give him 7 day script so he pain for script. Pharmacy told him he needs PA to get scripts. I informed patient he needs [facility] to be processed.

## 2018-11-06 NOTE — Telephone Encounter (Signed)
We need information from insurance card before we can do the PA. I attempted to call patient, message left.

## 2018-11-06 NOTE — Telephone Encounter (Signed)
[facility] that [facility] not [facility] and states that we have to call this number for the PA [phone]

## 2018-11-07 ENCOUNTER — Telehealth: Payer: Self-pay | Admitting: *Deleted

## 2018-11-07 NOTE — Telephone Encounter (Signed)
Insurance info obtained from patient, PA for Oxycodone submitted.

## 2018-12-18 ENCOUNTER — Other Ambulatory Visit: Payer: Self-pay

## 2018-12-18 ENCOUNTER — Ambulatory Visit: Payer: Medicare Other | Admitting: Nurse Practitioner

## 2018-12-19 ENCOUNTER — Encounter: Payer: Self-pay | Admitting: Nurse Practitioner

## 2018-12-19 ENCOUNTER — Ambulatory Visit: Payer: Medicare Other | Attending: Nurse Practitioner | Admitting: Nurse Practitioner

## 2018-12-19 ENCOUNTER — Other Ambulatory Visit: Payer: Self-pay

## 2018-12-19 DIAGNOSIS — G8929 Other chronic pain: Secondary | ICD-10-CM

## 2018-12-19 DIAGNOSIS — M25559 Pain in unspecified hip: Secondary | ICD-10-CM | POA: Diagnosis not present

## 2018-12-19 DIAGNOSIS — G894 Chronic pain syndrome: Secondary | ICD-10-CM | POA: Diagnosis not present

## 2018-12-19 DIAGNOSIS — M47816 Spondylosis without myelopathy or radiculopathy, lumbar region: Secondary | ICD-10-CM | POA: Diagnosis not present

## 2018-12-19 DIAGNOSIS — M79604 Pain in right leg: Secondary | ICD-10-CM

## 2018-12-19 DIAGNOSIS — M25561 Pain in right knee: Secondary | ICD-10-CM

## 2018-12-19 DIAGNOSIS — M79605 Pain in left leg: Secondary | ICD-10-CM

## 2018-12-19 MED ORDER — GABAPENTIN 100 MG PO CAPS: 100.0000 mg | ORAL_CAPSULE | Freq: Four times a day (QID) | ORAL | 2 refills | Status: DC

## 2018-12-19 MED ORDER — OXYCODONE HCL 5 MG PO TABS: 5.0000 mg | ORAL_TABLET | Freq: Three times a day (TID) | ORAL | 0 refills | Status: DC | PRN

## 2018-12-19 MED ORDER — PREDNISONE 20 MG PO TABS: ORAL_TABLET | ORAL | 0 refills | Status: AC

## 2018-12-19 NOTE — Progress Notes (Signed)
Pain Management Encounter Note - Virtual Visit via Telephone Telehealth (real-time audio visits between healthcare provider and patient).  Patient's Phone No. & Preferred Pharmacy:  [phone] (home); [phone] (mobile); (Preferred) [phone]  [id number] [address], [place] [address] Phone: [phone] Fax: [phone]   Pre-screening note:  Our staff contacted Mr. Gillie and offered him an "in person", "face-to-face" appointment versus a telephone encounter. He indicated preferring the telephone encounter, at this time.  Reason for Virtual Visit: COVID-19*  Social distancing based on CDC and AMA recommendations.   I contacted Delane Ginger on 12/19/2018 at 9:01 AM by telephone and clearly identified myself as Dionisio David, NP. I verified that I was speaking with the correct person using two identifiers (Name and date of birth: 01/20/1951).  Advanced Informed Consent I sought verbal advanced consent from Delane Ginger for telemedicine interactions and virtual visit. I informed Mr. Brabec of the security and privacy concerns, risks, and limitations associated with performing an evaluation and management service by telephone. I also informed Mr. Catena of the availability of "in person" appointments and I informed him of the possibility of a patient responsible charge related to this service. Mr. Shaheen expressed understanding and agreed to proceed.   Historic Elements   Mr. LAVALLE SKODA is a 68 y.o. year old, male patient evaluated today after his last encounter by our practice on 11/07/2018. Mr. Fultz  has a past medical history of Acute postoperative pain (03/21/2017), Bilateral arm pain, Cardiomyopathy ([place]), Diverticulosis, Dupuytren's contracture, Erosive gastritis, Foot pain, bilateral, GERD (gastroesophageal reflux disease), Hand weakness, Hyperlipidemia, Knee pain, Midline low back pain, Osteoarthritis, Pain in neck,  Prostate cancer ([place]), Reactive airway disease, Seasonal allergies, Situational anxiety, and Sleep apnea. He also  has a past surgical history that includes Nose surgery; Hand surgery (Bilateral); Colonoscopy (10/24/2012); Upper gi endoscopy (12/18/2012); Tonsillectomy (2009); Skin cancer excision (Left, 2015); Mass excision (Right, 07/18/2015); Esophagogastroduodenoscopy (egd) with propofol (N/A, 10/28/2015); prostate seeding; Hernia repair; Cholecystectomy (N/A, 12/02/2015); Esophagogastroduodenoscopy (N/A, 11/17/2017); and Colonoscopy with propofol (N/A, 11/17/2017). Mr. Haydon has a current medication list which includes the following prescription(s): aspirin, cholecalciferol, gabapentin, oxycodone, oxycodone, oxycodone, pantoprazole, prednisone, and simvastatin. He  reports that he has never smoked. He has never used smokeless tobacco. He reports that he does not drink alcohol or use drugs. Mr. Craton has No Known Allergies.   HPI  I last saw him on 09/23/2018. He is being evaluated for medication management. He has lower back pain with bilateral leg pain.  He feels like the pain has gotten worse over the last 6 weeks. He admits that standing is worse. However moving makes the pain better. He admits that he is walking about 2 miles a day. He admits that he has taken an extra half of tab. He has been dealing with the pain. He wonders if he pain can be fixed. He admits that he soaks in hot water. He is not taking NASIDs. He did not feel like this was effective. He was seen by his PCP who suggested another medication but he can not remember the name.  Pharmacotherapy Assessment  Analgesic: Oxycodone 5 mg Three times a day MME/day: 22.5 mg/day.   Monitoring: Pharmacotherapy: No side-effects or adverse reactions reported. [place] PMP: PDMP not reviewed this encounter.       Compliance: No problems identified. Plan: Refer to "POC".  Review of recent tests  MR LUMBAR SPINE WO CONTRAST CLINICAL DATA:  Chronic  low  back pain for 5-6 months. BILATERAL hip pain, worse on the LEFT.  EXAM: MRI LUMBAR SPINE WITHOUT CONTRAST  TECHNIQUE: Multiplanar, multisequence MR imaging of the lumbar spine was performed. No intravenous contrast was administered.  COMPARISON:  MRI lumbar spine 08/31/2014.  FINDINGS: Segmentation:  Standard.  Alignment:  Physiologic.  Vertebrae: No fracture, evidence of discitis, or bone lesion. Minor anterior endplate edema J0-0, unchanged.  Conus medullaris and cauda equina: Conus extends to the L1 level. Conus and cauda equina appear normal.  Paraspinal and other soft tissues: Unremarkable. Renal cystic disease incompletely evaluated.  Disc levels:  L1-L2:  Normal.  L2-L3:  Normal.  L3-L4:  Normal.  L4-L5: Minor bulge. Good preservation of disc height. Mild facet arthropathy. No impingement.  L5-S1:  Disc space narrowing.  Annular bulge.  No impingement.  Compared with priors, similar appearance.  IMPRESSION: Minor lumbar spondylosis. No spinal stenosis, frank disc protrusion, or compressive lesion.  Electronically Signed   By: Staci Righter M.D.   On: 07/12/2018 13:18   Office Visit on 04/26/2018  Component Date Value Ref Range Status  . Summary 05/04/2018 FINAL   Final   Comment: ==================================================================== TOXASSURE SELECT 13 (MW) ==================================================================== Test                             Result       Flag       Units Drug Present and Declared for Prescription Verification   Oxycodone                      137          EXPECTED   ng/mg creat   Oxymorphone                    100          EXPECTED   ng/mg creat   Noroxycodone                   140          EXPECTED   ng/mg creat   Noroxymorphone                 43           EXPECTED   ng/mg creat    Sources of oxycodone are scheduled prescription medications.    Oxymorphone, noroxycodone, and noroxymorphone are  expected    metabolites of oxycodone. Oxymorphone is also available as a    scheduled prescription medication. ==================================================================== Test                      Result    Flag   Units      Ref Range   Creatinine              202              mg/dL      >=20 ======                          ============================================================== Declared Medications:  The flagging and interpretation on this report are based on the  following declared medications.  Unexpected results may arise from  inaccuracies in the declared medications.  **Note: The testing scope of this panel includes these medications:  Oxycodone  **Note: The testing scope of this panel does not include following  reported medications:  Aspirin (Aspirin 81)  Cholecalciferol  Pantoprazole (Protonix)  Prednisone (Deltasone)  Simvastatin (Zocor) ==================================================================== For clinical consultation, please call [phone]. ====================================================================   . Glucose 05/04/2018 106* 65 - 99 mg/dL Final  . BUN 05/04/2018 11  8 - 27 mg/dL Final  . Creatinine, Ser 05/04/2018 0.96  0.76 - 1.27 mg/dL Final  . GFR calc non Af Amer 05/04/2018 83  >59 mL/min/1.73 Final  . GFR calc Af Amer 05/04/2018 96  >59 mL/min/1.73 Final  . BUN/Creatinine Ratio 05/04/2018 11  10 - 24 Final  . Sodium 05/04/2018 142  134 - 144 mmol/L Final  . Potassium 05/04/2018 4.3  3.5 - 5.2 mmol/L Final  . Chloride 05/04/2018 103  96 - 106 mmol/L Final  . Calcium 05/04/2018 9.4  8.6 - 10.2 mg/dL Final  . Total Protein 05/04/2018 6.8  6.0 - 8.5 g/dL Final  . Albumin 05/04/2018 4.3  3.6 - 4.8 g/dL Final  . Globulin, Total 05/04/2018 2.5  1.5 - 4.5 g/dL Final  . Albumin/Globulin Ratio 05/04/2018 1.7  1.2 - 2.2 Final  . Bilirubin Total 05/04/2018 0.5  0.0 - 1.2 mg/dL Final  . Alkaline Phosphatase 05/04/2018 60  39 -  117 IU/L Final  . AST 05/04/2018 19  0 - 40 IU/L Final  . Magnesium 05/04/2018 1.9  1.6 - 2.3 mg/dL Final  . Vitamin B-12 05/04/2018 395  232 - 1,245 pg/mL Final  . Sed Rate 05/04/2018 5  0 - 30 mm/hr Final  . 25-Hydroxy, Vitamin D 05/04/2018 85  ng/mL Final   Comment: Reference Range: All Ages: Target levels 30 - 100   . 25-Hydroxy, Vitamin D-2 05/04/2018 <1.0  ng/mL Final  . 25-Hydroxy, Vitamin D-3 05/04/2018 85  ng/mL Final  . CRP 05/04/2018 <1  0 - 10 mg/L Final   Assessment  The encounter diagnosis was Chronic pain syndrome.  Plan of Care  I have changed Leaman T. Offerdahl's oxyCODONE, oxyCODONE, and oxyCODONE. I am also having him start on predniSONE. Additionally, I am having him maintain his aspirin, pantoprazole, simvastatin, Cholecalciferol, and gabapentin.  Pharmacotherapy (Medications Ordered): Meds ordered this encounter  Medications  . gabapentin (NEURONTIN) 100 MG capsule    Sig: Take 1 capsule (100 mg total) by mouth 4 (four) times daily. 1-3 capsules 4 times per day    Dispense:  120 capsule    Refill:  2    Order Specific Question:   Supervising Provider    AnswerMilinda Pointer [phone]  . oxyCODONE (OXY IR/ROXICODONE) 5 MG immediate release tablet    Sig: Take 1 tablet (5 mg total) by mouth every 8 (eight) hours as needed for up to 30 days for severe pain.    Dispense:  90 tablet    Refill:  0    Do not add this medication to the electronic "Automatic Refill" notification system. Patient may have prescription filled one day early if pharmacy is closed on scheduled refill date.    Order Specific Question:   Supervising Provider    Answer:   Milinda Pointer [phone]  . oxyCODONE (OXY IR/ROXICODONE) 5 MG immediate release tablet    Sig: Take 1 tablet (5 mg total) by mouth every 8 (eight) hours as needed for up to 30 days for severe pain.    Dispense:  90 tablet    Refill:  0    Do not add this medication to the electronic "Automatic Refill" notification  system. Patient may have prescription filled one day  early if pharmacy is closed on scheduled refill date.    Order Specific Question:   Supervising Provider    Answer:   Milinda Pointer [phone]  . oxyCODONE (OXY IR/ROXICODONE) 5 MG immediate release tablet    Sig: Take 1 tablet (5 mg total) by mouth every 8 (eight) hours as needed for up to 30 days for severe pain.    Dispense:  90 tablet    Refill:  0    Do not add this medication to the electronic "Automatic Refill" notification system. Patient may have prescription filled one day early if pharmacy is closed on scheduled refill date.    Order Specific Question:   Supervising Provider    Answer:   Milinda Pointer [phone]  . predniSONE (DELTASONE) 20 MG tablet    Sig: Take 3 tab(s) in the morning x 3 days, then 2 tab(s) x 3 days, followed by 1 tab x 3 days.    Dispense:  21 tablet    Refill:  0    Do not add to the "Automatic Refill" notification system.    Order Specific Question:   Supervising Provider    Answer:   Milinda Pointer [phone]   Orders:  No orders of the defined types were placed in this encounter.  Follow-up plan:   No follow-ups on file.   I discussed the assessment and treatment plan with the patient. The patient was provided an opportunity to ask questions and all were answered. The patient agreed with the plan and demonstrated an understanding of the instructions.  Patient advised to call back or seek an in-person evaluation if the symptoms or condition worsens.  Total duration of non-face-to-face encounter: 13 minutes.  Note by: Dionisio David, NP Date: 12/19/2018; Time: 11:54 AM  Disclaimer:  * Given the special circumstances of the COVID-19 pandemic, the federal government has announced that the Office for Civil Rights (OCR) will exercise its enforcement discretion and will not impose penalties on physicians using telehealth in the event of noncompliance with regulatory requirements under the [place] (HIPAA) in connection with the good faith provision of telehealth during the OEHOZ-22 national public health emergency. ([place])

## 2019-01-22 ENCOUNTER — Other Ambulatory Visit: Payer: Self-pay | Admitting: Nurse Practitioner

## 2019-03-15 ENCOUNTER — Encounter: Payer: Self-pay | Admitting: Pain Medicine

## 2019-03-18 NOTE — Progress Notes (Signed)
Pain Management Virtual Encounter Note - Virtual Visit via Telephone Telehealth (real-time audio visits between healthcare provider and patient).   Patient's Phone No. & Preferred Pharmacy:  [phone] (home); [phone] (mobile); (Preferred) [phone] No e-mail address on record  [id number] - [place], [place] [address] Phone: [phone] Fax: [phone]    Pre-screening note:  Our staff contacted Ricky Mercer and offered him an "in person", "face-to-face" appointment versus a telephone encounter. He indicated preferring the telephone encounter, at this time.   Reason for Virtual Visit: COVID-19*  Social distancing based on CDC and AMA recommendations.   I contacted Ricky Mercer on 03/19/2019 via telephone.      I clearly identified myself as Gaspar Cola, MD. I verified that I was speaking with the correct person using two identifiers (Name: Ricky Mercer, and date of birth: Feb 11, 1951).  Advanced Informed Consent I sought verbal advanced consent from Ricky Mercer for virtual visit interactions. I informed Ricky Mercer of possible security and privacy concerns, risks, and limitations associated with providing "not-in-person" medical evaluation and management services. I also informed Ricky Mercer of the availability of "in-person" appointments. Finally, I informed him that there would be a charge for the virtual visit and that he could be  personally, fully or partially, financially responsible for it. Ricky Mercer expressed understanding and agreed to proceed.   Historic Elements   Ricky Mercer is a 68 y.o. year old, male patient evaluated today after his last encounter by our practice on 01/22/2019. Ricky Mercer  has a past medical history of Acute postoperative pain (03/21/2017), Bilateral arm pain, Cardiomyopathy ([place]), Diverticulosis, Dupuytren's contracture, Erosive gastritis, Foot pain, bilateral, GERD  (gastroesophageal reflux disease), Hand weakness, Hyperlipidemia, Knee pain, Midline low back pain, Osteoarthritis, Pain in neck, Prostate cancer ([place]), Reactive airway disease, Seasonal allergies, Situational anxiety, and Sleep apnea. He also  has a past surgical history that includes Nose surgery; Hand surgery (Bilateral); Colonoscopy (10/24/2012); Upper gi endoscopy (12/18/2012); Tonsillectomy (2009); Skin cancer excision (Left, 2015); Mass excision (Right, 07/18/2015); Esophagogastroduodenoscopy (egd) with propofol (N/A, 10/28/2015); prostate seeding; Hernia repair; Cholecystectomy (N/A, 12/02/2015); Esophagogastroduodenoscopy (N/A, 11/17/2017); and Colonoscopy with propofol (N/A, 11/17/2017). Ricky Mercer has a current medication list which includes the following prescription(s): aspirin, gabapentin, oxycodone, oxycodone, oxycodone, pantoprazole, simvastatin, cholecalciferol, and cyclobenzaprine. He  reports that he has never smoked. He has never used smokeless tobacco. He reports that he does not drink alcohol or use drugs. Ricky Mercer has No Known Allergies.   HPI  Today, he is being contacted for medication management.  The patient indicates that the oxycodone is not lasting 8 hours.  He admits that this is possibly due to the fact that he has been taking the medication for many years.  Today we talked about "Drug Holidays" and how to do those.  In addition, the patient indicates taking gabapentin 300 mg 4 times daily with no significant benefits.  However, he does complain of some cognitive impairment due to the gabapentin, during the day.  At the same time, he is complaining of waking up in the middle of the night with pain.  In order to help him with this, I have suggested changing the schedule on the gabapentin to taking 400 mg, 1 to 2 tablets p.o. at bedtime, so asked to avoid using the gabapentin during the day.  This should help him with his sleep pattern.  In addition, we have decided to try some  Flexeril  5 mg p.o. at bedtime, for the same reason.  He indicates that most of his pain is in the middle of the lower back and going down to his feet.  I asked him if he had try the Lyrica in the past and he indicated that he has and that it caused him to have some cognitive impairment and that is why he stopped using it.  Today we will go ahead and add that Flexeril to his regimen and change his medications around to see if this helps.  I have also encouraged him to do a"Drug Holidays" to see if this would help with some of his pain.  If it does not, then we will consider other interventional therapies.  Pharmacotherapy Assessment  Analgesic: Oxycodone 5 mg Three times a day MME/day: 22.5 mg/day.   Monitoring: Pharmacotherapy: No side-effects or adverse reactions reported. [place] PMP: PDMP reviewed during this encounter.       Compliance: No problems identified. Effectiveness: Clinically acceptable. Plan: Refer to "POC".  Pertinent Labs   SAFETY SCREENING Profile No results found for: SARSCOV2NAA, COVIDSOURCE, STAPHAUREUS, MRSAPCR, HCVAB, HIV, PREGTESTUR Renal Function Lab Results  Component Value Date   BUN 11 05/04/2018   CREATININE 0.96 05/04/2018   BCR 11 05/04/2018   GFRAA 96 05/04/2018   GFRNONAA 83 05/04/2018   Hepatic Function Lab Results  Component Value Date   AST 19 05/04/2018   ALT 41 11/25/2015   ALBUMIN 4.3 05/04/2018   UDS Summary  Date Value Ref Range Status  05/04/2018 FINAL  Final    Comment:    ==================================================================== TOXASSURE SELECT 13 (MW) ==================================================================== Test                             Result       Flag       Units Drug Present and Declared for Prescription Verification   Oxycodone                      137          EXPECTED   ng/mg creat   Oxymorphone                    100          EXPECTED   ng/mg creat   Noroxycodone                   140          EXPECTED    ng/mg creat   Noroxymorphone                 43           EXPECTED   ng/mg creat    Sources of oxycodone are scheduled prescription medications.    Oxymorphone, noroxycodone, and noroxymorphone are expected    metabolites of oxycodone. Oxymorphone is also available as a    scheduled prescription medication. ==================================================================== Test                      Result    Flag   Units      Ref Range   Creatinine              202              mg/dL      >=20 ==================================================================== Declared Medications:  The flagging and interpretation on  this report are based on the  following declared medications.  Unexpected results may arise from  inaccuracies in the declared medications.  **Note: The testing scope of this panel includes these medications:  Oxycodone  **Note: The testing scope of this panel does not include following  reported medications:  Aspirin (Aspirin 81)  Cholecalciferol  Pantoprazole (Protonix)  Prednisone (Deltasone)  Simvastatin (Zocor) ==================================================================== For clinical consultation, please call [phone]. ====================================================================    Note: Above Lab results reviewed.  Recent imaging  MR LUMBAR SPINE WO CONTRAST CLINICAL DATA:  Chronic low back pain for 5-6 months. BILATERAL hip pain, worse on the LEFT.  EXAM: MRI LUMBAR SPINE WITHOUT CONTRAST  TECHNIQUE: Multiplanar, multisequence MR imaging of the lumbar spine was performed. No intravenous contrast was administered.  COMPARISON:  MRI lumbar spine 08/31/2014.  FINDINGS: Segmentation:  Standard.  Alignment:  Physiologic.  Vertebrae: No fracture, evidence of discitis, or bone lesion. Minor anterior endplate edema K5-6, unchanged.  Conus medullaris and cauda equina: Conus extends to the L1 level. Conus and cauda equina appear  normal.  Paraspinal and other soft tissues: Unremarkable. Renal cystic disease incompletely evaluated.  Disc levels:  L1-L2:  Normal.  L2-L3:  Normal.  L3-L4:  Normal.  L4-L5: Minor bulge. Good preservation of disc height. Mild facet arthropathy. No impingement.  L5-S1:  Disc space narrowing.  Annular bulge.  No impingement.  Compared with priors, similar appearance.  IMPRESSION: Minor lumbar spondylosis. No spinal stenosis, frank disc protrusion, or compressive lesion.  Electronically Signed   By: Staci Righter M.D.   On: 07/12/2018 13:18  Assessment  The primary encounter diagnosis was Chronic pain syndrome. Diagnoses of Chronic low back pain (Primary Area of Pain) (Bilateral) (L>R), Chronic hip pain (Secondary source of pain) (Bilateral) (L>R), Chronic lower extremity pain (Tertiary source of pain) (Bilateral) (L>R), Neurogenic pain, Pharmacologic therapy, Disorder of skeletal system, Problems influencing health status, and Chronic musculoskeletal pain were also pertinent to this visit.  Plan of Care  I have discontinued Glendell Docker T. Mosco's oxyCODONE and oxyCODONE. I have also changed his gabapentin and oxyCODONE. Additionally, I am having him start on oxyCODONE, oxyCODONE, and cyclobenzaprine. Lastly, I am having him maintain his aspirin, pantoprazole, Cholecalciferol, and simvastatin.  Pharmacotherapy (Medications Ordered): Meds ordered this encounter  Medications  . gabapentin (NEURONTIN) 400 MG capsule    Sig: Take 1-2 capsules (400-800 mg total) by mouth at bedtime. 1-3 capsules 4 times per day    Dispense:  60 capsule    Refill:  2    Fill one day early if pharmacy is closed on scheduled refill date. May substitute for generic if available.  Marland Kitchen oxyCODONE (OXY IR/ROXICODONE) 5 MG immediate release tablet    Sig: Take 1 tablet (5 mg total) by mouth every 8 (eight) hours as needed for severe pain. Must last 30 days    Dispense:  90 tablet    Refill:  0    Chronic  Pain: STOP Act (Not applicable) Fill 1 day early if closed on refill date. Do not fill until: 03/25/2019. To last until: 04/24/2019. Avoid benzodiazepines within 8 hours of opioids  . oxyCODONE (OXY IR/ROXICODONE) 5 MG immediate release tablet    Sig: Take 1 tablet (5 mg total) by mouth every 8 (eight) hours as needed for severe pain. Must last 30 days    Dispense:  90 tablet    Refill:  0    Chronic Pain: STOP Act (Not applicable) Fill 1 day early if closed on refill date.  Do not fill until: 04/24/2019. To last until: 05/24/2019. Avoid benzodiazepines within 8 hours of opioids  . oxyCODONE (OXY IR/ROXICODONE) 5 MG immediate release tablet    Sig: Take 1 tablet (5 mg total) by mouth every 8 (eight) hours as needed for severe pain. Must last 30 days    Dispense:  90 tablet    Refill:  0    Chronic Pain: STOP Act (Not applicable) Fill 1 day early if closed on refill date. Do not fill until: 05/24/2019. To last until: 06/23/2019. Avoid benzodiazepines within 8 hours of opioids  . cyclobenzaprine (FLEXERIL) 5 MG tablet    Sig: Take 1-2 tablets (5-10 mg total) by mouth at bedtime.    Dispense:  60 tablet    Refill:  0    Fill one day early if pharmacy is closed on scheduled refill date. May substitute for generic if available.   Orders:  Orders Placed This Encounter  Procedures  . ToxASSURE Select 13 (MW), Urine    Volume: 30 ml(s). Minimum 3 ml of urine is needed. Document temperature of fresh sample. Indications: Long term (current) use of opiate analgesic (Z79.891)  . Comp. Metabolic Panel (12)    With GFR. Indications: Chronic Pain Syndrome (G89.4) & Pharmacotherapy (W29.937)    Order Specific Question:   Has the patient fasted?    Answer:   No    Order Specific Question:   CC Results    Answer:   PCP-NURSE [[id number]]  . Magnesium    Indication: Pharmacologic therapy (L38.101)    Order Specific Question:   CC Results    Answer:   PCP-NURSE [[id number]]  . Vitamin B12    Indication:  Pharmacologic therapy (E52.778).    Order Specific Question:   CC Results    Answer:   PCP-NURSE [[id number]]  . Sedimentation rate    Indication: Disorder of skeletal system (M89.9)    Order Specific Question:   CC Results    Answer:   PCP-NURSE [[id number]]  . 25-Hydroxyvitamin D Lcms D2+D3    Indication: Disorder of skeletal system (M89.9).    Order Specific Question:   CC Results    Answer:   PCP-NURSE [[id number]]  . C-reactive protein    Indication: Problems influencing health status (Z78.9)    Order Specific Question:   CC Results    Answer:   PCP-NURSE [[id number]]   Follow-up plan:   Return in 3 weeks (on 04/09/2019) for (VV), E/M (MM) for evaluation on Flexeril trial.. ScheduleRightSI + Lumbar Facet RFA.    Considering:   Palliative Left cervical epidural steroid injection  Possible bilateral sacroiliac joint + lumbar facet RFA Diagnostic Bilateral HipBlock Diagnostic bilateral femoral nerve and obturator nerve block Possible bilateral femoral nerve and obturator nerve RFA Diagnostic bilateral cervical facet block. Possible bilateral cervical facet RFA. Diagnostic Caudal ESI + diagnostic epidurogram Diagnostic Right Knee injection. Possible series of 5 right-sided intra-articular [place] injections Diagnostic right Genicular NB Possible right Genicular Nerve RFA. Diagnostic bilateral intra-articular shoulderinjection Diagnostic bilateral suprascapular NB. Possible bilateral suprascapular Nerve RFA.   Palliative PRN treatment(s):   Diagnostic Bilateral Lumbar Facet & Bilateral S-I joint block Diagnostic Bilateral Hip Block Diagnostic bilateral cervical facetblock. Diagnostic Caudal ESI. Diagnostic Left CESI. Diagnostic Right Knee injection. Diagnostic right Genicular NB Diagnostic bilateral intra-articular shoulderinj. Diagnostic bilateral suprascapular NB.    Recent Visits Date Type Provider Dept  12/19/18 Office Visit Vevelyn Francois, NP [facility]   Showing recent visits within past 90 days and meeting all  other requirements   Today's Visits Date Type Provider Dept  03/19/19 Office Visit Milinda Pointer, MD [facility]  Showing today's visits and meeting all other requirements   Future Appointments No visits were found meeting these conditions.  Showing future appointments within next 90 days and meeting all other requirements   I discussed the assessment and treatment plan with the patient. The patient was provided an opportunity to ask questions and all were answered. The patient agreed with the plan and demonstrated an understanding of the instructions.  Patient advised to call back or seek an in-person evaluation if the symptoms or condition worsens.  Total duration of non-face-to-face encounter: 20 minutes.  Note by: Gaspar Cola, MD Date: 03/19/2019; Time: 10:31 AM  Note: This dictation was prepared with Dragon dictation. Any transcriptional errors that may result from this process are unintentional.  Disclaimer:  * Given the special circumstances of the COVID-19 pandemic, the federal government has announced that the Office for Civil Rights (OCR) will exercise its enforcement discretion and will not impose penalties on physicians using telehealth in the event of noncompliance with regulatory requirements under the [place] (HIPAA) in connection with the good faith provision of telehealth during the HQRFX-58 national public health emergency. ([place])

## 2019-03-19 ENCOUNTER — Other Ambulatory Visit: Payer: Self-pay

## 2019-03-19 ENCOUNTER — Ambulatory Visit: Payer: Medicare Other | Attending: Nurse Practitioner | Admitting: Pain Medicine

## 2019-03-19 DIAGNOSIS — G894 Chronic pain syndrome: Secondary | ICD-10-CM | POA: Diagnosis not present

## 2019-03-19 DIAGNOSIS — Z789 Other specified health status: Secondary | ICD-10-CM

## 2019-03-19 DIAGNOSIS — M79604 Pain in right leg: Secondary | ICD-10-CM | POA: Diagnosis not present

## 2019-03-19 DIAGNOSIS — M5441 Lumbago with sciatica, right side: Secondary | ICD-10-CM

## 2019-03-19 DIAGNOSIS — G8929 Other chronic pain: Secondary | ICD-10-CM

## 2019-03-19 DIAGNOSIS — M25559 Pain in unspecified hip: Secondary | ICD-10-CM

## 2019-03-19 DIAGNOSIS — M5442 Lumbago with sciatica, left side: Secondary | ICD-10-CM

## 2019-03-19 DIAGNOSIS — M79605 Pain in left leg: Secondary | ICD-10-CM

## 2019-03-19 DIAGNOSIS — M7918 Myalgia, other site: Secondary | ICD-10-CM

## 2019-03-19 DIAGNOSIS — Z79899 Other long term (current) drug therapy: Secondary | ICD-10-CM

## 2019-03-19 DIAGNOSIS — M899 Disorder of bone, unspecified: Secondary | ICD-10-CM

## 2019-03-19 DIAGNOSIS — M792 Neuralgia and neuritis, unspecified: Secondary | ICD-10-CM

## 2019-03-19 MED ORDER — CYCLOBENZAPRINE HCL 5 MG PO TABS: 5.0000 mg | ORAL_TABLET | Freq: Every day | ORAL | 0 refills | Status: DC

## 2019-03-19 MED ORDER — OXYCODONE HCL 5 MG PO TABS: 5.0000 mg | ORAL_TABLET | Freq: Three times a day (TID) | ORAL | 0 refills | Status: DC | PRN

## 2019-03-19 MED ORDER — GABAPENTIN 400 MG PO CAPS: 400.0000 mg | ORAL_CAPSULE | Freq: Every day | ORAL | 2 refills | Status: DC

## 2019-03-19 NOTE — Patient Instructions (Addendum)
____________________________________________________________________________________________  Drug Holidays (Slow)  What is a "Drug Holiday"? Drug Holiday: is the name given to the period of time during which a patient stops taking a medication(s) for the purpose of eliminating tolerance to the drug.  Benefits . Improved effectiveness of opioids. . Decreased opioid dose needed to achieve benefits. . Improved pain with lesser dose.  What is tolerance? Tolerance: is the progressive decreased in effectiveness of a drug due to its repetitive use. With repetitive use, the body gets use to the medication and as a consequence, it loses its effectiveness. This is a common problem seen with opioid pain medications. As a result, a larger dose of the drug is needed to achieve the same effect that used to be obtained with a smaller dose.  How long should a "Drug Holiday" last? You should stay off of the pain medicine for at least 14 consecutive days. (2 weeks)  Should I stop the medicine "cold [place]"? No. You should always coordinate with your Pain Specialist so that he/she can provide you with the correct medication dose to make the transition as smoothly as possible.  How do I stop the medicine? Slowly. You will be instructed to decrease the daily amount of pills that you take by one (1) pill every seven (7) days. This is called a "slow downward taper" of your dose. For example: if you normally take four (4) pills per day, you will be asked to drop this dose to three (3) pills per day for seven (7) days, then to two (2) pills per day for seven (7) days, then to one (1) per day for seven (7) days, and at the end of those last seven (7) days, this is when the "Drug Holiday" would start.   Will I have withdrawals? By doing a "slow downward taper" like this one, it is unlikely that you will experience any significant withdrawal symptoms. Typically, what triggers withdrawals is the sudden stop of a high  dose opioid therapy. Withdrawals can usually be avoided by slowly decreasing the dose over a prolonged period of time.  What are withdrawals? Withdrawals: refers to the wide range of symptoms that occur after stopping or dramatically reducing opiate drugs after heavy and prolonged use. Withdrawal symptoms do not occur to patients that use low dose opioids, or those who take the medication sporadically. Contrary to benzodiazepine (example: Valium, Xanax, etc.) or alcohol withdrawals ("Delirium Tremens"), opioid withdrawals are not lethal. Withdrawals are the physical manifestation of the body getting rid of the excess receptors.  Expected Symptoms Early symptoms of withdrawal may include: . Agitation . Anxiety . Muscle aches . Increased tearing . Insomnia . Runny nose . Sweating . Yawning  Late symptoms of withdrawal may include: . Abdominal cramping . Diarrhea . Dilated pupils . Goose bumps . Nausea . Vomiting  Will I experience withdrawals? Due to the slow nature of the taper, it is very unlikely that you will experience any.  What is a slow taper? Taper: refers to the gradual decrease in dose.  ___________________________________________________________________________________________    ____________________________________________________________________________________________  Medication Recommendations and Reminders  Applies to: All patients receiving prescriptions (written and/or electronic).  Medication Rules & Regulations: These rules and regulations exist for your safety and that of others. They are not flexible and neither are we. Dismissing or ignoring them will be considered "non-compliance" with medication therapy, resulting in complete and irreversible termination of such therapy. (See document titled "Medication Rules" for more details.) In all conscience, because of safety reasons,  we cannot continue providing a therapy where the patient does not follow  instructions.  Pharmacy of record:   Definition: This is the pharmacy where your electronic prescriptions will be sent.   We do not endorse any particular pharmacy.  You are not restricted in your choice of pharmacy.  The pharmacy listed in the electronic medical record should be the one where you want electronic prescriptions to be sent.  If you choose to change pharmacy, simply notify our nursing staff of your choice of new pharmacy.  Recommendations:  Keep all of your pain medications in a safe place, under lock and key, even if you live alone.   After you fill your prescription, take 1 week's worth of pills and put them away in a safe place. You should keep a separate, properly labeled bottle for this purpose. The remainder should be kept in the original bottle. Use this as your primary supply, until it runs out. Once it's gone, then you know that you have 1 week's worth of medicine, and it is time to come in for a prescription refill. If you do this correctly, it is unlikely that you will ever run out of medicine.  To make sure that the above recommendation works, it is very important that you make sure your medication refill appointments are scheduled at least 1 week before you run out of medicine. To do this in an effective manner, make sure that you do not leave the office without scheduling your next medication management appointment. Always ask the nursing staff to show you in your prescription , when your medication will be running out. Then arrange for the receptionist to get you a return appointment, at least 7 days before you run out of medicine. Do not wait until you have 1 or 2 pills left, to come in. This is very poor planning and does not take into consideration that we may need to cancel appointments due to bad weather, sickness, or emergencies affecting our staff.  "Partial Fill": If for any reason your pharmacy does not have enough pills/tablets to completely fill or refill  your prescription, do not allow for a "partial fill". You will need a separate prescription to fill the remaining amount, which we will not provide. If the reason for the partial fill is your insurance, you will need to talk to the pharmacist about payment alternatives for the remaining tablets, but again, do not accept a partial fill.  Prescription refills and/or changes in medication(s):   Prescription refills, and/or changes in dose or medication, will be conducted only during scheduled medication management appointments. (Applies to both, written and electronic prescriptions.)  No refills on procedure days. No medication will be changed or started on procedure days. No changes, adjustments, and/or refills will be conducted on a procedure day. Doing so will interfere with the diagnostic portion of the procedure.  No phone refills. No medications will be "called into the pharmacy".  No Fax refills.  No weekend refills.  No Holliday refills.  No after hours refills.  Remember:  Business hours are:  Monday to Thursday 8:00 AM to 4:00 PM Provider's Schedule: Dionisio David, NP - Appointments are:  Medication management: Monday to Thursday 8:00 AM to 4:00 PM Milinda Pointer, MD - Appointments are:  Medication management: Monday and Wednesday 8:00 AM to 4:00 PM Procedure day: Tuesday and Thursday 7:30 AM to 4:00 PM Gillis Santa, MD - Appointments are:  Medication management: Tuesday and Thursday 8:00 AM to 4:00 PM Procedure  day: Monday and Wednesday 7:30 AM to 4:00 PM (Last update: 11/10/2017) ____________________________________________________________________________________________

## 2019-03-20 ENCOUNTER — Encounter: Payer: Medicare Other | Admitting: Nurse Practitioner

## 2019-03-28 DIAGNOSIS — F322 Major depressive disorder, single episode, severe without psychotic features: Secondary | ICD-10-CM | POA: Insufficient documentation

## 2019-04-03 ENCOUNTER — Encounter: Payer: Self-pay | Admitting: Pain Medicine

## 2019-04-03 NOTE — Progress Notes (Signed)
Pain Management Virtual Encounter Note - Virtual Visit via Telephone Telehealth (real-time audio visits between healthcare provider and patient).   Patient's Phone No. & Preferred Pharmacy:  [phone] (home); [phone] (mobile); (Preferred) [phone] No e-mail address on record  [id number] - [place], [address] [place] [address] Phone: [phone] Fax: [phone]    Pre-screening note:  Our staff contacted Ricky Mercer and offered him an "in person", "face-to-face" appointment versus a telephone encounter. He indicated preferring the telephone encounter, at this time.   Reason for Virtual Visit: COVID-19*  Social distancing based on CDC and AMA recommendations.   I contacted Ricky Mercer on 04/04/2019 via telephone.      I clearly identified myself as Gaspar Cola, MD. I verified that I was speaking with the correct person using two identifiers (Name: Ricky Mercer, and date of birth: 1950-09-25).  Advanced Informed Consent I sought verbal advanced consent from Ricky Mercer for virtual visit interactions. I informed Ricky Mercer of possible security and privacy concerns, risks, and limitations associated with providing "not-in-person" medical evaluation and management services. I also informed Ricky Mercer of the availability of "in-person" appointments. Finally, I informed him that there would be a charge for the virtual visit and that he could be  personally, fully or partially, financially responsible for it. Ricky Mercer expressed understanding and agreed to proceed.   Historic Elements   Ricky Mercer is a 68 y.o. year old, male patient evaluated today after his last encounter by our practice on 03/19/2019. Ricky Mercer  has a past medical history of Acute postoperative pain (03/21/2017), Bilateral arm pain, Cardiomyopathy ([place]), Diverticulosis, Dupuytren's contracture, Erosive gastritis, Foot pain, bilateral, GERD  (gastroesophageal reflux disease), Hand weakness, Hyperlipidemia, Knee pain, Midline low back pain, Osteoarthritis, Pain in neck, Prostate cancer ([place]), Reactive airway disease, Seasonal allergies, Situational anxiety, and Sleep apnea. He also  has a past surgical history that includes Nose surgery; Hand surgery (Bilateral); Colonoscopy (10/24/2012); Upper gi endoscopy (12/18/2012); Tonsillectomy (2009); Skin cancer excision (Left, 2015); Mass excision (Right, 07/18/2015); Esophagogastroduodenoscopy (egd) with propofol (N/A, 10/28/2015); prostate seeding; Hernia repair; Cholecystectomy (N/A, 12/02/2015); Esophagogastroduodenoscopy (N/A, 11/17/2017); and Colonoscopy with propofol (N/A, 11/17/2017). Ricky Mercer has a current medication list which includes the following prescription(s): aspirin, gabapentin, oxycodone, oxycodone, oxycodone, pantoprazole, simvastatin, cholecalciferol, and pregabalin. He  reports that he has never smoked. He has never used smokeless tobacco. He reports that he does not drink alcohol or use drugs. Ricky Mercer has No Known Allergies.   HPI  Today, he is being contacted for medication management. Encounter is to evaluate Flexeril 5 mg trial.  Unfortunately, he refers that he was unable to tolerate the Flexeril because of fatigue, blurred vision, decreased concentration, and increasing his depression.  At this point, we will stop the Flexeril.  He is pending lab work ordered on last visit.  He indicates taking gabapentin 300 mg 4 times daily with no significant benefits.  He does complain of some cognitive impairment due to the gabapentin, during the day, while also complaining of waking up in the middle of the night with pain.  I have suggested changing the schedule on the gabapentin to taking 400 mg, 1 to 2 tablets p.o. at bedtime, so asked to avoid using the gabapentin during the day. I asked him if he had try the Lyrica in the past and he indicated that he has and that it caused him to have  some cognitive impairment  and that is why he stopped using it.  When I questioned him further about the Lyrica he indicated that he does not quite remember since it was a long time ago.  However, he is interested in going ahead with trying it again and see if by any chance he works better.  My experience is that most people fail this type of medications when they are started at a higher dose than what they can tolerate.  Today we have decided to go ahead and discontinue the gabapentin and start Lyrica 25 mg p.o. at bedtime x2 weeks, followed by Lyrica 25 mg 1 tablet p.o. twice daily x2 weeks, followed by Lyrica 25 mg 1 tablet p.o. 3 times daily for 2 weeks.  I have provided him with a prescription to do this and he should have enough medication to last until 05/15/2019.  I will be calling him at that time to check on this new Lyrica trial.  By all accounts, he should do better with the Lyrica than with the gabapentin since it tends to have a more clean side effect profile.  If by any chance this does not help, he can always go back to his gabapentin 400 mg 1 to 2 tablets p.o. at bedtime.  In addition to this, he refers that he is having a lot of problems with his pain in the area of the lower back.  On his last visit I talked to him about doing a "drug holiday" for the oxycodone.  In any case, he took it upon himself to start that drug holiday and he refers having been off of the oxycodone for several days.  I reminded him that he only has to be off of it for 14 days after which he should have cleared any tolerance to it and he should be able to go back on the medication with significant improvement to its effectiveness due to the elimination of tolerance.  Is looking forward to doing this.  At this time he refers that his worst pain is in the lower back, bilaterally, with the left side being worse than the right.  He refers that this pain goes into his hips, which they feel as if they were killing him.  His hip  pain is bilateral with the left being worse than the right.  The pain is also described to go into both buttocks areas and going down through the back of the legs and what seems to be referred pain from the facets and/or SI joint.  I have reviewed his prior treatments and he had a left-sided lumbar facet and sacroiliac joint radiofrequency ablation done on 03/21/2017.  I reminded him that by now this has probably worn off and this may be the reason why he is having this pain.  In addition to this, he also complains of burning pain and numbness in both of his feet, primarily in the bottom of the foot, which worsens when he walks and at night.  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  Pharmacotherapy Assessment  Analgesic: Oxycodone 5 mg, 1 tab PO q 8 hrs (15 mg/day of oxycodone) MME/day: 22.5 mg/day.   Monitoring: Pharmacotherapy: No side-effects or adverse reactions reported. [place] PMP: PDMP reviewed during this encounter.       Compliance: No problems identified. Effectiveness: Clinically acceptable. Plan: Refer to "POC".  Pertinent Labs   SAFETY SCREENING Profile No results found for: SARSCOV2NAA, COVIDSOURCE, STAPHAUREUS, MRSAPCR, HCVAB, HIV, PREGTESTUR  Renal Function Lab Results  Component Value Date   BUN 11 05/04/2018   CREATININE 0.96 05/04/2018   BCR 11 05/04/2018   GFRAA 96 05/04/2018   GFRNONAA 83 05/04/2018   Hepatic Function Lab Results  Component Value Date   AST 19 05/04/2018   ALT 41 11/25/2015   ALBUMIN 4.3 05/04/2018   UDS Summary  Date Value Ref Range Status  05/04/2018 FINAL  Final    Comment:    ==================================================================== TOXASSURE SELECT 13 (MW) ==================================================================== Test                             Result       Flag       Units Drug Present and Declared for Prescription Verification   Oxycodone                       137          EXPECTED   ng/mg creat   Oxymorphone                    100          EXPECTED   ng/mg creat   Noroxycodone                   140          EXPECTED   ng/mg creat   Noroxymorphone                 43           EXPECTED   ng/mg creat    Sources of oxycodone are scheduled prescription medications.    Oxymorphone, noroxycodone, and noroxymorphone are expected    metabolites of oxycodone. Oxymorphone is also available as a    scheduled prescription medication. ==================================================================== Test                      Result    Flag   Units      Ref Range   Creatinine              202              mg/dL      >=20 ==================================================================== Declared Medications:  The flagging and interpretation on this report are based on the  following declared medications.  Unexpected results may arise from  inaccuracies in the declared medications.  **Note: The testing scope of this panel includes these medications:  Oxycodone  **Note: The testing scope of this panel does not include following  reported medications:  Aspirin (Aspirin 81)  Cholecalciferol  Pantoprazole (Protonix)  Prednisone (Deltasone)  Simvastatin (Zocor) ==================================================================== For clinical consultation, please call [phone]. ====================================================================    Note: Above Lab results reviewed.  Recent imaging  MR LUMBAR SPINE WO CONTRAST CLINICAL DATA:  Chronic low back pain for 5-6 months. BILATERAL hip pain, worse on the LEFT.  EXAM: MRI LUMBAR SPINE WITHOUT CONTRAST  TECHNIQUE: Multiplanar, multisequence MR imaging of the lumbar spine was performed. No intravenous contrast was administered.  COMPARISON:  MRI lumbar spine 08/31/2014.  FINDINGS: Segmentation:  Standard.  Alignment:  Physiologic.  Vertebrae: No fracture, evidence of discitis,  or bone lesion. Minor anterior endplate edema J6-7, unchanged.  Conus medullaris and cauda equina: Conus extends to the L1 level. Conus and cauda equina appear normal.  Paraspinal and other soft tissues: Unremarkable. Renal cystic disease incompletely  evaluated.  Disc levels:  L1-L2:  Normal.  L2-L3:  Normal.  L3-L4:  Normal.  L4-L5: Minor bulge. Good preservation of disc height. Mild facet arthropathy. No impingement.  L5-S1:  Disc space narrowing.  Annular bulge.  No impingement.  Compared with priors, similar appearance.  IMPRESSION: Minor lumbar spondylosis. No spinal stenosis, frank disc protrusion, or compressive lesion.  Electronically Signed   By: Staci Righter M.D.   On: 07/12/2018 13:18  Assessment  The primary encounter diagnosis was Chronic pain syndrome. Diagnoses of Chronic low back pain (Primary Area of Pain) (Bilateral) (L>R), Chronic hip pain (Secondary area of Pain) (Bilateral) (L>R), Chronic lower extremity pain (Third area of Pain) (Bilateral) (L>R), Chronic musculoskeletal pain, Neurogenic pain, Other polyneuropathy, and Polyradiculopathy were also pertinent to this visit.  Plan of Care  I have discontinued Shamar T. Mercer's cyclobenzaprine. I am also having him start on pregabalin. Additionally, I am having him maintain his aspirin, pantoprazole, Cholecalciferol, simvastatin, gabapentin, oxyCODONE, oxyCODONE, and oxyCODONE.  Pharmacotherapy (Medications Ordered): Meds ordered this encounter  Medications  . pregabalin (LYRICA) 25 MG capsule    Sig: Take 1 capsule (25 mg total) by mouth at bedtime for 14 days, THEN 1 capsule (25 mg total) 2 (two) times daily for 14 days, THEN 1 capsule (25 mg total) 3 (three) times daily for 14 days.    Dispense:  84 capsule    Refill:  0    Fill one day early if pharmacy is closed on scheduled refill date. May substitute for generic if available.   Orders:  No orders of the defined types were placed in this  encounter.  Follow-up plan:   Return in about 6 weeks (around 05/14/2019) for (VV), E/M (MM) to evaluate Lyrica trial. RightSI + Lumbar Facet RFA     Considering:   Palliative Left cervical epidural steroid injection  Possible bilateral sacroiliac joint + lumbar facet RFA Diagnostic Bilateral HipBlock Diagnostic bilateral femoral nerve and obturator nerve block Possible bilateral femoral nerve and obturator nerve RFA Diagnostic bilateral cervical facet block. Possible bilateral cervical facet RFA. Diagnostic Caudal ESI + diagnostic epidurogram Diagnostic Right Knee injection. Possible series of 5 right-sided intra-articular [place] injections Diagnostic right Genicular NB Possible right Genicular Nerve RFA. Diagnostic bilateral intra-articular shoulderinjection Diagnostic bilateral suprascapular NB. Possible bilateral suprascapular Nerve RFA.   Palliative PRN treatment(s):   Palliative left-sided lumbar facet RFA (last done on 03/21/2017)  Palliative left-sided sacroiliac joint RFA (last done on 03/21/2017) Diagnostic Bilateral Lumbar Facet & Bilateral S-I joint block Diagnostic Bilateral Hip Block Diagnostic bilateral cervical facetblock. Diagnostic Caudal ESI. Diagnostic Left CESI. Diagnostic Right Knee injection. Diagnostic right Genicular NB Diagnostic bilateral intra-articular shoulderinj. Diagnostic bilateral suprascapular NB.     Recent Visits Date Type Provider Dept  03/19/19 Office Visit Milinda Pointer, MD [facility]  Showing recent visits within past 90 days and meeting all other requirements   Today's Visits Date Type Provider Dept  04/04/19 Office Visit Milinda Pointer, MD [facility]  Showing today's visits and meeting all other requirements   Future Appointments No visits were found meeting these conditions.  Showing future appointments within next 90 days and meeting all other requirements   I discussed the assessment  and treatment plan with the patient. The patient was provided an opportunity to ask questions and all were answered. The patient agreed with the plan and demonstrated an understanding of the instructions.  Patient advised to call back or seek an in-person evaluation if the symptoms or condition  worsens.  Total duration of non-face-to-face encounter: 23 minutes.  Note by: Gaspar Cola, MD Date: 04/04/2019; Time: 11:04 AM  Note: This dictation was prepared with Dragon dictation. Any transcriptional errors that may result from this process are unintentional.  Disclaimer:  * Given the special circumstances of the COVID-19 pandemic, the federal government has announced that the Office for Civil Rights (OCR) will exercise its enforcement discretion and will not impose penalties on physicians using telehealth in the event of noncompliance with regulatory requirements under the [place] (HIPAA) in connection with the good faith provision of telehealth during the DQVHQ-01 national public health emergency. ([place])

## 2019-04-04 ENCOUNTER — Other Ambulatory Visit: Payer: Self-pay

## 2019-04-04 ENCOUNTER — Ambulatory Visit: Payer: Medicare Other | Attending: Pain Medicine | Admitting: Pain Medicine

## 2019-04-04 DIAGNOSIS — M79604 Pain in right leg: Secondary | ICD-10-CM

## 2019-04-04 DIAGNOSIS — M25559 Pain in unspecified hip: Secondary | ICD-10-CM | POA: Diagnosis not present

## 2019-04-04 DIAGNOSIS — G894 Chronic pain syndrome: Secondary | ICD-10-CM

## 2019-04-04 DIAGNOSIS — M792 Neuralgia and neuritis, unspecified: Secondary | ICD-10-CM

## 2019-04-04 DIAGNOSIS — M7918 Myalgia, other site: Secondary | ICD-10-CM

## 2019-04-04 DIAGNOSIS — M5410 Radiculopathy, site unspecified: Secondary | ICD-10-CM

## 2019-04-04 DIAGNOSIS — G8929 Other chronic pain: Secondary | ICD-10-CM

## 2019-04-04 DIAGNOSIS — M5442 Lumbago with sciatica, left side: Secondary | ICD-10-CM

## 2019-04-04 DIAGNOSIS — M79605 Pain in left leg: Secondary | ICD-10-CM

## 2019-04-04 DIAGNOSIS — G6289 Other specified polyneuropathies: Secondary | ICD-10-CM

## 2019-04-04 DIAGNOSIS — M5441 Lumbago with sciatica, right side: Secondary | ICD-10-CM

## 2019-04-04 MED ORDER — PREGABALIN 25 MG PO CAPS: ORAL_CAPSULE | ORAL | 0 refills | Status: DC

## 2019-04-09 ENCOUNTER — Ambulatory Visit: Payer: Medicare Other | Admitting: Pain Medicine

## 2019-04-09 ENCOUNTER — Telehealth: Payer: Self-pay | Admitting: Pain Medicine

## 2019-04-09 NOTE — Telephone Encounter (Signed)
Pt called and stated that lyrica is going to be $200 a month and he states he can't afford that every month. Pt would like to switch back to gabapentin.

## 2019-05-10 ENCOUNTER — Encounter: Payer: Self-pay | Admitting: Pain Medicine

## 2019-05-14 ENCOUNTER — Other Ambulatory Visit: Payer: Self-pay

## 2019-05-14 ENCOUNTER — Ambulatory Visit: Payer: Medicare Other | Attending: Pain Medicine | Admitting: Pain Medicine

## 2019-05-14 DIAGNOSIS — M5441 Lumbago with sciatica, right side: Secondary | ICD-10-CM

## 2019-05-14 DIAGNOSIS — G894 Chronic pain syndrome: Secondary | ICD-10-CM | POA: Diagnosis not present

## 2019-05-14 DIAGNOSIS — Z789 Other specified health status: Secondary | ICD-10-CM

## 2019-05-14 DIAGNOSIS — G8929 Other chronic pain: Secondary | ICD-10-CM

## 2019-05-14 DIAGNOSIS — Z79899 Other long term (current) drug therapy: Secondary | ICD-10-CM

## 2019-05-14 DIAGNOSIS — M899 Disorder of bone, unspecified: Secondary | ICD-10-CM

## 2019-05-14 DIAGNOSIS — M792 Neuralgia and neuritis, unspecified: Secondary | ICD-10-CM

## 2019-05-14 DIAGNOSIS — M5442 Lumbago with sciatica, left side: Secondary | ICD-10-CM | POA: Diagnosis not present

## 2019-05-14 DIAGNOSIS — M79605 Pain in left leg: Secondary | ICD-10-CM

## 2019-05-14 DIAGNOSIS — M25559 Pain in unspecified hip: Secondary | ICD-10-CM | POA: Diagnosis not present

## 2019-05-14 DIAGNOSIS — M79604 Pain in right leg: Secondary | ICD-10-CM

## 2019-05-14 DIAGNOSIS — E559 Vitamin D deficiency, unspecified: Secondary | ICD-10-CM

## 2019-05-14 NOTE — Progress Notes (Signed)
Pain Management Virtual Encounter Note - Virtual Visit via Telephone Telehealth (real-time audio visits between healthcare provider and patient).   Patient's Phone No. & Preferred Pharmacy:  [phone] (home); [phone] (mobile); (Preferred) [phone] No e-mail address on record  [id number] - [place], [address] [place] [address] Phone: [phone] Fax: [phone]    Pre-screening note:  Our staff contacted Mr. Kampe and offered him an "in person", "face-to-face" appointment versus a telephone encounter. He indicated preferring the telephone encounter, at this time.   Reason for Virtual Visit: COVID-19*  Social distancing based on CDC and AMA recommendations.   I contacted Delane Ginger on 05/14/2019 via telephone.      I clearly identified myself as Gaspar Cola, MD. I verified that I was speaking with the correct person using two identifiers (Name: NIRAN CONEY, and date of birth: [birth year]).  Advanced Informed Consent I sought verbal advanced consent from Delane Ginger for virtual visit interactions. I informed Mr. Figaro of possible security and privacy concerns, risks, and limitations associated with providing "not-in-person" medical evaluation and management services. I also informed Mr. Walls of the availability of "in-person" appointments. Finally, I informed him that there would be a charge for the virtual visit and that he could be  personally, fully or partially, financially responsible for it. Mr. Tanksley expressed understanding and agreed to proceed.   Historic Elements   Mr. RAFIQ OILER is a 68 y.o. year old, male patient evaluated today after his last encounter by our practice on 04/09/2019. Mr. Bracci  has a past medical history of Acute postoperative pain (03/21/2017), Bilateral arm pain, Cardiomyopathy ([place]), Diverticulosis, Dupuytren's contracture, Erosive gastritis, Foot pain, bilateral, GERD  (gastroesophageal reflux disease), Hand weakness, Hyperlipidemia, Knee pain, Midline low back pain, Osteoarthritis, Pain in neck, Prostate cancer ([place]), Reactive airway disease, Seasonal allergies, Situational anxiety, and Sleep apnea. He also  has a past surgical history that includes Nose surgery; Hand surgery (Bilateral); Colonoscopy (10/24/2012); Upper gi endoscopy (12/18/2012); Tonsillectomy (2009); Skin cancer excision (Left, 2015); Mass excision (Right, 07/18/2015); Esophagogastroduodenoscopy (egd) with propofol (N/A, 10/28/2015); prostate seeding; Hernia repair; Cholecystectomy (N/A, 12/02/2015); Esophagogastroduodenoscopy (N/A, 11/17/2017); and Colonoscopy with propofol (N/A, 11/17/2017). Mr. Louderback has a current medication list which includes the following prescription(s): aspirin, gabapentin, oxycodone, oxycodone, oxycodone, pantoprazole, simvastatin, venlafaxine xr, cholecalciferol, naproxen, and prednisone. He  reports that he has never smoked. He has never used smokeless tobacco. He reports that he does not drink alcohol or use drugs. Mr. Laver has No Known Allergies.   HPI  Today, he is being contacted for medication management.  Today I had the patient schedule to go over the results of the Lyrica trial.  However, when I checked the PMP it turns out that he never had it filled.  Pharmacotherapy Assessment  Analgesic: Oxycodone 5 mg, 1 tab PO q 8 hrs (15 mg/day of oxycodone) MME/day: 22.5 mg/day.   Monitoring: Pharmacotherapy: No side-effects or adverse reactions reported. [place] PMP: PDMP reviewed during this encounter.       Compliance: No problems identified. Effectiveness: Clinically acceptable. Plan: Refer to "POC".  UDS:  Summary  Date Value Ref Range Status  05/04/2018 FINAL  Final    Comment:    ==================================================================== TOXASSURE SELECT 13 (MW) ==================================================================== Test                              Result  Flag       Units Drug Present and Declared for Prescription Verification   Oxycodone                      137          EXPECTED   ng/mg creat   Oxymorphone                    100          EXPECTED   ng/mg creat   Noroxycodone                   140          EXPECTED   ng/mg creat   Noroxymorphone                 43           EXPECTED   ng/mg creat    Sources of oxycodone are scheduled prescription medications.    Oxymorphone, noroxycodone, and noroxymorphone are expected    metabolites of oxycodone. Oxymorphone is also available as a    scheduled prescription medication. ==================================================================== Test                      Result    Flag   Units      Ref Range   Creatinine              202              mg/dL      >=20 ==================================================================== Declared Medications:  The flagging and interpretation on this report are based on the  following declared medications.  Unexpected results may arise from  inaccuracies in the declared medications.  **Note: The testing scope of this panel includes these medications:  Oxycodone  **Note: The testing scope of this panel does not include following  reported medications:  Aspirin (Aspirin 81)  Cholecalciferol  Pantoprazole (Protonix)  Prednisone (Deltasone)  Simvastatin (Zocor) ==================================================================== For clinical consultation, please call [phone]. ====================================================================    Laboratory Chemistry Profile (12 mo)  Renal: No results found for requested labs within last 8760 hours.  Lab Results  Component Value Date   GFRAA 96 05/04/2018   GFRNONAA 83 05/04/2018   Hepatic: No results found for requested labs within last 8760 hours. Lab Results  Component Value Date   AST 19 05/04/2018   ALT 41 11/25/2015   Other: No results found for  requested labs within last 8760 hours. Note: Above Lab results reviewed.  Imaging  Last 90 days:  No results found.  Assessment  The primary encounter diagnosis was Chronic pain syndrome. Diagnoses of Chronic low back pain (Primary Area of Pain) (Bilateral) (L>R), Chronic hip pain (Secondary area of Pain) (Bilateral) (L>R), Chronic lower extremity pain (Third area of Pain) (Bilateral) (L>R), Pharmacologic therapy, Disorder of skeletal system, Problems influencing health status, Neurogenic pain, and Vitamin D insufficiency were also pertinent to this visit.  Plan of Care  I have discontinued Trea T. Satterly's pregabalin. I have also changed his gabapentin and Cholecalciferol. Additionally, I am having him start on oxyCODONE and oxyCODONE. Lastly, I am having him maintain his aspirin, pantoprazole, simvastatin, naproxen, venlafaxine XR, predniSONE, and oxyCODONE.  Pharmacotherapy (Medications Ordered): Meds ordered this encounter  Medications  . gabapentin (NEURONTIN) 400 MG capsule    Sig: Take 1-2 capsules (400-800 mg total) by mouth at bedtime.    Dispense:  60 capsule  Refill:  2    Fill one day early if pharmacy is closed on scheduled refill date. May substitute for generic if available.  Marland Kitchen oxyCODONE (OXY IR/ROXICODONE) 5 MG immediate release tablet    Sig: Take 1 tablet (5 mg total) by mouth every 8 (eight) hours as needed for severe pain. Must last 30 days    Dispense:  90 tablet    Refill:  0    Chronic Pain: STOP Act (Not applicable) Fill 1 day early if closed on refill date. Do not fill until: 06/23/2019. To last until: 07/23/2019. Avoid benzodiazepines within 8 hours of opioids  . Cholecalciferol 125 MCG (5000 UT) capsule    Sig: Take 1 capsule (5,000 Units total) by mouth daily.    Dispense:  90 capsule    Refill:  0    Fill one day early if pharmacy is closed on scheduled refill date. May substitute for generic if available.  Marland Kitchen oxyCODONE (OXY IR/ROXICODONE) 5 MG immediate  release tablet    Sig: Take 1 tablet (5 mg total) by mouth every 8 (eight) hours as needed for severe pain. Must last 30 days    Dispense:  90 tablet    Refill:  0    Chronic Pain: STOP Act (Not applicable) Fill 1 day early if closed on refill date. Do not fill until: 07/23/2019. To last until: 08/22/2019. Avoid benzodiazepines within 8 hours of opioids  . oxyCODONE (OXY IR/ROXICODONE) 5 MG immediate release tablet    Sig: Take 1 tablet (5 mg total) by mouth every 8 (eight) hours as needed for severe pain. Must last 30 days    Dispense:  90 tablet    Refill:  0    Chronic Pain: STOP Act (Not applicable) Fill 1 day early if closed on refill date. Do not fill until: 08/22/2019. To last until: 09/21/2019. Avoid benzodiazepines within 8 hours of opioids   Orders:  Orders Placed This Encounter  Procedures  . ToxASSURE Select 13 (MW), Urine    Volume: 30 ml(s). Minimum 3 ml of urine is needed. Document temperature of fresh sample. Indications: Long term (current) use of opiate analgesic (Z79.891)  . Comp. Metabolic Panel (12)    With GFR. Indications: Chronic Pain Syndrome (G89.4) & Pharmacotherapy [id number])    Order Specific Question:   Has the patient fasted?    Answer:   No    Order Specific Question:   CC Results    Answer:   PCP-NURSE [id number]  . Magnesium    Indication: Pharmacologic therapy [id number])    Order Specific Question:   CC Results    Answer:   PCP-NURSE [id number]  . Vitamin B12    Indication: Pharmacologic therapy [id number]).    Order Specific Question:   CC Results    Answer:   PCP-NURSE [id number]  . Sedimentation rate    Indication: Disorder of skeletal system (M89.9)    Order Specific Question:   CC Results    Answer:   PCP-NURSE [id number]  . 25-Hydroxyvitamin D Lcms D2+D3    Indication: Disorder of skeletal system (M89.9).    Order Specific Question:   CC Results    Answer:   PCP-NURSE [id number]  . C-reactive protein    Indication: Problems influencing health status  (Z78.9)    Order Specific Question:   CC Results    Answer:   PCP-NURSE [id number]   Follow-up plan:   Return in about 4 months (around 08/29/2019) for (VV), E/M, (MM).  Considering:   Palliative Left cervical epidural steroid injection  Possible bilateral sacroiliac joint + lumbar facet RFA Diagnostic Bilateral HipBlock Diagnostic bilateral femoral nerve and obturator nerve block Possible bilateral femoral nerve and obturator nerve RFA Diagnostic bilateral cervical facet block. Possible bilateral cervical facet RFA. Diagnostic Caudal ESI + diagnostic epidurogram Diagnostic Right Knee injection. Possible series of 5 right-sided intra-articular [place] injections Diagnostic right Genicular NB Possible right Genicular Nerve RFA. Diagnostic bilateral intra-articular shoulderinjection Diagnostic bilateral suprascapular NB. Possible bilateral suprascapular Nerve RFA.   Palliative PRN treatment(s):   Palliative left-sided lumbar facet RFA (last done on 03/21/2017)  Palliative left-sided sacroiliac joint RFA (last done on 03/21/2017) Diagnostic Bilateral Lumbar Facet & Bilateral S-I joint block Diagnostic Bilateral Hip Block Diagnostic bilateral cervical facetblock. Diagnostic Caudal ESI. Diagnostic Left CESI. Diagnostic Right Knee injection. Diagnostic right Genicular NB Diagnostic bilateral intra-articular shoulderinj. Diagnostic bilateral suprascapular NB.      Recent Visits Date Type Provider Dept  05/14/19 Office Visit Milinda Pointer, MD [facility]  04/04/19 Office Visit Milinda Pointer, MD [facility]  03/19/19 Office Visit Milinda Pointer, MD [facility]  Showing recent visits within past 90 days and meeting all other requirements   Future Appointments No visits were found meeting these conditions.  Showing future appointments within next 90 days and meeting all other requirements   I discussed the assessment and  treatment plan with the patient. The patient was provided an opportunity to ask questions and all were answered. The patient agreed with the plan and demonstrated an understanding of the instructions.  Patient advised to call back or seek an in-person evaluation if the symptoms or condition worsens.  Total duration of non-face-to-face encounter: 12 minutes.  Note by: Gaspar Cola, MD Date: 05/14/2019; Time: 12:45 PM  Note: This dictation was prepared with Dragon dictation. Any transcriptional errors that may result from this process are unintentional.  Disclaimer:  * Given the special circumstances of the COVID-19 pandemic, the federal government has announced that the Office for Civil Rights (OCR) will exercise its enforcement discretion and will not impose penalties on physicians using telehealth in the event of noncompliance with regulatory requirements under the [place] (HIPAA) in connection with the good faith provision of telehealth during the XX123456 national public health emergency. ([place])

## 2019-05-15 ENCOUNTER — Telehealth: Payer: Self-pay

## 2019-05-15 ENCOUNTER — Other Ambulatory Visit: Payer: Self-pay

## 2019-05-15 MED ORDER — CHOLECALCIFEROL 125 MCG (5000 UT) PO CAPS: 5000.0000 [IU] | ORAL_CAPSULE | Freq: Every day | ORAL | 0 refills | Status: DC

## 2019-05-15 MED ORDER — GABAPENTIN 400 MG PO CAPS: 400.0000 mg | ORAL_CAPSULE | Freq: Every day | ORAL | 2 refills | Status: DC

## 2019-05-15 MED ORDER — OXYCODONE HCL 5 MG PO TABS: 5.0000 mg | ORAL_TABLET | Freq: Three times a day (TID) | ORAL | 0 refills | Status: DC | PRN

## 2019-05-15 NOTE — Telephone Encounter (Signed)
Patient notified

## 2019-05-15 NOTE — Telephone Encounter (Signed)
He called back saying he needs a refill on gabapentin and he doesn't think Dr. Dossie Arbour called it in.

## 2019-05-15 NOTE — Telephone Encounter (Signed)
Patient was seen on yesterday and Gabapentin was sent in with pain medication and Vitamin D and will be able to fill on 06/23/19

## 2019-05-15 NOTE — Telephone Encounter (Signed)
Need to call and let him know.

## 2019-08-10 LAB — TOXASSURE SELECT 13 (MW), URINE

## 2019-08-12 LAB — VITAMIN B12: Vitamin B-12: 362 pg/mL (ref 232–1245)

## 2019-08-12 LAB — SEDIMENTATION RATE: Sed Rate: 2 mm/hr (ref 0–30)

## 2019-08-12 LAB — 25-HYDROXYVITAMIN D LCMS D2+D3: 25-Hydroxy, Vitamin D-2: <1 ng/mL

## 2019-08-12 LAB — COMP. METABOLIC PANEL (12)
AST: 23 IU/L (ref 0–40)
Albumin/Globulin Ratio: 1.7 (ref 1.2–2.2)
Albumin: 4.3 g/dL (ref 3.8–4.8)
Alkaline Phosphatase: 72 IU/L (ref 39–117)
BUN/Creatinine Ratio: 12 (ref 10–24)
BUN: 11 mg/dL (ref 8–27)
Bilirubin Total: 0.7 mg/dL (ref 0.0–1.2)
Calcium: 9.2 mg/dL (ref 8.6–10.2)
Chloride: 104 mmol/L (ref 96–106)
Creatinine, Ser: 0.93 mg/dL (ref 0.76–1.27)
GFR calc Af Amer: 99 mL/min/{1.73_m2} (ref >59)
GFR calc non Af Amer: 86 mL/min/{1.73_m2} (ref >59)
Globulin, Total: 2.6 g/dL (ref 1.5–4.5)
Glucose: 94 mg/dL (ref 65–99)
Potassium: 4.3 mmol/L (ref 3.5–5.2)
Sodium: 142 mmol/L (ref 134–144)
Total Protein: 6.9 g/dL (ref 6.0–8.5)

## 2019-08-12 LAB — MAGNESIUM: Magnesium: 2 mg/dL (ref 1.6–2.3)

## 2019-08-12 LAB — 25-HYDROXY VITAMIN D LCMS D2+D3
25-Hydroxy, Vitamin D-3: 49 ng/mL
25-Hydroxy, Vitamin D: 49 ng/mL

## 2019-08-12 LAB — C-REACTIVE PROTEIN: CRP: <1 mg/L (ref 0–10)

## 2019-08-28 ENCOUNTER — Encounter: Payer: Self-pay | Admitting: Pain Medicine

## 2019-08-28 NOTE — Progress Notes (Signed)
Disclaimer: In compliance with Federal Rules mandating open notes, implemented by the [facility], these notes are made available to patients through the electronic medical record. Information contained herein reflects the providers review of information obtained during the pre-charting review of records, as well as notes taken during the patients appointment.  Although all data contained herein was reviewed by the provider, unless specifically stated, due to time constrains, not all of it was discussed with the patient during the appointment. Specific medical language and abbreviations used within medical records is meant to communicate precise data to individuals trained to interpret such data.  Warning: These encounter notes are not meant to serve the purpose of providing patients with clear and concise information on their conditions, treatment plan, or specific risks and possible complications. Such information is provided to patients under the encounter's "Patient Information" section. Interpretation of the information contained herein should be left to individuals with the necessary training to understand the data.  Pain Management Virtual Encounter Note - Virtual Visit via Telephone Telehealth (real-time audio visits between healthcare provider and patient).   Patient's Phone No. & Preferred Pharmacy:  [phone] (home); [phone] (mobile); (Preferred) [phone] No e-mail address on record  [id number] - [place], [address] [address] Phone: [phone] Fax: [phone]    Pre-screening note:  Our staff contacted Ricky Mercer and offered him an "in person", "face-to-face" appointment versus a telephone encounter. He indicated preferring the telephone encounter, at this time.   Reason for Virtual Visit: COVID-19*  Social distancing based on CDC and AMA recommendations.   I contacted Ricky Mercer on 08/29/2019 via telephone.       I clearly identified myself as Gaspar Cola, MD. I verified that I was speaking with the correct person using two identifiers (Name: Ricky Mercer, and date of birth: 1950-11-20).  Advanced Informed Consent I sought verbal advanced consent from Ricky Mercer for virtual visit interactions. I informed Ricky Mercer of possible security and privacy concerns, risks, and limitations associated with providing "not-in-person" medical evaluation and management services. I also informed Ricky Mercer of the availability of "in-person" appointments. Finally, I informed him that there would be a charge for the virtual visit and that he could be  personally, fully or partially, financially responsible for it. Ricky Mercer expressed understanding and agreed to proceed.   Historic Elements   Ricky Mercer is a 68 y.o. year old, male patient evaluated today after his last encounter by our practice on 05/15/2019. Ricky Mercer  has a past medical history of Acute postoperative pain (03/21/2017), Bilateral arm pain, Cardiomyopathy ([place]), Diverticulosis, Dupuytren's contracture, Erosive gastritis, Foot pain, bilateral, GERD (gastroesophageal reflux disease), Hand weakness, Hyperlipidemia, Knee pain, Midline low back pain, Osteoarthritis, Pain in neck, Prostate cancer ([place]), Reactive airway disease, Seasonal allergies, Situational anxiety, and Sleep apnea. He also  has a past surgical history that includes Nose surgery; Hand surgery (Bilateral); Colonoscopy (10/24/2012); Upper gi endoscopy (12/18/2012); Tonsillectomy (2009); Skin cancer excision (Left, 2015); Mass excision (Right, 07/18/2015); Esophagogastroduodenoscopy (egd) with propofol (N/A, 10/28/2015); prostate seeding; Hernia repair; Cholecystectomy (N/A, 12/02/2015); Esophagogastroduodenoscopy (N/A, 11/17/2017); and Colonoscopy with propofol (N/A, 11/17/2017). Ricky Mercer has a current medication list which includes the following prescription(s): aspirin, [START  ON 09/21/2019] cholecalciferol, [START ON 08/20/2020] gabapentin, naproxen, [START ON 09/21/2019] oxycodone, [START ON 10/21/2019] oxycodone, [START ON 11/20/2019] oxycodone, pantoprazole, prednisone, simvastatin, and venlafaxine xr. He  reports that he has never smoked. He  has never used smokeless tobacco. He reports that he does not drink alcohol or use drugs. Ricky Mercer has No Known Allergies.   HPI  Today, he is being contacted for medication management.  Today we spent some time going over his medications since he was concerned about the gabapentin causing problems with his vision.  He was apparently told by a pharmacist that the reason why he was having that was because he was in a very high dose of the medication.  I am not sure why they said that since he is only taking 400 to 800 mg at bedtime.  Since the maximum dose of the gabapentin is 3600 mg/day, I fail to see how they can think that 800 mg once a day is too much.  In any case, I did tell the patient that because everybody is different, he should try to go down to the 400 mg at bedtime and see if by any chance that helps his vision problems.  I also told him that if his vision does not improve, then he should go back to the 800 mg and perhaps talk to his primary care physician to see if there may be some other reasons why he may be having that problem.  I initially thought that it was due to the prednisone that he had indicated that he was taking, but when I asked him, he then denied taking the prednisone.  In any case, today I have provided him with enough medication to last for the next 90 days, at which time I will follow up with him.  Pharmacotherapy Assessment  Analgesic: Oxycodone 5 mg, 1 tab PO q 8 hrs (15 mg/day of oxycodone) MME/day: 22.5 mg/day.   Monitoring: Pharmacotherapy: No side-effects or adverse reactions reported. [place] PMP: PDMP reviewed during this encounter.       Compliance: No problems identified. Effectiveness: Clinically  acceptable. Plan: Refer to "POC".  UDS:  Summary  Date Value Ref Range Status  08/06/2019 Note  Final    Comment:    ==================================================================== [facility] (MW) ==================================================================== Test                             Result       Flag       Units Drug Present   Oxycodone                      886                     ng/mg creat   Oxymorphone                    886                     ng/mg creat   Noroxycodone                   1251                    ng/mg creat   Noroxymorphone                 361                     ng/mg creat    Sources of oxycodone are scheduled prescription medications.    Oxymorphone, noroxycodone, and noroxymorphone are expected  metabolites of oxycodone. Oxymorphone is also available as a    scheduled prescription medication. ==================================================================== Test                      Result    Flag   Units      Ref Range   Creatinine              51               mg/dL      >=20 ==================================================================== Declared Medications:  Medication list was not provided. ==================================================================== For clinical consultation, please call [phone]. ====================================================================    Laboratory Chemistry Profile (12 mo)  Renal: 08/06/2019: BUN 11; BUN/Creatinine Ratio 12; Creatinine, Ser 0.93  Lab Results  Component Value Date   GFRAA 99 08/06/2019   GFRNONAA 86 08/06/2019   Hepatic: 08/06/2019: Albumin 4.3 Lab Results  Component Value Date   AST 23 08/06/2019   ALT 41 11/25/2015   Other: 08/06/2019: 25-Hydroxy, Vitamin D 49; 25-Hydroxy, Vitamin D-2 <1.0; 25-Hydroxy, Vitamin D-3 49; CRP <1; Sed Rate 2; Vitamin B-12 362 Note: Above Lab results reviewed.  Imaging  MR LUMBAR SPINE WO CONTRAST CLINICAL  DATA:  Chronic low back pain for 5-6 months. BILATERAL hip pain, worse on the LEFT.  EXAM: MRI LUMBAR SPINE WITHOUT CONTRAST  TECHNIQUE: Multiplanar, multisequence MR imaging of the lumbar spine was performed. No intravenous contrast was administered.  COMPARISON:  MRI lumbar spine 08/31/2014.  FINDINGS: Segmentation:  Standard.  Alignment:  Physiologic.  Vertebrae: No fracture, evidence of discitis, or bone lesion. Minor anterior endplate edema [id number], unchanged.  Conus medullaris and cauda equina: Conus extends to the L1 level. Conus and cauda equina appear normal.  Paraspinal and other soft tissues: Unremarkable. Renal cystic disease incompletely evaluated.  Disc levels:  L1-L2:  Normal.  L2-L3:  Normal.  L3-L4:  Normal.  L4-L5: Minor bulge. Good preservation of disc height. Mild facet arthropathy. No impingement.  L5-S1:  Disc space narrowing.  Annular bulge.  No impingement.  Compared with priors, similar appearance.  IMPRESSION: Minor lumbar spondylosis. No spinal stenosis, frank disc protrusion, or compressive lesion.  Electronically Signed   By: Staci Righter M.D.   On: 07/12/2018 13:18   Assessment  The primary encounter diagnosis was Chronic pain syndrome. Diagnoses of Chronic low back pain (Primary Area of Pain) (Bilateral) (L>R), Chronic hip pain (Secondary area of Pain) (Bilateral) (L>R), Chronic lower extremity pain (Third area of Pain) (Bilateral) (L>R), Neurogenic pain, and Vitamin D insufficiency were also pertinent to this visit.  Plan of Care  Problem-specific:  No problem-specific Assessment & Plan notes found for this encounter.  I am having Ricky Mercer start on oxyCODONE and oxyCODONE. I am also having him maintain his aspirin, pantoprazole, simvastatin, naproxen, venlafaxine XR, predniSONE, oxyCODONE, gabapentin, and Cholecalciferol.  Pharmacotherapy (Medications Ordered): Meds ordered this encounter  Medications  . oxyCODONE  (OXY IR/ROXICODONE) 5 MG immediate release tablet    Sig: Take 1 tablet (5 mg total) by mouth every 8 (eight) hours as needed for severe pain. Must last 30 days    Dispense:  90 tablet    Refill:  0    Chronic Pain: STOP Act (Not applicable) Fill 1 day early if closed on refill date. Do not fill until: 09/21/2019. To last until: 10/21/2019. Avoid benzodiazepines within 8 hours of opioids  . oxyCODONE (OXY IR/ROXICODONE) 5 MG immediate release tablet    Sig: Take 1 tablet (5 mg total) by  mouth every 8 (eight) hours as needed for severe pain. Must last 30 days    Dispense:  90 tablet    Refill:  0    Chronic Pain: STOP Act (Not applicable) Fill 1 day early if closed on refill date. Do not fill until: 10/21/2019. To last until: 11/20/2019. Avoid benzodiazepines within 8 hours of opioids  . oxyCODONE (OXY IR/ROXICODONE) 5 MG immediate release tablet    Sig: Take 1 tablet (5 mg total) by mouth every 8 (eight) hours as needed for severe pain. Must last 30 days    Dispense:  90 tablet    Refill:  0    Chronic Pain: STOP Act (Not applicable) Fill 1 day early if closed on refill date. Do not fill until: 11/20/2019. To last until: 12/20/2019. Avoid benzodiazepines within 8 hours of opioids  . gabapentin (NEURONTIN) 400 MG capsule    Sig: Take 1-2 capsules (400-800 mg total) by mouth at bedtime.    Dispense:  60 capsule    Refill:  5    Fill one day early if pharmacy is closed on scheduled refill date. May substitute for generic if available.  . Cholecalciferol 125 MCG (5000 UT) capsule    Sig: Take 1 capsule (5,000 Units total) by mouth daily.    Dispense:  90 capsule    Refill:  3    Fill one day early if pharmacy is closed on scheduled refill date. May substitute for generic if available.   Orders:  No orders of the defined types were placed in this encounter.  Follow-up plan:   Return in about 16 weeks (around 12/19/2019) for (VV), (MM).      Considering:   Palliative Left cervical epidural steroid  injection  Possible bilateral sacroiliac joint + lumbar facet RFA Diagnostic Bilateral HipBlock Diagnostic bilateral femoral nerve and obturator nerve block Possible bilateral femoral nerve and obturator nerve RFA Diagnostic bilateral cervical facet block. Possible bilateral cervical facet RFA. Diagnostic Caudal ESI + diagnostic epidurogram Diagnostic Right Knee injection. Possible series of 5 right-sided intra-articular [place] injections Diagnostic right Genicular NB Possible right Genicular Nerve RFA. Diagnostic bilateral intra-articular shoulderinjection Diagnostic bilateral suprascapular NB. Possible bilateral suprascapular Nerve RFA.   Palliative PRN treatment(s):   Palliative left-sided lumbar facet RFA (last done on 03/21/2017)  Palliative left-sided sacroiliac joint RFA (last done on 03/21/2017) Diagnostic Bilateral Lumbar Facet & Bilateral S-I joint block Diagnostic Bilateral Hip Block Diagnostic bilateral cervical facetblock. Diagnostic Caudal ESI. Diagnostic Left CESI. Diagnostic Right Knee injection. Diagnostic right Genicular NB Diagnostic bilateral intra-articular shoulderinj. Diagnostic bilateral suprascapular NB.       Recent Visits No visits were found meeting these conditions.  Showing recent visits within past 90 days and meeting all other requirements   Today's Visits Date Type Provider Dept  08/29/19 Telemedicine Milinda Pointer, MD [facility]  Showing today's visits and meeting all other requirements   Future Appointments Date Type Provider Dept  09/10/19 Appointment Milinda Pointer, MD [facility]  Showing future appointments within next 90 days and meeting all other requirements   I discussed the assessment and treatment plan with the patient. The patient was provided an opportunity to ask questions and all were answered. The patient agreed with the plan and demonstrated an understanding of the  instructions.  Patient advised to call back or seek an in-person evaluation if the symptoms or condition worsens.  Total duration of non-face-to-face encounter: 18 minutes.  Note by: Gaspar Cola, MD Date: 08/29/2019; Time: 10:48  AM  Note: This dictation was prepared with Dragon dictation. Any transcriptional errors that may result from this process are unintentional.

## 2019-08-29 ENCOUNTER — Other Ambulatory Visit: Payer: Self-pay

## 2019-08-29 ENCOUNTER — Ambulatory Visit: Payer: Medicare Other | Attending: Pain Medicine | Admitting: Pain Medicine

## 2019-08-29 DIAGNOSIS — M79605 Pain in left leg: Secondary | ICD-10-CM

## 2019-08-29 DIAGNOSIS — G894 Chronic pain syndrome: Secondary | ICD-10-CM

## 2019-08-29 DIAGNOSIS — M792 Neuralgia and neuritis, unspecified: Secondary | ICD-10-CM

## 2019-08-29 DIAGNOSIS — M5442 Lumbago with sciatica, left side: Secondary | ICD-10-CM | POA: Diagnosis not present

## 2019-08-29 DIAGNOSIS — M79604 Pain in right leg: Secondary | ICD-10-CM

## 2019-08-29 DIAGNOSIS — M25559 Pain in unspecified hip: Secondary | ICD-10-CM | POA: Diagnosis not present

## 2019-08-29 DIAGNOSIS — E559 Vitamin D deficiency, unspecified: Secondary | ICD-10-CM

## 2019-08-29 DIAGNOSIS — M5441 Lumbago with sciatica, right side: Secondary | ICD-10-CM

## 2019-08-29 DIAGNOSIS — G8929 Other chronic pain: Secondary | ICD-10-CM

## 2019-08-29 MED ORDER — OXYCODONE HCL 5 MG PO TABS: 5.0000 mg | ORAL_TABLET | Freq: Three times a day (TID) | ORAL | 0 refills | Status: DC | PRN

## 2019-08-29 MED ORDER — CHOLECALCIFEROL 125 MCG (5000 UT) PO CAPS: 5000.0000 [IU] | ORAL_CAPSULE | Freq: Every day | ORAL | 3 refills | Status: DC

## 2019-08-29 MED ORDER — GABAPENTIN 400 MG PO CAPS: 400.0000 mg | ORAL_CAPSULE | Freq: Every day | ORAL | 5 refills | Status: DC

## 2019-09-10 ENCOUNTER — Telehealth: Payer: Medicare Other | Admitting: Pain Medicine

## 2019-11-25 NOTE — Progress Notes (Signed)
Patient: Ricky Mercer  Service Category: E/M  Provider: Gaspar Cola, MD  DOB: April 01, 1951  DOS: 11/26/2019  Location: Office  MRN: [id number]  Setting: Ambulatory outpatient  Referring Provider: Sofie Hartigan, MD  Type: Established Patient  Specialty: Interventional Pain Management  PCP: Sofie Hartigan, MD  Location: Remote location  Delivery: TeleHealth     Virtual Encounter - Pain Management PROVIDER NOTE: Information contained herein reflects review and annotations entered in association with encounter. Interpretation of such information and data should be left to medically-trained personnel. Information provided to patient can be located elsewhere in the medical record under "Patient Instructions". Document created using STT-dictation technology, any transcriptional errors that may result from process are unintentional.    Contact & Pharmacy Preferred: [phone] Home: [phone] (home) Mobile: [phone] (mobile) E-mail: No e-mail address on record  [id number] [place] [id number]: [phone]: [phone]  Pre-screening  Mr. LLambertsonoffered "in-person" vs "virtual" encounter. He indicated preferring virtual for this encounter.   Reason COVID-19*  Social distancing based on CDC and AMA recommendations.   I contacted Ricky Gingeron 11/26/2019 via telephone.      I clearly identified myself as FGaspar Cola MD. I verified that I was speaking with the correct person using two identifiers (Name: Ricky Mercer and date of birth: 103/23/1952.  Consent I sought verbal advanced consent from Ricky Gingerfor virtual visit interactions. I informed Mr. LFrisinaof possible security and privacy concerns, risks, and limitations associated with providing "not-in-person" medical evaluation and management services. I also informed Mr. LNidayof the availability of "in-person" appointments.  Finally, I informed him that there would be a charge for the virtual visit and that he could be  personally, fully or partially, financially responsible for it. Mr. LKloepferexpressed understanding and agreed to proceed.   Historic Elements   Mr. COPIE MACLAUGHLINis a 69y.o. year old, male patient evaluated today after his last contact with our practice on Visit date not found. Ricky Mercer has a past medical history of Acute postoperative pain (03/21/2017), Bilateral arm pain, Cardiomyopathy ([place], Diverticulosis, Dupuytren's contracture, Erosive gastritis, Foot pain, bilateral, GERD (gastroesophageal reflux disease), Hand weakness, Hyperlipidemia, Knee pain, Midline low back pain, Osteoarthritis, Pain in neck, Prostate cancer ([place], Reactive airway disease, Seasonal allergies, Situational anxiety, and Sleep apnea. He also  has a past surgical history that includes Nose surgery; Hand surgery (Bilateral); Colonoscopy (10/24/2012); Upper gi endoscopy (12/18/2012); Tonsillectomy (2009); Skin cancer excision (Left, 2015); Mass excision (Right, 07/18/2015); Esophagogastroduodenoscopy (egd) with propofol (N/A, 10/28/2015); prostate seeding; Hernia repair; Cholecystectomy (N/A, 12/02/2015); Esophagogastroduodenoscopy (N/A, 11/17/2017); and Colonoscopy with propofol (N/A, 11/17/2017). Mr. LSaleebyhas a current medication list which includes the following prescription(s): aspirin, cholecalciferol, [START ON 08/20/2020] gabapentin, naproxen, [START ON 12/20/2019] oxycodone, [START ON 01/19/2020] oxycodone, [START ON 02/18/2020] oxycodone, pantoprazole, simvastatin, and alprazolam. He  reports that he has never smoked. He has never used smokeless tobacco. He reports that he does not drink alcohol or use drugs. [name] No Known Allergies.   HPI  Today, he is being contacted for worsening of previously known (established) problem.  Mr. LFlukeis a very poor historian.  It took a long time to get the following information out  of him.  According to the patient, sometime ago he went out on his truck and apparently had some mechanical problems where he ended up having  to push the vehicle.  After that, then he then began to experience an increase in his pain both in the lower back and neck.  This pain would go all the way down into his feet and hands.  In fact, according to the patient his worst pain is that of his feet, bilaterally, with the left being worse than the right.  According to the patient the distribution of the pain is through the bottom of his foot affecting all of his toes except for the big one.  (S1 dermatomal distribution).  This pattern is the same for both the left and right lower extremities.  His second worst pain is described to be that of the hands, bilaterally, with the left being worse than the right.  According to the patient the pain goes all the way down into the area of the middle finger, but it will affect all fingers with the middle one being the worst.  Is third worst pain appears to be that of his neck, bilaterally, with the left being worse than the right.  He describes that this pain will affect and only the neck but it tends to radiate towards the back of the head, bilaterally, and then to the top of the head and what seems to be the distribution of the greater occipital nerve.  He has also been experiencing pain in his eyes with blurry vision.  I have recommended that he go see an ophthalmologist and as it turns out he has an appointment for the 18th of this month.  His next area of pain is that of the lower back, with the pain being in the midline but spreading bilaterally.  After a long conversation today we formulated a plan where we would be bringing the patient in for a cervical epidural steroid injection under fluoroscopic guidance and IV sedation to see if that would help his neck and upper extremity pain.  He does have a history significant for severe cervical facet arthropathy  affecting the upper levels, which would explain his cervicalgia since the origin of the greater and lesser occipital nerves is the area of the C2 and C3 nerve roots.  The patient agreed with the plan to come in for the cervical epidural steroid injection to see if we can tell some of this pain down.  Pharmacotherapy Assessment  Analgesic: Oxycodone 5 mg, 1 tab PO q 8 hrs (15 mg/day of oxycodone) MME/day: 22.5 mg/day.   Monitoring: [place] PMP: PDMP reviewed during this encounter.       Pharmacotherapy: No side-effects or adverse reactions reported. Compliance: No problems identified. Effectiveness: Clinically acceptable. Plan: Refer to "POC".  UDS:  Summary  Date Value Ref Range Status  08/06/2019 Note  Final    Comment:    ==================================================================== [employer] [facility] (MW) ==================================================================== Test                             Result       Flag       Units Drug Present   Oxycodone                      886                     ng/mg creat   Oxymorphone                    886  ng/mg creat   Noroxycodone                   1251                    ng/mg creat   Noroxymorphone                 361                     ng/mg creat    Sources of oxycodone are scheduled prescription medications.    Oxymorphone, noroxycodone, and noroxymorphone are expected    metabolites of oxycodone. Oxymorphone is also available as a    scheduled prescription medication. ==================================================================== Test                      Result    Flag   Units      Ref Range   Creatinine              51               mg/dL      >=20 ==================================================================== Declared Medications:  Medication list was not provided. ==================================================================== For clinical consultation, please call [phone]. ====================================================================    Laboratory Chemistry Profile   Renal Lab Results  Component Value Date   BUN 11 08/06/2019   CREATININE 0.93 08/06/2019   BCR 12 08/06/2019   GFRAA 99 08/06/2019   GFRNONAA 86 08/06/2019    Hepatic Lab Results  Component Value Date   AST 23 08/06/2019   ALT 41 11/25/2015   ALBUMIN 4.3 08/06/2019   ALKPHOS 72 08/06/2019    Electrolytes Lab Results  Component Value Date   NA 142 08/06/2019   K 4.3 08/06/2019   CL 104 08/06/2019   CALCIUM 9.2 08/06/2019   MG 2.0 08/06/2019    Bone Lab Results  Component Value Date   25OHVITD1 49 08/06/2019   25OHVITD2 <1.0 08/06/2019   25OHVITD3 49 08/06/2019    Inflammation (CRP: Acute Phase) (ESR: Chronic Phase) Lab Results  Component Value Date   CRP <1 08/06/2019   ESRSEDRATE 2 08/06/2019      Note: Above Lab results reviewed.  Imaging  MR LUMBAR SPINE WO CONTRAST CLINICAL DATA:  Chronic low back pain for 5-6 months. BILATERAL hip pain, worse on the LEFT.  EXAM: MRI LUMBAR SPINE WITHOUT CONTRAST  TECHNIQUE: Multiplanar, multisequence MR imaging of the lumbar spine was performed. No intravenous contrast was administered.  COMPARISON:  MRI lumbar spine 08/31/2014.  FINDINGS: Segmentation:  Standard.  Alignment:  Physiologic.  Vertebrae: No fracture, evidence of discitis, or bone lesion. Minor anterior endplate edema E9-9, unchanged.  Conus medullaris and cauda equina: Conus extends to the L1 level. Conus and cauda equina appear normal.  Paraspinal and other soft tissues: Unremarkable. Renal cystic disease incompletely evaluated.  Disc levels:  L1-L2:  Normal.  L2-L3:  Normal.  L3-L4:  Normal.  L4-L5: Minor bulge. Good preservation of disc height. Mild facet arthropathy. No impingement.  L5-S1:  Disc space narrowing.  Annular bulge.  No impingement.  Compared with priors, similar appearance.  IMPRESSION: Minor lumbar  spondylosis. No spinal stenosis, frank disc protrusion, or compressive lesion.  Electronically Signed   By: Staci Righter M.D.   On: 07/12/2018 13:18  Assessment  The primary encounter diagnosis was Cervicalgia. 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  Plan of Care  Problem-specific:  No problem-specific Assessment & Plan notes found for this encounter.  Mr. TRUTH Mercer has a current medication list which includes the following long-term medication(s): cholecalciferol, [START ON 08/20/2020] gabapentin, [START ON 12/20/2019] oxycodone, [START ON 01/19/2020] oxycodone, [START ON 02/18/2020] oxycodone, pantoprazole, and simvastatin.  Pharmacotherapy (Medications Ordered): Meds ordered this encounter  Medications  . oxyCODONE (OXY IR/ROXICODONE) 5 MG immediate release tablet    Sig: Take 1 tablet (5 mg total) by mouth every 8 (eight) hours as needed for severe pain. Must last 30 days    Dispense:  90 tablet    Refill:  0    Chronic Pain: STOP Act (Not applicable) Fill 1 day early if closed on refill date. Do not fill until: 12/20/2019. To last until: 01/19/2020. Avoid benzodiazepines within 8 hours of opioids  . oxyCODONE (OXY IR/ROXICODONE) 5 MG immediate release tablet    Sig: Take 1 tablet (5 mg total) by mouth every 8 (eight) hours as needed for severe pain. Must last 30 days    Dispense:  90 tablet    Refill:  0    Chronic Pain: STOP Act (Not applicable) Fill 1 day early if closed on refill date. Do not fill until: 01/19/2020. To last until: 02/18/2020. Avoid benzodiazepines within 8 hours of opioids  . oxyCODONE (OXY IR/ROXICODONE) 5  MG immediate release tablet    Sig: Take 1 tablet (5 mg total) by mouth every 8 (eight) hours as needed for severe pain. Must last 30 days    Dispense:  90 tablet    Refill:  0    Chronic Pain: STOP Act (Not applicable) Fill 1 day early if closed on refill date. Do not fill until: 02/18/2020. To last until: 03/19/2020. Avoid benzodiazepines within 8 hours of opioids  . ALPRAZolam (XANAX) 1 MG tablet    Sig: Take 1 tablet 45 minutes and 1 tablet just prior to MRI. Have a driver.    Dispense:  2 tablet    Refill:  0    Remind the patient not to drive after having taken this medication.   Orders:  Orders Placed This Encounter  Procedures  . Cervical Epidural Injection    Level(s): C7-T1 Laterality: TBD Purpose: Diagnostic/Therapeutic Indication(s): Radiculitis and cervicalgia associater with cervical degenerative disc disease.    Standing Status:   Future    Standing Expiration Date:   12/27/2019    Scheduling Instructions:     Procedure: Cervical Epidural Steroid Injection/Block     Sedation: Patient's choice.     Timeframe: As soon as schedule allows    Order Specific Question:   Where will this procedure be performed?    Answer:   [facility]    Comments:   by Dr. Dossie Arbour  . MR CERVICAL SPINE WO CONTRAST    Patient presents with axial pain with possible radicular component.  In addition to any acute findings, please report on:  1. Facet (Zygapophyseal) joint DJD (Hypertrophy, space narrowing, subchondral sclerosis, and/or osteophyte formation) 2. DDD and/or IVDD (Loss of disc height, desiccation or "Black disc disease") 3. Pars defects 4. Spondylolisthesis, spondylosis, and/or spondyloarthropathies (include Degree/Grade of displacement in mm) 5. Vertebral body Fractures, including age (old, new/acute) 41. Modic Type Changes 7. Demineralization 8. Bone pathology 9. Central, Lateral Recess, and/or  Foraminal Stenosis (include AP diameter of stenosis in mm) 10. Surgical changes  (hardware type, status, and presence of fibrosis) NOTE: Please specify level(s) and laterality.    Standing Status:   Future    Standing Expiration Date:   02/26/2020    Order Specific Question:   What is the patient's sedation requirement?    Answer:   No Sedation    Order Specific Question:   Does the patient have a pacemaker or implanted devices?    Answer:   No    Order Specific Question:   Preferred imaging location?    Answer:   [facility] (table limit-350lbs)    Order Specific Question:   Call Results- Best Contact Number?    Answer:   [phone] ([facility])    Order Specific Question:   Radiology Contrast Protocol - do NOT remove file path    Answer:   [email or web address]    Order Specific Question:   ** REASON FOR EXAM (FREE TEXT)    Answer:   Neck Pain & Radiculitis   Follow-up plan:   Return in about 4 months (around 03/19/2020) for (VV), (MM), in addition, Procedure (w/ sedation): (L) CESI, (ASAP).      Considering:   Palliative Left cervical epidural steroid injection  Possible bilateral sacroiliac joint + lumbar facet RFA Diagnostic Bilateral HipBlock Diagnostic bilateral femoral nerve and obturator nerve block Possible bilateral femoral nerve and obturator nerve RFA Diagnostic bilateral cervical facet block. Possible bilateral cervical facet RFA. Diagnostic Caudal ESI + diagnostic epidurogram Diagnostic Right Knee injection. Possible series of 5 right-sided intra-articular [place] injections Diagnostic right Genicular NB Possible right Genicular Nerve RFA. Diagnostic bilateral intra-articular shoulderinjection Diagnostic bilateral suprascapular NB. Possible bilateral suprascapular Nerve RFA.   Palliative PRN treatment(s):   Palliative left-sided lumbar facet RFA (last done on 03/21/2017)  Palliative left-sided sacroiliac joint RFA (last done on 03/21/2017) Diagnostic Bilateral Lumbar Facet & Bilateral S-I joint  block Diagnostic Bilateral Hip Block Diagnostic bilateral cervical facetblock. Diagnostic Caudal ESI. Diagnostic Left CESI. Diagnostic Right Knee injection. Diagnostic right Genicular NB Diagnostic bilateral intra-articular shoulderinj. Diagnostic bilateral suprascapular NB.    Recent Visits Date Type Provider Dept  08/29/19 Telemedicine Milinda Pointer, MD [facility]  Showing recent visits within past 90 days and meeting all other requirements   Today's Visits Date Type Provider Dept  11/26/19 Telemedicine Milinda Pointer, MD [facility]  Showing today's visits and meeting all other requirements   Future Appointments Date Type Provider Dept  12/20/19 Appointment Milinda Pointer, MD [facility]  Showing future appointments within next 90 days and meeting all other requirements   I discussed the assessment and treatment plan with the patient. The patient was provided an opportunity to ask questions and all were answered. The patient agreed with the plan and demonstrated an understanding of the instructions.  Patient advised to call back or seek an in-person evaluation if the symptoms or condition worsens.  Duration of encounter: 22 minutes.  Note by: Gaspar Cola, MD Date: 11/26/2019; Time: 3:57 PM

## 2019-11-26 ENCOUNTER — Ambulatory Visit: Payer: Medicare Other | Attending: Pain Medicine | Admitting: Pain Medicine

## 2019-11-26 ENCOUNTER — Other Ambulatory Visit: Payer: Self-pay

## 2019-11-26 DIAGNOSIS — G894 Chronic pain syndrome: Secondary | ICD-10-CM

## 2019-11-26 DIAGNOSIS — M5481 Occipital neuralgia: Secondary | ICD-10-CM | POA: Diagnosis not present

## 2019-11-26 DIAGNOSIS — M4802 Spinal stenosis, cervical region: Secondary | ICD-10-CM

## 2019-11-26 DIAGNOSIS — M79601 Pain in right arm: Secondary | ICD-10-CM

## 2019-11-26 DIAGNOSIS — G4486 Cervicogenic headache: Secondary | ICD-10-CM

## 2019-11-26 DIAGNOSIS — M5030 Other cervical disc degeneration, unspecified cervical region: Secondary | ICD-10-CM | POA: Diagnosis not present

## 2019-11-26 DIAGNOSIS — Z8659 Personal history of other mental and behavioral disorders: Secondary | ICD-10-CM | POA: Insufficient documentation

## 2019-11-26 DIAGNOSIS — F411 Generalized anxiety disorder: Secondary | ICD-10-CM

## 2019-11-26 DIAGNOSIS — M47812 Spondylosis without myelopathy or radiculopathy, cervical region: Secondary | ICD-10-CM

## 2019-11-26 DIAGNOSIS — R519 Headache, unspecified: Secondary | ICD-10-CM

## 2019-11-26 DIAGNOSIS — M542 Cervicalgia: Secondary | ICD-10-CM | POA: Diagnosis not present

## 2019-11-26 DIAGNOSIS — G8929 Other chronic pain: Secondary | ICD-10-CM

## 2019-11-26 DIAGNOSIS — M79602 Pain in left arm: Secondary | ICD-10-CM

## 2019-11-26 MED ORDER — ALPRAZOLAM 1 MG PO TABS: ORAL_TABLET | ORAL | 0 refills | Status: DC

## 2019-11-26 MED ORDER — OXYCODONE HCL 5 MG PO TABS: 5.0000 mg | ORAL_TABLET | Freq: Three times a day (TID) | ORAL | 0 refills | Status: DC | PRN

## 2019-11-26 NOTE — Patient Instructions (Signed)

## 2019-11-27 ENCOUNTER — Telehealth: Payer: Self-pay | Admitting: *Deleted

## 2019-12-04 ENCOUNTER — Ambulatory Visit (HOSPITAL_BASED_OUTPATIENT_CLINIC_OR_DEPARTMENT_OTHER): Payer: Medicare Other | Admitting: Pain Medicine

## 2019-12-04 ENCOUNTER — Other Ambulatory Visit: Payer: Self-pay

## 2019-12-04 ENCOUNTER — Encounter: Payer: Self-pay | Admitting: Pain Medicine

## 2019-12-04 ENCOUNTER — Ambulatory Visit
Admission: RE | Admit: 2019-12-04 | Discharge: 2019-12-04 | Disposition: A | Discharge status: Home / Self Care | Payer: Medicare Other | Source: Ambulatory Visit | Attending: Pain Medicine | Admitting: Pain Medicine

## 2019-12-04 VITALS — BP 107/78 | HR 60 | Temp 98.1°F | Resp 13 | Ht 69.0 in | Wt 165.0 lb

## 2019-12-04 DIAGNOSIS — G4486 Cervicogenic headache: Secondary | ICD-10-CM

## 2019-12-04 DIAGNOSIS — R519 Headache, unspecified: Secondary | ICD-10-CM | POA: Diagnosis present

## 2019-12-04 DIAGNOSIS — M79601 Pain in right arm: Secondary | ICD-10-CM | POA: Diagnosis present

## 2019-12-04 DIAGNOSIS — G8929 Other chronic pain: Secondary | ICD-10-CM

## 2019-12-04 DIAGNOSIS — M5481 Occipital neuralgia: Secondary | ICD-10-CM

## 2019-12-04 DIAGNOSIS — M4802 Spinal stenosis, cervical region: Secondary | ICD-10-CM | POA: Insufficient documentation

## 2019-12-04 DIAGNOSIS — M542 Cervicalgia: Secondary | ICD-10-CM

## 2019-12-04 DIAGNOSIS — M79602 Pain in left arm: Secondary | ICD-10-CM | POA: Insufficient documentation

## 2019-12-04 DIAGNOSIS — M5030 Other cervical disc degeneration, unspecified cervical region: Secondary | ICD-10-CM | POA: Diagnosis present

## 2019-12-04 DIAGNOSIS — M47812 Spondylosis without myelopathy or radiculopathy, cervical region: Secondary | ICD-10-CM

## 2019-12-04 MED ORDER — DEXAMETHASONE SODIUM PHOSPHATE 10 MG/ML IJ SOLN
INTRAMUSCULAR | Status: AC
  Filled 2019-12-04: qty 1

## 2019-12-04 MED ORDER — DEXAMETHASONE SODIUM PHOSPHATE 10 MG/ML IJ SOLN
10.0000 mg | Freq: Once | INTRAMUSCULAR | Status: AC
  Administered 2019-12-04: 10 mg

## 2019-12-04 MED ORDER — LIDOCAINE HCL 2 % IJ SOLN
20.0000 mL | Freq: Once | INTRAMUSCULAR | Status: AC
  Administered 2019-12-04: 400 mg

## 2019-12-04 MED ORDER — IOHEXOL 180 MG/ML  SOLN
10.0000 mL | Freq: Once | INTRAMUSCULAR | Status: AC
  Administered 2019-12-04: 10 mL via EPIDURAL

## 2019-12-04 MED ORDER — SODIUM CHLORIDE (PF) 0.9 % IJ SOLN
INTRAMUSCULAR | Status: AC
  Filled 2019-12-04: qty 10

## 2019-12-04 MED ORDER — SODIUM CHLORIDE 0.9% FLUSH
1.0000 mL | Freq: Once | INTRAVENOUS | Status: AC
  Administered 2019-12-04: 1 mL

## 2019-12-04 MED ORDER — LIDOCAINE HCL 2 % IJ SOLN
INTRAMUSCULAR | Status: AC
  Filled 2019-12-04: qty 20

## 2019-12-04 MED ORDER — ROPIVACAINE HCL 2 MG/ML IJ SOLN
INTRAMUSCULAR | Status: AC
  Filled 2019-12-04: qty 10

## 2019-12-04 MED ORDER — IOHEXOL 180 MG/ML  SOLN
INTRAMUSCULAR | Status: AC
  Filled 2019-12-04: qty 20

## 2019-12-04 MED ORDER — ROPIVACAINE HCL 2 MG/ML IJ SOLN
1.0000 mL | Freq: Once | INTRAMUSCULAR | Status: AC
  Administered 2019-12-04: 1 mL via EPIDURAL

## 2019-12-04 NOTE — Patient Instructions (Addendum)
____________________________________________________________________________________________  Post-Procedure Discharge Instructions  Instructions:  Apply ice:   Purpose: This will minimize any swelling and discomfort after procedure.   When: Day of procedure, as soon as you get home.  How: Fill a plastic sandwich bag with crushed ice. Cover it with a small towel and apply to injection site.  How long: (15 min on, 15 min off) Apply for 15 minutes then remove x 15 minutes.  Repeat sequence on day of procedure, until you go to bed.  Apply heat:   Purpose: To treat any soreness and discomfort from the procedure.  When: Starting the next day after the procedure.  How: Apply heat to procedure site starting the day following the procedure.  How long: May continue to repeat daily, until discomfort goes away.  Food intake: Start with clear liquids (like water) and advance to regular food, as tolerated.   Physical activities: Keep activities to a minimum for the first 8 hours after the procedure. After that, then as tolerated.  Driving: If you have received any sedation, be responsible and do not drive. You are not allowed to drive for 24 hours after having sedation.  Blood thinner: (Applies only to those taking blood thinners) You may restart your blood thinner 6 hours after your procedure.  Insulin: (Applies only to Diabetic patients taking insulin) As soon as you can eat, you may resume your normal dosing schedule.  Infection prevention: Keep procedure site clean and dry. Shower daily and clean area with soap and water.  Post-procedure Pain Diary: Extremely important that this be done correctly and accurately. Recorded information will be used to determine the next step in treatment. For the purpose of accuracy, follow these rules:  Evaluate only the area treated. Do not report or include pain from an untreated area. For the purpose of this evaluation, ignore all other areas of pain,  except for the treated area.  After your procedure, avoid taking a long nap and attempting to complete the pain diary after you wake up. Instead, set your alarm clock to go off every hour, on the hour, for the initial 8 hours after the procedure. Document the duration of the numbing medicine, and the relief you are getting from it.  Do not go to sleep and attempt to complete it later. It will not be accurate. If you received sedation, it is likely that you were given a medication that may cause amnesia. Because of this, completing the diary at a later time may cause the information to be inaccurate. This information is needed to plan your care.  Follow-up appointment: Keep your post-procedure follow-up evaluation appointment after the procedure (usually 2 weeks for most procedures, 6 weeks for radiofrequencies). DO NOT FORGET to bring you pain diary with you.   Expect: (What should I expect to see with my procedure?)  From numbing medicine (AKA: Local Anesthetics): Numbness or decrease in pain. You may also experience some weakness, which if present, could last for the duration of the local anesthetic.  Onset: Full effect within 15 minutes of injected.  Duration: It will depend on the type of local anesthetic used. On the average, 1 to 8 hours.   From steroids (Applies only if steroids were used): Decrease in swelling or inflammation. Once inflammation is improved, relief of the pain will follow.  Onset of benefits: Depends on the amount of swelling present. The more swelling, the longer it will take for the benefits to be seen. In some cases, up to 10 days.    Duration: Steroids will stay in the system x 2 weeks. Duration of benefits will depend on multiple posibilities including persistent irritating factors.  Side-effects: If present, they may typically last 2 weeks (the duration of the steroids).  Frequent: Cramps (if they occur, drink Gatorade and take over-the-counter Magnesium 450-500 mg  once to twice a day); water retention with temporary weight gain; increases in blood sugar; decreased immune system response; increased appetite.  Occasional: Facial flushing (red, warm cheeks); mood swings; menstrual changes.  Uncommon: Long-term decrease or suppression of natural hormones; bone thinning. (These are more common with higher doses or more frequent use. This is why we prefer that our patients avoid having any injection therapies in other practices.)   Very Rare: Severe mood changes; psychosis; aseptic necrosis.  From procedure: Some discomfort is to be expected once the numbing medicine wears off. This should be minimal if ice and heat are applied as instructed.  Call if: (When should I call?)  You experience numbness and weakness that gets worse with time, as opposed to wearing off.  New onset bowel or bladder incontinence. (Applies only to procedures done in the spine)  Emergency Numbers:  Durning business hours (Monday - Thursday, 8:00 AM - 4:00 PM) (Friday, 9:00 AM - 12:00 Noon): [phone]  After hours: [phone]  NOTE: If you are having a problem and are unable connect with, or to talk to a provider, then go to your nearest urgent care or emergency department. If the problem is serious and urgent, please call 911. ____________________________________________________________________________________________   Epidural Steroid Injection Patient Information  Description: The epidural space surrounds the nerves as they exit the spinal cord.  In some patients, the nerves can be compressed and inflamed by a bulging disc or a tight spinal canal (spinal stenosis).  By injecting steroids into the epidural space, we can bring irritated nerves into direct contact with a potentially helpful medication.  These steroids act directly on the irritated nerves and can reduce swelling and inflammation which often leads to decreased pain.  Epidural steroids may be injected  anywhere along the spine and from the neck to the low back depending upon the location of your pain.   After numbing the skin with local anesthetic (like Novocaine), a small needle is passed into the epidural space slowly.  You may experience a sensation of pressure while this is being done.  The entire block usually last less than 10 minutes.  Conditions which may be treated by epidural steroids:   Low back and leg pain  Neck and arm pain  Spinal stenosis  Post-laminectomy syndrome  Herpes zoster (shingles) pain  Pain from compression fractures  Preparation for the injection:  1. Do not eat any solid food or dairy products within 8 hours of your appointment.  2. You may drink clear liquids up to 3 hours before appointment.  Clear liquids include water, black coffee, juice or soda.  No milk or cream please. 3. You may take your regular medication, including pain medications, with a sip of water before your appointment  Diabetics should hold regular insulin (if taken separately) and take 1/2 normal NPH dos the morning of the procedure.  Carry some sugar containing items with you to your appointment. 4. A driver must accompany you and be prepared to drive you home after your procedure.  5. Bring all your current medications with your. 6. An IV may be inserted and sedation may be given at the discretion of the physician.     7. A blood pressure cuff, EKG and other monitors will often be applied during the procedure.  Some patients may need to have extra oxygen administered for a short period. 8. You will be asked to provide medical information, including your allergies, prior to the procedure.  We must know immediately if you are taking blood thinners (like Coumadin/Warfarin)  Or if you are allergic to IV iodine contrast (dye). We must know if you could possible be pregnant.  Possible side-effects:  Bleeding from needle site  Infection (rare, may require surgery)  Nerve injury  (rare)  Numbness & tingling (temporary)  Difficulty urinating (rare, temporary)  Spinal headache ( a headache worse with upright posture)  Light -headedness (temporary)  Pain at injection site (several days)  Decreased blood pressure (temporary)  Weakness in arm/leg (temporary)  Pressure sensation in back/neck (temporary)  Call if you experience:  Fever/chills associated with headache or increased back/neck pain.  Headache worsened by an upright position.  New onset weakness or numbness of an extremity below the injection site  Hives or difficulty breathing (go to the emergency room)  Inflammation or drainage at the infection site  Severe back/neck pain  Any new symptoms which are concerning to you  Please note:  Although the local anesthetic injected can often make your back or neck feel good for several hours after the injection, the pain will likely return.  It takes 3-7 days for steroids to work in the epidural space.  You may not notice any pain relief for at least that one week.  If effective, we will often do a series of three injections spaced 3-6 weeks apart to maximally decrease your pain.  After the initial series, we generally will wait several months before considering a repeat injection of the same type.  If you have any questions, please call [phone] [facility]  General Discharge Instructions :  If you need to reach your doctor call: Monday-Friday 8:00 am - 4:00 pm at [phone] or toll free [phone].  After clinic hours [phone] to have operator reach doctor.  Bring all of your medication bottles to all your appointments in the pain clinic.  To cancel or reschedule your appointment with Pain Management please remember to call 24 hours in advance to avoid a fee.  Refer to the educational materials which you have been given on: General Risks, I had my Procedure.  Discharge Instructions, Post Sedation.  Post Procedure Instructions:  The drugs you were given will stay in your system until tomorrow, so for the next 24 hours you should not drive, make any legal decisions or drink any alcoholic beverages.  You may eat anything you prefer, but it is better to start with liquids then soups and crackers, and gradually work up to solid foods.  Please notify your doctor immediately if you have any unusual bleeding, trouble breathing or pain that is not related to your normal pain.  Depending on the type of procedure that was done, some parts of your body may feel week and/or numb.  This usually clears up by tonight or the next day.  Walk with the use of an assistive device or accompanied by an adult for the 24 hours.  You may use ice on the affected area for the first 24 hours.  Put ice in a Ziploc bag and cover with a towel and place against area 15 minutes on 15 minutes off.  You may switch to heat after   24 hours. 

## 2019-12-04 NOTE — Progress Notes (Signed)
PROVIDER NOTE: Information contained herein reflects review and annotations entered in association with encounter. Interpretation of such information and data should be left to medically-trained personnel. Information provided to patient can be located elsewhere in the medical record under "Patient Instructions". Document created using STT-dictation technology, any transcriptional errors that may result from process are unintentional.    Patient: Ricky Mercer  Service Category: Procedure  Provider: Gaspar Cola, MD  DOB: Feb 25, 1951  DOS: 12/04/2019  Location: [facility]  MRN: [id number]  Setting: Ambulatory - outpatient  Referring Provider: Milinda Pointer, MD  Type: Established Patient  Specialty: Interventional Pain Management  PCP: Sofie Hartigan, MD   Primary Reason for Visit: Interventional Pain Management Treatment. CC: Neck Pain (left side is worse  )  Procedure:          Anesthesia, Analgesia, Anxiolysis:  Type: Therapeutic, Inter-Laminar, Cervical Epidural Steroid Injection  #3  Region: Posterior Cervico-thoracic Region Level: C7-T1 Laterality: Left-Sided Paramedial  Type: Local Anesthesia Indication(s): Analgesia         Route: Infiltration ([place]/IM) IV Access: Declined Sedation: Declined  Local Anesthetic: Lidocaine 1-2%  Position: Prone with head of the table was raised to facilitate breathing.   Indications: 1. Cervicalgia   2. 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 Cervical spondylosis   4. DDD (degenerative disc disease), cervical   5. Cervico-occipital neuralgia   6. Cervicogenic headache   7. Cervical facet syndrome (R>L)   8. Cervical facet hypertrophy    Pain Score: Pre-procedure: 6 /10 Post-procedure: 3 /10   Pre-op Assessment:  Mr. Gelder is a 69 y.o. (year old), male patient, seen today for interventional treatment. He  has a past surgical history that includes Nose surgery; Hand surgery (Bilateral);  Colonoscopy (10/24/2012); Upper gi endoscopy (12/18/2012); Tonsillectomy (2009); Skin cancer excision (Left, 2015); Mass excision (Right, 07/18/2015); Esophagogastroduodenoscopy (egd) with propofol (N/A, 10/28/2015); prostate seeding; Hernia repair; Cholecystectomy (N/A, 12/02/2015); Esophagogastroduodenoscopy (N/A, 11/17/2017); and Colonoscopy with propofol (N/A, 11/17/2017). Mr. Bhola has a current medication list which includes the following prescription(s): alprazolam, aspirin, cholecalciferol, [START ON 08/20/2020] gabapentin, naproxen, [START ON 12/20/2019] oxycodone, [START ON 01/19/2020] oxycodone, [START ON 02/18/2020] oxycodone, pantoprazole, and simvastatin. His primarily concern today is the Neck Pain (left side is worse  )  Initial Vital Signs:  Pulse/HCG Rate: 61ECG Heart Rate: 64 Temp: 98.1 F (36.7 C) Resp: 16 BP: 117/77 SpO2: 100 %  BMI: Estimated body mass index is 24.37 kg/m as calculated from the following:   Height as of this encounter: 5\' 9"  (1.753 m).   Weight as of this encounter: 165 lb (74.8 kg).  Risk Assessment: Allergies: Reviewed. He has No Known Allergies.  Allergy Precautions: None required Coagulopathies: Reviewed. None identified.  Blood-thinner therapy: None at this time Active Infection(s): Reviewed. None identified. Mr. Kinlaw is afebrile  Site Confirmation: Mr. Kint was asked to confirm the procedure and laterality before marking the site Procedure checklist: Completed Consent: Before the procedure and under the influence of no sedative(s), amnesic(s), or anxiolytics, the patient was informed of the treatment options, risks and possible complications. To fulfill our ethical and legal obligations, as recommended by the American Medical Association's Code of Ethics, I have informed the patient of my clinical impression; the nature and purpose of the treatment or procedure; the risks, benefits, and possible complications of the intervention; the alternatives,  including doing nothing; the risk(s) and benefit(s) of the alternative treatment(s) or procedure(s); and the risk(s) and benefit(s) of doing nothing.  The patient was provided information about the general risks and possible complications associated with the procedure. These may include, but are not limited to: failure to achieve desired goals, infection, bleeding, organ or nerve damage, allergic reactions, paralysis, and death. In addition, the patient was informed of those risks and complications associated to Spine-related procedures, such as failure to decrease pain; infection (i.e.: Meningitis, epidural or intraspinal abscess); bleeding (i.e.: epidural hematoma, subarachnoid hemorrhage, or any other type of intraspinal or peri-dural bleeding); organ or nerve damage (i.e.: Any type of peripheral nerve, nerve root, or spinal cord injury) with subsequent damage to sensory, motor, and/or autonomic systems, resulting in permanent pain, numbness, and/or weakness of one or several areas of the body; allergic reactions; (i.e.: anaphylactic reaction); and/or death. Furthermore, the patient was informed of those risks and complications associated with the medications. These include, but are not limited to: allergic reactions (i.e.: anaphylactic or anaphylactoid reaction(s)); adrenal axis suppression; blood sugar elevation that in diabetics may result in ketoacidosis or comma; water retention that in patients with history of congestive heart failure may result in shortness of breath, pulmonary edema, and decompensation with resultant heart failure; weight gain; swelling or edema; medication-induced neural toxicity; particulate matter embolism and blood vessel occlusion with resultant organ, and/or nervous system infarction; and/or aseptic necrosis of one or more joints. Finally, the patient was informed that Medicine is not an exact science; therefore, there is also the possibility of unforeseen or unpredictable risks  and/or possible complications that may result in a catastrophic outcome. The patient indicated having understood very clearly. We have given the patient no guarantees and we have made no promises. Enough time was given to the patient to ask questions, all of which were answered to the patient's satisfaction. Mr. Graetz has indicated that he wanted to continue with the procedure. Attestation: I, the ordering provider, attest that I have discussed with the patient the benefits, risks, side-effects, alternatives, likelihood of achieving goals, and potential problems during recovery for the procedure that I have provided informed consent. Date  Time: 12/04/2019  9:29 AM  Pre-Procedure Preparation:  Monitoring: As per clinic protocol. Respiration, ETCO2, SpO2, BP, heart rate and rhythm monitor placed and checked for adequate function Safety Precautions: Patient was assessed for positional comfort and pressure points before starting the procedure. Time-out: I initiated and conducted the "Time-out" before starting the procedure, as per protocol. The patient was asked to participate by confirming the accuracy of the "Time Out" information. Verification of the correct person, site, and procedure were performed and confirmed by me, the nursing staff, and the patient. "Time-out" conducted as per Joint Commission's Universal Protocol (UP.01.01.01). Time: [id number]  Description of Procedure:          Target Area: For Epidural Steroid injections the target is the interlaminar space, initially targeting the lower border of the superior vertebral body lamina. Approach: Paramedial approach. Area Prepped: Entire PosteriorCervical Region Prepping solution: DuraPrep (Iodine Povacrylex [0.7% available iodine] and Isopropyl Alcohol, 74% w/w) Safety Precautions: Aspiration looking for blood return was conducted prior to all injections. At no point did we inject any substances, as a needle was being advanced. No attempts were  made at seeking any paresthesias. Safe injection practices and needle disposal techniques used. Medications properly checked for expiration dates. SDV (single dose vial) medications used. Description of the Procedure: Protocol guidelines were followed. The procedure needle was introduced through the skin, ipsilateral to the reported pain, and advanced to the target area. Bone was contacted and the  needle walked caudad, until the lamina was cleared. The epidural space was identified using "loss-of-resistance technique" with 2-3 ml of PF-NaCl (0.9% NSS), in a 5cc LOR glass syringe. Vitals:   12/04/19 0920 12/04/19 0954 12/04/19 0959 12/04/19 1002  BP: 117/77 104/78 112/85 107/78  Pulse: 61 64 60   Resp: 16 18 20 13   Temp: 98.1 F (36.7 C)     TempSrc: Temporal     SpO2: 100% 100% 100% 100%  Weight: 165 lb (74.8 kg)     Height: 5\' 9"  (1.753 m)       Start Time: 0956 hrs. End Time: 1002 hrs. Materials:  Needle(s) Type: Epidural needle Gauge: 17G Length: 3.5-in Medication(s): Please see orders for medications and dosing details.  Imaging Guidance (Spinal):          Type of Imaging Technique: Fluoroscopy Guidance (Spinal) Indication(s): Assistance in needle guidance and placement for procedures requiring needle placement in or near specific anatomical locations not easily accessible without such assistance. Exposure Time: Please see nurses notes. Contrast: Before injecting any contrast, we confirmed that the patient did not have an allergy to iodine, shellfish, or radiological contrast. Once satisfactory needle placement was completed at the desired level, radiological contrast was injected. Contrast injected under live fluoroscopy. No contrast complications. See chart for type and volume of contrast used. Fluoroscopic Guidance: I was personally present during the use of fluoroscopy. "Tunnel Vision Technique" used to obtain the best possible view of the target area. Parallax error corrected  before commencing the procedure. "Direction-depth-direction" technique used to introduce the needle under continuous pulsed fluoroscopy. Once target was reached, antero-posterior, oblique, and lateral fluoroscopic projection used confirm needle placement in all planes. Images permanently stored in EMR. Interpretation: I personally interpreted the imaging intraoperatively. Adequate needle placement confirmed in multiple planes. Appropriate spread of contrast into desired area was observed. No evidence of afferent or efferent intravascular uptake. No intrathecal or subarachnoid spread observed. Permanent images saved into the patient's record.  Antibiotic Prophylaxis:   Anti-infectives (From admission, onward)   None     Indication(s): None identified  Post-operative Assessment:  Post-procedure Vital Signs:  Pulse/HCG Rate: 6063 Temp: 98.1 F (36.7 C) Resp: 13 BP: 107/78 SpO2: 100 %  EBL: None  Complications: No immediate post-treatment complications observed by team, or reported by patient.  Note: The patient tolerated the entire procedure well. A repeat set of vitals were taken after the procedure and the patient was kept under observation following institutional policy, for this type of procedure. Post-procedural neurological assessment was performed, showing return to baseline, prior to discharge. The patient was provided with post-procedure discharge instructions, including a section on how to identify potential problems. Should any problems arise concerning this procedure, the patient was given instructions to immediately contact us, at any time, without hesitation. In any case, we plan to contact the patient by telephone for a follow-up status report regarding this interventional procedure.  Comments:  No additional relevant information.  Plan of Care  Orders:  Orders Placed This Encounter  Procedures  . Cervical Epidural Injection    Procedure: Cervical Epidural Steroid  Injection/Block Purpose: Palliative Indication(s): Radiculitis and cervicalgia associater with cervical degenerative disc disease.    Scheduling Instructions:     Level(s): C7-T1     Laterality: Left-sided     Sedation: Patient's choice.     Timeframe: Today    Order Specific Question:   Where will this procedure be performed?    Answer:   [facility]  Comments:   by Dr. Dossie Arbour  . DG PA[facility] C-ARM 1-60 MIN NO REPORT    Intraoperative interpretation by procedural physician at [place].    Standing Status:   Standing    Number of Occurrences:   1    Order Specific Question:   Reason for exam:    Answer:   Assistance in needle guidance and placement for procedures requiring needle placement in or near specific anatomical locations not easily accessible without such assistance.  . Informed Consent Details: Physician/Practitioner Attestation; Transcribe to consent form and obtain patient signature    Nursing Order: Transcribe to consent form and obtain patient signature. Note: Always confirm laterality of pain with Mr. Micallef, before procedure.  Procedure: Cervical Epidural Steroid Injection (CESI) under fluoroscopic guidance  Indication/Reason: Cervicalgia (Neck Pain) with or without Cervical Radiculopathy/Radiculitis (Arm/Shoulder Pain, Numbness, and/or weakness), secondary to Cervical and/or Cervicothoracic Degenerative Disc Disease (DDD), with or without Intervertebral Disc Displacement (IVDD).  Provider Attestation: I, Stella Dossie Arbour, MD, (Pain Management Specialist), the physician/practitioner, attest that I have discussed with the patient the benefits, risks, side effects, alternatives, likelihood of achieving goals and potential problems during recovery for the procedure that I have provided informed consent.  . Provide equipment / supplies at bedside    Equipment required: Single use, disposable, "Epidural Tray" Epidural Catheter: NOT required     Standing Status:   Standing    Number of Occurrences:   1    Order Specific Question:   Specify    Answer:   Epidural Tray   Chronic Opioid Analgesic:  Oxycodone 5 mg, 1 tab PO q 8 hrs (15 mg/day of oxycodone) MME/day: 22.5 mg/day.   Medications ordered for procedure: Meds ordered this encounter  Medications  . iohexol (OMNIPAQUE) 180 MG/ML injection 10 mL    Must be Myelogram-compatible. If not available, you may substitute with a water-soluble, non-ionic, hypoallergenic, myelogram-compatible radiological contrast medium.  Marland Kitchen lidocaine (XYLOCAINE) 2 % (with pres) injection 400 mg  . sodium chloride flush (NS) 0.9 % injection 1 mL  . ropivacaine (PF) 2 mg/mL (0.2%) (NAROPIN) injection 1 mL  . dexamethasone (DECADRON) injection 10 mg   Medications administered: We administered iohexol, lidocaine, sodium chloride flush, ropivacaine (PF) 2 mg/mL (0.2%), and dexamethasone.  See the medical record for exact dosing, route, and time of administration.  Follow-up plan:   Return in about 2 weeks (around 12/18/2019) for (VV), (PP).       Considering:   Therapeutic right lumbar facet RFA #1  Therapeutic right SI joint RFA #1  Diagnostic bilateral Hipjoint injection  Diagnostic bilateral cervical facet block  Diagnostic Caudal ESI + diagnostic epidurogram  Diagnostic right Knee injection  Diagnostic right Genicular NB  Diagnostic bilateral IA shoulderinjection  Diagnostic bilateral suprascapular NB    Palliative PRN treatment(s):   Palliative right lumbar facet block #4  Palliative left lumbar facet block #4  Palliative right SI joint block #3  Palliative left SI joint block #3  Palliative left lumbar facet RFA (last done on 03/21/2017)  Palliative left SI joint RFA (last done on 03/21/2017)  Therapeutic/palliative left CESI #4     Recent Visits Date Type Provider Dept  11/26/19 Telemedicine Milinda Pointer, MD [facility]  Showing recent visits within past 90 days and  meeting all other requirements   Today's Visits Date Type Provider Dept  12/04/19 Procedure visit Milinda Pointer, MD [facility]  Showing today's visits and meeting all other requirements   Future  Appointments Date Type Provider Dept  12/20/19 Appointment Milinda Pointer, MD [facility]  Showing future appointments within next 90 days and meeting all other requirements   Disposition: Discharge home  Discharge (Date  Time): 12/04/2019; 1010 hrs.   Primary Care Physician: Sofie Hartigan, MD Location: [facility] Note by: Gaspar Cola, MD Date: 12/04/2019; Time: 3:18 PM  Disclaimer:  Medicine is not an Chief Strategy Officer. The only guarantee in medicine is that nothing is guaranteed. It is important to note that the decision to proceed with this intervention was based on the information collected from the patient. The Data and conclusions were drawn from the patient's questionnaire, the interview, and the physical examination. Because the information was provided in large part by the patient, it cannot be guaranteed that it has not been purposely or unconsciously manipulated. Every effort has been made to obtain as much relevant data as possible for this evaluation. It is important to note that the conclusions that lead to this procedure are derived in large part from the available data. Always take into account that the treatment will also be dependent on availability of resources and existing treatment guidelines, considered by other Pain Management Practitioners as being common knowledge and practice, at the time of the intervention. For Medico-Legal purposes, it is also important to point out that variation in procedural techniques and pharmacological choices are the acceptable norm. The indications, contraindications, technique, and results of the above procedure should only be interpreted and judged by a Board-Certified  Interventional Pain Specialist with extensive familiarity and expertise in the same exact procedure and technique.

## 2019-12-05 ENCOUNTER — Telehealth: Payer: Self-pay | Admitting: *Deleted

## 2019-12-05 NOTE — Telephone Encounter (Signed)
No problems post procedure. 

## 2019-12-07 ENCOUNTER — Ambulatory Visit
Admission: RE | Admit: 2019-12-07 | Discharge: 2019-12-07 | Disposition: A | Discharge status: Home / Self Care | Payer: Medicare Other | Source: Ambulatory Visit | Attending: Pain Medicine | Admitting: Pain Medicine

## 2019-12-07 ENCOUNTER — Other Ambulatory Visit: Payer: Self-pay

## 2019-12-07 DIAGNOSIS — M542 Cervicalgia: Secondary | ICD-10-CM

## 2019-12-07 DIAGNOSIS — M5481 Occipital neuralgia: Secondary | ICD-10-CM | POA: Diagnosis present

## 2019-12-07 DIAGNOSIS — G4486 Cervicogenic headache: Secondary | ICD-10-CM

## 2019-12-07 DIAGNOSIS — R519 Headache, unspecified: Secondary | ICD-10-CM | POA: Insufficient documentation

## 2019-12-07 DIAGNOSIS — M79602 Pain in left arm: Secondary | ICD-10-CM | POA: Insufficient documentation

## 2019-12-07 DIAGNOSIS — M5030 Other cervical disc degeneration, unspecified cervical region: Secondary | ICD-10-CM

## 2019-12-07 DIAGNOSIS — M79601 Pain in right arm: Secondary | ICD-10-CM | POA: Insufficient documentation

## 2019-12-07 DIAGNOSIS — G8929 Other chronic pain: Secondary | ICD-10-CM | POA: Diagnosis present

## 2019-12-07 DIAGNOSIS — M4802 Spinal stenosis, cervical region: Secondary | ICD-10-CM

## 2019-12-07 DIAGNOSIS — M47812 Spondylosis without myelopathy or radiculopathy, cervical region: Secondary | ICD-10-CM

## 2019-12-19 NOTE — Progress Notes (Signed)
Pain relief after procedure (treated area only): (Questions asked to patient) 1. Starting about 15 minutes after the procedure, and "while the area was still numb" (from the local anesthetics), were you having any of your usual pain "in that area" (the treated area)?  (NOTE: NOT including the discomfort from the needle sticks.) First 1 hour: 50  % better. First 4-6 hours: 50 % better. 2. How long did the numbness from the local anesthetics last? (More than 6 hours?) Duration:   3. How much better is your pain now, when compared to before the procedure? Current benefit: 50 for only 2 days % better. 4. Can you move better now? Improvement in ROM (Range of Motion): No. 5. Can you do more now? Improvement in function: No. 4. Did you have any problems with the procedure? Side-effects/Complications: No.

## 2019-12-20 ENCOUNTER — Ambulatory Visit: Payer: Medicare Other | Attending: Pain Medicine | Admitting: Pain Medicine

## 2019-12-20 ENCOUNTER — Other Ambulatory Visit: Payer: Self-pay

## 2019-12-20 DIAGNOSIS — M5481 Occipital neuralgia: Secondary | ICD-10-CM | POA: Diagnosis not present

## 2019-12-20 DIAGNOSIS — G4486 Cervicogenic headache: Secondary | ICD-10-CM

## 2019-12-20 DIAGNOSIS — M542 Cervicalgia: Secondary | ICD-10-CM

## 2019-12-20 DIAGNOSIS — M47812 Spondylosis without myelopathy or radiculopathy, cervical region: Secondary | ICD-10-CM | POA: Diagnosis not present

## 2019-12-20 DIAGNOSIS — R937 Abnormal findings on diagnostic imaging of other parts of musculoskeletal system: Secondary | ICD-10-CM

## 2019-12-20 DIAGNOSIS — R519 Headache, unspecified: Secondary | ICD-10-CM

## 2019-12-20 NOTE — Progress Notes (Addendum)
Patient: Ricky Mercer  Service Category: E/M  Provider: Gaspar Cola, MD  DOB: 1951-05-26  DOS: 12/20/2019  Location: Office  MRN: [id number]  Setting: Ambulatory outpatient  Referring Provider: Sofie Hartigan, MD  Type: Established Patient  Specialty: Interventional Pain Management  PCP: Sofie Hartigan, MD  Location: Remote location  Delivery: TeleHealth     Virtual Encounter - Pain Management PROVIDER NOTE: Information contained herein reflects review and annotations entered in association with encounter. Interpretation of such information and data should be left to medically-trained personnel. Information provided to patient can be located elsewhere in the medical record under "Patient Instructions". Document created using STT-dictation technology, any transcriptional errors that may result from process are unintentional.    Contact & Pharmacy Preferred: [phone] Home: [phone] (home) Mobile: [phone] (mobile) E-mail: No e-mail address on record  [id number] [place] [place]Phone: [phone]: [phone]  Pre-screening  Mr. LBertinooffered "in-person" vs "virtual" encounter. He indicated preferring virtual for this encounter.   Reason COVID-19*  Social distancing based on CDC and AMA recommendations.   I contacted CDelane Gingeron 12/20/2019 via telephone.      I clearly identified myself as FGaspar Cola MD. I verified that I was speaking with the correct person using two identifiers (Name: CTIERRE NETTO and date of birth: [birth year].  Consent I sought verbal advanced consent from CDelane Gingerfor virtual visit interactions. I informed Mr. LChappleof possible security and privacy concerns, risks, and limitations associated with providing "not-in-person" medical evaluation and management services. I also informed Mr. LWittmeyerof the availability of "in-person" appointments.  Finally, I informed him that there would be a charge for the virtual visit and that he could be  personally, fully or partially, financially responsible for it. Mr. LKaufholdexpressed understanding and agreed to proceed.   Historic Elements   Mr. CWILBON OBENCHAINis a 69y.o. year old, male patient evaluated today after his last contact with our practice on 12/05/2019. Mr. LFigley has a past medical history of Acute postoperative pain (03/21/2017), Bilateral arm pain, Cardiomyopathy ([place], Diverticulosis, Dupuytren's contracture, Erosive gastritis, Foot pain, bilateral, GERD (gastroesophageal reflux disease), Hand weakness, Hyperlipidemia, Knee pain, Midline low back pain, Osteoarthritis, Pain in neck, [place], Reactive airway disease, Seasonal allergies, Situational anxiety, and Sleep apnea. He also  has a past surgical history that includes Nose surgery; Hand surgery (Bilateral); Colonoscopy (10/24/2012); Upper gi endoscopy (12/18/2012); Tonsillectomy (2009); Skin cancer excision (Left, 2015); Mass excision (Right, 07/18/2015); Esophagogastroduodenoscopy (egd) with propofol (N/A, 10/28/2015); prostate seeding; Hernia repair; Cholecystectomy (N/A, 12/02/2015); Esophagogastroduodenoscopy (N/A, 11/17/2017); and Colonoscopy with propofol (N/A, 11/17/2017). Mr. LSouthhas a current medication list which includes the following prescription(s): alprazolam, aspirin, cholecalciferol, [START ON 08/20/2020] gabapentin, oxycodone, [START ON 01/19/2020] oxycodone, [START ON 02/18/2020] oxycodone, pantoprazole, and simvastatin. He  reports that he has never smoked. He has never used smokeless tobacco. He reports that he does not drink alcohol or use drugs. Mr. LKuenzihas No Known Allergies.   HPI  Today, he is being contacted for a post-procedure assessment.  Today we went over the results of his recent cervical MRI (12/07/2019) where it indicates that there has not been any significant changes since that 2016 cervical  MRI.  He still has degenerative disc disease with left foraminal narrowing at C2-3 and C3-4 level with left-sided facet arthropathy at multiple levels in bilateral facet arthropathy  at the C4-5 and C5-6 levels.  Based on these results, the patient's history, and be physical exam, it is my impression that we may be dealing with a cervical facet syndrome and that this may be the reason why he did not get any significant long-term benefit with the epidural steroid injection.  At this point, we have decided to proceed with a diagnostic left-sided cervical epidural steroid injection under fluoroscopic guidance and IV sedation to determine if this is where the pain is coming from.  If it is, he may be a good candidate for radiofrequency ablation.  Post-Procedure Evaluation  Procedure (12/04/2019): Therapeutic left cervical ESI #3 under fluoroscopic guidance, no sedation Pre-procedure pain level:  6/10 Post-procedure: 3/10 Limited initial benefit, possibly due to rapid discharge after no sedation procedure, without enough time to allow full onset of block.  Sedation: None.  Hart Rochester, RN  12/20/2019  2:36 PM  Signed Pain relief after procedure (treated area only): (Questions asked to patient) 1. Starting about 15 minutes after the procedure, and "while the area was still numb" (from the local anesthetics), were you having any of your usual pain "in that area" (the treated area)?  (NOTE: NOT including the discomfort from the needle sticks.) First 1 hour: 50 % better. First 4-6 hours: 50 % better. 2. How long did the numbness from the local anesthetics last? (More than 6 hours?) Duration:   3. How much better is your pain now, when compared to before the procedure? Current benefit: 50 % (x 2 days) better. 4. Can you move better now? Improvement in ROM (Range of Motion): No. 5. Can you do more now? Improvement in function: No. 4. Did you have any problems with the  procedure? Side-effects/Complications: No.  Current benefits: Defined as benefit that persist at this time.   Analgesia:  Back to baseline Function: Back to baseline ROM: Back to baseline  Pharmacotherapy Assessment  Analgesic: Oxycodone 5 mg, 1 tab PO q 8 hrs (15 mg/day of oxycodone) MME/day: 22.5 mg/day.   Monitoring: [place] PMP: PDMP reviewed during this encounter.       Pharmacotherapy: No side-effects or adverse reactions reported. Compliance: No problems identified. Effectiveness: Clinically acceptable. Plan: Refer to "POC".  UDS:  Summary  Date Value Ref Range Status  08/06/2019 Note  Final    Comment:    ==================================================================== [employer] (MW) ==================================================================== Test                             Result       Flag       Units Drug Present   Oxycodone                      886                     ng/mg creat   Oxymorphone                    886                     ng/mg creat   Noroxycodone                   1251                    ng/mg creat   Noroxymorphone  361                     ng/mg creat    Sources of oxycodone are scheduled prescription medications.    Oxymorphone, noroxycodone, and noroxymorphone are expected    metabolites of oxycodone. Oxymorphone is also available as a    scheduled prescription medication. ==================================================================== Test                      Result    Flag   Units      Ref Range   Creatinine              51               mg/dL      >=20 ==================================================================== Declared Medications:  Medication list was not provided. ==================================================================== For clinical consultation, please call [phone]. ====================================================================    Laboratory Chemistry Profile    Renal Lab Results  Component Value Date   BUN 11 08/06/2019   CREATININE 0.93 08/06/2019   BCR 12 08/06/2019   GFRAA 99 08/06/2019   GFRNONAA 86 08/06/2019     Hepatic Lab Results  Component Value Date   AST 23 08/06/2019   ALT 41 11/25/2015   ALBUMIN 4.3 08/06/2019   ALKPHOS 72 08/06/2019     Electrolytes Lab Results  Component Value Date   NA 142 08/06/2019   K 4.3 08/06/2019   CL 104 08/06/2019   CALCIUM 9.2 08/06/2019   MG 2.0 08/06/2019     Bone Lab Results  Component Value Date   25OHVITD1 49 08/06/2019   25OHVITD2 <1.0 08/06/2019   25OHVITD3 49 08/06/2019     Inflammation (CRP: Acute Phase) (ESR: Chronic Phase) Lab Results  Component Value Date   CRP <1 08/06/2019   ESRSEDRATE 2 08/06/2019       Note: Above Lab results reviewed.  Imaging  MR CERVICAL SPINE WO CONTRAST CLINICAL DATA:  Chronic neck pain and radiculitis.  EXAM: MRI CERVICAL SPINE WITHOUT CONTRAST  TECHNIQUE: Multiplanar, multisequence MR imaging of the cervical spine was performed. No intravenous contrast was administered.  COMPARISON:  MRI cervical spine 10/11/2014  FINDINGS: Alignment: Slight anterolisthesis C3-4 unchanged. Remaining alignment normal.  Vertebrae: Negative for fracture or mass  Cord: Normal signal and morphology  Posterior Fossa, vertebral arteries, paraspinal tissues: Negative  Disc levels:  C2-3: Mild left foraminal narrowing.  Mild left facet degeneration.  C3-4: Mild disc degeneration and uncinate spurring. Left-sided facet degeneration. Mild left foraminal narrowing.  C4-5: Bilateral facet degeneration. No significant spinal or foraminal stenosis  C5-6: Mild facet degeneration bilaterally.  Negative for stenosis  C6-7: Negative  C7-T1: Negative  IMPRESSION: Mild cervical spine degenerative changes with mild left foraminal narrowing C2-3 and C3-4. Overall no change from 2016.  Electronically Signed   By: Franchot Gallo M.D.   On:  12/07/2019 08:42  Assessment  The primary encounter diagnosis was Cervicalgia. Diagnoses of Cervical facet syndrome (R>L), Cervical facet hypertrophy, Cervico-occipital neuralgia, Cervicogenic headache, and Abnormal MRI, cervical spine (12/07/2019) were also pertinent to this visit.  Plan of Care  Problem-specific:  No problem-specific Assessment & Plan notes found for this encounter.  Mr. JULEON NARANG has a current medication list which includes the following long-term medication(s): cholecalciferol, [START ON 08/20/2020] gabapentin, oxycodone, [START ON 01/19/2020] oxycodone, [START ON 02/18/2020] oxycodone, pantoprazole, and simvastatin.  Pharmacotherapy (Medications Ordered): No orders of the defined types were placed in this encounter.  Orders:  Orders Placed  This Encounter  Procedures  . CERVICAL FACET (MEDIAL BRANCH NERVE BLOCK)     Standing Status:   Future    Standing Expiration Date:   01/19/2020    Scheduling Instructions:     Side: Left-sided     Level: C3-4, C4-5, C5-6 Facet joints (C3, C4, C5, C6, & C7 Medial Branch Nerves)     Sedation: With Sedation.     Timeframe: As soon as schedule allows    Order Specific Question:   Where will this procedure be performed?    Answer:   [facility]   Follow-up plan:   Return for Procedure (w/ sedation): (L) C-FCT BLK #1.      Considering:   Therapeutic right lumbar facet RFA #1  Therapeutic right SI joint RFA #1  Diagnostic bilateral Hipjoint injection  Diagnostic bilateral cervical facet block  Diagnostic Caudal ESI + diagnostic epidurogram  Diagnostic right Knee injection  Diagnostic right Genicular NB  Diagnostic bilateral IA shoulderinjection  Diagnostic bilateral suprascapular NB    Palliative PRN treatment(s):   Palliative right lumbar facet block #4  Palliative left lumbar facet block #4  Palliative right SI joint block #3  Palliative left SI joint block #3  Palliative left lumbar facet RFA (last done on  03/21/2017)  Palliative left SI joint RFA (last done on 03/21/2017)  Therapeutic/palliative left CESI #4      Recent Visits Date Type Provider Dept  12/04/19 Procedure visit Milinda Pointer, MD [facility]  11/26/19 Telemedicine Milinda Pointer, MD [facility]  Showing recent visits within past 90 days and meeting all other requirements   Today's Visits Date Type Provider Dept  12/20/19 Telemedicine Milinda Pointer, MD [facility]  Showing today's visits and meeting all other requirements   Future Appointments Date Type Provider Dept  03/19/20 Appointment Milinda Pointer, MD [facility]  Showing future appointments within next 90 days and meeting all other requirements   I discussed the assessment and treatment plan with the patient. The patient was provided an opportunity to ask questions and all were answered. The patient agreed with the plan and demonstrated an understanding of the instructions.  Patient advised to call back or seek an in-person evaluation if the symptoms or condition worsens.  Duration of encounter: 15 minutes.  Note by: Gaspar Cola, MD Date: 12/20/2019; Time: 2:53 PM

## 2019-12-20 NOTE — Addendum Note (Signed)
Addended by: Milinda Pointer A on: 12/20/2019 02:53 PM   Modules accepted: Orders, Level of Service

## 2019-12-20 NOTE — Patient Instructions (Signed)
____________________________________________________________________________________________  Preparing for Procedure with Sedation  Procedure appointments are limited to planned procedures: . No Prescription Refills. . No disability issues will be discussed. . No medication changes will be discussed.  Instructions: . Oral Intake: Do not eat or drink anything for at least 3 hours prior to your procedure. (Exception: Blood Pressure Medication. See below.) . Transportation: Unless otherwise stated by your physician, you may drive yourself after the procedure. . Blood Pressure Medicine: Do not forget to take your blood pressure medicine with a sip of water the morning of the procedure. If your Diastolic (lower reading)is above 100 mmHg, elective cases will be cancelled/rescheduled. . Blood thinners: These will need to be stopped for procedures. Notify our staff if you are taking any blood thinners. Depending on which one you take, there will be specific instructions on how and when to stop it. . Diabetics on insulin: Notify the staff so that you can be scheduled 1st case in the morning. If your diabetes requires high dose insulin, take only  of your normal insulin dose the morning of the procedure and notify the staff that you have done so. . Preventing infections: Shower with an antibacterial soap the morning of your procedure. . Build-up your immune system: Take 1000 mg of Vitamin C with every meal (3 times a day) the day prior to your procedure. . Antibiotics: Inform the staff if you have a condition or reason that requires you to take antibiotics before dental procedures. . Pregnancy: If you are pregnant, call and cancel the procedure. . Sickness: If you have a cold, fever, or any active infections, call and cancel the procedure. . Arrival: You must be in the facility at least 30 minutes prior to your scheduled procedure. . Children: Do not bring children with you. . Dress appropriately:  Bring dark clothing that you would not mind if they get stained. . Valuables: Do not bring any jewelry or valuables.  Reasons to call and reschedule or cancel your procedure: (Following these recommendations will minimize the risk of a serious complication.) . Surgeries: Avoid having procedures within 2 weeks of any surgery. (Avoid for 2 weeks before or after any surgery). . Flu Shots: Avoid having procedures within 2 weeks of a flu shots or . (Avoid for 2 weeks before or after immunizations). . Barium: Avoid having a procedure within 7-10 days after having had a radiological study involving the use of radiological contrast. (Myelograms, Barium swallow or enema study). . Heart attacks: Avoid any elective procedures or surgeries for the initial 6 months after a "Myocardial Infarction" (Heart Attack). . Blood thinners: It is imperative that you stop these medications before procedures. Let us know if you if you take any blood thinner.  . Infection: Avoid procedures during or within two weeks of an infection (including chest colds or gastrointestinal problems). Symptoms associated with infections include: Localized redness, fever, chills, night sweats or profuse sweating, burning sensation when voiding, cough, congestion, stuffiness, runny nose, sore throat, diarrhea, nausea, vomiting, cold or Flu symptoms, recent or current infections. It is specially important if the infection is over the area that we intend to treat. . Heart and lung problems: Symptoms that may suggest an active cardiopulmonary problem include: cough, chest pain, breathing difficulties or shortness of breath, dizziness, ankle swelling, uncontrolled high or unusually low blood pressure, and/or palpitations. If you are experiencing any of these symptoms, cancel your procedure and contact your primary care physician for an evaluation.  Remember:  Regular Business hours are:    Monday to Thursday 8:00 AM to 4:00 PM  Provider's  Schedule: Everet Flagg, MD:  Procedure days: Tuesday and Thursday 7:30 AM to 4:00 PM  Bilal Lateef, MD:  Procedure days: Monday and Wednesday 7:30 AM to 4:00 PM ____________________________________________________________________________________________    

## 2019-12-26 ENCOUNTER — Telehealth: Payer: Medicare Other | Admitting: Pain Medicine

## 2020-01-14 ENCOUNTER — Other Ambulatory Visit: Payer: Self-pay | Admitting: Otolaryngology

## 2020-01-14 DIAGNOSIS — R1310 Dysphagia, unspecified: Secondary | ICD-10-CM

## 2020-01-17 ENCOUNTER — Other Ambulatory Visit: Payer: Self-pay | Admitting: Otolaryngology

## 2020-01-17 ENCOUNTER — Ambulatory Visit
Admission: RE | Admit: 2020-01-17 | Discharge: 2020-01-17 | Disposition: A | Discharge status: Home / Self Care | Payer: Medicare Other | Source: Ambulatory Visit | Attending: Otolaryngology | Admitting: Otolaryngology

## 2020-01-17 ENCOUNTER — Other Ambulatory Visit: Payer: Self-pay

## 2020-01-17 DIAGNOSIS — R1310 Dysphagia, unspecified: Secondary | ICD-10-CM | POA: Diagnosis present

## 2020-01-22 ENCOUNTER — Ambulatory Visit: Payer: Medicare Other | Admitting: Pain Medicine

## 2020-01-31 ENCOUNTER — Encounter: Payer: Self-pay | Admitting: Pain Medicine

## 2020-01-31 ENCOUNTER — Ambulatory Visit
Admission: RE | Admit: 2020-01-31 | Discharge: 2020-01-31 | Disposition: A | Discharge status: Home / Self Care | Payer: Medicare Other | Source: Ambulatory Visit | Attending: Pain Medicine | Admitting: Pain Medicine

## 2020-01-31 ENCOUNTER — Ambulatory Visit (HOSPITAL_BASED_OUTPATIENT_CLINIC_OR_DEPARTMENT_OTHER): Payer: Medicare Other | Admitting: Pain Medicine

## 2020-01-31 ENCOUNTER — Other Ambulatory Visit: Payer: Self-pay

## 2020-01-31 VITALS — BP 135/85 | HR 62 | Temp 98.0°F | Resp 16 | Ht 69.0 in | Wt 165.0 lb

## 2020-01-31 DIAGNOSIS — M542 Cervicalgia: Secondary | ICD-10-CM | POA: Insufficient documentation

## 2020-01-31 DIAGNOSIS — M5382 Other specified dorsopathies, cervical region: Secondary | ICD-10-CM | POA: Diagnosis not present

## 2020-01-31 DIAGNOSIS — M5030 Other cervical disc degeneration, unspecified cervical region: Secondary | ICD-10-CM

## 2020-01-31 DIAGNOSIS — M47812 Spondylosis without myelopathy or radiculopathy, cervical region: Secondary | ICD-10-CM | POA: Diagnosis present

## 2020-01-31 DIAGNOSIS — G4486 Cervicogenic headache: Secondary | ICD-10-CM

## 2020-01-31 DIAGNOSIS — M8938 Hypertrophy of bone, other site: Secondary | ICD-10-CM | POA: Diagnosis not present

## 2020-01-31 DIAGNOSIS — R519 Headache, unspecified: Secondary | ICD-10-CM | POA: Insufficient documentation

## 2020-01-31 DIAGNOSIS — Z7982 Long term (current) use of aspirin: Secondary | ICD-10-CM | POA: Insufficient documentation

## 2020-01-31 DIAGNOSIS — Z79899 Other long term (current) drug therapy: Secondary | ICD-10-CM | POA: Diagnosis not present

## 2020-01-31 MED ORDER — FENTANYL CITRATE (PF) 100 MCG/2ML IJ SOLN
INTRAMUSCULAR | Status: AC
  Filled 2020-01-31: qty 2

## 2020-01-31 MED ORDER — MIDAZOLAM HCL 5 MG/5ML IJ SOLN
1.0000 mg | INTRAMUSCULAR | Status: DC | PRN
  Administered 2020-01-31: 2 mg via INTRAVENOUS

## 2020-01-31 MED ORDER — DEXAMETHASONE SODIUM PHOSPHATE 10 MG/ML IJ SOLN
10.0000 mg | Freq: Once | INTRAMUSCULAR | Status: AC
  Administered 2020-01-31: 10 mg

## 2020-01-31 MED ORDER — ROPIVACAINE HCL 2 MG/ML IJ SOLN
INTRAMUSCULAR | Status: AC
  Filled 2020-01-31: qty 10

## 2020-01-31 MED ORDER — LIDOCAINE HCL 2 % IJ SOLN
INTRAMUSCULAR | Status: AC
  Filled 2020-01-31: qty 20

## 2020-01-31 MED ORDER — FENTANYL CITRATE (PF) 100 MCG/2ML IJ SOLN
25.0000 ug | INTRAMUSCULAR | Status: DC | PRN
  Administered 2020-01-31: 50 ug via INTRAVENOUS

## 2020-01-31 MED ORDER — LIDOCAINE HCL 2 % IJ SOLN
20.0000 mL | Freq: Once | INTRAMUSCULAR | Status: AC
  Administered 2020-01-31: 400 mg

## 2020-01-31 MED ORDER — ROPIVACAINE HCL 2 MG/ML IJ SOLN
9.0000 mL | Freq: Once | INTRAMUSCULAR | Status: AC
  Administered 2020-01-31: 9 mL via PERINEURAL

## 2020-01-31 MED ORDER — LACTATED RINGERS IV SOLN
1000.0000 mL | Freq: Once | INTRAVENOUS | Status: AC
  Administered 2020-01-31: 1000 mL via INTRAVENOUS

## 2020-01-31 MED ORDER — MIDAZOLAM HCL 5 MG/5ML IJ SOLN
INTRAMUSCULAR | Status: AC
  Filled 2020-01-31: qty 5

## 2020-01-31 MED ORDER — DEXAMETHASONE SODIUM PHOSPHATE 10 MG/ML IJ SOLN
INTRAMUSCULAR | Status: AC
  Filled 2020-01-31: qty 1

## 2020-01-31 NOTE — Patient Instructions (Signed)

## 2020-01-31 NOTE — Progress Notes (Signed)
PROVIDER NOTE: Information contained herein reflects review and annotations entered in association with encounter. Interpretation of such information and data should be left to medically-trained personnel. Information provided to patient can be located elsewhere in the medical record under "Patient Instructions". Document created using STT-dictation technology, any transcriptional errors that may result from process are unintentional.    Patient: Ricky Mercer  Service Category: Procedure  Provider: Gaspar Cola, MD  DOB: 19-Jan-1951  DOS: 01/31/2020  Location: [facility]  MRN: [id number]  Setting: Ambulatory - outpatient  Referring Provider: Sofie Hartigan, MD  Type: Established Patient  Specialty: Interventional Pain Management  PCP: Sofie Hartigan, MD   Primary Reason for Visit: Interventional Pain Management Treatment. CC: Neck Pain and Back Pain  Procedure:          Anesthesia, Analgesia, Anxiolysis:  Type: Cervical Facet Medial Branch Block(s)  #1  Primary Purpose: Diagnostic Region: Posterolateral cervical spine Level: C3, C4, C5, C6, & C7 Medial Branch Level(s). Injecting these levels blocks the C3-4, C4-5, C5-6, and C6-7 cervical facet joints. Laterality: Left  Type: Moderate (Conscious) Sedation combined with Local Anesthesia Indication(s): Analgesia and Anxiety Route: Intravenous (IV) IV Access: Secured Sedation: Meaningful verbal contact was maintained at all times during the procedure  Local Anesthetic: Lidocaine 1-2%  Position: Prone with head of the table raised to facilitate breathing.   Indications: 1. Cervicalgia   2. Cervical facet syndrome (R>L)   3. Cervical facet hypertrophy   4. DDD (degenerative disc disease), cervical   5. Cervicogenic headache   6. Cervical spondylosis    Pain Score: Pre-procedure: 5 /10 Post-procedure: 3 /10   Pre-op Assessment:  Ricky Mercer is a 69 y.o. (year old), male patient, seen today for  interventional treatment. He  has a past surgical history that includes Nose surgery; Hand surgery (Bilateral); Colonoscopy (10/24/2012); Upper gi endoscopy (12/18/2012); Tonsillectomy (2009); Skin cancer excision (Left, 2015); Mass excision (Right, 07/18/2015); Esophagogastroduodenoscopy (egd) with propofol (N/A, 10/28/2015); prostate seeding; Hernia repair; Cholecystectomy (N/A, 12/02/2015); Esophagogastroduodenoscopy (N/A, 11/17/2017); and Colonoscopy with propofol (N/A, 11/17/2017). Ricky Mercer has a current medication list which includes the following prescription(s): aspirin, cholecalciferol, [START ON 08/20/2020] gabapentin, oxycodone, [START ON 02/18/2020] oxycodone, pantoprazole, simvastatin, alprazolam, and oxycodone, and the following Facility-Administered Medications: fentanyl and midazolam. His primarily concern today is the Neck Pain and Back Pain  Initial Vital Signs:  Pulse/HCG Rate: 60ECG Heart Rate: 62 Temp: 98.3 F (36.8 C) Resp: 18 BP: 123/78 SpO2: 100 %  BMI: Estimated body mass index is 24.37 kg/m as calculated from the following:   Height as of this encounter: 5\' 9"  (1.753 m).   Weight as of this encounter: 165 lb (74.8 kg).  Risk Assessment: Allergies: Reviewed. He has No Known Allergies.  Allergy Precautions: None required Coagulopathies: Reviewed. None identified.  Blood-thinner therapy: None at this time Active Infection(s): Reviewed. None identified. Ricky Mercer is afebrile  Site Confirmation: Ricky Mercer was asked to confirm the procedure and laterality before marking the site Procedure checklist: Completed Consent: Before the procedure and under the influence of no sedative(s), amnesic(s), or anxiolytics, the patient was informed of the treatment options, risks and possible complications. To fulfill our ethical and legal obligations, as recommended by the American Medical Association's Code of Ethics, I have informed the patient of my clinical impression; the nature and  purpose of the treatment or procedure; the risks, benefits, and possible complications of the intervention; the alternatives, including doing nothing; the risk(s) and benefit(s) of the alternative  treatment(s) or procedure(s); and the risk(s) and benefit(s) of doing nothing. The patient was provided information about the general risks and possible complications associated with the procedure. These may include, but are not limited to: failure to achieve desired goals, infection, bleeding, organ or nerve damage, allergic reactions, paralysis, and death. In addition, the patient was informed of those risks and complications associated to Spine-related procedures, such as failure to decrease pain; infection (i.e.: Meningitis, epidural or intraspinal abscess); bleeding (i.e.: epidural hematoma, subarachnoid hemorrhage, or any other type of intraspinal or peri-dural bleeding); organ or nerve damage (i.e.: Any type of peripheral nerve, nerve root, or spinal cord injury) with subsequent damage to sensory, motor, and/or autonomic systems, resulting in permanent pain, numbness, and/or weakness of one or several areas of the body; allergic reactions; (i.e.: anaphylactic reaction); and/or death. Furthermore, the patient was informed of those risks and complications associated with the medications. These include, but are not limited to: allergic reactions (i.e.: anaphylactic or anaphylactoid reaction(s)); adrenal axis suppression; blood sugar elevation that in diabetics may result in ketoacidosis or comma; water retention that in patients with history of congestive heart failure may result in shortness of breath, pulmonary edema, and decompensation with resultant heart failure; weight gain; swelling or edema; medication-induced neural toxicity; particulate matter embolism and blood vessel occlusion with resultant organ, and/or nervous system infarction; and/or aseptic necrosis of one or more joints. Finally, the patient was  informed that Medicine is not an exact science; therefore, there is also the possibility of unforeseen or unpredictable risks and/or possible complications that may result in a catastrophic outcome. The patient indicated having understood very clearly. We have given the patient no guarantees and we have made no promises. Enough time was given to the patient to ask questions, all of which were answered to the patient's satisfaction. Mr. Koglin has indicated that he wanted to continue with the procedure. Attestation: I, the ordering provider, attest that I have discussed with the patient the benefits, risks, side-effects, alternatives, likelihood of achieving goals, and potential problems during recovery for the procedure that I have provided informed consent. Date  Time: 01/31/2020  8:21 AM  Pre-Procedure Preparation:  Monitoring: As per clinic protocol. Respiration, ETCO2, SpO2, BP, heart rate and rhythm monitor placed and checked for adequate function Safety Precautions: Patient was assessed for positional comfort and pressure points before starting the procedure. Time-out: I initiated and conducted the "Time-out" before starting the procedure, as per protocol. The patient was asked to participate by confirming the accuracy of the "Time Out" information. Verification of the correct person, site, and procedure were performed and confirmed by me, the nursing staff, and the patient. "Time-out" conducted as per Joint Commission's Universal Protocol (UP.01.01.01). Time: 0901  Description of Procedure:          Laterality: Left Level: C3, C4, C5, C6, & C7 Medial Branch Level(s). Area Prepped: Posterior Cervico-thoracic Region DuraPrep (Iodine Povacrylex [0.7% available iodine] and Isopropyl Alcohol, 74% w/w) Safety Precautions: Aspiration looking for blood return was conducted prior to all injections. At no point did we inject any substances, as a needle was being advanced. Before injecting, the patient  was told to immediately notify me if he was experiencing any new onset of "ringing in the ears, or metallic taste in the mouth". No attempts were made at seeking any paresthesias. Safe injection practices and needle disposal techniques used. Medications properly checked for expiration dates. SDV (single dose vial) medications used. After the completion of the procedure, all disposable equipment  used was discarded in the proper designated medical waste containers. Local Anesthesia: Protocol guidelines were followed. The patient was positioned over the fluoroscopy table. The area was prepped in the usual manner. The time-out was completed. The target area was identified using fluoroscopy. A 12-in long, straight, sterile hemostat was used with fluoroscopic guidance to locate the targets for each level blocked. Once located, the skin was marked with an approved surgical skin marker. Once all sites were marked, the skin (epidermis, dermis, and hypodermis), as well as deeper tissues (fat, connective tissue and muscle) were infiltrated with a small amount of a short-acting local anesthetic, loaded on a 10cc syringe with a 25G, 1.5-in  Needle. An appropriate amount of time was allowed for local anesthetics to take effect before proceeding to the next step. Local Anesthetic: Lidocaine 2.0% The unused portion of the local anesthetic was discarded in the proper designated containers. Technical explanation of process:  C3 Medial Branch Nerve Block (MBB): The target area for the C3 dorsal medial articular branch is the lateral concave waist of the articular pillar of C3. Under fluoroscopic guidance, a Quincke needle was inserted until contact was made with os over the postero-lateral aspect of the articular pillar of C3 (target area). After negative aspiration for blood, 0.5 mL of the nerve block solution was injected without difficulty or complication. The needle was removed intact. C4 Medial Branch Nerve Block (MBB): The  target area for the C4 dorsal medial articular branch is the lateral concave waist of the articular pillar of C4. Under fluoroscopic guidance, a Quincke needle was inserted until contact was made with os over the postero-lateral aspect of the articular pillar of C4 (target area). After negative aspiration for blood, 0.5 mL of the nerve block solution was injected without difficulty or complication. The needle was removed intact. C5 Medial Branch Nerve Block (MBB): The target area for the C5 dorsal medial articular branch is the lateral concave waist of the articular pillar of C5. Under fluoroscopic guidance, a Quincke needle was inserted until contact was made with os over the postero-lateral aspect of the articular pillar of C5 (target area). After negative aspiration for blood, 0.5 mL of the nerve block solution was injected without difficulty or complication. The needle was removed intact. C6 Medial Branch Nerve Block (MBB): The target area for the C6 dorsal medial articular branch is the lateral concave waist of the articular pillar of C6. Under fluoroscopic guidance, a Quincke needle was inserted until contact was made with os over the postero-lateral aspect of the articular pillar of C6 (target area). After negative aspiration for blood, 0.5 mL of the nerve block solution was injected without difficulty or complication. The needle was removed intact. C7 Medial Branch Nerve Block (MBB): The target for the C7 dorsal medial articular branch lies on the superior-medial tip of the C7 transverse process. Under fluoroscopic guidance, a Quincke needle was inserted until contact was made with os over the postero-lateral aspect of the articular pillar of C7 (target area). After negative aspiration for blood, 0.5 mL of the nerve block solution was injected without difficulty or complication. The needle was removed intact. Procedural Needles: 22-gauge, 3.5-inch, Quincke needles used for all levels. Nerve block  solution: 0.2% PF-Ropivacaine + Triamcinolone (40 mg/mL) diluted to a final concentration of 4 mg of Triamcinolone/mL of Ropivacaine The unused portion of the solution was discarded in the proper designated containers.  Once the entire procedure was completed, the treated area was cleaned, making sure to leave some  of the prepping solution back to take advantage of its long term bactericidal properties.  Anatomy Reference Guide:       Vitals:   01/31/20 0910 01/31/20 0916 01/31/20 0926 01/31/20 0936  BP: 113/82 118/88 135/85   Pulse: 62     Resp: 12 14 16 16   Temp:  97.8 F (36.6 C)  98 F (36.7 C)  SpO2: 98% 99% 98% 97%  Weight:      Height:        Start Time: 0901 hrs. End Time: 0908 hrs.  Imaging Guidance (Spinal):          Type of Imaging Technique: Fluoroscopy Guidance (Spinal) Indication(s): Assistance in needle guidance and placement for procedures requiring needle placement in or near specific anatomical locations not easily accessible without such assistance. Exposure Time: Please see nurses notes. Contrast: None used. Fluoroscopic Guidance: I was personally present during the use of fluoroscopy. "Tunnel Vision Technique" used to obtain the best possible view of the target area. Parallax error corrected before commencing the procedure. "Direction-depth-direction" technique used to introduce the needle under continuous pulsed fluoroscopy. Once target was reached, antero-posterior, oblique, and lateral fluoroscopic projection used confirm needle placement in all planes. Images permanently stored in EMR. Interpretation: No contrast injected. I personally interpreted the imaging intraoperatively. Adequate needle placement confirmed in multiple planes. Permanent images saved into the patient's record.  Antibiotic Prophylaxis:   Anti-infectives (From admission, onward)   None     Indication(s): None identified  Post-operative Assessment:  Post-procedure Vital Signs:    Pulse/HCG Rate: 62(!) 55 Temp: 98 F (36.7 C) Resp: 16 BP: 135/85 SpO2: 97 %  EBL: None  Complications: No immediate post-treatment complications observed by team, or reported by patient.  Note: The patient tolerated the entire procedure well. A repeat set of vitals were taken after the procedure and the patient was kept under observation following institutional policy, for this type of procedure. Post-procedural neurological assessment was performed, showing return to baseline, prior to discharge. The patient was provided with post-procedure discharge instructions, including a section on how to identify potential problems. Should any problems arise concerning this procedure, the patient was given instructions to immediately contact us, at any time, without hesitation. In any case, we plan to contact the patient by telephone for a follow-up status report regarding this interventional procedure.  Comments:  No additional relevant information.  Plan of Care  Orders:  Orders Placed This Encounter  Procedures  . CERVICAL FACET (MEDIAL BRANCH NERVE BLOCK)     Scheduling Instructions:     Side: Left-sided     Level: C3-4, C4-5, C5-6 Facet joints (C3, C4, C5, C6, & C7 Medial Branch Nerves)     Sedation: With Sedation.     Timeframe: Today    Order Specific Question:   Where will this procedure be performed?    Answer:   [facility]  . [facility] C-ARM 1-60 MIN NO REPORT    Intraoperative interpretation by procedural physician at [place].    Standing Status:   Standing    Number of Occurrences:   1    Order Specific Question:   Reason for exam:    Answer:   Assistance in needle guidance and placement for procedures requiring needle placement in or near specific anatomical locations not easily accessible without such assistance.  . Informed Consent Details: Physician/Practitioner Attestation; Transcribe to consent form and obtain patient signature    Provider  Attestation: I, Arp Dossie Arbour,  MD, (Pain Management Specialist), the physician/practitioner, attest that I have discussed with the patient the benefits, risks, side effects, alternatives, likelihood of achieving goals and potential problems during recovery for the procedure that I have provided informed consent.    Scheduling Instructions:     Procedure: Left Cervical facet block under fluoroscopic guidance.     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 Rhoderick, before procedure.     Transcribe to consent form and obtain patient signature.  . Provide equipment / supplies at bedside    Equipment required: Single use, disposable, "Block Tray"    Standing Status:   Standing    Number of Occurrences:   1    Order Specific Question:   Specify    Answer:   Block Tray   Chronic Opioid Analgesic:  Oxycodone 5 mg, 1 tab PO q 8 hrs (15 mg/day of oxycodone) MME/day: 22.5 mg/day.   Medications ordered for procedure: Meds ordered this encounter  Medications  . lidocaine (XYLOCAINE) 2 % (with pres) injection 400 mg  . lactated ringers infusion 1,000 mL  . midazolam (VERSED) 5 MG/5ML injection 1-2 mg    Make sure Flumazenil is available in the pyxis when using this medication. If oversedation occurs, administer 0.2 mg IV over 15 sec. If after 45 sec no response, administer 0.2 mg again over 1 min; may repeat at 1 min intervals; not to exceed 4 doses (1 mg)  . fentaNYL (SUBLIMAZE) injection 25-50 mcg    Make sure Narcan is available in the pyxis when using this medication. In the event of respiratory depression (RR< 8/min): Titrate NARCAN (naloxone) in increments of 0.1 to 0.2 mg IV at 2-3 minute intervals, until desired degree of reversal.  . ropivacaine (PF) 2 mg/mL (0.2%) (NAROPIN) injection 9 mL  . dexamethasone (DECADRON) injection 10 mg   Medications administered: We administered lidocaine, lactated ringers,  midazolam, fentaNYL, ropivacaine (PF) 2 mg/mL (0.2%), and dexamethasone.  See the medical record for exact dosing, route, and time of administration.  Follow-up plan:   Return in about 2 weeks (around 02/14/2020) for (VV), (PP).       Considering:   Therapeutic right lumbar facet RFA #1  Therapeutic right SI joint RFA #1  Diagnostic bilateral Hipjoint injection  Diagnostic bilateral cervical facet block  Diagnostic Caudal ESI + diagnostic epidurogram  Diagnostic right Knee injection  Diagnostic right Genicular NB  Diagnostic bilateral IA shoulderinjection  Diagnostic bilateral suprascapular NB    Palliative PRN treatment(s):   Palliative right lumbar facet block #4  Palliative left lumbar facet block #4  Palliative right SI joint block #3  Palliative left SI joint block #3  Palliative left lumbar facet RFA (last done on 03/21/2017)  Palliative left SI joint RFA (last done on 03/21/2017)  Therapeutic/palliative left CESI #4       Recent Visits Date Type Provider Dept  12/20/19 Telemedicine Milinda Pointer, MD [facility]  12/04/19 Procedure visit Milinda Pointer, [facility]  11/26/19 Telemedicine Milinda Pointer, MD [facility]  Showing recent visits within past 90 days and meeting all other requirements   Today's Visits Date Type Provider Dept  01/31/20 Procedure visit Milinda Pointer, MD [facility]  Showing today's visits and meeting all other requirements   Future Appointments Date Type Provider Dept  02/13/20 Appointment Milinda Pointer, [facility]  03/19/20 Appointment Milinda Pointer, MD [facility]  Showing future appointments within next 90 days and meeting all other requirements   Disposition: Discharge home  Discharge (Date  Time): 01/31/2020; 0936 hrs.   Primary Care Physician: Sofie Hartigan, MD Location: [facility] Note by: Gaspar Cola, MD Date: 01/31/2020; Time: 9:38 AM  Disclaimer:  Medicine is not an Chief Strategy Officer. The only guarantee in medicine is that nothing is guaranteed. It is important to note that the decision to proceed with this intervention was based on the information collected from the patient. The Data and conclusions were drawn from the patient's questionnaire, the interview, and the physical examination. Because the information was provided in large part by the patient, it cannot be guaranteed that it has not been purposely or unconsciously manipulated. Every effort has been made to obtain as much relevant data as possible for this evaluation. It is important to note that the conclusions that lead to this procedure are derived in large part from the available data. Always take into account that the treatment will also be dependent on availability of resources and existing treatment guidelines, considered by other Pain Management Practitioners as being common knowledge and practice, at the time of the intervention. For Medico-Legal purposes, it is also important to point out that variation in procedural techniques and pharmacological choices are the acceptable norm. The indications, contraindications, technique, and results of the above procedure should only be interpreted and judged by a Board-Certified Interventional Pain Specialist with extensive familiarity and expertise in the same exact procedure and technique.

## 2020-01-31 NOTE — Progress Notes (Signed)
Safety precautions to be maintained throughout the outpatient stay will include: orient to surroundings, keep bed in low position, maintain call bell within reach at all times, provide assistance with transfer out of bed and ambulation.  

## 2020-02-01 ENCOUNTER — Telehealth: Payer: Self-pay

## 2020-02-01 NOTE — Telephone Encounter (Signed)
Post procedure phone call.  Patient states he is doing ok.  

## 2020-02-13 ENCOUNTER — Other Ambulatory Visit: Payer: Self-pay | Admitting: Neurosurgery

## 2020-02-13 ENCOUNTER — Other Ambulatory Visit: Payer: Self-pay

## 2020-02-13 ENCOUNTER — Ambulatory Visit: Payer: Medicare Other | Attending: Pain Medicine | Admitting: Pain Medicine

## 2020-02-13 DIAGNOSIS — M542 Cervicalgia: Secondary | ICD-10-CM

## 2020-02-13 DIAGNOSIS — M47812 Spondylosis without myelopathy or radiculopathy, cervical region: Secondary | ICD-10-CM

## 2020-02-13 DIAGNOSIS — M79604 Pain in right leg: Secondary | ICD-10-CM

## 2020-02-13 DIAGNOSIS — G894 Chronic pain syndrome: Secondary | ICD-10-CM | POA: Diagnosis not present

## 2020-02-13 DIAGNOSIS — M5441 Lumbago with sciatica, right side: Secondary | ICD-10-CM

## 2020-02-13 DIAGNOSIS — G8929 Other chronic pain: Secondary | ICD-10-CM

## 2020-02-13 DIAGNOSIS — M5442 Lumbago with sciatica, left side: Secondary | ICD-10-CM | POA: Diagnosis not present

## 2020-02-13 DIAGNOSIS — M25559 Pain in unspecified hip: Secondary | ICD-10-CM | POA: Diagnosis not present

## 2020-02-13 DIAGNOSIS — M79605 Pain in left leg: Secondary | ICD-10-CM

## 2020-02-13 NOTE — Progress Notes (Signed)
Patient: Ricky Mercer  Service Category: E/M  Provider: Gaspar Cola, MD  DOB: 06-19-1951  DOS: 02/13/2020  Location: Office  MRN: [id number]  Setting: Ambulatory outpatient  Referring Provider: Sofie Hartigan, MD  Type: Established Patient  Specialty: Interventional Pain Management  PCP: Sofie Hartigan, MD  Location: Remote location  Delivery: TeleHealth     Virtual Encounter - Pain Management PROVIDER NOTE: Information contained herein reflects review and annotations entered in association with encounter. Interpretation of such information and data should be left to medically-trained personnel. Information provided to patient can be located elsewhere in the medical record under "Patient Instructions". Document created using STT-dictation technology, any transcriptional errors that may result from process are unintentional.    Contact & Pharmacy Preferred: [phone] Home: [phone] (home) Mobile: [phone] (mobile) E-mail: No e-mail address on record  [id number] [id number]: [phone]: [phone]  Pre-screening  Ricky Mercer "in-person" vs "virtual" encounter. He indicated preferring virtual for this encounter.   Reason COVID-19*  Social distancing based on CDC and AMA recommendations.   I contacted Ricky Gingeron 02/13/2020 via telephone.      I clearly identified myself as FGaspar Cola MD. I verified that I was speaking with the correct person using two identifiers (Name: Ricky Mercer and date of birth: [birth year].  Consent I sought verbal advanced consent from Ricky Mercer virtual visit interactions. I informed [name] possible security and privacy concerns, risks, and limitations associated with providing "not-in-person" medical evaluation and management services. I also informed [name] the availability of "in-person" appointments.  Finally, I informed him that there would be a charge for the virtual visit and that he could be  personally, fully or partially, financially responsible for it. Ricky Mercer understanding and agreed to proceed.   Historic Elements   Mr. CYOSHITO GAZAis a 69y.o. year old, male patient evaluated today after his last contact with our practice on 02/01/2020. Ricky Mercer has a past medical history of Acute postoperative pain (03/21/2017), Bilateral arm pain, Cardiomyopathy ([place], Diverticulosis, Dupuytren's contracture, Erosive gastritis, Foot pain, bilateral, GERD (gastroesophageal reflux disease), Hand weakness, Hyperlipidemia, Knee pain, Midline low back pain, Osteoarthritis, Pain in neck, [place], Reactive airway disease, Seasonal allergies, Situational anxiety, and Sleep apnea. He also  has a past surgical history that includes Nose surgery; Hand surgery (Bilateral); Colonoscopy (10/24/2012); Upper gi endoscopy (12/18/2012); Tonsillectomy (2009); Skin cancer excision (Left, 2015); Mass excision (Right, 07/18/2015); Esophagogastroduodenoscopy (egd) with propofol (N/A, 10/28/2015); prostate seeding; Hernia repair; Cholecystectomy (N/A, 12/02/2015); Esophagogastroduodenoscopy (N/A, 11/17/2017); and Colonoscopy with propofol (N/A, 11/17/2017). Mr. LWerlinghas a current medication list which includes the following prescription(s): aspirin, cholecalciferol, [START ON 08/20/2020] gabapentin, oxycodone, [START ON 02/18/2020] oxycodone, pantoprazole, simvastatin, and oxycodone. He  reports that he has never smoked. He has never used smokeless tobacco. He reports that he does not drink alcohol or use drugs. Mr. LBoomhowerhas No Known Allergies.   HPI  Today, he is being contacted for a post-procedure assessment.  According to the patient he attained 100% relief of the pain from the time that we did the injection and introduced local anesthetics until approximately 10:00 at night when it started coming  back.  Unfortunately he did not get any long-term benefit from the steroids which would indicate that his problem is being sustained primarily by a mechanical process  as opposed to an inflammatory one.  In view of this, the next course of action would be to consider radiofrequency ablation since he did get such good relief with the local anesthetic.  He did have a radiofrequency ablation on the left lumbar facets and left SI joint injection around 2018.  This worked very well for him.  Today we talked about the alternative of doing the cervical facets in a similar manner and he has agreed to proceed with it.  Post-Procedure Evaluation  Procedure: Diagnostic left-sided cervical facet block #1 under fluoroscopic guidance and IV sedation Pre-procedure pain level: 5/10 Post-procedure: 3/10 (< 50% relief)  Sedation: Sedation provided.  Effectiveness during initial hour after procedure(Ultra-Short Term Relief): 100 %.  Local anesthetic used: Long-acting (4-6 hours) Effectiveness: Defined as any analgesic benefit obtained secondary to the administration of local anesthetics. This carries significant diagnostic value as to the etiological location, or anatomical origin, of the pain. Duration of benefit is expected to coincide with the duration of the local anesthetic used.  Effectiveness during initial 4-6 hours after procedure(Short-Term Relief): 100 %.  Long-term benefit: Defined as any relief past the pharmacologic duration of the local anesthetics.  Effectiveness past the initial 6 hours after procedure(Long-Term Relief): 100 % (patient reports relief until approx 2200 that evening. pain returned in approx 1 day and became worse and worse.).  Current benefits: Defined as benefit that persist at this time.   Analgesia:  Back to baseline Function: Back to baseline ROM: Back to baseline  Pharmacotherapy Assessment  Analgesic: Oxycodone 5 mg, 1 tab PO q 8 hrs (15 mg/day of oxycodone) MME/day: 22.5  mg/day.   Monitoring: Cut Off PMP: PDMP reviewed during this encounter.       Pharmacotherapy: No side-effects or adverse reactions reported. Compliance: No problems identified. Effectiveness: Clinically acceptable. Plan: Refer to "POC".  UDS:  Summary  Date Value Ref Range Status  08/06/2019 Note  Final    Comment:    ==================================================================== [employer] Select 13 (MW) ==================================================================== Test                             Result       Flag       Units Drug Present   Oxycodone                      886                     ng/mg creat   Oxymorphone                    886                     ng/mg creat   Noroxycodone                   1251                    ng/mg creat   Noroxymorphone                 361                     ng/mg creat    Sources of oxycodone are scheduled prescription medications.    Oxymorphone, noroxycodone, and noroxymorphone are expected    metabolites of oxycodone. Oxymorphone is also available as a  scheduled prescription medication. ==================================================================== Test                      Result    Flag   Units      Ref Range   Creatinine              51               mg/dL      >=20 ==================================================================== Declared Medications:  Medication list was not provided. ==================================================================== For clinical consultation, please call [phone]. ====================================================================     Laboratory Chemistry Profile   Renal Lab Results  Component Value Date   BUN 11 08/06/2019   CREATININE 0.93 08/06/2019   BCR 12 08/06/2019   GFRAA 99 08/06/2019   GFRNONAA 86 08/06/2019     Hepatic Lab Results  Component Value Date   AST 23 08/06/2019   ALT 41 11/25/2015   ALBUMIN 4.3 08/06/2019   ALKPHOS 72  08/06/2019     Electrolytes Lab Results  Component Value Date   NA 142 08/06/2019   K 4.3 08/06/2019   CL 104 08/06/2019   CALCIUM 9.2 08/06/2019   MG 2.0 08/06/2019     Bone Lab Results  Component Value Date   25OHVITD1 49 08/06/2019   25OHVITD2 <1.0 08/06/2019   25OHVITD3 49 08/06/2019     Inflammation (CRP: Acute Phase) (ESR: Chronic Phase) Lab Results  Component Value Date   CRP <1 08/06/2019   ESRSEDRATE 2 08/06/2019       Note: Above Lab results reviewed.   Imaging  DG PAIN CLINIC C-ARM 1-60 MIN NO REPORT Fluoro was used, but no Radiologist interpretation will be provided.  Please refer to "NOTES" tab for provider progress note.  Assessment  The primary encounter diagnosis was Chronic pain syndrome. Diagnoses of Chronic low back pain (Primary Area of Pain) (Bilateral) (L>R), Chronic hip pain (Secondary area of Pain) (Bilateral) (L>R), Chronic lower extremity pain (Third area of Pain) (Bilateral) (L>R), Cervical facet syndrome (R>L), Cervical facet hypertrophy, Cervical spondylosis, and Cervicalgia were also pertinent to this visit.  Plan of Care  Problem-specific:  No problem-specific Assessment & Plan notes found for this encounter.  Ricky Mercer has a current medication list which includes the following long-term medication(s): cholecalciferol, [START ON 08/20/2020] gabapentin, oxycodone, [START ON 02/18/2020] oxycodone, pantoprazole, simvastatin, and oxycodone.  Pharmacotherapy (Medications Ordered): No orders of the defined types were placed in this encounter.  Orders:  Orders Placed This Encounter  Procedures  . Radiofrequency,Cervical    Standing Status:   Future    Standing Expiration Date:   06/14/2020    Scheduling Instructions:     Side(s): Left-sided     Level(s): C3, C4, C5, C6, & C7 Medial Branch Nerve(s)     Sedation: With Sedation.     Scheduling Timeframe: As soon as pre-approved    Order Specific Question:   Where will this procedure  be performed?    Answer:   [facility]  . CERVICAL FACET (MEDIAL BRANCH NERVE BLOCK)     CLINICAL INDICATIONS: Neck pain. Indications: Cervical Facet Syndrome.    Standing Status:   Standing    Number of Occurrences:   2    Standing Expiration Date:   02/12/2021    Scheduling Instructions:     LATERALITY: Left-sided     Level: C3-4, C4-5, C5-6 Facet joints (C3, C4, C5, C6, & C7 Medial Branch Nerves)     SEDATION: Patient's  choice.     TIMEFRAME: PRN procedure. (Ricky Mercer will call when needed.)    Order Specific Question:   Where will this procedure be performed?    Answer:   [facility]   Follow-up plan:   Return for Procedure (w/ sedation): (L) C-FCT RFA #1.      Considering:   Therapeutic right lumbar facet RFA #1  Therapeutic right SI joint RFA #1  Diagnostic bilateral Hipjoint injection  Diagnostic bilateral cervical facet block  Diagnostic Caudal ESI + diagnostic epidurogram  Diagnostic right Knee injection  Diagnostic right Genicular NB  Diagnostic bilateral IA shoulderinjection  Diagnostic bilateral suprascapular NB    Palliative PRN treatment(s):   Palliative right lumbar facet block #4  Palliative left lumbar facet block #4  Palliative right SI joint block #3  Palliative left SI joint block #3  Palliative left lumbar facet RFA (last done on 03/21/2017)  Palliative left SI joint RFA (last done on 03/21/2017)  Therapeutic/palliative left CESI #4     Recent Visits Date Type Provider Dept  01/31/20 Procedure visit Milinda Pointer, MD [facility]  12/20/19 Telemedicine Milinda Pointer, MD [facility]  12/04/19 Procedure visit Milinda Pointer, MD [facility]  11/26/19 Telemedicine Milinda Pointer, MD [facility]  Showing recent visits within past 90 days and meeting all other requirements   Future Appointments Date Type Provider Dept  03/19/20 Appointment Milinda Pointer, MD [facility]  Showing future appointments within next 90 days and meeting all other requirements   I discussed the assessment and treatment plan with the patient. The patient was provided an opportunity to ask questions and all were answered. The patient agreed with the plan and demonstrated an understanding of the instructions.  Patient advised to call back or seek an in-person evaluation if the symptoms or condition worsens.  Duration of encounter: 18 minutes.  Note by: Gaspar Cola, MD Date: 02/13/2020; Time: 3:21 PM

## 2020-02-13 NOTE — Patient Instructions (Signed)
____________________________________________________________________________________________  Preparing for Procedure with Sedation  Procedure appointments are limited to planned procedures: . No Prescription Refills. . No disability issues will be discussed. . No medication changes will be discussed.  Instructions: . Oral Intake: Do not eat or drink anything for at least 8 hours prior to your procedure. (Exception: Blood Pressure Medication. See below.) . Transportation: Unless otherwise stated by your physician, you may drive yourself after the procedure. . Blood Pressure Medicine: Do not forget to take your blood pressure medicine with a sip of water the morning of the procedure. If your Diastolic (lower reading)is above 100 mmHg, elective cases will be cancelled/rescheduled. . Blood thinners: These will need to be stopped for procedures. Notify our staff if you are taking any blood thinners. Depending on which one you take, there will be specific instructions on how and when to stop it. . Diabetics on insulin: Notify the staff so that you can be scheduled 1st case in the morning. If your diabetes requires high dose insulin, take only  of your normal insulin dose the morning of the procedure and notify the staff that you have done so. . Preventing infections: Shower with an antibacterial soap the morning of your procedure. . Build-up your immune system: Take 1000 mg of Vitamin C with every meal (3 times a day) the day prior to your procedure. . Antibiotics: Inform the staff if you have a condition or reason that requires you to take antibiotics before dental procedures. . Pregnancy: If you are pregnant, call and cancel the procedure. . Sickness: If you have a cold, fever, or any active infections, call and cancel the procedure. . Arrival: You must be in the facility at least 30 minutes prior to your scheduled procedure. . Children: Do not bring children with you. . Dress appropriately:  Bring dark clothing that you would not mind if they get stained. . Valuables: Do not bring any jewelry or valuables.  Reasons to call and reschedule or cancel your procedure: (Following these recommendations will minimize the risk of a serious complication.) . Surgeries: Avoid having procedures within 2 weeks of any surgery. (Avoid for 2 weeks before or after any surgery). . Flu Shots: Avoid having procedures within 2 weeks of a flu shots or . (Avoid for 2 weeks before or after immunizations). . Barium: Avoid having a procedure within 7-10 days after having had a radiological study involving the use of radiological contrast. (Myelograms, Barium swallow or enema study). . Heart attacks: Avoid any elective procedures or surgeries for the initial 6 months after a "Myocardial Infarction" (Heart Attack). . Blood thinners: It is imperative that you stop these medications before procedures. Let us know if you if you take any blood thinner.  . Infection: Avoid procedures during or within two weeks of an infection (including chest colds or gastrointestinal problems). Symptoms associated with infections include: Localized redness, fever, chills, night sweats or profuse sweating, burning sensation when voiding, cough, congestion, stuffiness, runny nose, sore throat, diarrhea, nausea, vomiting, cold or Flu symptoms, recent or current infections. It is specially important if the infection is over the area that we intend to treat. . Heart and lung problems: Symptoms that may suggest an active cardiopulmonary problem include: cough, chest pain, breathing difficulties or shortness of breath, dizziness, ankle swelling, uncontrolled high or unusually low blood pressure, and/or palpitations. If you are experiencing any of these symptoms, cancel your procedure and contact your primary care physician for an evaluation.  Remember:  Regular Business hours are:    Monday to Thursday 8:00 AM to 4:00 PM  Provider's  Schedule: Malay Fantroy, MD:  Procedure days: Tuesday and Thursday 7:30 AM to 4:00 PM  Bilal Lateef, MD:  Procedure days: Monday and Wednesday 7:30 AM to 4:00 PM ____________________________________________________________________________________________    

## 2020-02-26 ENCOUNTER — Ambulatory Visit
Admission: RE | Admit: 2020-02-26 | Discharge: 2020-02-26 | Disposition: A | Discharge status: Home / Self Care | Payer: Medicare Other | Source: Ambulatory Visit | Attending: Neurosurgery | Admitting: Neurosurgery

## 2020-02-26 ENCOUNTER — Other Ambulatory Visit: Payer: Self-pay

## 2020-02-26 DIAGNOSIS — M545 Low back pain, unspecified: Secondary | ICD-10-CM

## 2020-02-26 DIAGNOSIS — G8929 Other chronic pain: Secondary | ICD-10-CM | POA: Diagnosis present

## 2020-02-26 DIAGNOSIS — M542 Cervicalgia: Secondary | ICD-10-CM | POA: Diagnosis present

## 2020-03-11 ENCOUNTER — Other Ambulatory Visit: Payer: Self-pay

## 2020-03-11 ENCOUNTER — Encounter: Payer: Self-pay | Admitting: Pain Medicine

## 2020-03-11 ENCOUNTER — Ambulatory Visit (HOSPITAL_BASED_OUTPATIENT_CLINIC_OR_DEPARTMENT_OTHER): Payer: Medicare Other | Admitting: Pain Medicine

## 2020-03-11 ENCOUNTER — Ambulatory Visit
Admission: RE | Admit: 2020-03-11 | Discharge: 2020-03-11 | Disposition: A | Discharge status: Home / Self Care | Payer: Medicare Other | Source: Ambulatory Visit | Attending: Pain Medicine | Admitting: Pain Medicine

## 2020-03-11 VITALS — BP 110/76 | HR 69 | Temp 98.4°F | Resp 15 | Ht 69.0 in | Wt 165.0 lb

## 2020-03-11 DIAGNOSIS — M792 Neuralgia and neuritis, unspecified: Secondary | ICD-10-CM | POA: Diagnosis present

## 2020-03-11 DIAGNOSIS — M542 Cervicalgia: Secondary | ICD-10-CM | POA: Insufficient documentation

## 2020-03-11 DIAGNOSIS — Z79899 Other long term (current) drug therapy: Secondary | ICD-10-CM | POA: Insufficient documentation

## 2020-03-11 DIAGNOSIS — R519 Headache, unspecified: Secondary | ICD-10-CM

## 2020-03-11 DIAGNOSIS — M5030 Other cervical disc degeneration, unspecified cervical region: Secondary | ICD-10-CM | POA: Diagnosis present

## 2020-03-11 DIAGNOSIS — G894 Chronic pain syndrome: Secondary | ICD-10-CM | POA: Diagnosis present

## 2020-03-11 DIAGNOSIS — G8918 Other acute postprocedural pain: Secondary | ICD-10-CM

## 2020-03-11 DIAGNOSIS — G4486 Cervicogenic headache: Secondary | ICD-10-CM

## 2020-03-11 DIAGNOSIS — M47812 Spondylosis without myelopathy or radiculopathy, cervical region: Secondary | ICD-10-CM

## 2020-03-11 DIAGNOSIS — M5481 Occipital neuralgia: Secondary | ICD-10-CM

## 2020-03-11 MED ORDER — LACTATED RINGERS IV SOLN: 1000.0000 mL | Freq: Once | INTRAVENOUS | Status: DC

## 2020-03-11 MED ORDER — ROPIVACAINE HCL 2 MG/ML IJ SOLN
9.0000 mL | Freq: Once | INTRAMUSCULAR | Status: AC
  Administered 2020-03-11: 10 mL via PERINEURAL

## 2020-03-11 MED ORDER — HYDROCODONE-ACETAMINOPHEN 5-325 MG PO TABS: 1.0000 | ORAL_TABLET | Freq: Three times a day (TID) | ORAL | 0 refills | Status: DC | PRN

## 2020-03-11 MED ORDER — MIDAZOLAM HCL 5 MG/5ML IJ SOLN: 1.0000 mg | INTRAMUSCULAR | Status: DC | PRN

## 2020-03-11 MED ORDER — OXYCODONE HCL 5 MG PO TABS: 5.0000 mg | ORAL_TABLET | Freq: Three times a day (TID) | ORAL | 0 refills | Status: DC | PRN

## 2020-03-11 MED ORDER — LIDOCAINE HCL 2 % IJ SOLN
INTRAMUSCULAR | Status: AC
  Filled 2020-03-11: qty 20

## 2020-03-11 MED ORDER — DEXAMETHASONE SODIUM PHOSPHATE 10 MG/ML IJ SOLN
10.0000 mg | Freq: Once | INTRAMUSCULAR | Status: AC
  Administered 2020-03-11: 10 mg

## 2020-03-11 MED ORDER — GABAPENTIN 400 MG PO CAPS: 400.0000 mg | ORAL_CAPSULE | Freq: Every day | ORAL | 5 refills | Status: DC

## 2020-03-11 MED ORDER — FENTANYL CITRATE (PF) 100 MCG/2ML IJ SOLN
INTRAMUSCULAR | Status: AC
  Filled 2020-03-11: qty 2

## 2020-03-11 MED ORDER — ROPIVACAINE HCL 2 MG/ML IJ SOLN
INTRAMUSCULAR | Status: AC
  Filled 2020-03-11: qty 10

## 2020-03-11 MED ORDER — MIDAZOLAM HCL 5 MG/5ML IJ SOLN
INTRAMUSCULAR | Status: AC
  Filled 2020-03-11: qty 5

## 2020-03-11 MED ORDER — DEXAMETHASONE SODIUM PHOSPHATE 10 MG/ML IJ SOLN
INTRAMUSCULAR | Status: AC
  Filled 2020-03-11: qty 1

## 2020-03-11 MED ORDER — FENTANYL CITRATE (PF) 100 MCG/2ML IJ SOLN: 25.0000 ug | INTRAMUSCULAR | Status: DC | PRN

## 2020-03-11 MED ORDER — LIDOCAINE HCL 2 % IJ SOLN
20.0000 mL | Freq: Once | INTRAMUSCULAR | Status: AC
  Administered 2020-03-11: 200 mg

## 2020-03-11 NOTE — Progress Notes (Signed)
Safety precautions to be maintained throughout the outpatient stay will include: orient to surroundings, keep bed in low position, maintain call bell within reach at all times, provide assistance with transfer out of bed and ambulation.  

## 2020-03-11 NOTE — Patient Instructions (Addendum)
___________________________________________________________________________________________  Post-Radiofrequency (RF) Discharge Instructions  You have just completed a Radiofrequency Neurotomy.  The following instructions will provide you with information and guidelines for self-care upon discharge.  If at any time you have questions or concerns please call your physician. DO NOT DRIVE YOURSELF!!  Instructions:  Apply ice: Fill a plastic sandwich bag with crushed ice. Cover it with a small towel and apply to injection site. Apply for 15 minutes then remove x 15 minutes. Repeat sequence on day of procedure, until you go to bed. The purpose is to minimize swelling and discomfort after procedure.  Apply heat: Apply heat to procedure site starting the day following the procedure. The purpose is to treat any soreness and discomfort from the procedure.  Food intake: No eating limitations, unless stipulated above.  Nevertheless, if you have had sedation, you may experience some nausea.  In this case, it may be wise to wait at least two hours prior to resuming regular diet.  Physical activities: Keep activities to a minimum for the first 8 hours after the procedure. For the first 24 hours after the procedure, do not drive a motor vehicle,  Operate heavy machinery, power tools, or handle any weapons.  Consider walking with the use of an assistive device or accompanied by an adult for the first 24 hours.  Do not drink alcoholic beverages including beer.  Do not make any important decisions or sign any legal documents. Go home and rest today.  Resume activities tomorrow, as tolerated.  Use caution in moving about as you may experience mild leg weakness.  Use caution in cooking, use of household electrical appliances and climbing steps.  Driving: If you have received any sedation, you are not allowed to drive for 24 hours after your procedure.  Blood thinner: Restart your blood thinner 6 hours after your  procedure. (Only for those taking blood thinners)  Insulin: As soon as you can eat, you may resume your normal dosing schedule. (Only for those taking insulin)  Medications: May resume pre-procedure medications.  Do not take any drugs, other than what has been prescribed to you.  Infection prevention: Keep procedure site clean and dry.  Post-procedure Pain Diary: Extremely important that this be done correctly and accurately. Recorded information will be used to determine the next step in treatment.  Pain evaluated is that of treated area only. Do not include pain from an untreated area.  Complete every hour, on the hour, for the initial 8 hours. Set an alarm to help you do this part accurately.  Do not go to sleep and have it completed later. It will not be accurate.  Follow-up appointment: Keep your follow-up appointment after the procedure. Usually 2-6 weeks after radiofrequency. Bring you pain diary. The information collected will be essential for your long-term care.   Expect:  From numbing medicine (AKA: Local Anesthetics): Numbness or decrease in pain.  Onset: Full effect within 15 minutes of injected.  Duration: It will depend on the type of local anesthetic used. On the average, 1 to 8 hours.   From steroids (when added): Decrease in swelling or inflammation. Once inflammation is improved, relief of the pain will follow.  Onset of benefits: Depends on the amount of swelling present. The more swelling, the longer it will take for the benefits to be seen. In some cases, up to 10 days.  Duration: Steroids will stay in the system x 2 weeks. Duration of benefits will depend on multiple posibilities including persistent irritating factors.    From procedure: Some discomfort is to be expected once the numbing medicine wears off. In the case of radiofrequency procedures, this may last as long as 6 weeks. Additional post-procedure pain medication is provided for this. Discomfort is  minimized if ice and heat are applied as instructed.  Call if:  You experience numbness and weakness that gets worse with time, as opposed to wearing off.  He experience any unusual bleeding, difficulty breathing, or loss of the ability to control your bowel and bladder. (This applies to Spinal procedures only)  You experience any redness, swelling, heat, red streaks, elevated temperature, fever, or any other signs of a possible infection.  Emergency Numbers:  Durning business hours (Monday - Thursday, 8:00 AM - 4:00 PM) (Friday, 9:00 AM - 12:00 Noon): [phone]  After hours: [phone] ____________________________________________________________________________________________   ____________________________________________________________________________________________  Preparing for Procedure with Sedation  Procedure appointments are limited to planned procedures: . No Prescription Refills. . No disability issues will be discussed. . No medication changes will be discussed.  Instructions: . Oral Intake: Do not eat or drink anything for at least 8 hours prior to your procedure. (Exception: Blood Pressure Medication. See below.) . Transportation: Unless otherwise stated by your physician, you may drive yourself after the procedure. . Blood Pressure Medicine: Do not forget to take your blood pressure medicine with a sip of water the morning of the procedure. If your Diastolic (lower reading)is above 100 mmHg, elective cases will be cancelled/rescheduled. . Blood thinners: These will need to be stopped for procedures. Notify our staff if you are taking any blood thinners. Depending on which one you take, there will be specific instructions on how and when to stop it. . Diabetics on insulin: Notify the staff so that you can be scheduled 1st case in the morning. If your diabetes requires high dose insulin, take only  of your normal insulin dose the morning of the procedure and  notify the staff that you have done so. . Preventing infections: Shower with an antibacterial soap the morning of your procedure. . Build-up your immune system: Take 1000 mg of Vitamin C with every meal (3 times a day) the day prior to your procedure. . Antibiotics: Inform the staff if you have a condition or reason that requires you to take antibiotics before dental procedures. . Pregnancy: If you are pregnant, call and cancel the procedure. . Sickness: If you have a cold, fever, or any active infections, call and cancel the procedure. . Arrival: You must be in the facility at least 30 minutes prior to your scheduled procedure. . Children: Do not bring children with you. . Dress appropriately: Bring dark clothing that you would not mind if they get stained. . Valuables: Do not bring any jewelry or valuables.  Reasons to call and reschedule or cancel your procedure: (Following these recommendations will minimize the risk of a serious complication.) . Surgeries: Avoid having procedures within 2 weeks of any surgery. (Avoid for 2 weeks before or after any surgery). . Flu Shots: Avoid having procedures within 2 weeks of a flu shots or . (Avoid for 2 weeks before or after immunizations). . Barium: Avoid having a procedure within 7-10 days after having had a radiological study involving the use of radiological contrast. (Myelograms, Barium swallow or enema study). . Heart attacks: Avoid any elective procedures or surgeries for the initial 6 months after a "Myocardial Infarction" (Heart Attack). . Blood thinners: It is imperative that you stop these medications before procedures. Let   us know if you if you take any blood thinner.  . Infection: Avoid procedures during or within two weeks of an infection (including chest colds or gastrointestinal problems). Symptoms associated with infections include: Localized redness, fever, chills, night sweats or profuse sweating, burning sensation when voiding, cough,  congestion, stuffiness, runny nose, sore throat, diarrhea, nausea, vomiting, cold or Flu symptoms, recent or current infections. It is specially important if the infection is over the area that we intend to treat. Marland Kitchen Heart and lung problems: Symptoms that may suggest an active cardiopulmonary problem include: cough, chest pain, breathing difficulties or shortness of breath, dizziness, ankle swelling, uncontrolled high or unusually low blood pressure, and/or palpitations. If you are experiencing any of these symptoms, cancel your procedure and contact your primary care physician for an evaluation.  Remember:  Regular Business hours are:  Monday to Thursday 8:00 AM to 4:00 PM  Provider's Schedule: Milinda Pointer, MD:  Procedure days: Tuesday and Thursday 7:30 AM to 4:00 PM  Gillis Santa, MD:  Procedure days: Monday and Wednesday 7:30 AM to 4:00 PM ____________________________________________________________________________________________  Two prescriptions for Hydrocodone have been sent to your pharmacy, each being one week's worth. Three prescriptions for oxycodone and one for Gabapentin have been sent to your pharmacy.  Take your oxycodone as usual, then if you continue to need pain medication, take the Hydrocodone.

## 2020-03-11 NOTE — Progress Notes (Signed)
PROVIDER NOTE: Information contained herein reflects review and annotations entered in association with encounter. Interpretation of such information and data should be left to medically-trained personnel. Information provided to patient can be located elsewhere in the medical record under "Patient Instructions". Document created using STT-dictation technology, any transcriptional errors that may result from process are unintentional.    Patient: Ricky Mercer  Service Category: Procedure  Provider: Gaspar Cola, MD  DOB: Jan 29, 1951  DOS: 03/11/2020  Location: [facility]  MRN: [id number]  Setting: Ambulatory - outpatient  Referring Provider: Milinda Pointer, MD  Type: Established Patient  Specialty: Interventional Pain Management  PCP: Sofie Hartigan, MD   Primary Reason for Visit: Interventional Pain Management Treatment. CC: Neck Pain  Procedure:          Anesthesia, Analgesia, Anxiolysis:  Type: Cervical Facet, Medial Branch Radiofrequency Ablation  #1  Primary Purpose: Therapeutic Region: Posterolateral cervical spine region Level: C3, C4, C5, C6, & C7 Medial Branch Level(s). Lesioning of these levels should completely denervate the C3-4, C4-5, C5-6, and the C6-7 cervical facet joints. Laterality: Left Paraspinal  Type: Moderate (Conscious) Sedation combined with Local Anesthesia Indication(s): Analgesia and Anxiety Route: Intravenous (IV) IV Access: Secured Sedation: Meaningful verbal contact was maintained at all times during the procedure  Local Anesthetic: Lidocaine 1-2%  Position: Prone with head of the table was raised to facilitate breathing.   Indications: 1. Cervical facet syndrome (R>L)   2. Cervicalgia   3. Cervical facet hypertrophy   4. Cervical spondylosis   5. Cervicogenic headache   6. Cervico-occipital neuralgia   7. DDD (degenerative disc disease), cervical   8. Spondylosis without myelopathy or radiculopathy, cervical region     [name] has been dealing with the above chronic pain for longer than three months and has either failed to respond, was unable to tolerate, or simply did not get enough benefit from other more conservative therapies including, but not limited to: 1. Over-the-counter medications 2. Anti-inflammatory medications 3. Muscle relaxants 4. Membrane stabilizers 5. Opioids 6. Physical therapy and/or chiropractic manipulation 7. Modalities (Heat, ice, etc.) 8. Invasive techniques such as nerve blocks. [name] has attained more than 50% relief of the pain from a series of diagnostic injections conducted in separate occasions.  Pain Score: Pre-procedure: 5 /10 Post-procedure: 2 /10  Pharmacotherapy Assessment   Analgesic: Oxycodone 5 mg, 1 tab PO q 8 hrs (15 mg/day of oxycodone) MME/day: 22.5 mg/day.   Monitoring: Ricky Mercer PMP: PDMP reviewed during this encounter.       Pharmacotherapy: No side-effects or adverse reactions reported. Compliance: No problems identified. Effectiveness: Clinically acceptable.  Ricky Specking, RN  03/11/2020  1:09 PM  Sign when Signing Visit Safety precautions to be maintained throughout the outpatient stay will include: orient to surroundings, keep bed in low position, maintain call bell within reach at all times, provide assistance with transfer out of bed and ambulation.     UDS:  Summary  Date Value Ref Range Status  08/06/2019 Note  Final    Comment:    ==================================================================== [employer] Select 13 (MW) ==================================================================== Test                             Result       Flag       Units Drug Present   Oxycodone  886                     ng/mg creat   Oxymorphone                    886                     ng/mg creat   Noroxycodone                   1251                    ng/mg creat   Noroxymorphone                 361                      ng/mg creat    Sources of oxycodone are scheduled prescription medications.    Oxymorphone, noroxycodone, and noroxymorphone are expected    metabolites of oxycodone. Oxymorphone is also available as a    scheduled prescription medication. ==================================================================== Test                      Result    Flag   Units      Ref Range   Creatinine              51               mg/dL      >=20 ==================================================================== Declared Medications:  Medication list was not provided. ==================================================================== For clinical consultation, please call [phone]. ====================================================================      Pre-op Assessment:  [name] is a 69 y.o. (year old), male patient, seen today for interventional treatment. He  has a past surgical history that includes Nose surgery; Hand surgery (Bilateral); Colonoscopy (10/24/2012); Upper gi endoscopy (12/18/2012); Tonsillectomy (2009); Skin cancer excision (Left, 2015); Mass excision (Right, 07/18/2015); Esophagogastroduodenoscopy (egd) with propofol (N/A, 10/28/2015); prostate seeding; Hernia repair; Cholecystectomy (N/A, 12/02/2015); Esophagogastroduodenoscopy (N/A, 11/17/2017); and Colonoscopy with propofol (N/A, 11/17/2017). [name] has a current medication list which includes the following prescription(s): aspirin, cholecalciferol, [START ON 08/20/2020] gabapentin, [START ON 03/19/2020] oxycodone, [START ON 04/18/2020] oxycodone, [START ON 05/18/2020] oxycodone, pantoprazole, simvastatin, hydrocodone-acetaminophen, and [START ON 03/18/2020] hydrocodone-acetaminophen, and the following Facility-Administered Medications: fentanyl, lactated ringers, and midazolam. His primarily concern today is the Neck Pain  Initial Vital Signs:  Pulse/HCG Rate: 69ECG Heart Rate: 67 Temp: 98.4 F (36.9 C) Resp: 16 BP:  114/79 SpO2: 99 %  BMI: Estimated body mass index is 24.37 kg/m as calculated from the following:   Height as of this encounter: 5\' 9"  (1.753 m).   Weight as of this encounter: 165 lb (74.8 kg).  Risk Assessment: Allergies: Reviewed. He has No Known Allergies.  Allergy Precautions: None required Coagulopathies: Reviewed. None identified.  Blood-thinner therapy: None at this time Active Infection(s): Reviewed. None identified. Mr. Gascoigne is afebrile  Site Confirmation: Mr. Jarrells was asked to confirm the procedure and laterality before marking the site Procedure checklist: Completed Consent: Before the procedure and under the influence of no sedative(s), amnesic(s), or anxiolytics, the patient was informed of the treatment options, risks and possible complications. To fulfill our ethical and legal obligations, as recommended by the American Medical Association's Code of Ethics, I have informed the patient of my clinical impression; the nature and purpose of the treatment or procedure; the risks, benefits, and possible complications of  the intervention; the alternatives, including doing nothing; the risk(s) and benefit(s) of the alternative treatment(s) or procedure(s); and the risk(s) and benefit(s) of doing nothing. The patient was provided information about the general risks and possible complications associated with the procedure. These may include, but are not limited to: failure to achieve desired goals, infection, bleeding, organ or nerve damage, allergic reactions, paralysis, and death. In addition, the patient was informed of those risks and complications associated to Spine-related procedures, such as failure to decrease pain; infection (i.e.: Meningitis, epidural or intraspinal abscess); bleeding (i.e.: epidural hematoma, subarachnoid hemorrhage, or any other type of intraspinal or peri-dural bleeding); organ or nerve damage (i.e.: Any type of peripheral nerve, nerve root, or spinal  cord injury) with subsequent damage to sensory, motor, and/or autonomic systems, resulting in permanent pain, numbness, and/or weakness of one or several areas of the body; allergic reactions; (i.e.: anaphylactic reaction); and/or death. Furthermore, the patient was informed of those risks and complications associated with the medications. These include, but are not limited to: allergic reactions (i.e.: anaphylactic or anaphylactoid reaction(s)); adrenal axis suppression; blood sugar elevation that in diabetics may result in ketoacidosis or comma; water retention that in patients with history of congestive heart failure may result in shortness of breath, pulmonary edema, and decompensation with resultant heart failure; weight gain; swelling or edema; medication-induced neural toxicity; particulate matter embolism and blood vessel occlusion with resultant organ, and/or nervous system infarction; and/or aseptic necrosis of one or more joints. Finally, the patient was informed that Medicine is not an exact science; therefore, there is also the possibility of unforeseen or unpredictable risks and/or possible complications that may result in a catastrophic outcome. The patient indicated having understood very clearly. We have given the patient no guarantees and we have made no promises. Enough time was given to the patient to ask questions, all of which were answered to the patient's satisfaction. Mr. Raska has indicated that he wanted to continue with the procedure. Attestation: I, the ordering provider, attest that I have discussed with the patient the benefits, risks, side-effects, alternatives, likelihood of achieving goals, and potential problems during recovery for the procedure that I have provided informed consent. Date  Time: 03/11/2020  1:01 PM  Pre-Procedure Preparation:  Monitoring: As per clinic protocol. Respiration, ETCO2, SpO2, BP, heart rate and rhythm monitor placed and checked for adequate  function Safety Precautions: Patient was assessed for positional comfort and pressure points before starting the procedure. Time-out: I initiated and conducted the "Time-out" before starting the procedure, as per protocol. The patient was asked to participate by confirming the accuracy of the "Time Out" information. Verification of the correct person, site, and procedure were performed and confirmed by me, the nursing staff, and the patient. "Time-out" conducted as per Joint Commission's Universal Protocol (UP.01.01.01). Time: 1338  Description of Procedure:          Laterality: Left Level: C3, C4, C5, C6, & C7 Medial Branch Level(s). Area Prepped: Entire Posterior Cervico-thoracic Region DuraPrep (Iodine Povacrylex [0.7% available iodine] and Isopropyl Alcohol, 74% w/w) Safety Precautions: Aspiration looking for blood return was conducted prior to all injections. At no point did we inject any substances, as a needle was being advanced. Before injecting, the patient was told to immediately notify me if he was experiencing any new onset of "ringing in the ears, or metallic taste in the mouth". No attempts were made at seeking any paresthesias. Safe injection practices and needle disposal techniques used. Medications properly checked for expiration dates.  SDV (single dose vial) medications used. After the completion of the procedure, all disposable equipment used was discarded in the proper designated medical waste containers. Local Anesthesia: Protocol guidelines were followed. The patient was positioned over the fluoroscopy table. The area was prepped in the usual manner. The time-out was completed. The target area was identified using fluoroscopy. A 12-in long, straight, sterile hemostat was used with fluoroscopic guidance to locate the targets for each level blocked. Once located, the skin was marked with an approved surgical skin marker. Once all sites were marked, the skin (epidermis, dermis, and  hypodermis), as well as deeper tissues (fat, connective tissue and muscle) were infiltrated with a small amount of a short-acting local anesthetic, loaded on a 10cc syringe with a 25G, 1.5-in  Needle. An appropriate amount of time was allowed for local anesthetics to take effect before proceeding to the next step. Local Anesthetic: Lidocaine 2.0% The unused portion of the local anesthetic was discarded in the proper designated containers. Technical explanation of process:  Radiofrequency Ablation (RFA) C3 Medial Branch Nerve RFA: The target area for the C3 dorsal medial articular branch is the lateral concave waist of the articular pillar of C3. Under fluoroscopic guidance, a Radiofrequency needle was inserted until contact was made with os over the postero-lateral aspect of the articular pillar of C3 (target area). Sensory and motor testing was conducted to properly adjust the position of the needle. Once satisfactory placement of the needle was achieved, the numbing solution was slowly injected after negative aspiration for blood. 2.0 mL of the nerve block solution was injected without difficulty or complication. After waiting for at least 3 minutes, the ablation was performed. Once completed, the needle was removed intact. C4 Medial Branch Nerve RFA: The target area for the C4 dorsal medial articular branch is the lateral concave waist of the articular pillar of C4. Under fluoroscopic guidance, a Radiofrequency needle was inserted until contact was made with os over the postero-lateral aspect of the articular pillar of C4 (target area). Sensory and motor testing was conducted to properly adjust the position of the needle. Once satisfactory placement of the needle was achieved, the numbing solution was slowly injected after negative aspiration for blood. 2.0 mL of the nerve block solution was injected without difficulty or complication. After waiting for at least 3 minutes, the ablation was performed. Once  completed, the needle was removed intact. C5 Medial Branch Nerve RFA: The target area for the C5 dorsal medial articular branch is the lateral concave waist of the articular pillar of C5. Under fluoroscopic guidance, a Radiofrequency needle was inserted until contact was made with os over the postero-lateral aspect of the articular pillar of C5 (target area). Sensory and motor testing was conducted to properly adjust the position of the needle. Once satisfactory placement of the needle was achieved, the numbing solution was slowly injected after negative aspiration for blood. 2.0 mL of the nerve block solution was injected without difficulty or complication. After waiting for at least 3 minutes, the ablation was performed. Once completed, the needle was removed intact. C6 Medial Branch Nerve RFA: The target area for the C6 dorsal medial articular branch is the lateral concave waist of the articular pillar of C6. Under fluoroscopic guidance, a Radiofrequency needle was inserted until contact was made with os over the postero-lateral aspect of the articular pillar of C6 (target area). Sensory and motor testing was conducted to properly adjust the position of the needle. Once satisfactory placement of the needle was  achieved, the numbing solution was slowly injected after negative aspiration for blood. 2.0 mL of the nerve block solution was injected without difficulty or complication. After waiting for at least 3 minutes, the ablation was performed. Once completed, the needle was removed intact. C7 Medial Branch Nerve RFA: The target for the C7 dorsal medial articular branch lies on the superior-medial tip of the C7 transverse process. Under fluoroscopic guidance, a Radiofrequency needle was inserted until contact was made with os over the postero-lateral aspect of the articular pillar of C7 (target area). Sensory and motor testing was conducted to properly adjust the position of the needle. Once satisfactory  placement of the needle was achieved, the numbing solution was slowly injected after negative aspiration for blood. 2.0 mL of the nerve block solution was injected without difficulty or complication. After waiting for at least 3 minutes, the ablation was performed. Once completed, the needle was removed intact. Radiofrequency lesioning (ablation):  Radiofrequency Generator: NeuroTherm [id number] Sensory Stimulation Parameters: 50 Hz was used to locate & identify the nerve, making sure that the needle was positioned such that there was no sensory stimulation below 0.3 V or above 0.7 V. Motor Stimulation Parameters: 2 Hz was used to evaluate the motor component. Care was taken not to lesion any nerves that demonstrated motor stimulation of the lower extremities at an output of less than 2.5 times that of the sensory threshold, or a maximum of 2.0 V. Lesioning Technique Parameters: Standard Radiofrequency settings. (Not bipolar or pulsed.) Temperature Settings: 80 degrees C Lesioning time: 60 seconds Intra-operative Compliance: Compliant Materials & Medications: Needle(s) (Electrode/Cannula) Type: Teflon-coated, curved tip, Radiofrequency needle(s) Gauge: 22G Length: 10cm Numbing solution: 0.2% PF-Ropivacaine + Triamcinolone (40 mg/mL) diluted to a final concentration of 4 mg of Triamcinolone/mL of Ropivacaine The unused portion of the solution was discarded in the proper designated containers.  Once the entire procedure was completed, the treated area was cleaned, making sure to leave some of the prepping solution back to take advantage of its long term bactericidal properties. Intra-operative Compliance: Compliant  Anatomy Reference Guide:          Vitals:   03/11/20 1357 03/11/20 1402 03/11/20 1407 03/11/20 1414  BP: 122/87 125/83 117/79 110/76  Pulse:      Resp: 14 18 13 15   Temp:      SpO2: 100% 97% 97% 98%  Weight:      Height:        Start Time: 1339 hrs. End Time: 1412  hrs.  Imaging Guidance (Spinal):          Type of Imaging Technique: Fluoroscopy Guidance (Spinal) Indication(s): Assistance in needle guidance and placement for procedures requiring needle placement in or near specific anatomical locations not easily accessible without such assistance. Exposure Time: Please see nurses notes. Contrast: None used. Fluoroscopic Guidance: I was personally present during the use of fluoroscopy. "Tunnel Vision Technique" used to obtain the best possible view of the target area. Parallax error corrected before commencing the procedure. "Direction-depth-direction" technique used to introduce the needle under continuous pulsed fluoroscopy. Once target was reached, antero-posterior, oblique, and lateral fluoroscopic projection used confirm needle placement in all planes. Images permanently stored in EMR. Interpretation: No contrast injected. I personally interpreted the imaging intraoperatively. Adequate needle placement confirmed in multiple planes. Permanent images saved into the patient's record.  Antibiotic Prophylaxis:   Anti-infectives (From admission, onward)   None     Indication(s): None identified  Post-operative Assessment:  Post-procedure Vital Signs:  Pulse/HCG Rate:  6972 Temp: 98.4 F (36.9 C) Resp: 15 BP: 110/76 SpO2: 98 %  EBL: None  Complications: No immediate post-treatment complications observed by team, or reported by patient.  Note: The patient tolerated the entire procedure well. A repeat set of vitals were taken after the procedure and the patient was kept under observation following institutional policy, for this type of procedure. Post-procedural neurological assessment was performed, showing return to baseline, prior to discharge. The patient was provided with post-procedure discharge instructions, including a section on how to identify potential problems. Should any problems arise concerning this procedure, the patient was given  instructions to immediately contact us, at any time, without hesitation. In any case, we plan to contact the patient by telephone for a follow-up status report regarding this interventional procedure.  Comments:  No additional relevant information.  Plan of Care  Orders:  Orders Placed This Encounter  Procedures  . Radiofrequency,Cervical    Scheduling Instructions:     Side(s): Left-sided     Level(s): C3, C4, C5, C6, & C7 Medial Branch Nerve(s)     Sedation: Patient's choice.     Timeframe: Today    Order Specific Question:   Where will this procedure be performed?    Answer:   [facility]  . [facility] C-ARM 1-60 MIN NO REPORT    Intraoperative interpretation by procedural physician at [facility].    Standing Status:   Standing    Number of Occurrences:   1    Order Specific Question:   Reason for exam:    Answer:   Assistance in needle guidance and placement for procedures requiring needle placement in or near specific anatomical locations not easily accessible without such assistance.  Raelene Bott Select 13 (MW), Urine    Volume: 30 ml(s). Minimum 3 ml of urine is needed. Document temperature of fresh sample. Indications: Long term (current) use of opiate analgesic (O70.962)    Order Specific Question:   Release to patient    Answer:   Immediate  . Informed Consent Details: Physician/Practitioner Attestation; Transcribe to consent form and obtain patient signature    Nursing Order: Transcribe to consent form and obtain patient signature. Note: Always confirm laterality of pain with Mr. Senteno, before procedure. Procedure: Cervical Facet Radiofrequency Ablation Indication/Reason: Neck Pain (Cervicalgia), with our without referred arm pain, due to Facet Joint Arthralgia (Joint Pain) known as Cervical Facet Syndrome, secondary to Cervical, and/or Cervico-thoracic Spondylosis (Arthritis of the Spine), without myelopathy or radiculopathy (Nerve  Damage). Provider Attestation: I, Temelec Dossie Arbour, MD, (Pain Management Specialist), the physician/practitioner, attest that I have discussed with the patient the benefits, risks, side effects, alternatives, likelihood of achieving goals and potential problems during recovery for the procedure that I have provided informed consent.  . Provide equipment / supplies at bedside    Equipment required: Sterile "Radiofrequency Tray"; Large hemostat (1); Small hemostat (1); Towels (6-8); 4x4 sterile sponge pack (1) Radiofrequency Needle(s): Size: Regular Quantity: 5    Standing Status:   Standing    Number of Occurrences:   1    Order Specific Question:   Specify    Answer:   Radiofrequency Tray   Chronic Opioid Analgesic:  Oxycodone 5 mg, 1 tab PO q 8 hrs (15 mg/day of oxycodone) MME/day: 22.5 mg/day.   Medications ordered for procedure: Meds ordered this encounter  Medications  . lidocaine (XYLOCAINE) 2 % (with pres) injection 400 mg  . lactated ringers infusion 1,000 mL  . midazolam (  VERSED) 5 MG/5ML injection 1-2 mg    Make sure Flumazenil is available in the pyxis when using this medication. If oversedation occurs, administer 0.2 mg IV over 15 sec. If after 45 sec no response, administer 0.2 mg again over 1 min; may repeat at 1 min intervals; not to exceed 4 doses (1 mg)  . fentaNYL (SUBLIMAZE) injection 25-50 mcg    Make sure Narcan is available in the pyxis when using this medication. In the event of respiratory depression (RR< 8/min): Titrate NARCAN (naloxone) in increments of 0.1 to 0.2 mg IV at 2-3 minute intervals, until desired degree of reversal.  . ropivacaine (PF) 2 mg/mL (0.2%) (NAROPIN) injection 9 mL  . dexamethasone (DECADRON) injection 10 mg  . oxyCODONE (OXY IR/ROXICODONE) 5 MG immediate release tablet    Sig: Take 1 tablet (5 mg total) by mouth every 8 (eight) hours as needed for severe pain. Must last 30 days    Dispense:  90 tablet    Refill:  0    Chronic Pain:  STOP Act (Not applicable) Fill 1 day early if closed on refill date. Do not fill until: 03/19/2020. To last until: 04/18/2020. Avoid benzodiazepines within 8 hours of opioids  . gabapentin (NEURONTIN) 400 MG capsule    Sig: Take 1-2 capsules (400-800 mg total) by mouth at bedtime.    Dispense:  60 capsule    Refill:  5    Fill one day early if pharmacy is closed on scheduled refill date. May substitute for generic if available.  Marland Kitchen oxyCODONE (OXY IR/ROXICODONE) 5 MG immediate release tablet    Sig: Take 1 tablet (5 mg total) by mouth every 8 (eight) hours as needed for severe pain. Must last 30 days    Dispense:  90 tablet    Refill:  0    Chronic Pain: STOP Act (Not applicable) Fill 1 day early if closed on refill date. Do not fill until: 04/18/2020. To last until: 05/18/2020. Avoid benzodiazepines within 8 hours of opioids  . oxyCODONE (OXY IR/ROXICODONE) 5 MG immediate release tablet    Sig: Take 1 tablet (5 mg total) by mouth every 8 (eight) hours as needed for severe pain. Must last 30 days    Dispense:  90 tablet    Refill:  0    Chronic Pain: STOP Act (Not applicable) Fill 1 day early if closed on refill date. Do not fill until: 05/18/2020. To last until: 06/17/2020. Avoid benzodiazepines within 8 hours of opioids  . HYDROcodone-acetaminophen (NORCO/VICODIN) 5-325 MG tablet    Sig: Take 1 tablet by mouth every 8 (eight) hours as needed for up to 7 days for severe pain. Must last 7 days.    Dispense:  21 tablet    Refill:  0    For acute post-operative pain. Not to be refilled. Must last 7 days.  Marland Kitchen HYDROcodone-acetaminophen (NORCO/VICODIN) 5-325 MG tablet    Sig: Take 1 tablet by mouth every 8 (eight) hours as needed for up to 7 days for severe pain. Must last for 7 days.    Dispense:  21 tablet    Refill:  0    For acute post-operative pain. Not to be refilled. Must last 7 days.   Medications administered: We administered lidocaine, ropivacaine (PF) 2 mg/mL (0.2%), and dexamethasone.  See the  medical record for exact dosing, route, and time of administration.  Follow-up plan:   Return in about 2 weeks (around 03/25/2020) for RFA (w/ sedation): (R) C-FCT RFA #1.  Considering:   Therapeutic right cervical facet RFA #1  Therapeutic right lumbar facet RFA #1  Therapeutic right SI joint RFA #1  Diagnostic bilateral Hipjoint injection  Diagnostic Caudal ESI + diagnostic epidurogram  Diagnostic right Knee injection  Diagnostic right Genicular NB  Diagnostic bilateral IA shoulderinjection  Diagnostic bilateral suprascapular NB    Palliative PRN treatment(s):   Palliative bilateral cervical facet block #2  Palliative right lumbar facet block #4  Palliative left lumbar facet block #4  Palliative right SI joint block #3  Palliative left SI joint block #3  Palliative left lumbar facet RFA (last done on 03/21/2017)  Palliative left SI joint RFA (last done on 03/21/2017)  Therapeutic/palliative left CESI #4  Palliative left cervical facet RFA #2 (last done 03/11/2020)    Recent Visits Date Type Provider Dept  01/31/20 Procedure visit Milinda Pointer, MD [facility]  12/20/19 Telemedicine Milinda Pointer, MD [facility]  Showing recent visits within past 90 days and meeting all other requirements Today's Visits Date Type Provider Dept  03/11/20 Procedure visit Milinda Pointer, MD [facility]  Showing today's visits and meeting all other requirements Future Appointments Date Type Provider Dept  03/19/20 Appointment Milinda Pointer, MD [facility]  04/08/20 Appointment Milinda Pointer, MD [facility]  Showing future appointments within next 90 days and meeting all other requirements  Disposition: Discharge home  Discharge (Date  Time): 03/11/2020; 1420 hrs.   Primary Care Physician: Sofie Hartigan, MD Location: [facility] Note by: Gaspar Cola, MD Date: 03/11/2020;  Time: 2:56 PM  Disclaimer:  Medicine is not an Chief Strategy Officer. The only guarantee in medicine is that nothing is guaranteed. It is important to note that the decision to proceed with this intervention was based on the information collected from the patient. The Data and conclusions were drawn from the patient's questionnaire, the interview, and the physical examination. Because the information was provided in large part by the patient, it cannot be guaranteed that it has not been purposely or unconsciously manipulated. Every effort has been made to obtain as much relevant data as possible for this evaluation. It is important to note that the conclusions that lead to this procedure are derived in large part from the available data. Always take into account that the treatment will also be dependent on availability of resources and existing treatment guidelines, considered by other Pain Management Practitioners as being common knowledge and practice, at the time of the intervention. For Medico-Legal purposes, it is also important to point out that variation in procedural techniques and pharmacological choices are the acceptable norm. The indications, contraindications, technique, and results of the above procedure should only be interpreted and judged by a Board-Certified Interventional Pain Specialist with extensive familiarity and expertise in the same exact procedure and technique.

## 2020-03-12 ENCOUNTER — Telehealth: Payer: Self-pay

## 2020-03-12 NOTE — Telephone Encounter (Signed)
Post procedure phone call.  Patient states he is doing ok.  

## 2020-03-13 ENCOUNTER — Other Ambulatory Visit: Payer: Self-pay | Admitting: Gastroenterology

## 2020-03-13 DIAGNOSIS — R1013 Epigastric pain: Secondary | ICD-10-CM

## 2020-03-17 LAB — TOXASSURE SELECT 13 (MW), URINE

## 2020-03-18 ENCOUNTER — Encounter: Payer: Self-pay | Admitting: Pain Medicine

## 2020-03-18 NOTE — Progress Notes (Signed)
Patient: Ricky Mercer  Service Category: E/M  Provider: Gaspar Cola, MD  DOB: August 17, 1951  DOS: 03/19/2020  Location: Office  MRN: [id number]  Setting: Ambulatory outpatient  Referring Provider: Sofie Hartigan, MD  Type: Established Patient  Specialty: Interventional Pain Management  PCP: Sofie Hartigan, MD  Location: Remote location  Delivery: TeleHealth     Virtual Encounter - Pain Management PROVIDER NOTE: Information contained herein reflects review and annotations entered in association with encounter. Interpretation of such information and data should be left to medically-trained personnel. Information provided to patient can be located elsewhere in the medical record under "Patient Instructions". Document created using STT-dictation technology, any transcriptional errors that may result from process are unintentional.    Contact & Pharmacy Preferred: [phone] Home: [phone] (home) Mobile: [phone] (mobile) E-mail: No e-mail address on record  [id number] [place] [address] [place]Phone: [phone]: [phone]  Pre-screening  Mr. LWakeleyoffered "in-person" vs "virtual" encounter. He indicated preferring virtual for this encounter.   Reason COVID-19*  Social distancing based on CDC and AMA recommendations.   I contacted CDelane Gingeron 03/19/2020 via telephone.      I clearly identified myself as FGaspar Cola MD. I verified that I was speaking with the correct person using two identifiers (Name: Ricky Mercer and date of birth: 125-Oct-1952.  Consent I sought verbal advanced consent from CDelane Gingerfor virtual visit interactions. I informed [name] possible security and privacy concerns, risks, and limitations associated with providing "not-in-person" medical evaluation and management services. I also informed Mr. LCarrekerof the availability of "in-person" appointments.  Finally, I informed him that there would be a charge for the virtual visit and that he could be  personally, fully or partially, financially responsible for it. Mr. LSouderexpressed understanding and agreed to proceed.   Historic Elements   Mr. CRUDELL ORTMANis a 69y.o. year old, male patient evaluated today after his last contact with our practice on 03/12/2020. Ricky Mercer has a past medical history of Acute postoperative pain (03/21/2017), Bilateral arm pain, Cardiomyopathy ([place], Diverticulosis, Dupuytren's contracture, Erosive gastritis, Foot pain, bilateral, GERD (gastroesophageal reflux disease), Hand weakness, Hyperlipidemia, Knee pain, Midline low back pain, Osteoarthritis, Pain in neck, Prostate cancer ([place], Reactive airway disease, Seasonal allergies, Situational anxiety, and Sleep apnea. He also  has a past surgical history that includes Nose surgery; Hand surgery (Bilateral); Colonoscopy (10/24/2012); Upper gi endoscopy (12/18/2012); Tonsillectomy (2009); Skin cancer excision (Left, 2015); Mass excision (Right, 07/18/2015); Esophagogastroduodenoscopy (egd) with propofol (N/A, 10/28/2015); prostate seeding; Hernia repair; Cholecystectomy (N/A, 12/02/2015); Esophagogastroduodenoscopy (N/A, 11/17/2017); and Colonoscopy with propofol (N/A, 11/17/2017). Mr. LKolenovichas a current medication list which includes the following prescription(s): aspirin, cholecalciferol, [START ON 08/20/2020] gabapentin, oxycodone, [START ON 04/18/2020] oxycodone, [START ON 05/18/2020] oxycodone, pantoprazole, simvastatin, meloxicam, and turmeric. He  reports that he has never smoked. He has never used smokeless tobacco. He reports that he does not drink alcohol and does not use drugs. Mr. LHoshinohas No Known Allergies.   HPI  Today, he is being contacted for a post-procedure assessment.  The patient was placed on the schedule today to go over the results of the urine drug screening test.  He is was within normal limits.  However,  today they went ahead and also assessed his left cervical facet radiofrequency which was done only 8 days ago on 03/11/2020.  As expected,  he is still having quite a bit of pain in the area since it may take as long as 6 weeks to fully recover from the radiofrequency.  He is also scheduled to return on 04/08/2020 for radiofrequency ablation on the right cervical facets.  Today he had a whole bunch of questions for me regarding medications and expectations.  I took a lot of time today to make sure that he understood what to expect with regards to this radiofrequency so that he would not have any type of unrealistic expectations.  He was asked if he could tolerate increasing the gabapentin dose, but he indicated that it makes him feel "loopy".  He also wanted to know if his oxycodone may have been contributing to this as well.  I told him that both, the oxycodone and the gabapentin can make him feel like that.  There is also a possibility that it could be a drug to drug interaction between the 2 making him feel in that way.  Today I gave him some ideas as to what he could do with the oxycodone.  I told him that he could break the tablets in half and perhaps take only half of a tablet and determine if by doing that his symptoms improved.  If they did, then we know that it was the oxycodone.  The gabapentin in particular, I rather not have him stop it since it is the best that is not to be taken whenever there is neurogenic pain such as what you experienced after a radiofrequency ablation of the medial branches.  He also asked about the possibility of some of those medications causing some depression.  I informed the patient that a significant amount of patients with chronic pain will have depression.  This with depression could be primary or secondary to their chronic pain.  However, I also reminded him that at the time of the radiofrequency ablation we did inject some steroids in the area which also have the potential  to cause some mood disturbances.  He understood and accepted.  Hopefully we he will continue to improve after the 03/11/2020 radiofrequency and he will also get some additional benefit from the one on the right side.  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  Post-Procedure Evaluation  Procedure: (03/11/2020) (8 days ago) therapeutic left cervical facet RFA #1 under fluoroscopic guidance and IV sedation Pre-procedure pain level: 5/10 Post-procedure: 2/10 (> 50% relief)  Sedation: Sedation provided.  Effectiveness during initial hour after procedure(Ultra-Short Term Relief): 100 %.  Local anesthetic used: Long-acting (4-6 hours) Effectiveness: Defined as any analgesic benefit obtained secondary to the administration of local anesthetics. This carries significant diagnostic value as to the etiological location, or anatomical origin, of the pain. Duration of benefit is expected to coincide with the duration of the local anesthetic used.  Effectiveness during initial 4-6 hours after procedure(Short-Term Relief): 100 %.  Long-term benefit: Defined as any relief past the pharmacologic duration of the local anesthetics.  Effectiveness past the initial 6 hours after procedure(Long-Term Relief): 100 % (last only 2 days).  Current benefits: Defined as benefit that persist at this time.   Analgesia:  No significant benefit yet, probably due to the fact that it has been only 8 days since the radiofrequency.  We have reminded the patient that healing  from a radiofrequency ablation may take as long as 6 weeks. Function: No improvement yet ROM: No improvement yet  Pharmacotherapy Assessment  Analgesic: Oxycodone 5 mg, 1 tab PO q 8 hrs (15 mg/day of oxycodone) MME/day: 22.5 mg/day.   Monitoring: [place] PMP: PDMP reviewed  during this encounter.       Pharmacotherapy: No side-effects or adverse reactions reported. Compliance: No problems identified. Effectiveness: Clinically acceptable. Plan: Refer to "POC".  UDS:  Summary  Date Value Ref Range Status  03/11/2020 Note  Corrected    Comment:    ==================================================================== [employer] Select 13 (MW) ==================================================================== Test                             Result       Flag       Units  Drug Present and Declared for Prescription Verification   Oxycodone                      97           EXPECTED   ng/mg creat   Oxymorphone                    930          EXPECTED   ng/mg creat   Noroxycodone                   601          EXPECTED   ng/mg creat   Noroxymorphone                 369          EXPECTED   ng/mg creat    Sources of oxycodone are scheduled prescription medications.    Oxymorphone, noroxycodone, and noroxymorphone are expected    metabolites of oxycodone. Oxymorphone is also available as a    scheduled prescription medication.  Drug Present not Declared for Prescription Verification   Oxazepam                       12           UNEXPECTED ng/mg creat    Oxazepam may be administered as a scheduled prescription medication;    it is also an expected metabolite of other benzodiazepine drugs,    including diazepam, chlordiazepoxide, prazepam, clorazepate,    halazepam, and temazepam.  Drug Absent but Declared for Prescription Verification   Hydrocodone                    Not Detected UNEXPECTED ng/mg creat ==================================================================== Test                      Result    Flag   Units      Ref Range   Creatinine              227              mg/dL      >=20 ==================================================================== Declared Medications:  The flagging and interpretation on this report are based on the  following  declared medications.  Unexpected results may arise from  inaccuracies in the declared medications.   **Note: The testing scope of this panel includes these medications:   Hydrocodone (Norco)  Oxycodone (Roxicodone)   **Note: The testing scope of this panel does not include the  following reported medications:   Acetaminophen (Norco)  Aspirin  Cholecalciferol  Gabapentin (Neurontin)  Pantoprazole (Protonix)  Simvastatin (Zocor) ==================================================================== For clinical consultation, please call [phone]. ====================================================================     Laboratory Chemistry Profile   Renal Lab Results  Component Value Date   BUN 11 08/06/2019   CREATININE 0.93 08/06/2019   BCR 12 08/06/2019   GFRAA 99 08/06/2019   GFRNONAA 86 08/06/2019     Hepatic Lab Results  Component Value Date   AST 23 08/06/2019   ALT 41 11/25/2015   ALBUMIN 4.3 08/06/2019   ALKPHOS 72 08/06/2019     Electrolytes Lab Results  Component Value Date   NA 142 08/06/2019   K 4.3 08/06/2019   CL 104 08/06/2019   CALCIUM 9.2 08/06/2019   MG 2.0 08/06/2019     Bone Lab Results  Component Value Date   25OHVITD1 49 08/06/2019   25OHVITD2 <1.0 08/06/2019   25OHVITD3 49 08/06/2019     Inflammation (CRP: Acute Phase) (ESR: Chronic Phase) Lab Results  Component Value Date   CRP <1 08/06/2019   ESRSEDRATE 2 08/06/2019       Note: Above Lab results reviewed.  Imaging  DG PAIN CLINIC C-ARM 1-60 MIN NO REPORT Fluoro was used, but no Radiologist interpretation will be provided.  Please refer to "NOTES" tab for provider progress note.  Assessment  The primary encounter diagnosis was Chronic pain syndrome. Diagnoses of Chronic low back pain (Primary Area of Pain) (Bilateral) (L>R), Chronic hip pain (Secondary area of Pain) (Bilateral) (L>R), Chronic lower extremity pain (Third area of Pain) (Bilateral) (L>R), and  Osteoarthritis involving multiple joints were also pertinent to this visit.  Plan of Care  Problem-specific:  No problem-specific Assessment & Plan notes found for this encounter.  Ricky Mercer has a current medication list which includes the following long-term medication(s): cholecalciferol, [START ON 08/20/2020] gabapentin, oxycodone, [START ON 04/18/2020] oxycodone, [START ON 05/18/2020] oxycodone, pantoprazole, simvastatin, and meloxicam.  Pharmacotherapy (Medications Ordered): Meds ordered this encounter  Medications  . meloxicam (MOBIC) 15 MG tablet    Sig: Take 1 tablet (15 mg total) by mouth daily.    Dispense:  30 tablet    Refill:  2    Do not add this medication to the electronic "Automatic Refill" notification system. Patient may have prescription filled one day early if pharmacy is closed on scheduled refill date.  . Turmeric 500 MG CAPS    Sig: Take 500 mg by mouth daily.    Dispense:  90 capsule    Refill:  0    OTC Recommendation. Please provide information on: where to find; how to take; side-effects; adverse reactions; drug-to-drug interactions; and contraindications. If unavailable, recommend similar substitute.   Orders:  No orders of the defined types were placed in this encounter.  Follow-up plan:   Return for keep appointment for radiofrequency ablation of the right cervical facets.      Considering:   Therapeutic right cervical facet RFA #1  Therapeutic right lumbar facet RFA #1  Therapeutic right SI joint RFA #1  Diagnostic bilateral Hipjoint injection  Diagnostic Caudal ESI + diagnostic epidurogram  Diagnostic right Knee injection  Diagnostic right Genicular NB  Diagnostic bilateral IA shoulderinjection  Diagnostic bilateral suprascapular NB    Palliative PRN treatment(s):   Palliative bilateral cervical facet block #2  Palliative right lumbar facet block #4  Palliative left lumbar facet block #4  Palliative right SI joint block #3   Palliative left SI joint  block #3  Palliative left lumbar facet RFA (last done on 03/21/2017)  Palliative left SI joint RFA (last done on 03/21/2017)  Therapeutic/palliative left CESI #4  Palliative left cervical facet RFA #2 (last done 03/11/2020)    Recent Visits Date Type Provider Dept  03/11/20 Procedure visit Milinda Pointer, MD [facility]  01/31/20 Procedure visit Milinda Pointer, MD [facility]  12/20/19 Telemedicine Milinda Pointer, MD [facility]  Showing recent visits within past 90 days and meeting all other requirements Today's Visits Date Type Provider Dept  03/19/20 Telemedicine Milinda Pointer, MD [facility]  Showing today's visits and meeting all other requirements Future Appointments Date Type Provider Dept  04/08/20 Appointment Milinda Pointer, MD [facility]  Showing future appointments within next 90 days and meeting all other requirements  I discussed the assessment and treatment plan with the patient. The patient was provided an opportunity to ask questions and all were answered. The patient agreed with the plan and demonstrated an understanding of the instructions.  Patient advised to call back or seek an in-person evaluation if the symptoms or condition worsens.  Duration of encounter: 21 minutes.  Note by: Gaspar Cola, MD Date: 03/19/2020; Time: 12:50 PM

## 2020-03-19 ENCOUNTER — Other Ambulatory Visit: Payer: Self-pay

## 2020-03-19 ENCOUNTER — Ambulatory Visit: Payer: Medicare Other | Attending: Pain Medicine | Admitting: Pain Medicine

## 2020-03-19 DIAGNOSIS — G894 Chronic pain syndrome: Secondary | ICD-10-CM | POA: Diagnosis not present

## 2020-03-19 DIAGNOSIS — M5442 Lumbago with sciatica, left side: Secondary | ICD-10-CM | POA: Diagnosis not present

## 2020-03-19 DIAGNOSIS — M8949 Other hypertrophic osteoarthropathy, multiple sites: Secondary | ICD-10-CM

## 2020-03-19 DIAGNOSIS — M79604 Pain in right leg: Secondary | ICD-10-CM

## 2020-03-19 DIAGNOSIS — M5441 Lumbago with sciatica, right side: Secondary | ICD-10-CM

## 2020-03-19 DIAGNOSIS — M25559 Pain in unspecified hip: Secondary | ICD-10-CM | POA: Diagnosis not present

## 2020-03-19 DIAGNOSIS — G8929 Other chronic pain: Secondary | ICD-10-CM

## 2020-03-19 DIAGNOSIS — M159 Polyosteoarthritis, unspecified: Secondary | ICD-10-CM | POA: Insufficient documentation

## 2020-03-19 DIAGNOSIS — M79605 Pain in left leg: Secondary | ICD-10-CM

## 2020-03-19 MED ORDER — MELOXICAM 15 MG PO TABS: 15.0000 mg | ORAL_TABLET | Freq: Every day | ORAL | 2 refills | Status: DC

## 2020-03-19 MED ORDER — TURMERIC 500 MG PO CAPS: 500.0000 mg | ORAL_CAPSULE | Freq: Every day | ORAL | 0 refills | Status: AC

## 2020-03-19 NOTE — Patient Instructions (Signed)
____________________________________________________________________________________________  Preparing for Procedure with Sedation  Procedure appointments are limited to planned procedures: . No Prescription Refills. . No disability issues will be discussed. . No medication changes will be discussed.  Instructions: . Oral Intake: Do not eat or drink anything for at least 8 hours prior to your procedure. (Exception: Blood Pressure Medication. See below.) . Transportation: Unless otherwise stated by your physician, you may drive yourself after the procedure. . Blood Pressure Medicine: Do not forget to take your blood pressure medicine with a sip of water the morning of the procedure. If your Diastolic (lower reading)is above 100 mmHg, elective cases will be cancelled/rescheduled. . Blood thinners: These will need to be stopped for procedures. Notify our staff if you are taking any blood thinners. Depending on which one you take, there will be specific instructions on how and when to stop it. . Diabetics on insulin: Notify the staff so that you can be scheduled 1st case in the morning. If your diabetes requires high dose insulin, take only  of your normal insulin dose the morning of the procedure and notify the staff that you have done so. . Preventing infections: Shower with an antibacterial soap the morning of your procedure. . Build-up your immune system: Take 1000 mg of Vitamin C with every meal (3 times a day) the day prior to your procedure. . Antibiotics: Inform the staff if you have a condition or reason that requires you to take antibiotics before dental procedures. . Pregnancy: If you are pregnant, call and cancel the procedure. . Sickness: If you have a cold, fever, or any active infections, call and cancel the procedure. . Arrival: You must be in the facility at least 30 minutes prior to your scheduled procedure. . Children: Do not bring children with you. . Dress appropriately:  Bring dark clothing that you would not mind if they get stained. . Valuables: Do not bring any jewelry or valuables.  Reasons to call and reschedule or cancel your procedure: (Following these recommendations will minimize the risk of a serious complication.) . Surgeries: Avoid having procedures within 2 weeks of any surgery. (Avoid for 2 weeks before or after any surgery). . Flu Shots: Avoid having procedures within 2 weeks of a flu shots or . (Avoid for 2 weeks before or after immunizations). . Barium: Avoid having a procedure within 7-10 days after having had a radiological study involving the use of radiological contrast. (Myelograms, Barium swallow or enema study). . Heart attacks: Avoid any elective procedures or surgeries for the initial 6 months after a "Myocardial Infarction" (Heart Attack). . Blood thinners: It is imperative that you stop these medications before procedures. Let us know if you if you take any blood thinner.  . Infection: Avoid procedures during or within two weeks of an infection (including chest colds or gastrointestinal problems). Symptoms associated with infections include: Localized redness, fever, chills, night sweats or profuse sweating, burning sensation when voiding, cough, congestion, stuffiness, runny nose, sore throat, diarrhea, nausea, vomiting, cold or Flu symptoms, recent or current infections. It is specially important if the infection is over the area that we intend to treat. . Heart and lung problems: Symptoms that may suggest an active cardiopulmonary problem include: cough, chest pain, breathing difficulties or shortness of breath, dizziness, ankle swelling, uncontrolled high or unusually low blood pressure, and/or palpitations. If you are experiencing any of these symptoms, cancel your procedure and contact your primary care physician for an evaluation.  Remember:  Regular Business hours are:    Monday to Thursday 8:00 AM to 4:00 PM  Provider's  Schedule: Tyrese Ficek, MD:  Procedure days: Tuesday and Thursday 7:30 AM to 4:00 PM  Bilal Lateef, MD:  Procedure days: Monday and Wednesday 7:30 AM to 4:00 PM ____________________________________________________________________________________________    

## 2020-03-20 ENCOUNTER — Other Ambulatory Visit
Admission: RE | Admit: 2020-03-20 | Discharge: 2020-03-20 | Disposition: A | Discharge status: Home / Self Care | Payer: Medicare Other | Source: Ambulatory Visit | Attending: Gastroenterology | Admitting: Gastroenterology

## 2020-03-20 ENCOUNTER — Ambulatory Visit: Payer: Medicare Other

## 2020-03-20 DIAGNOSIS — Z20822 Contact with and (suspected) exposure to covid-19: Secondary | ICD-10-CM | POA: Insufficient documentation

## 2020-03-20 DIAGNOSIS — Z01812 Encounter for preprocedural laboratory examination: Secondary | ICD-10-CM | POA: Insufficient documentation

## 2020-03-20 LAB — SARS CORONAVIRUS 2 (TAT 6-24 HRS): SARS Coronavirus 2: NEGATIVE

## 2020-03-21 ENCOUNTER — Encounter: Payer: Self-pay | Admitting: *Deleted

## 2020-03-24 ENCOUNTER — Encounter: Payer: Self-pay | Admitting: *Deleted

## 2020-03-24 ENCOUNTER — Ambulatory Visit
Admission: RE | Admit: 2020-03-24 | Discharge: 2020-03-24 | Disposition: A | Discharge status: Home / Self Care | Payer: Medicare Other | Attending: Gastroenterology | Admitting: Gastroenterology

## 2020-03-24 ENCOUNTER — Encounter
Admission: RE | Disposition: A | Discharge status: Home / Self Care | Payer: Self-pay | Source: Home / Self Care | Attending: Gastroenterology

## 2020-03-24 ENCOUNTER — Ambulatory Visit: Payer: Medicare Other | Admitting: Certified Registered"

## 2020-03-24 DIAGNOSIS — M1990 Unspecified osteoarthritis, unspecified site: Secondary | ICD-10-CM | POA: Insufficient documentation

## 2020-03-24 DIAGNOSIS — Z7982 Long term (current) use of aspirin: Secondary | ICD-10-CM | POA: Insufficient documentation

## 2020-03-24 DIAGNOSIS — Z79899 Other long term (current) drug therapy: Secondary | ICD-10-CM | POA: Insufficient documentation

## 2020-03-24 DIAGNOSIS — K219 Gastro-esophageal reflux disease without esophagitis: Secondary | ICD-10-CM | POA: Diagnosis not present

## 2020-03-24 DIAGNOSIS — E785 Hyperlipidemia, unspecified: Secondary | ICD-10-CM | POA: Insufficient documentation

## 2020-03-24 DIAGNOSIS — Q399 Congenital malformation of esophagus, unspecified: Secondary | ICD-10-CM | POA: Diagnosis not present

## 2020-03-24 DIAGNOSIS — R1310 Dysphagia, unspecified: Secondary | ICD-10-CM | POA: Insufficient documentation

## 2020-03-24 DIAGNOSIS — K3189 Other diseases of stomach and duodenum: Secondary | ICD-10-CM | POA: Insufficient documentation

## 2020-03-24 DIAGNOSIS — Z791 Long term (current) use of non-steroidal anti-inflammatories (NSAID): Secondary | ICD-10-CM | POA: Diagnosis not present

## 2020-03-24 DIAGNOSIS — G4730 Sleep apnea, unspecified: Secondary | ICD-10-CM | POA: Diagnosis not present

## 2020-03-24 DIAGNOSIS — Z8546 Personal history of malignant neoplasm of prostate: Secondary | ICD-10-CM | POA: Insufficient documentation

## 2020-03-24 DIAGNOSIS — I429 Cardiomyopathy, unspecified: Secondary | ICD-10-CM | POA: Insufficient documentation

## 2020-03-24 HISTORY — PX: ESOPHAGOGASTRODUODENOSCOPY: SHX5428

## 2020-03-24 SURGERY — EGD (ESOPHAGOGASTRODUODENOSCOPY)
Anesthesia: General

## 2020-03-24 MED ORDER — PROPOFOL 10 MG/ML IV BOLUS
INTRAVENOUS | Status: DC | PRN
  Administered 2020-03-24: 140 mg via INTRAVENOUS

## 2020-03-24 MED ORDER — SODIUM CHLORIDE 0.9 % IV SOLN
INTRAVENOUS | Status: DC
  Administered 2020-03-24: 1000 mL via INTRAVENOUS

## 2020-03-24 MED ORDER — GLYCOPYRROLATE 0.2 MG/ML IJ SOLN
INTRAMUSCULAR | Status: DC | PRN
  Administered 2020-03-24: .2 mg via INTRAVENOUS

## 2020-03-24 MED ORDER — PROPOFOL 10 MG/ML IV BOLUS
INTRAVENOUS | Status: AC
  Filled 2020-03-24: qty 40

## 2020-03-24 MED ORDER — LIDOCAINE HCL (PF) 2 % IJ SOLN
INTRAMUSCULAR | Status: AC
  Filled 2020-03-24: qty 5

## 2020-03-24 MED ORDER — LIDOCAINE HCL (CARDIAC) PF 100 MG/5ML IV SOSY
PREFILLED_SYRINGE | INTRAVENOUS | Status: DC | PRN
  Administered 2020-03-24: 100 mg via INTRAVENOUS

## 2020-03-24 MED ORDER — GLYCOPYRROLATE 0.2 MG/ML IJ SOLN
INTRAMUSCULAR | Status: AC
  Filled 2020-03-24: qty 1

## 2020-03-24 NOTE — Op Note (Addendum)
[facility] Gastroenterology Patient Name: Ricky Mercer Procedure Date: 03/24/2020 11:19 AM MRN: [id number] Account #: [id number] Date of Birth: Nov 18, 1950 Admit Type: Outpatient Age: 69 Room: [facility] ENDO ROOM 1 Gender: Male Note Status: Supervisor Override Procedure:             Upper GI endoscopy Indications:           Dysphagia Providers:             Andrey Farmer MD, MD Medicines:             Monitored Anesthesia Care Complications:         No immediate complications. Estimated blood loss:                         Minimal. Procedure:             Pre-Anesthesia Assessment:                        - Prior to the procedure, a History and Physical was                         performed, and patient medications and allergies were                         reviewed. The patient is competent. The risks and                         benefits of the procedure and the sedation options and                         risks were discussed with the patient. All questions                         were answered and informed consent was obtained.                         Patient identification and proposed procedure were                         verified by the physician, the nurse, the anesthetist                         and the technician in the endoscopy suite. Mental                         Status Examination: alert and oriented. Airway                         Examination: normal oropharyngeal airway and neck                         mobility. Respiratory Examination: clear to                         auscultation. CV Examination: normal. Prophylactic                         Antibiotics: The patient does not require prophylactic  antibiotics. Prior Anticoagulants: The patient has                         taken no previous anticoagulant or antiplatelet                         agents. ASA Grade Assessment: II - A patient with mild                         systemic  disease. After reviewing the risks and                         benefits, the patient was deemed in satisfactory                         condition to undergo the procedure. The anesthesia                         plan was to use monitored anesthesia care (MAC).                         Immediately prior to administration of medications,                         the patient was re-assessed for adequacy to receive                         sedatives. The heart rate, respiratory rate, oxygen                         saturations, blood pressure, adequacy of pulmonary                         ventilation, and response to care were monitored                         throughout the procedure. The physical status of the                         patient was re-assessed after the procedure.                        After obtaining informed consent, the endoscope was                         passed under direct vision. Throughout the procedure,                         the patient's blood pressure, pulse, and oxygen                         saturations were monitored continuously. The Endoscope                         was introduced through the mouth, and advanced to the                         second part of duodenum. The upper GI endoscopy was  accomplished without difficulty. The patient tolerated                         the procedure well. Findings:      The lower third of the esophagus was mildly tortuous.      Patchy mildly erythematous mucosa without bleeding was found in the       stomach. Estimated blood loss was minimal. Biopsies were taken with a       cold forceps for Helicobacter pylori testing.      The exam of the stomach was otherwise normal.      The examined duodenum was normal. Impression:            - Tortuous esophagus.                        - Erythematous mucosa in the stomach. Biopsied.                        - Normal examined duodenum. Recommendation:        -  Discharge patient to home.                        - Resume previous diet.                        - Continue present medications.                        - Await pathology results.                        - Consider ambulatory esophageal manometry if                         persistent symptoms.                        - Follow a presbyesophagus regimen. Procedure Code(s):     --- Professional ---                        [id number], Esophagogastroduodenoscopy, flexible,                         transoral; with biopsy, single or multiple Diagnosis Code(s):     --- Professional ---                        Q39.9, Congenital malformation of esophagus,                         unspecified                        K31.89, Other diseases of stomach and duodenum                        R13.10, Dysphagia, unspecified CPT copyright 2019 [employer]. All rights reserved. The codes documented in this report are preliminary and upon coder review may  be revised to meet current compliance requirements. Andrey Farmer, MD Andrey Farmer MD, MD 03/24/2020 12:55:02 PM Number of Addenda: 0 Note Initiated On: 03/24/2020 11:19 AM Estimated Blood  Loss:  Estimated blood loss was minimal.      [facility]

## 2020-03-24 NOTE — H&P (Signed)
Outpatient short stay form Pre-procedure 03/24/2020 12:27 PM Raylene Miyamoto MD, MPH  Primary Physician: Dr. Ellison Hughs  Reason for visit:  Dysphagia  History of present illness:  69 y/o gentleman with dysphagia mainly to solids here for EGD. No family history of GI malignancies. No blood thinners.    Current Facility-Administered Medications:    0.9 %  sodium chloride infusion, , Intravenous, Continuous, Murrell Dome, Hilton Cork, MD  Medications Prior to Admission  Medication Sig Dispense Refill Last Dose   aspirin 81 MG tablet Take 81 mg by mouth daily.   Past Week at Unknown time   buPROPion (WELLBUTRIN XL) 300 MG 24 hr tablet Take 300 mg by mouth daily.   Past Month at Unknown time   Cholecalciferol 125 MCG (5000 UT) capsule Take 1 capsule (5,000 Units total) by mouth daily. 90 capsule 3 Past Week at Unknown time   [START ON 08/20/2020] gabapentin (NEURONTIN) 400 MG capsule Take 1-2 capsules (400-800 mg total) by mouth at bedtime. 60 capsule 5 03/23/2020 at Unknown time   meloxicam (MOBIC) 15 MG tablet Take 1 tablet (15 mg total) by mouth daily. 30 tablet 2 03/23/2020 at Unknown time   oxyCODONE (OXY IR/ROXICODONE) 5 MG immediate release tablet Take 1 tablet (5 mg total) by mouth every 8 (eight) hours as needed for severe pain. Must last 30 days 90 tablet 0 03/23/2020 at Unknown time   [START ON 04/18/2020] oxyCODONE (OXY IR/ROXICODONE) 5 MG immediate release tablet Take 1 tablet (5 mg total) by mouth every 8 (eight) hours as needed for severe pain. Must last 30 days 90 tablet 0 03/23/2020 at Unknown time   [START ON 05/18/2020] oxyCODONE (OXY IR/ROXICODONE) 5 MG immediate release tablet Take 1 tablet (5 mg total) by mouth every 8 (eight) hours as needed for severe pain. Must last 30 days 90 tablet 0 03/23/2020 at Unknown time   pantoprazole (PROTONIX) 40 MG tablet Take 40 mg by mouth daily.   03/23/2020 at Unknown time   simvastatin (ZOCOR) 10 MG tablet TAKE 1 TABLET BY MOUTH EVERY DAY AT  NIGHT   03/23/2020 at Unknown time   Turmeric 500 MG CAPS Take 500 mg by mouth daily. 90 capsule 0 03/23/2020 at Unknown time   venlafaxine XR (EFFEXOR-XR) 150 MG 24 hr capsule Take 150 mg by mouth daily with breakfast.   Past Month at Unknown time     No Known Allergies   Past Medical History:  Diagnosis Date   Acute postoperative pain 03/21/2017   Bilateral arm pain    Cardiomyopathy ([place])    Diverticulosis    Dupuytren's contracture    Erosive gastritis    Foot pain, bilateral    GERD (gastroesophageal reflux disease)    Hand weakness    Hyperlipidemia    Knee pain    Midline low back pain    Osteoarthritis    neck and back   Pain in neck    hurts to turn side to side   Prostate cancer (HCC)    prostate   Reactive airway disease    Seasonal allergies    Situational anxiety    Sleep apnea    CPAP - only uses "sometimes"    Review of systems:  Otherwise negative.    Physical Exam  Gen: Alert, oriented. Appears stated age.  HEENT: [place]/AT Lungs: no respiratory distress CV: RR nl S1, S2. Abd: soft, benign, no masses Ext: No edema    Planned procedures: Proceed with EGD. The patient understands the nature  of the planned procedure, indications, risks, alternatives and potential complications including but not limited to bleeding, infection, perforation, damage to internal organs and possible oversedation/side effects from anesthesia. The patient agrees and gives consent to proceed.  Please refer to procedure notes for findings, recommendations and patient disposition/instructions.     Raylene Miyamoto MD, MPH Gastroenterology 03/24/2020  12:27 PM

## 2020-03-24 NOTE — Interval H&P Note (Signed)
History and Physical Interval Note:  03/24/2020 12:29 PM  Ricky Mercer  has presented today for surgery, with the diagnosis of EPIGASTRIC PAIN,DYSPHAGIA.  The various methods of treatment have been discussed with the patient and family. After consideration of risks, benefits and other options for treatment, the patient has consented to  Procedure(s): ESOPHAGOGASTRODUODENOSCOPY (EGD) (N/A) as a surgical intervention.  The patient's history has been reviewed, patient examined, no change in status, stable for surgery.  I have reviewed the patient's chart and labs.  Questions were answered to the patient's satisfaction.     [name]  Ok to proceed with EGD.

## 2020-03-24 NOTE — Anesthesia Preprocedure Evaluation (Signed)
Anesthesia Evaluation  Patient identified by MRN, date of birth, ID band Patient awake    Reviewed: Allergy & Precautions, NPO status , Patient's Chart, lab work & pertinent test results  History of Anesthesia Complications Negative for: history of anesthetic complications  Airway Mallampati: III  TM Distance: >3 FB Neck ROM: Full    Dental  (+) Chipped, Poor Dentition, Dental Advidsory Given   Pulmonary neg shortness of breath, sleep apnea (has CPAP prescribed but does not wear it) , neg COPD, neg recent URI,    breath sounds clear to auscultation- rhonchi (-) wheezing      Cardiovascular Exercise Tolerance: Good (-) hypertension(-) angina(-) CAD, (-) Past MI, (-) Cardiac Stents and (-) CABG negative cardio ROS   Rhythm:Regular Rate:Normal - Systolic murmurs and - Diastolic murmurs    Neuro/Psych neg Seizures PSYCHIATRIC DISORDERS Anxiety Depression negative neurological ROS     GI/Hepatic Neg liver ROS, PUD, GERD  Medicated,  Endo/Other  negative endocrine ROSneg diabetes  Renal/GU negative Renal ROS     Musculoskeletal  (+) Arthritis ,   Abdominal (+) - obese,   Peds  Hematology negative hematology ROS (+)   Anesthesia Other Findings Past Medical History: No date: Bilateral arm pain No date: Cardiomyopathy ([place]) No date: Diverticulosis No date: Dupuytren's contracture No date: Erosive gastritis No date: Foot pain, bilateral No date: GERD (gastroesophageal reflux disease) No date: Hand weakness No date: Hyperlipidemia No date: Knee pain No date: Midline low back pain No date: Osteoarthritis     Comment:  neck and back No date: Pain in neck     Comment:  hurts to turn side to side No date: Prostate cancer (HCC)     Comment:  prostate No date: Reactive airway disease No date: Seasonal allergies No date: Situational anxiety No date: Sleep apnea     Comment:  CPAP - only uses "sometimes"    Reproductive/Obstetrics                             Anesthesia Physical  Anesthesia Plan  ASA: II  Anesthesia Plan: General   Post-op Pain Management:    Induction: Intravenous  PONV Risk Score and Plan: 1 and Propofol infusion and TIVA  Airway Management Planned: Natural Airway and Nasal Cannula  Additional Equipment:   Intra-op Plan:   Post-operative Plan:   Informed Consent: I have reviewed the patients History and Physical, chart, labs and discussed the procedure including the risks, benefits and alternatives for the proposed anesthesia with the patient or authorized representative who has indicated his/her understanding and acceptance.     Dental advisory given  Plan Discussed with: CRNA and Anesthesiologist  Anesthesia Plan Comments:         Anesthesia Quick Evaluation

## 2020-03-24 NOTE — Transfer of Care (Signed)
Immediate Anesthesia Transfer of Care Note  Patient: Ricky Mercer  Procedure(s) Performed: ESOPHAGOGASTRODUODENOSCOPY (EGD) (N/A )  Patient Location: PACU  Anesthesia Type:General  Level of Consciousness: sedated  Airway & Oxygen Therapy: Patient Spontanous Breathing and Patient connected to nasal cannula oxygen  Post-op Assessment: Report given to RN and Post -op Vital signs reviewed and stable  Post vital signs: Reviewed and stable  Last Vitals:  Vitals Value Taken Time  BP 89/65 03/24/20 1252  Temp    Pulse 62 03/24/20 1252  Resp 11 03/24/20 1252  SpO2 99 % 03/24/20 1252  Vitals shown include unvalidated device data.  Last Pain:  Vitals:   03/24/20 1208  TempSrc: Temporal  PainSc: 6          Complications: No complications documented.

## 2020-03-25 ENCOUNTER — Encounter: Payer: Self-pay | Admitting: Gastroenterology

## 2020-03-25 LAB — SURGICAL PATHOLOGY

## 2020-03-25 NOTE — Anesthesia Postprocedure Evaluation (Signed)
Anesthesia Post Note  Patient: Ricky Mercer  Procedure(s) Performed: ESOPHAGOGASTRODUODENOSCOPY (EGD) (N/A )  Patient location during evaluation: Endoscopy Anesthesia Type: General Level of consciousness: awake and alert Pain management: pain level controlled Vital Signs Assessment: post-procedure vital signs reviewed and stable Respiratory status: spontaneous breathing, nonlabored ventilation, respiratory function stable and patient connected to nasal cannula oxygen Cardiovascular status: blood pressure returned to baseline and stable Postop Assessment: no apparent nausea or vomiting Anesthetic complications: no   No complications documented.   Last Vitals:  Vitals:   03/24/20 1323 03/24/20 1333  BP: 134/84   Pulse: (!) 55 (!) 55  Resp: 10 15  Temp:    SpO2: 100% 100%    Last Pain:  Vitals:   03/25/20 0746  TempSrc:   PainSc: 0-No pain                 Martha Clan

## 2020-03-26 DIAGNOSIS — G8929 Other chronic pain: Secondary | ICD-10-CM | POA: Insufficient documentation

## 2020-03-26 DIAGNOSIS — R52 Pain, unspecified: Secondary | ICD-10-CM | POA: Insufficient documentation

## 2020-03-26 DIAGNOSIS — M549 Dorsalgia, unspecified: Secondary | ICD-10-CM | POA: Insufficient documentation

## 2020-04-08 ENCOUNTER — Ambulatory Visit: Payer: Medicare Other | Admitting: Pain Medicine

## 2020-04-08 NOTE — Progress Notes (Deleted)
No show

## 2020-05-30 ENCOUNTER — Ambulatory Visit
Admission: RE | Admit: 2020-05-30 | Discharge: 2020-05-30 | Disposition: A | Discharge status: Home / Self Care | Payer: Medicare Other | Source: Ambulatory Visit | Attending: Gastroenterology | Admitting: Gastroenterology

## 2020-05-30 ENCOUNTER — Other Ambulatory Visit: Payer: Self-pay

## 2020-05-30 DIAGNOSIS — R1013 Epigastric pain: Secondary | ICD-10-CM | POA: Diagnosis present

## 2020-06-02 ENCOUNTER — Other Ambulatory Visit: Payer: Self-pay | Admitting: Gastroenterology

## 2020-06-02 DIAGNOSIS — R1033 Periumbilical pain: Secondary | ICD-10-CM

## 2020-06-04 ENCOUNTER — Other Ambulatory Visit: Payer: Self-pay | Admitting: Neurology

## 2020-06-04 DIAGNOSIS — G8929 Other chronic pain: Secondary | ICD-10-CM

## 2020-06-05 ENCOUNTER — Other Ambulatory Visit: Payer: Self-pay

## 2020-06-05 ENCOUNTER — Ambulatory Visit
Admission: RE | Admit: 2020-06-05 | Discharge: 2020-06-05 | Disposition: A | Discharge status: Home / Self Care | Payer: Medicare Other | Source: Ambulatory Visit | Attending: Gastroenterology | Admitting: Gastroenterology

## 2020-06-05 DIAGNOSIS — R1033 Periumbilical pain: Secondary | ICD-10-CM | POA: Diagnosis not present

## 2020-06-05 MED ORDER — IOHEXOL 300 MG/ML  SOLN
100.0000 mL | Freq: Once | INTRAMUSCULAR | Status: AC | PRN
  Administered 2020-06-05: 100 mL via INTRAVENOUS

## 2020-06-20 ENCOUNTER — Ambulatory Visit: Payer: Medicare Other

## 2020-06-20 ENCOUNTER — Other Ambulatory Visit: Payer: Self-pay | Admitting: Pain Medicine

## 2020-06-20 ENCOUNTER — Ambulatory Visit
Admission: RE | Admit: 2020-06-20 | Discharge: 2020-06-20 | Disposition: A | Discharge status: Home / Self Care | Payer: Medicare Other | Source: Ambulatory Visit | Attending: Neurology | Admitting: Neurology

## 2020-06-20 ENCOUNTER — Other Ambulatory Visit: Payer: Self-pay

## 2020-06-20 DIAGNOSIS — M549 Dorsalgia, unspecified: Secondary | ICD-10-CM | POA: Insufficient documentation

## 2020-06-20 DIAGNOSIS — G8929 Other chronic pain: Secondary | ICD-10-CM | POA: Insufficient documentation

## 2020-06-20 DIAGNOSIS — M5442 Lumbago with sciatica, left side: Secondary | ICD-10-CM

## 2020-06-20 DIAGNOSIS — M159 Polyosteoarthritis, unspecified: Secondary | ICD-10-CM

## 2020-06-24 NOTE — Progress Notes (Signed)
PROVIDER NOTE: Information contained herein reflects review and annotations entered in association with encounter. Interpretation of such information and data should be left to medically-trained personnel. Information provided to patient can be located elsewhere in the medical record under "Patient Instructions". Document created using STT-dictation technology, any transcriptional errors that may result from process are unintentional.    Patient: Ricky Mercer  Service Category: E/M  Provider: Gaspar Cola, MD  DOB: 1951/05/20  DOS: 06/25/2020  Specialty: Interventional Pain Management  MRN: [id number]  Setting: Ambulatory outpatient  PCP: Sofie Hartigan, MD  Type: Established Patient    Referring Provider: Sofie Hartigan, MD  Location: Office  Delivery: Face-to-face     HPI  Mr. LAN MCNEILL, a 69 y.o. year old male, is here today because of his No primary diagnosis found.. Mr. Mamula primary complain today is Neck Pain Last encounter: My last encounter with him was on 06/20/2020. Pertinent problems: Mr. Hoar has Hand weakness; Polyradiculopathy; Chronic low back pain (Primary Area of Pain) (Bilateral) (L>R); Chronic lower extremity pain (Third area of Pain) (Bilateral) (L>R); Chronic lumbar radicular pain (Bilateral) (L>R) (Bilateral S1 dermatomal distribution); Chronic hip pain (Secondary area of Pain) (Bilateral) (L>R); Chronic upper back pain (Bilateral) (L>R); Chronic neck pain (neck<shoulder) (L>R); Chronic foot pain; Chronic knee pain (Right); Cervical spondylosis; 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); Lumbar spondylosis; Cervical facet hypertrophy; Cervical facet syndrome (R>L); History of prostate cancer; Radicular pain of shoulder; Peripheral neuropathy (HCC) (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); Lumbar facet syndrome (Bilateral) (L>R); Chronic shoulder pain (Bilateral) (L>R); Chronic sacroiliac joint pain (Bilateral) (L>R); Chronic  musculoskeletal pain; Pain of right foot; Neurogenic pain; Chronic pain syndrome; Bilateral arm pain; Acute postoperative pain; DDD (degenerative disc disease), cervical; DDD (degenerative disc disease), lumbosacral; Cervicogenic headache; Cervico-occipital neuralgia; Osteoarthritis of spine with radiculopathy, lumbar region; Decreased sensation; Numbness and tingling; Weakness of left leg; Bilateral leg and foot pain; Cervicalgia; Chronic upper extremity pain (Bilateral) (L>R); Abnormal MRI, cervical spine (12/07/2019); Burning sensation of feet; Numbness and tingling of both legs; Spondylosis without myelopathy or radiculopathy, cervical region; and Osteoarthritis involving multiple joints on their pertinent problem list. Pain Assessment: Severity of Chronic pain is reported as a 6 /10. Location: Neck  /head. Onset: More than a month ago. Quality: Burning, Tingling. Timing: Constant. Modifying factor(s): Oxycodone, Gabapentin. Vitals:  height is 5' 9" (1.753 m) and weight is 170 lb (77.1 kg). His temporal temperature is 97.9 F (36.6 C). His blood pressure is 132/76 and his pulse is 85. His respiration is 16 and oxygen saturation is 99%.   Reason for encounter: medication management.  The patient indicates doing well with the current medication regimen. No adverse reactions or side effects reported to the medications.  Today the patient had me in the room for an extended period time asked me questions that were more geared towards things that belong in the realm of the primary care physician and perhaps his hands hurt.  He was complaining of pain in the hands and he clearly still has some trigger finger.  He has had surgery for them, but apparently he was not aware that he can recur.  I suggested that he talk to his hand surgeon and see what is it that they can offer him.  In addition, he went on to asking me about medications and his stomach problems.  We talked about his Protonix, which I do not prescribe  for him, since he had some questions about that.  He also asked me about his sleep apnea, and he is lack of energy.  Again I referred him to the primary care physician after I gave him some explanations as to why it was important that he treated those. RTCB: 09/23/2020 Transfer nonopioids: Cholecalciferol, gabapentin, meloxicam.  Pharmacotherapy Assessment   Analgesic: Oxycodone 5 mg, 1 tab PO q 8 hrs (15 mg/day of oxycodone) MME/day: 22.5 mg/day.   Monitoring: [place] PMP: PDMP reviewed during this encounter.       Pharmacotherapy: No side-effects or adverse reactions reported. Compliance: No problems identified. Effectiveness: Clinically acceptable.  Landis Martins, RN  06/25/2020  9:40 AM  Sign when Signing Visit Nursing Pain Medication Assessment:  Safety precautions to be maintained throughout the outpatient stay will include: orient to surroundings, keep bed in low position, maintain call bell within reach at all times, provide assistance with transfer out of bed and ambulation.  Medication Inspection Compliance: Pill count conducted under aseptic conditions, in front of the patient. Neither the pills nor the bottle was removed from the patient's sight at any time. Once count was completed pills were immediately returned to the patient in their original bottle.  Medication: Oxycodone IR Pill/Patch Count: 23 of 90 pills remain Pill/Patch Appearance: Markings consistent with prescribed medication Bottle Appearance: Standard pharmacy container. Clearly labeled. Filled Date: 05/31/2020 Last Medication intake:  Today    UDS:  Summary  Date Value Ref Range Status  03/11/2020 Note  Corrected    Comment:    ==================================================================== [employer] (MW) ==================================================================== Test                             Result       Flag       Units  Drug Present and Declared for Prescription  Verification   Oxycodone                      97           EXPECTED   ng/mg creat   Oxymorphone                    930          EXPECTED   ng/mg creat   Noroxycodone                   601          EXPECTED   ng/mg creat   Noroxymorphone                 369          EXPECTED   ng/mg creat    Sources of oxycodone are scheduled prescription medications.    Oxymorphone, noroxycodone, and noroxymorphone are expected    metabolites of oxycodone. Oxymorphone is also available as a    scheduled prescription medication.  Drug Present not Declared for Prescription Verification   Oxazepam                       12           UNEXPECTED ng/mg creat    Oxazepam may be administered as a scheduled prescription medication;    it is also an expected metabolite of other benzodiazepine drugs,    including diazepam, chlordiazepoxide, prazepam, clorazepate,    halazepam, and temazepam.  Drug Absent but Declared for Prescription Verification   Hydrocodone  Not Detected UNEXPECTED ng/mg creat ==================================================================== Test                      Result    Flag   Units      Ref Range   Creatinine              227              mg/dL      >=20 ==================================================================== Declared Medications:  The flagging and interpretation on this report are based on the  following declared medications.  Unexpected results may arise from  inaccuracies in the declared medications.   **Note: The testing scope of this panel includes these medications:   Hydrocodone (Norco)  Oxycodone (Roxicodone)   **Note: The testing scope of this panel does not include the  following reported medications:   Acetaminophen (Norco)  Aspirin  Cholecalciferol  Gabapentin (Neurontin)  Pantoprazole (Protonix)  Simvastatin (Zocor) ==================================================================== For clinical consultation, please call [phone]. ====================================================================      ROS  Constitutional: Denies any fever or chills Gastrointestinal: No reported hemesis, hematochezia, vomiting, or acute GI distress Musculoskeletal: Denies any acute onset joint swelling, redness, loss of ROM, or weakness Neurological: No reported episodes of acute onset apraxia, aphasia, dysarthria, agnosia, amnesia, paralysis, loss of coordination, or loss of consciousness  Medication Review  Cholecalciferol, aspirin, buPROPion, gabapentin, meloxicam, oxyCODONE, pantoprazole, and simvastatin  History Review  Allergy: Mr. Krenn has No Known Allergies. Drug: Mr. Juncaj  reports no history of drug use. Alcohol:  reports no history of alcohol use. Tobacco:  reports that he has never smoked. He has never used smokeless tobacco. Social: Mr. Fessel  reports that he has never smoked. He has never used smokeless tobacco. He reports that he does not drink alcohol and does not use drugs. Medical:  has a past medical history of Acute postoperative pain (03/21/2017), Bilateral arm pain, Cardiomyopathy ([place]), Diverticulosis, Dupuytren's contracture, Erosive gastritis, Foot pain, bilateral, GERD (gastroesophageal reflux disease), Hand weakness, Hyperlipidemia, Knee pain, Midline low back pain, Osteoarthritis, Pain in neck, Prostate cancer ([place]), Reactive airway disease, Seasonal allergies, Situational anxiety, and Sleep apnea. Surgical: Mr. Champlain  has a past surgical history that includes Nose surgery; Hand surgery (Bilateral); Colonoscopy (10/24/2012); Upper gi endoscopy (12/18/2012); Tonsillectomy (2009); Skin cancer excision (Left, 2015); Mass excision (Right, 07/18/2015); Esophagogastroduodenoscopy (egd) with propofol (N/A, 10/28/2015); prostate seeding; Hernia repair; Cholecystectomy (N/A, 12/02/2015); Esophagogastroduodenoscopy (N/A, 11/17/2017); Colonoscopy with propofol (N/A, 11/17/2017); and Esophagogastroduodenoscopy  (N/A, 03/24/2020). Family: family history includes Colon cancer in his brother; Hyperlipidemia in his sister; Lung cancer in his father; Prostate cancer in his brother.  Laboratory Chemistry Profile   Renal Lab Results  Component Value Date   BUN 11 08/06/2019   CREATININE 0.93 08/06/2019   BCR 12 08/06/2019   GFRAA 99 08/06/2019   GFRNONAA 86 08/06/2019     Hepatic Lab Results  Component Value Date   AST 23 08/06/2019   ALT 41 11/25/2015   ALBUMIN 4.3 08/06/2019   ALKPHOS 72 08/06/2019     Electrolytes Lab Results  Component Value Date   NA 142 08/06/2019   K 4.3 08/06/2019   CL 104 08/06/2019   CALCIUM 9.2 08/06/2019   MG 2.0 08/06/2019     Bone Lab Results  Component Value Date   25OHVITD1 49 08/06/2019   25OHVITD2 <1.0 08/06/2019   25OHVITD3 49 08/06/2019     Inflammation (CRP: Acute Phase) (ESR: Chronic Phase) Lab Results  Component Value Date  CRP <1 08/06/2019   ESRSEDRATE 2 08/06/2019       Note: Above Lab results reviewed.  Recent Imaging Review  MR THORACIC SPINE WO CONTRAST CLINICAL DATA:  Generalized back pain progressive over the last 6 years.  EXAM: MRI THORACIC SPINE WITHOUT CONTRAST  TECHNIQUE: Multiplanar, multisequence MR imaging of the thoracic spine was performed. No intravenous contrast was administered.  COMPARISON:  04/10/2018  FINDINGS: Alignment:  No malalignment.  Vertebrae: No fracture or primary bone lesion. Ordinary small endplate Schmorl's nodes without reactive edema. Mild heterogeneous marrow signal with some scattered benign appearing fat.  Cord:  No cord compression or primary cord lesion.  Paraspinal and other soft tissues: Negative  Disc levels:  No significant disc pathology in the thoracic region. Mild age related degenerative desiccation but no bulge or herniation. No stenosis of the canal or foramina. Ordinary mild lower thoracic facet osteoarthritis without advanced finding, joint effusion  or edema. Minimal costovertebral degenerative changes, also without edema.  IMPRESSION: No cause of the presenting symptoms is identified. Ordinary mild age related degenerative changes without evidence of disc herniation, stenosis or neural compression. No evidence of painful facet arthropathy or costovertebral arthropathy. No appreciable change since the study of 2019.  Electronically Signed   By: Nelson Chimes M.D.   On: 06/21/2020 07:58 Note: Reviewed        Physical Exam  General appearance: Well nourished, well developed, and well hydrated. In no apparent acute distress Mental status: Alert, oriented x 3 (person, place, & time)       Respiratory: No evidence of acute respiratory distress Eyes: PERLA Vitals: BP 132/76   Pulse 85   Temp 97.9 F (36.6 C) (Temporal)   Resp 16   Ht 5' 9" (1.753 m)   Wt 170 lb (77.1 kg)   SpO2 99%   BMI 25.10 kg/m  BMI: Estimated body mass index is 25.1 kg/m as calculated from the following:   Height as of this encounter: 5' 9" (1.753 m).   Weight as of this encounter: 170 lb (77.1 kg). Ideal: Ideal body weight: 70.7 kg (155 lb 13.8 oz) Adjusted ideal body weight: 73.3 kg (161 lb 8.3 oz)  Assessment   Status Diagnosis  Controlled Controlled Controlled 1. Chronic low back pain (Primary Area of Pain) (Bilateral) (L>R)   2. Osteoarthritis involving multiple joints   3. Chronic pain syndrome   4. Neurogenic pain   5. Vitamin D insufficiency      Updated Problems: No problems updated.  Plan of Care  Problem-specific:  No problem-specific Assessment & Plan notes found for this encounter.  Mr. COSTA JHA has a current medication list which includes the following long-term medication(s): bupropion, pantoprazole, simvastatin, cholecalciferol, [START ON 08/20/2020] gabapentin, meloxicam, oxycodone, [START ON 07/25/2020] oxycodone, and [START ON 08/24/2020] oxycodone.  Pharmacotherapy (Medications Ordered): Meds ordered this  encounter  Medications  . meloxicam (MOBIC) 15 MG tablet    Sig: Take 1 tablet (15 mg total) by mouth daily.    Dispense:  30 tablet    Refill:  2    Do not add this medication to the electronic "Automatic Refill" notification system. Patient may have prescription filled one day early if pharmacy is closed on scheduled refill date.  Marland Kitchen oxyCODONE (OXY IR/ROXICODONE) 5 MG immediate release tablet    Sig: Take 1 tablet (5 mg total) by mouth every 8 (eight) hours as needed for severe pain. Must last 30 days    Dispense:  90 tablet  Refill:  0    Chronic Pain: STOP Act (Not applicable) Fill 1 day early if closed on refill date. Avoid benzodiazepines within 8 hours of opioids  . gabapentin (NEURONTIN) 400 MG capsule    Sig: Take 1-2 capsules (400-800 mg total) by mouth at bedtime.    Dispense:  60 capsule    Refill:  2    Fill one day early if pharmacy is closed on scheduled refill date. May substitute for generic if available.  . Cholecalciferol 125 MCG (5000 UT) capsule    Sig: Take 1 capsule (5,000 Units total) by mouth daily.    Dispense:  90 capsule    Refill:  0    Fill one day early if pharmacy is closed on scheduled refill date. May substitute for generic if available.  Marland Kitchen oxyCODONE (OXY IR/ROXICODONE) 5 MG immediate release tablet    Sig: Take 1 tablet (5 mg total) by mouth every 8 (eight) hours as needed for severe pain. Must last 30 days    Dispense:  90 tablet    Refill:  0    Chronic Pain: STOP Act (Not applicable) Fill 1 day early if closed on refill date. Avoid benzodiazepines within 8 hours of opioids  . oxyCODONE (OXY IR/ROXICODONE) 5 MG immediate release tablet    Sig: Take 1 tablet (5 mg total) by mouth every 8 (eight) hours as needed for severe pain. Must last 30 days    Dispense:  90 tablet    Refill:  0    Chronic Pain: STOP Act (Not applicable) Fill 1 day early if closed on refill date. Avoid benzodiazepines within 8 hours of opioids   Orders:  No orders of the  defined types were placed in this encounter.  Follow-up plan:   Return in about 3 months (around 09/23/2020) for (F2F), (Med Mgmt).      Considering:   Therapeutic right cervical facet RFA #1  Therapeutic right lumbar facet RFA #1  Therapeutic right SI joint RFA #1  Diagnostic bilateral Hipjoint injection  Diagnostic Caudal ESI + diagnostic epidurogram  Diagnostic right Knee injection  Diagnostic right Genicular NB  Diagnostic bilateral IA shoulderinjection  Diagnostic bilateral suprascapular NB    Palliative PRN treatment(s):   Palliative bilateral cervical facet block #2  Palliative right lumbar facet block #4  Palliative left lumbar facet block #4  Palliative right SI joint block #3  Palliative left SI joint block #3  Palliative left lumbar facet RFA (last done on 03/21/2017)  Palliative left SI joint RFA (last done on 03/21/2017)  Therapeutic/palliative left CESI #4  Palliative left cervical facet RFA #2 (last done 03/11/2020)     Recent Visits No visits were found meeting these conditions. Showing recent visits within past 90 days and meeting all other requirements Today's Visits Date Type Provider Dept  06/25/20 Office Visit Milinda Pointer, MD [facility]  Showing today's visits and meeting all other requirements Future Appointments Date Type Provider Dept  09/17/20 Appointment Milinda Pointer, MD [facility]  Showing future appointments within next 90 days and meeting all other requirements  I discussed the assessment and treatment plan with the patient. The patient was provided an opportunity to ask questions and all were answered. The patient agreed with the plan and demonstrated an understanding of the instructions.  Patient advised to call back or seek an in-person evaluation if the symptoms or condition worsens.  Duration of encounter: 45 minutes.  Note by: Gaspar Cola, MD Date: 06/25/2020;  Time: 10:54 AM

## 2020-06-25 ENCOUNTER — Ambulatory Visit: Payer: Medicare Other | Attending: Pain Medicine | Admitting: Pain Medicine

## 2020-06-25 ENCOUNTER — Encounter: Payer: Self-pay | Admitting: Pain Medicine

## 2020-06-25 ENCOUNTER — Other Ambulatory Visit: Payer: Self-pay

## 2020-06-25 DIAGNOSIS — E559 Vitamin D deficiency, unspecified: Secondary | ICD-10-CM | POA: Diagnosis present

## 2020-06-25 DIAGNOSIS — G8929 Other chronic pain: Secondary | ICD-10-CM

## 2020-06-25 DIAGNOSIS — M8949 Other hypertrophic osteoarthropathy, multiple sites: Secondary | ICD-10-CM | POA: Insufficient documentation

## 2020-06-25 DIAGNOSIS — M5441 Lumbago with sciatica, right side: Secondary | ICD-10-CM | POA: Diagnosis not present

## 2020-06-25 DIAGNOSIS — M792 Neuralgia and neuritis, unspecified: Secondary | ICD-10-CM | POA: Diagnosis present

## 2020-06-25 DIAGNOSIS — G894 Chronic pain syndrome: Secondary | ICD-10-CM | POA: Diagnosis present

## 2020-06-25 DIAGNOSIS — M159 Polyosteoarthritis, unspecified: Secondary | ICD-10-CM

## 2020-06-25 DIAGNOSIS — M5442 Lumbago with sciatica, left side: Secondary | ICD-10-CM | POA: Diagnosis present

## 2020-06-25 MED ORDER — MELOXICAM 15 MG PO TABS: 15.0000 mg | ORAL_TABLET | Freq: Every day | ORAL | 2 refills | Status: DC

## 2020-06-25 MED ORDER — CHOLECALCIFEROL 125 MCG (5000 UT) PO CAPS: 5000.0000 [IU] | ORAL_CAPSULE | Freq: Every day | ORAL | 0 refills | Status: DC

## 2020-06-25 MED ORDER — OXYCODONE HCL 5 MG PO TABS: 5.0000 mg | ORAL_TABLET | Freq: Three times a day (TID) | ORAL | 0 refills | Status: DC | PRN

## 2020-06-25 MED ORDER — GABAPENTIN 400 MG PO CAPS: 400.0000 mg | ORAL_CAPSULE | Freq: Every day | ORAL | 2 refills | Status: DC

## 2020-06-25 NOTE — Progress Notes (Signed)
Nursing Pain Medication Assessment:  Safety precautions to be maintained throughout the outpatient stay will include: orient to surroundings, keep bed in low position, maintain call bell within reach at all times, provide assistance with transfer out of bed and ambulation.  Medication Inspection Compliance: Pill count conducted under aseptic conditions, in front of the patient. Neither the pills nor the bottle was removed from the patient's sight at any time. Once count was completed pills were immediately returned to the patient in their original bottle.  Medication: Oxycodone IR Pill/Patch Count: 23 of 90 pills remain Pill/Patch Appearance: Markings consistent with prescribed medication Bottle Appearance: Standard pharmacy container. Clearly labeled. Filled Date: 05/31/2020 Last Medication intake:  Today

## 2020-08-04 ENCOUNTER — Other Ambulatory Visit: Payer: Self-pay

## 2020-08-04 ENCOUNTER — Ambulatory Visit (INDEPENDENT_AMBULATORY_CARE_PROVIDER_SITE_OTHER): Payer: Medicare Other

## 2020-08-04 ENCOUNTER — Ambulatory Visit
Admission: EM | Admit: 2020-08-04 | Discharge: 2020-08-04 | Disposition: A | Discharge status: Home / Self Care | Payer: Medicare Other | Attending: Emergency Medicine | Admitting: Emergency Medicine

## 2020-08-04 ENCOUNTER — Encounter: Payer: Self-pay | Admitting: Emergency Medicine

## 2020-08-04 DIAGNOSIS — R0781 Pleurodynia: Secondary | ICD-10-CM | POA: Diagnosis not present

## 2020-08-04 DIAGNOSIS — R0602 Shortness of breath: Secondary | ICD-10-CM

## 2020-08-04 DIAGNOSIS — R0689 Other abnormalities of breathing: Secondary | ICD-10-CM | POA: Diagnosis present

## 2020-08-04 DIAGNOSIS — R079 Chest pain, unspecified: Secondary | ICD-10-CM | POA: Diagnosis not present

## 2020-08-04 DIAGNOSIS — M791 Myalgia, unspecified site: Secondary | ICD-10-CM

## 2020-08-04 DIAGNOSIS — R1011 Right upper quadrant pain: Secondary | ICD-10-CM | POA: Diagnosis not present

## 2020-08-04 LAB — COMPREHENSIVE METABOLIC PANEL
ALT: 15 U/L (ref 0–44)
AST: 20 U/L (ref 15–41)
Albumin: 3.8 g/dL (ref 3.5–5.0)
Alkaline Phosphatase: 47 U/L (ref 38–126)
Anion gap: 6 (ref 5–15)
BUN: 11 mg/dL (ref 8–23)
CO2: 31 mmol/L (ref 22–32)
Calcium: 8.8 mg/dL — ABNORMAL LOW (ref 8.9–10.3)
Chloride: 102 mmol/L (ref 98–111)
Creatinine, Ser: 0.99 mg/dL (ref 0.61–1.24)
GFR, Estimated: >60 mL/min (ref >60)
Glucose, Bld: 106 mg/dL — ABNORMAL HIGH (ref 70–99)
Potassium: 4.3 mmol/L (ref 3.5–5.1)
Sodium: 139 mmol/L (ref 135–145)
Total Bilirubin: 0.4 mg/dL (ref 0.3–1.2)
Total Protein: 7 g/dL (ref 6.5–8.1)

## 2020-08-04 LAB — LIPASE, BLOOD: Lipase: 24 U/L (ref 11–51)

## 2020-08-04 NOTE — ED Provider Notes (Signed)
[facility]    CSN: [id number] Arrival date & time: 08/04/20  0623      History   Chief Complaint Chief Complaint  Patient presents with   Muscle Pain    right rib area    HPI Ricky Mercer is a 69 y.o. male.   Ricky Mercer presents with complaints of right sided rib, RUQ abdominal discomfort as well as a change in breathing pattern, almost a shallow "catch" to his breath. This has been ongoing for months now. Occurs maybe 3 times a day. Notes it worse if he is laying down or sitting on his recliner. Doesn't wake him at night. No other shortness of breath. No cough. No other chest pain. No injury. On chart review he did see his PCP with similar complaint 03/31/2020, negative chest/rib series xray at that time.  History of chronic pain, gerd, sleep apnea, reactive airway, cardiomyopathy. History of cholecystectomy. He does take protonix. Doesn't smoke. Denies any alcohol use.     ROS per HPI, negative if not otherwise mentioned.      Past Medical History:  Diagnosis Date   Acute postoperative pain 03/21/2017   Bilateral arm pain    Cardiomyopathy ([place])    Diverticulosis    Dupuytren's contracture    Erosive gastritis    Foot pain, bilateral    GERD (gastroesophageal reflux disease)    Hand weakness    Hyperlipidemia    Knee pain    Midline low back pain    Osteoarthritis    neck and back   Pain in neck    hurts to turn side to side   Prostate cancer [facility])    prostate   Reactive airway disease    Seasonal allergies    Situational anxiety    Sleep apnea    CPAP - only uses "sometimes"    Patient Active Problem List   Diagnosis Date Noted   Chronic mid back pain 03/26/2020   Total body pain 03/26/2020   Osteoarthritis involving multiple joints 03/19/2020   Spondylosis without myelopathy or radiculopathy, cervical region 03/11/2020   Abnormal MRI, cervical spine (12/07/2019) 12/20/2019   Cervicalgia 11/26/2019    Chronic upper extremity pain (Bilateral) (L>R) 11/26/2019   Generalized anxiety disorder 11/26/2019   History of claustrophobia 11/26/2019   Major depressive disorder, single episode, severe with anxious distress ([place]) 03/28/2019   Bilateral leg and foot pain 07/26/2018   Decreased sensation 06/01/2018   Numbness and tingling 06/01/2018   Weakness of left leg 06/01/2018   Burning sensation of feet 06/01/2018   Numbness and tingling of both legs 06/01/2018   Heart palpitations 05/11/2018   DDD (degenerative disc disease), cervical 04/26/2018   DDD (degenerative disc disease), lumbosacral 04/26/2018   Cervicogenic headache 04/26/2018   Cervico-occipital neuralgia 04/26/2018   Osteoarthritis of spine with radiculopathy, lumbar region 04/26/2018   Pharmacologic therapy 04/25/2018   Disorder of skeletal system 04/25/2018   Problems influencing health status 04/25/2018   Vitamin D insufficiency 05/09/2017   Acute postoperative pain 03/21/2017   Chronic pain syndrome 08/30/2016   Chronic musculoskeletal pain 03/18/2016   Neurogenic pain 03/18/2016   Chronic sacroiliac joint pain (Bilateral) (L>R) 12/23/2015   Chest pain 11/06/2015   Primary cardiomyopathy ([place]) 11/06/2015   Chest pain with high risk for cardiac etiology 11/06/2015   Chronic shoulder pain (Bilateral) (L>R) 08/22/2015   Breathing orally 08/22/2015   Opioid-induced constipation (OIC) 08/20/2015   History of surgical procedure 08/12/2015   Long term current  use of opiate analgesic 07/23/2015   Long term prescription opiate use 07/23/2015   Opiate use (45 MME/Day) 07/23/2015   Opioid type dependence ([place]) 07/23/2015   Encounter for therapeutic drug level monitoring 07/23/2015   Chronic low back pain (Primary Area of Pain) (Bilateral) (L>R) 07/23/2015   Chronic lower extremity pain (Third area of Pain) (Bilateral) (L>R) 07/23/2015   Chronic lumbar radicular pain (Bilateral) (L>R)  (Bilateral S1 dermatomal distribution) 07/23/2015   Chronic hip pain (Secondary area of Pain) (Bilateral) (L>R) 07/23/2015   Chronic upper back pain (Bilateral) (L>R) 07/23/2015   Chronic neck pain (neck<shoulder) (L>R) 07/23/2015   Chronic foot pain 07/23/2015   Chronic knee pain (Right) 07/23/2015   Cervical spondylosis 07/23/2015   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) 07/23/2015   Lumbar spondylosis 07/23/2015   Cervical facet hypertrophy 07/23/2015   Cervical facet syndrome (R>L) 07/23/2015   History of prostate cancer 07/23/2015   Radicular pain of shoulder 07/23/2015   Class I Morbid obesity (HCC) (68% higher incidence of chronic low back problems) 07/23/2015   Peripheral neuropathy (HCC) (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) 07/23/2015   Lumbar facet syndrome (Bilateral) (L>R) 07/23/2015   Sleep apnea 07/23/2015   Depression 07/23/2015   High cholesterol 07/23/2015   Low testosterone 07/23/2015   Polyradiculopathy 04/11/2015   Hand weakness 12/04/2014   Bilateral arm pain 12/04/2014   Pain of right foot 09/30/2014    Past Surgical History:  Procedure Laterality Date   CHOLECYSTECTOMY N/A 12/02/2015   Procedure: LAPAROSCOPIC CHOLECYSTECTOMY;  Surgeon: Leonie Green, MD;  Location: [facility] ORS;  Service: General;  Laterality: N/A;   COLONOSCOPY  10/24/2012   COLONOSCOPY WITH PROPOFOL N/A 11/17/2017   Procedure: COLONOSCOPY WITH PROPOFOL;  Surgeon: Lollie Sails, MD;  Location: [facility] ENDOSCOPY;  Service: Endoscopy;  Laterality: N/A;   ESOPHAGOGASTRODUODENOSCOPY N/A 11/17/2017   Procedure: ESOPHAGOGASTRODUODENOSCOPY (EGD);  Surgeon: Lollie Sails, MD;  Location: [facility] ENDOSCOPY;  Service: Endoscopy;  Laterality: N/A;   ESOPHAGOGASTRODUODENOSCOPY N/A 03/24/2020   Procedure: ESOPHAGOGASTRODUODENOSCOPY (EGD);  Surgeon: [name], MD;  Location: [facility] ENDOSCOPY;  Service: Endoscopy;  Laterality: N/A;    ESOPHAGOGASTRODUODENOSCOPY (EGD) WITH PROPOFOL N/A 10/28/2015   Procedure: ESOPHAGOGASTRODUODENOSCOPY (EGD) WITH PROPOFOL;  Surgeon: Lollie Sails, MD;  Location: [facility] ENDOSCOPY;  Service: Endoscopy;  Laterality: N/A;   HAND SURGERY Bilateral    HERNIA REPAIR     MASS EXCISION Right 07/18/2015   Procedure: EXCISION MASS RIGHT HAND;  Surgeon: Leanor Kail, MD;  Location: [facility];  Service: Orthopedics;  Laterality: Right;  CPAP   NOSE SURGERY     prostate seeding     SKIN CANCER EXCISION Left 2015   shoulder   TONSILLECTOMY  2009   Surgery for OSA (screw in place)   UPPER GI ENDOSCOPY  12/18/2012   x3       Home Medications    Prior to Admission medications   Medication Sig Start Date End Date Taking? Authorizing Provider  aspirin 81 MG tablet Take 81 mg by mouth daily.   Yes [provider]  buPROPion (WELLBUTRIN XL) 300 MG 24 hr tablet Take 300 mg by mouth daily.   Yes [provider]  Cholecalciferol 125 MCG (5000 UT) capsule Take 1 capsule (5,000 Units total) by mouth daily. 06/25/20 09/23/20 Yes Milinda Pointer, MD  gabapentin (NEURONTIN) 400 MG capsule Take 1-2 capsules (400-800 mg total) by mouth at bedtime. 08/20/20 11/18/20 Yes Milinda Pointer, MD  oxyCODONE (OXY IR/ROXICODONE) 5 MG  immediate release tablet Take 1 tablet (5 mg total) by mouth every 8 (eight) hours as needed for severe pain. Must last 30 days 07/25/20 08/24/20 Yes Milinda Pointer, MD  pantoprazole (PROTONIX) 40 MG tablet Take 40 mg by mouth daily.   Yes [provider]  simvastatin (ZOCOR) 10 MG tablet TAKE 1 TABLET BY MOUTH EVERY DAY AT NIGHT 11/15/18  Yes [provider]  meloxicam (MOBIC) 15 MG tablet Take 1 tablet (15 mg total) by mouth daily. 06/25/20 09/23/20  Milinda Pointer, MD  oxyCODONE (OXY IR/ROXICODONE) 5 MG immediate release tablet Take 1 tablet (5 mg total) by mouth every 8 (eight) hours as needed for severe pain. Must last 30 days  06/25/20 07/25/20  Milinda Pointer, MD  oxyCODONE (OXY IR/ROXICODONE) 5 MG immediate release tablet Take 1 tablet (5 mg total) by mouth every 8 (eight) hours as needed for severe pain. Must last 30 days 08/24/20 09/23/20  Milinda Pointer, MD    Family History Family History  Problem Relation Age of Onset   Lung cancer Father    Prostate cancer Brother    Colon cancer Brother    Hyperlipidemia Sister     Social History Social History   Tobacco Use   Smoking status: Never Smoker   Smokeless tobacco: Never Used  Scientific laboratory technician Use: Never used  Substance Use Topics   Alcohol use: No    Alcohol/week: 0.0 standard drinks   Drug use: No     Allergies   Patient has no known allergies.   Review of Systems Review of Systems   Physical Exam Triage Vital Signs ED Triage Vitals  Enc Vitals Group     BP 08/04/20 0933 (!) 133/99     Pulse Rate 08/04/20 0933 74     Resp 08/04/20 0933 18     Temp 08/04/20 0933 98.3 F (36.8 C)     Temp Source 08/04/20 0933 Oral     SpO2 08/04/20 0933 100 %     Weight 08/04/20 0929 169 lb 15.6 oz (77.1 kg)     Height 08/04/20 0929 5\' 9"  (1.753 m)     Head Circumference --      Peak Flow --      Pain Score 08/04/20 0929 0     Pain Loc --      Pain Edu? --      Excl. in [place]? --    No data found.  Updated Vital Signs BP (!) 133/99 (BP Location: Right Arm)    Pulse 74    Temp 98.3 F (36.8 C) (Oral)    Resp 18    Ht 5\' 9"  (1.753 m)    Wt 169 lb 15.6 oz (77.1 kg)    SpO2 100%    BMI 25.10 kg/m   Visual Acuity Right Eye Distance:   Left Eye Distance:   Bilateral Distance:    Right Eye Near:   Left Eye Near:    Bilateral Near:     Physical Exam Constitutional:      Appearance: He is well-developed.  Cardiovascular:     Rate and Rhythm: Normal rate.  Pulmonary:     Effort: Pulmonary effort is normal.     Comments: With one inspiration during exam patient noted to basically double inspire rapidly, he describes  that is the sensation/experience which brings him in; otherwise normal breathing pattern and lungs clear Abdominal:     Tenderness: There is abdominal tenderness in the right upper quadrant. There  is no guarding or rebound.  Skin:    General: Skin is warm and dry.  Neurological:     Mental Status: He is alert and oriented to person, place, and time.     EKG:  NSR rate of 66 . Previous EKG was not available for review. No stwave changes as interpreted by me.   UC Treatments / Results  Labs (all labs ordered are listed, but only abnormal results are displayed) Labs Reviewed  COMPREHENSIVE METABOLIC PANEL  LIPASE, BLOOD    EKG   Radiology DG Chest 2 View  Result Date: 08/04/2020 CLINICAL DATA:  69 year old male with chest pain, shortness of breath. Pain with deep breaths x1 month. Right rib pain. EXAM: CHEST - 2 VIEW COMPARISON:  Chest CT 11/15/2013. FINDINGS: Lung volumes and mediastinal contours remain normal. Visualized tracheal air column is within normal limits. Both lungs appear clear. No pneumothorax or pleural effusion. Cholecystectomy clips in the right upper quadrant are new since 2015. Negative visible bowel gas pattern. No acute osseous abnormality identified, visible osseous structures appear relatively normal for age. IMPRESSION: Negative.  No acute cardiopulmonary abnormality. Electronically Signed   By: Genevie Ann M.D.   On: 08/04/2020 09:51    Procedures Procedures (including critical care time)  Medications Ordered in UC Medications - No data to display  Initial Impression / Assessment and Plan / UC Course  I have reviewed the triage vital signs and the nursing notes.  Pertinent labs & imaging results that were available during my care of the patient were reviewed by me and considered in my medical decision making (see chart for details).     Occasional "catch" in breath as well as RUQ abdominal/ rib pain. On exam he is tender to RUQ abdomen with CMP collected  and pending. Chest film and EKG without acute findings today. gerd considered, diaphragm spasm? on protonix. Anxiety considered; on chronic oxycodone, some respiratory depression as source? No red flag findings here today. Continue with prescribed medications, follow up with PCP for recheck as may need further evaluation or referral if persistent. Return for any worsening. Patient verbalized understanding and agreeable to plan.   Final Clinical Impressions(s) / UC Diagnoses   Final diagnoses:  RUQ pain  Altered breathing pattern     Discharge Instructions     Your chest xray and ekg look well today.  With the pain you are experiencing to your Right upper abdomen I am checking a few labs to evaluate your liver. We will call you if there are any concerning findings. If normal you won't hear from me.  I would like you to follow up with your primary care provider as you may need further evaluation and referral if this continues. Return for any worsening.  Continue with your normally prescribed medications.     ED Prescriptions    None     PDMP not reviewed this encounter.   Zigmund Gottron, NP 08/04/20 1008

## 2020-08-04 NOTE — ED Triage Notes (Signed)
Patient c/o feeling like he has to take a deep breath x 1 month. He reports pain on the right side of his rib area.

## 2020-08-04 NOTE — Discharge Instructions (Signed)
Your chest xray and ekg look well today.  With the pain you are experiencing to your Right upper abdomen I am checking a few labs to evaluate your liver. We will call you if there are any concerning findings. If normal you won't hear from me.  I would like you to follow up with your primary care provider as you may need further evaluation and referral if this continues. Return for any worsening.  Continue with your normally prescribed medications.

## 2020-08-25 ENCOUNTER — Other Ambulatory Visit: Payer: Self-pay | Admitting: Specialist

## 2020-08-25 DIAGNOSIS — R0789 Other chest pain: Secondary | ICD-10-CM

## 2020-09-16 DIAGNOSIS — F1120 Opioid dependence, uncomplicated: Secondary | ICD-10-CM | POA: Insufficient documentation

## 2020-09-16 NOTE — Progress Notes (Signed)
PROVIDER NOTE: Information contained herein reflects review and annotations entered in association with encounter. Interpretation of such information and data should be left to medically-trained personnel. Information provided to patient can be located elsewhere in the medical record under "Patient Instructions". Document created using STT-dictation technology, any transcriptional errors that may result from process are unintentional.    Patient: Ricky Mercer  Service Category: E/M  Provider: Gaspar Cola, MD  DOB: 14-Nov-1950  DOS: 09/17/2020  Specialty: Interventional Pain Management  MRN: [id number]  Setting: Ambulatory outpatient  PCP: Sofie Hartigan, MD  Type: Established Patient    Referring Provider: Sofie Hartigan, MD  Location: Office  Delivery: Face-to-face     HPI  Ricky Mercer, a 70 y.o. year old male, is here today because of his Chronic pain syndrome [G89.4]. [name] primary complain today is Leg Pain (Bilateral ), Foot Pain (Bilateral ), and Back Pain (Lumbar bilateral ) Last encounter: My last encounter with him was on 06/25/2020. Pertinent problems: Mr. Pinzon has Hand weakness; Polyradiculopathy; Chronic low back pain (Primary Area of Pain) (Bilateral) (L>R); Chronic lower extremity pain (Third area of Pain) (Bilateral) (L>R); Chronic lumbar radicular pain (Bilateral) (L>R) (Bilateral S1 dermatomal distribution); Chronic hip pain (Secondary area of Pain) (Bilateral) (L>R); Chronic upper back pain (Bilateral) (L>R); Chronic neck pain (neck<shoulder) (L>R); Chronic foot pain; Chronic knee pain (Right); Cervical spondylosis; 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); Lumbar spondylosis; Cervical facet hypertrophy; Cervical facet syndrome (R>L); History of prostate cancer; Radicular pain of shoulder; Peripheral neuropathy (HCC) (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); Lumbar facet syndrome (Bilateral) (L>R); Chronic shoulder pain  (Bilateral) (L>R); Chronic sacroiliac joint pain (Bilateral) (L>R); Chronic musculoskeletal pain; Pain of right foot; Neurogenic pain; Chronic pain syndrome; Bilateral arm pain; Acute postoperative pain; DDD (degenerative disc disease), cervical; DDD (degenerative disc disease), lumbosacral; Cervicogenic headache; Cervico-occipital neuralgia; Osteoarthritis of spine with radiculopathy, lumbar region; Decreased sensation; Numbness and tingling; Weakness of left leg; Bilateral leg and foot pain; Cervicalgia; Chronic upper extremity pain (Bilateral) (L>R); Abnormal MRI, cervical spine (12/07/2019); Burning sensation of feet; Numbness and tingling of both legs; Spondylosis without myelopathy or radiculopathy, cervical region; Osteoarthritis involving multiple joints; Chronic mid back pain; and Total body pain on their pertinent problem list. Pain Assessment: Severity of Chronic pain is reported as a 5 /10. Location: Leg (see visit info for additional pain sites.) Left,Right/question as to leg pain is coming from back. Onset: More than a month ago. Quality: Discomfort,Constant,Burning,Tingling (burning and tingling that runs all over his body and never stops). Timing: Constant. Modifying factor(s): oxycodone and gabapentin. Vitals:  height is 5' 9"  (1.753 m) and weight is 169 lb (76.7 kg). His temporal temperature is 97.9 F (36.6 C). His blood pressure is 134/100 (abnormal) and his pulse is 78. His respiration is 16 and oxygen saturation is 100%.   Reason for encounter: medication management.   The patient indicates doing well with the current medication regimen. No adverse reactions or side effects reported to the medications. PMP & UDS compliant.   Today the patient wanted to know if I could prescribe a 200 mg gabapentin so that he can take 600 mg at bedtime instead of having an option between 400 mg and 800 mg.  Since the request was kind of odd, I asked him the reason why he wanted to do that and he indicated  that when he takes the 400 mg he still gets up in the middle of the night with  generalized burning pain, drenched in sweat.  However, he cannot take the 800 mg because he would still wake up in the middle of the night but then he would feel very sleepy and in a fog where he feels that he is unstable on his feet.  I asked him if his pain was in any particular area of the body but he indicated that it is generalized over all of his muscles and it feels as a burning sensation.  Reviewing his medical record, I noticed that he has "sleep apnea" as a diagnosis, but he does not have any information as to whether this is obstructive or central.  Furthermore, when I asked him if he was using a CPAP machine he indicated that he was unable to tolerate the mask and therefore he is not using it.  Putting all of this together, it became very clear to me that the primary cause of his problems is the sleep apnea.  He is still undergoing episodes of apnea and night which in turn are causing his heart to work harder and he is having episodes of hypoxia, the combination of which is going to cause him to wake up drenched in sweat, as he describes.  Furthermore, the hypoxia is likely to be responsible for episodes of generalized muscle lactic acid accumulation, which in turn would be responsible for the generalized burning sensation that he is experiencing at night.  Because of this, I have recommended to the patient a couple of things:  I have recommended that he talk to his primary care physician to further evaluate the nature of his sleep apnea and determine whether is central or obstructive.  If it is obstructive, then I have recommended that he be referred to an ENT for possible removal of redundant oropharyngeal tissue that may be contributing to his obstructive sleep apnea.  If his is central, then further investigation should be undertaken to see if it is secondary to iron deficiency or any other particular reason that can be  corrected or improved.  In any case, he should also be referred to a sleep specialist so that he can be fit with an appropriate nasal mask since he was unable to tolerate the full facial mask.  I can tell from personal experience that having mask that does not fit correctly will probably cause the patient to be noncompliant with therapy.  Today I went into the Internet and I showed him the type of mass that I would recommend that he try for this.  Below is a picture of the mass that I recommend.    To help the patient determine if my theory about the lactic acid is correct, I have recommended that he take a tablespoon of baking soda mixed in 8 ounces of water, the next time that he wakes up in the middle of the night with a burning sensation.  If after 30 minutes to an hour the pain improves, that would confirm that intermittent state of acidosis.  The patient also asked me about taking the baking soda in his reflux and I informed them that he should not immediately go to bed after having the baking soda but should remain in a seated position for about 30 minutes to an hour.  I also took the opportunity to explain to the patient that it is very likely that his nocturnal GERD is associated with the thoracic negative pressure created immediately after those episodes of apnea, when the body is trying to compensate.  The patient was reminded that should his sleep apnea be central, he should be very careful on what he takes before going to bed since several medications and/or combination of medications may worsen and decrease his respiratory drive, further worsening the sleep apnea.  I took the time to go over that and tell him what combinations and what medicines would probably cause that.  At the end of all this, I reminded the patient that although this is somewhat associated with chronic pain, the primary causes what needs to be treated (the sleep apnea) and that is not within our realm of expertise or within  the things that we personally treat.  Once again, for the 3rd-4th time during this appointment I reminded him that he needs to talk to his primary care physician so that this problem can be properly addressed and hopefully solved.  He understood and accepted.  For the purpose of answering the patient's questions about the gabapentin, I suggested staying on the same dose and if anything making sure that he does not take the gabapentin in combination with his oxycodone.  He was informed that he should avoid taking the oxycodone within 4 hours of bedtime.  Again he understood and accepted.  RTCB: 12/22/2020 Nonopioids transferred 06/25/2020: Vitamin D3, Neurontin, and Mobic.  Pharmacotherapy Assessment   Analgesic: Oxycodone 5 mg, 1 tab PO q 8 hrs (15 mg/day of oxycodone) MME/day: 22.5 mg/day.   Monitoring: [place] PMP: PDMP reviewed during this encounter.       Pharmacotherapy: No side-effects or adverse reactions reported. Compliance: No problems identified. Effectiveness: Clinically acceptable.  Janett Billow, RN  09/17/2020 10:46 AM  Sign when Signing Visit Nursing Pain Medication Assessment:  Safety precautions to be maintained throughout the outpatient stay will include: orient to surroundings, keep bed in low position, maintain call bell within reach at all times, provide assistance with transfer out of bed and ambulation.  Medication Inspection Compliance: Pill count conducted under aseptic conditions, in front of the patient. Neither the pills nor the bottle was removed from the patient's sight at any time. Once count was completed pills were immediately returned to the patient in their original bottle.  Medication: Oxycodone IR Pill/Patch Count: 37 of 90 pills remain Pill/Patch Appearance: Markings consistent with prescribed medication Bottle Appearance: Standard pharmacy container. Clearly labeled. Filled Date: 13 / 18 / 2021 Last Medication intake:  Today    UDS:  Summary   Date Value Ref Range Status  03/11/2020 Note  Corrected    Comment:    ==================================================================== [employer] [facility] (MW) ==================================================================== Test                             Result       Flag       Units  Drug Present and Declared for Prescription Verification   Oxycodone                      97           EXPECTED   ng/mg creat   Oxymorphone                    930          EXPECTED   ng/mg creat   Noroxycodone                   601  EXPECTED   ng/mg creat   Noroxymorphone                 369          EXPECTED   ng/mg creat    Sources of oxycodone are scheduled prescription medications.    Oxymorphone, noroxycodone, and noroxymorphone are expected    metabolites of oxycodone. Oxymorphone is also available as a    scheduled prescription medication.  Drug Present not Declared for Prescription Verification   Oxazepam                       12           UNEXPECTED ng/mg creat    Oxazepam may be administered as a scheduled prescription medication;    it is also an expected metabolite of other benzodiazepine drugs,    including diazepam, chlordiazepoxide, prazepam, clorazepate,    halazepam, and temazepam.  Drug Absent but Declared for Prescription Verification   Hydrocodone                    Not Detected UNEXPECTED ng/mg creat ==================================================================== Test                      Result    Flag   Units      Ref Range   Creatinine              227              mg/dL      >=20 ==================================================================== Declared Medications:  The flagging and interpretation on this report are based on the  following declared medications.  Unexpected results may arise from  inaccuracies in the declared medications.   **Note: The testing scope of this panel includes these medications:   Hydrocodone (Norco)  Oxycodone  (Roxicodone)   **Note: The testing scope of this panel does not include the  following reported medications:   Acetaminophen (Norco)  Aspirin  Cholecalciferol  Gabapentin (Neurontin)  Pantoprazole (Protonix)  Simvastatin (Zocor) ==================================================================== For clinical consultation, please call [phone]. ====================================================================      ROS  Constitutional: Denies any fever or chills Gastrointestinal: No reported hemesis, hematochezia, vomiting, or acute GI distress Musculoskeletal: Denies any acute onset joint swelling, redness, loss of ROM, or weakness Neurological: No reported episodes of acute onset apraxia, aphasia, dysarthria, agnosia, amnesia, paralysis, loss of coordination, or loss of consciousness  Medication Review  Cholecalciferol, aspirin, buPROPion, gabapentin, meloxicam, oxyCODONE, pantoprazole, and simvastatin  History Review  Allergy: Mr. Flax has No Known Allergies. Drug: Mr. Rio  reports no history of drug use. Alcohol:  reports no history of alcohol use. Tobacco:  reports that he has never smoked. He has never used smokeless tobacco. Social: Mr. Diemer  reports that he has never smoked. He has never used smokeless tobacco. He reports that he does not drink alcohol and does not use drugs. Medical:  has a past medical history of Acute postoperative pain (03/21/2017), Bilateral arm pain, Cardiomyopathy ([place]), Diverticulosis, Dupuytren's contracture, Erosive gastritis, Foot pain, bilateral, GERD (gastroesophageal reflux disease), Hand weakness, Hyperlipidemia, Knee pain, Midline low back pain, Osteoarthritis, Pain in neck, Prostate cancer ([place]), Reactive airway disease, Seasonal allergies, Situational anxiety, and Sleep apnea. Surgical: Mr. Silveria  has a past surgical history that includes Nose surgery; Hand surgery (Bilateral); Colonoscopy (10/24/2012); Upper gi  endoscopy (12/18/2012); Tonsillectomy (2009); Skin cancer excision (Left, 2015); Mass excision (Right, 07/18/2015); Esophagogastroduodenoscopy (egd) with propofol (  N/A, 10/28/2015); prostate seeding; Hernia repair; Cholecystectomy (N/A, 12/02/2015); Esophagogastroduodenoscopy (N/A, 11/17/2017); Colonoscopy with propofol (N/A, 11/17/2017); and Esophagogastroduodenoscopy (N/A, 03/24/2020). Family: family history includes Colon cancer in his brother; Hyperlipidemia in his sister; Lung cancer in his father; Prostate cancer in his brother.  Laboratory Chemistry Profile   Renal Lab Results  Component Value Date   BUN 11 08/04/2020   CREATININE 0.99 08/04/2020   BCR 12 08/06/2019   GFRAA 99 08/06/2019   GFRNONAA >60 08/04/2020     Hepatic Lab Results  Component Value Date   AST 20 08/04/2020   ALT 15 08/04/2020   ALBUMIN 3.8 08/04/2020   ALKPHOS 47 08/04/2020   LIPASE 24 08/04/2020     Electrolytes Lab Results  Component Value Date   NA 139 08/04/2020   K 4.3 08/04/2020   CL 102 08/04/2020   CALCIUM 8.8 (L) 08/04/2020   MG 2.0 08/06/2019     Bone Lab Results  Component Value Date   25OHVITD1 49 08/06/2019   25OHVITD2 <1.0 08/06/2019   25OHVITD3 49 08/06/2019     Inflammation (CRP: Acute Phase) (ESR: Chronic Phase) Lab Results  Component Value Date   CRP <1 08/06/2019   ESRSEDRATE 2 08/06/2019       Note: Above Lab results reviewed.  Recent Imaging Review  DG Chest 2 View CLINICAL DATA:  70 year old male with chest pain, shortness of breath. Pain with deep breaths x1 month. Right rib pain.  EXAM: CHEST - 2 VIEW  COMPARISON:  Chest CT 11/15/2013.  FINDINGS: Lung volumes and mediastinal contours remain normal. Visualized tracheal air column is within normal limits. Both lungs appear clear. No pneumothorax or pleural effusion.  Cholecystectomy clips in the right upper quadrant are new since 2015. Negative visible bowel gas pattern.  No acute osseous abnormality  identified, visible osseous structures appear relatively normal for age.  IMPRESSION: Negative.  No acute cardiopulmonary abnormality.  Electronically Signed   By: Genevie Ann M.D.   On: 08/04/2020 09:51 Note: Reviewed        Physical Exam  General appearance: Well nourished, well developed, and well hydrated. In no apparent acute distress Mental status: Alert, oriented x 3 (person, place, & time)       Respiratory: No evidence of acute respiratory distress Eyes: PERLA Vitals: BP (!) 134/100 (BP Location: Right Arm, Patient Position: Sitting, Cuff Size: Normal)    Pulse 78    Temp 97.9 F (36.6 C) (Temporal)    Resp 16    Ht 5' 9"  (1.753 m)    Wt 169 lb (76.7 kg)    SpO2 100%    BMI 24.96 kg/m  BMI: Estimated body mass index is 24.96 kg/m as calculated from the following:   Height as of this encounter: 5' 9"  (1.753 m).   Weight as of this encounter: 169 lb (76.7 kg). Ideal: Ideal body weight: 70.7 kg (155 lb 13.8 oz) Adjusted ideal body weight: 73.1 kg (161 lb 1.9 oz)  Assessment   Status Diagnosis  Controlled Controlled Controlled 1. Chronic pain syndrome   2. Chronic low back pain (Primary Area of Pain) (Bilateral) (L>R)   3. Chronic lower extremity pain (Third area of Pain) (Bilateral) (L>R)   4. Chronic hip pain (Secondary area of Pain) (Bilateral) (L>R)   5. Cervicalgia   6. Cervical facet syndrome (R>L)   7. Pharmacologic therapy   8. Uncomplicated opioid dependence ([place])   9. Sleep apnea, unspecified type      Updated Problems: No problems updated.  Plan of Care  Problem-specific:  No problem-specific Assessment & Plan notes found for this encounter.  Mr. KIMARION CHERY has a current medication list which includes the following long-term medication(s): bupropion, cholecalciferol, gabapentin, meloxicam, pantoprazole, simvastatin, [START ON 09/23/2020] oxycodone, [START ON 10/23/2020] oxycodone, and [START ON 11/22/2020] oxycodone.  Pharmacotherapy (Medications  Ordered): Meds ordered this encounter  Medications   oxyCODONE (OXY IR/ROXICODONE) 5 MG immediate release tablet    Sig: Take 1 tablet (5 mg total) by mouth every 8 (eight) hours as needed for severe pain. Must last 30 days    Dispense:  90 tablet    Refill:  0    Chronic Pain: STOP Act (Not applicable) Fill 1 day early if closed on refill date. Avoid benzodiazepines within 8 hours of opioids   oxyCODONE (OXY IR/ROXICODONE) 5 MG immediate release tablet    Sig: Take 1 tablet (5 mg total) by mouth every 8 (eight) hours as needed for severe pain. Must last 30 days    Dispense:  90 tablet    Refill:  0    Chronic Pain: STOP Act (Not applicable) Fill 1 day early if closed on refill date. Avoid benzodiazepines within 8 hours of opioids   oxyCODONE (OXY IR/ROXICODONE) 5 MG immediate release tablet    Sig: Take 1 tablet (5 mg total) by mouth every 8 (eight) hours as needed for severe pain. Must last 30 days    Dispense:  90 tablet    Refill:  0    Chronic Pain: STOP Act (Not applicable) Fill 1 day early if closed on refill date. Avoid benzodiazepines within 8 hours of opioids   Orders:  No orders of the defined types were placed in this encounter.  Follow-up plan:   Return in about 3 months (around 12/22/2020) for (F2F), (Med Mgmt).      Considering:   Therapeutic right cervical facet RFA #1  Therapeutic right lumbar facet RFA #1  Therapeutic right SI joint RFA #1  Diagnostic bilateral Hipjoint injection  Diagnostic Caudal ESI + diagnostic epidurogram  Diagnostic right Knee injection  Diagnostic right Genicular NB  Diagnostic bilateral IA shoulderinjection  Diagnostic bilateral suprascapular NB    Palliative PRN treatment(s):   Palliative bilateral cervical facet block #2  Palliative right lumbar facet block #4  Palliative left lumbar facet block #4  Palliative right SI joint block #3  Palliative left SI joint block #3  Palliative left lumbar facet RFA (last done on 03/21/2017)   Palliative left SI joint RFA (last done on 03/21/2017)  Therapeutic/palliative left CESI #4  Palliative left cervical facet RFA #2 (last done 03/11/2020)      Recent Visits Date Type Provider Dept  06/25/20 Office Visit Milinda Pointer, MD [facility]  Showing recent visits within past 90 days and meeting all other requirements Today's Visits Date Type Provider Dept  09/17/20 Office Visit Milinda Pointer, MD [facility]  Showing today's visits and meeting all other requirements Future Appointments No visits were found meeting these conditions. Showing future appointments within next 90 days and meeting all other requirements  I discussed the assessment and treatment plan with the patient. The patient was provided an opportunity to ask questions and all were answered. The patient agreed with the plan and demonstrated an understanding of the instructions.  Patient advised to call back or seek an in-person evaluation if the symptoms or condition worsens.  Duration of encounter: 42 minutes.  Note by: Gaspar Cola, MD Date: 09/17/2020; Time:  12:08 PM

## 2020-09-17 ENCOUNTER — Ambulatory Visit
Admission: RE | Admit: 2020-09-17 | Discharge: 2020-09-17 | Disposition: A | Discharge status: Home / Self Care | Payer: Medicare Other | Source: Ambulatory Visit | Attending: Specialist | Admitting: Specialist

## 2020-09-17 ENCOUNTER — Other Ambulatory Visit: Payer: Self-pay

## 2020-09-17 ENCOUNTER — Ambulatory Visit (HOSPITAL_BASED_OUTPATIENT_CLINIC_OR_DEPARTMENT_OTHER): Payer: Medicare Other | Admitting: Pain Medicine

## 2020-09-17 ENCOUNTER — Encounter: Payer: Self-pay | Admitting: Pain Medicine

## 2020-09-17 VITALS — BP 134/100 | HR 78 | Temp 97.9°F | Resp 16 | Ht 69.0 in | Wt 169.0 lb

## 2020-09-17 DIAGNOSIS — M79605 Pain in left leg: Secondary | ICD-10-CM

## 2020-09-17 DIAGNOSIS — F1120 Opioid dependence, uncomplicated: Secondary | ICD-10-CM

## 2020-09-17 DIAGNOSIS — M25559 Pain in unspecified hip: Secondary | ICD-10-CM

## 2020-09-17 DIAGNOSIS — R0789 Other chest pain: Secondary | ICD-10-CM

## 2020-09-17 DIAGNOSIS — M5442 Lumbago with sciatica, left side: Secondary | ICD-10-CM

## 2020-09-17 DIAGNOSIS — M5441 Lumbago with sciatica, right side: Secondary | ICD-10-CM | POA: Insufficient documentation

## 2020-09-17 DIAGNOSIS — M542 Cervicalgia: Secondary | ICD-10-CM | POA: Insufficient documentation

## 2020-09-17 DIAGNOSIS — Z79899 Other long term (current) drug therapy: Secondary | ICD-10-CM

## 2020-09-17 DIAGNOSIS — M47812 Spondylosis without myelopathy or radiculopathy, cervical region: Secondary | ICD-10-CM

## 2020-09-17 DIAGNOSIS — M79604 Pain in right leg: Secondary | ICD-10-CM

## 2020-09-17 DIAGNOSIS — G4730 Sleep apnea, unspecified: Secondary | ICD-10-CM

## 2020-09-17 DIAGNOSIS — G8929 Other chronic pain: Secondary | ICD-10-CM

## 2020-09-17 DIAGNOSIS — G894 Chronic pain syndrome: Secondary | ICD-10-CM | POA: Diagnosis not present

## 2020-09-17 MED ORDER — OXYCODONE HCL 5 MG PO TABS: 5.0000 mg | ORAL_TABLET | Freq: Three times a day (TID) | ORAL | 0 refills | Status: DC | PRN

## 2020-09-17 NOTE — Progress Notes (Signed)
Nursing Pain Medication Assessment:  Safety precautions to be maintained throughout the outpatient stay will include: orient to surroundings, keep bed in low position, maintain call bell within reach at all times, provide assistance with transfer out of bed and ambulation.  Medication Inspection Compliance: Pill count conducted under aseptic conditions, in front of the patient. Neither the pills nor the bottle was removed from the patient's sight at any time. Once count was completed pills were immediately returned to the patient in their original bottle.  Medication: Oxycodone IR Pill/Patch Count: 37 of 90 pills remain Pill/Patch Appearance: Markings consistent with prescribed medication Bottle Appearance: Standard pharmacy container. Clearly labeled. Filled Date: 35 / 18 / 2021 Last Medication intake:  Today

## 2020-09-17 NOTE — Patient Instructions (Signed)
____________________________________________________________________________________________  Medication Rules  Purpose: To inform patients, and their family members, of our rules and regulations.  Applies to: All patients receiving prescriptions (written or electronic).  Pharmacy of record: Pharmacy where electronic prescriptions will be sent. If written prescriptions are taken to a different pharmacy, please inform the nursing staff. The pharmacy listed in the electronic medical record should be the one where you would like electronic prescriptions to be sent.  Electronic prescriptions: In compliance with the [place] Strengthen Opioid Misuse Prevention (STOP) Act of 2017 (Session Law 2017-74/H243), effective September 13, 2018, all controlled substances must be electronically prescribed. Calling prescriptions to the pharmacy will cease to exist.  Prescription refills: Only during scheduled appointments. Applies to all prescriptions.  NOTE: The following applies primarily to controlled substances (Opioid* Pain Medications).   Type of encounter (visit): For patients receiving controlled substances, face-to-face visits are required. (Not an option or up to the patient.)  Patient's responsibilities: 1. Pain Pills: Bring all pain pills to every appointment (except for procedure appointments). 2. Pill Bottles: Bring pills in original pharmacy bottle. Always bring the newest bottle. Bring bottle, even if empty. 3. Medication refills: You are responsible for knowing and keeping track of what medications you take and those you need refilled. The day before your appointment: write a list of all prescriptions that need to be refilled. The day of the appointment: give the list to the admitting nurse. Prescriptions will be written only during appointments. No prescriptions will be written on procedure days. If you forget a medication: it will not be "Called in", "Faxed", or "electronically sent".  You will need to get another appointment to get these prescribed. No early refills. Do not call asking to have your prescription filled early. 4. Prescription Accuracy: You are responsible for carefully inspecting your prescriptions before leaving our office. Have the discharge nurse carefully go over each prescription with you, before taking them home. Make sure that your name is accurately spelled, that your address is correct. Check the name and dose of your medication to make sure it is accurate. Check the number of pills, and the written instructions to make sure they are clear and accurate. Make sure that you are given enough medication to last until your next medication refill appointment. 5. Taking Medication: Take medication as prescribed. When it comes to controlled substances, taking less pills or less frequently than prescribed is permitted and encouraged. Never take more pills than instructed. Never take medication more frequently than prescribed.  6. Inform other Doctors: Always inform, all of your healthcare providers, of all the medications you take. 7. Pain Medication from other Providers: You are not allowed to accept any additional pain medication from any other Doctor or Healthcare provider. There are two exceptions to this rule. (see below) In the event that you require additional pain medication, you are responsible for notifying us, as stated below. 8. Cough Medicine: Often these contain an opioid, such as codeine or hydrocodone. Never accept or take cough medicine containing these opioids if you are already taking an opioid* medication. The combination may cause respiratory failure and death. 9. Medication Agreement: You are responsible for carefully reading and following our Medication Agreement. This must be signed before receiving any prescriptions from our practice. Safely store a copy of your signed Agreement. Violations to the Agreement will result in no further prescriptions.  (Additional copies of our Medication Agreement are available upon request.) 10. Laws, Rules, & Regulations: All patients are expected to follow all   Federal and State Laws, Statutes, Rules, & Regulations. Ignorance of the Laws does not constitute a valid excuse.  11. Illegal drugs and Controlled Substances: The use of illegal substances (including, but not limited to marijuana and its derivatives) and/or the illegal use of any controlled substances is strictly prohibited. Violation of this rule may result in the immediate and permanent discontinuation of any and all prescriptions being written by our practice. The use of any illegal substances is prohibited. 12. Adopted CDC guidelines & recommendations: Target dosing levels will be at or below 60 MME/day. Use of benzodiazepines** is not recommended.  Exceptions: There are only two exceptions to the rule of not receiving pain medications from other Healthcare Providers. 1. Exception #1 (Emergencies): In the event of an emergency (i.e.: accident requiring emergency care), you are allowed to receive additional pain medication. However, you are responsible for: As soon as you are able, call our office [phone], at any time of the day or night, and leave a message stating your name, the date and nature of the emergency, and the name and dose of the medication prescribed. In the event that your call is answered by a member of our staff, make sure to document and save the date, time, and the name of the person that took your information.  2. Exception #2 (Planned Surgery): In the event that you are scheduled by another doctor or dentist to have any type of surgery or procedure, you are allowed (for a period no longer than 30 days), to receive additional pain medication, for the acute post-op pain. However, in this case, you are responsible for picking up a copy of our "Post-op Pain Management for Surgeons" handout, and giving it to your surgeon or dentist. This  document is available at our office, and does not require an appointment to obtain it. Simply go to our office during business hours (Monday-Thursday from 8:00 AM to 4:00 PM) (Friday 8:00 AM to 12:00 Noon) or if you have a scheduled appointment with us, prior to your surgery, and ask for it by name. In addition, you are responsible for: calling our office [phone], at any time of the day or night, and leaving a message stating your name, name of your surgeon, type of surgery, and date of procedure or surgery. Failure to comply with your responsibilities may result in termination of therapy involving the controlled substances.  *Opioid medications include: morphine, codeine, oxycodone, oxymorphone, hydrocodone, hydromorphone, meperidine, tramadol, tapentadol, buprenorphine, fentanyl, methadone. **Benzodiazepine medications include: diazepam (Valium), alprazolam (Xanax), clonazepam (Klonopine), lorazepam (Ativan), clorazepate (Tranxene), chlordiazepoxide (Librium), estazolam (Prosom), oxazepam (Serax), temazepam (Restoril), triazolam (Halcion) (Last updated: 08/11/2020) ____________________________________________________________________________________________   ____________________________________________________________________________________________  Medication Recommendations and Reminders  Applies to: All patients receiving prescriptions (written and/or electronic).  Medication Rules & Regulations: These rules and regulations exist for your safety and that of others. They are not flexible and neither are we. Dismissing or ignoring them will be considered "non-compliance" with medication therapy, resulting in complete and irreversible termination of such therapy. (See document titled "Medication Rules" for more details.) In all conscience, because of safety reasons, we cannot continue providing a therapy where the patient does not follow instructions.  Pharmacy of record:   Definition:  This is the pharmacy where your electronic prescriptions will be sent.   We do not endorse any particular pharmacy, however, we have experienced problems with [facility] not securing enough medication supply for the community.  We do not restrict you in your choice of pharmacy. However,   once we write for your prescriptions, we will NOT be re-sending more prescriptions to fix restricted supply problems created by your pharmacy, or your insurance.   The pharmacy listed in the electronic medical record should be the one where you want electronic prescriptions to be sent.  If you choose to change pharmacy, simply notify our nursing staff.  Recommendations:  Keep all of your pain medications in a safe place, under lock and key, even if you live alone. We will NOT replace lost, stolen, or damaged medication.  After you fill your prescription, take 1 week's worth of pills and put them away in a safe place. You should keep a separate, properly labeled bottle for this purpose. The remainder should be kept in the original bottle. Use this as your primary supply, until it runs out. Once it's gone, then you know that you have 1 week's worth of medicine, and it is time to come in for a prescription refill. If you do this correctly, it is unlikely that you will ever run out of medicine.  To make sure that the above recommendation works, it is very important that you make sure your medication refill appointments are scheduled at least 1 week before you run out of medicine. To do this in an effective manner, make sure that you do not leave the office without scheduling your next medication management appointment. Always ask the nursing staff to show you in your prescription , when your medication will be running out. Then arrange for the receptionist to get you a return appointment, at least 7 days before you run out of medicine. Do not wait until you have 1 or 2 pills left, to come in. This is very poor planning and  does not take into consideration that we may need to cancel appointments due to bad weather, sickness, or emergencies affecting our staff.  DO NOT ACCEPT A "Partial Fill": If for any reason your pharmacy does not have enough pills/tablets to completely fill or refill your prescription, do not allow for a "partial fill". The law allows the pharmacy to complete that prescription within 72 hours, without requiring a new prescription. If they do not fill the rest of your prescription within those 72 hours, you will need a separate prescription to fill the remaining amount, which we will NOT provide. If the reason for the partial fill is your insurance, you will need to talk to the pharmacist about payment alternatives for the remaining tablets, but again, DO NOT ACCEPT A PARTIAL FILL, unless you can trust your pharmacist to obtain the remainder of the pills within 72 hours.  Prescription refills and/or changes in medication(s):   Prescription refills, and/or changes in dose or medication, will be conducted only during scheduled medication management appointments. (Applies to both, written and electronic prescriptions.)  No refills on procedure days. No medication will be changed or started on procedure days. No changes, adjustments, and/or refills will be conducted on a procedure day. Doing so will interfere with the diagnostic portion of the procedure.  No phone refills. No medications will be "called into the pharmacy".  No Fax refills.  No weekend refills.  No Holliday refills.  No after hours refills.  Remember:  Business hours are:  Monday to Thursday 8:00 AM to 4:00 PM Provider's Schedule: Ronen Bromwell, MD - Appointments are:  Medication management: Monday and Wednesday 8:00 AM to 4:00 PM Procedure day: Tuesday and Thursday 7:30 AM to 4:00 PM Bilal Lateef, MD - Appointments are:    Medication management: Tuesday and Thursday 8:00 AM to 4:00 PM Procedure day: Monday and Wednesday  7:30 AM to 4:00 PM (Last update: 04/02/2020) ____________________________________________________________________________________________   ____________________________________________________________________________________________  CBD (cannabidiol) WARNING  Applicable to: All individuals currently taking or considering taking CBD (cannabidiol) and, more important, all patients taking opioid analgesic controlled substances (pain medication). (Example: oxycodone; oxymorphone; hydrocodone; hydromorphone; morphine; methadone; tramadol; tapentadol; fentanyl; buprenorphine; butorphanol; dextromethorphan; meperidine; codeine; etc.)  Legal status: CBD remains a Schedule I drug prohibited for any use. CBD is illegal with one exception. In the [place], CBD has a limited Transport planner (FDA) approval for the treatment of two specific types of epilepsy disorders. Only one CBD product has been approved by the FDA for this purpose: "Epidiolex". FDA is aware that some companies are marketing products containing cannabis and cannabis-derived compounds in ways that violate the [employer], Drug and Cosmetic Act [facility] Act) and that may put the health and safety of consumers at risk. The FDA, a Federal agency, has not enforced the CBD status since 2018.   Legality: Some manufacturers ship CBD products nationally, which is illegal. Often such products are sold online and are therefore available throughout the country. CBD is openly sold in head shops and health food stores in some states where such sales have not been explicitly legalized. Selling unapproved products with unsubstantiated therapeutic claims is not only a violation of the law, but also can put patients at risk, as these products have not been proven to be safe or effective. Federal illegality makes it difficult to conduct research on CBD.  Reference: "FDA Regulation of Cannabis and Cannabis-Derived Products, Including Cannabidiol  (CBD)" - [employer]  Warning: CBD is not FDA approved and has not undergo the same manufacturing controls as prescription drugs.  This means that the purity and safety of available CBD may be questionable. Most of the time, despite manufacturer's claims, it is contaminated with THC (delta-9-tetrahydrocannabinol - the chemical in marijuana responsible for the "HIGH").  When this is the case, the [facility] contaminant will trigger a positive urine drug screen (UDS) test for Marijuana (carboxy-THC). Because a positive UDS for any illicit substance is a violation of our medication agreement, your opioid analgesics (pain medicine) may be permanently discontinued.  MORE ABOUT CBD  General Information: CBD  is a derivative of the Marijuana (cannabis sativa) plant discovered in 39. It is one of the 113 identified substances found in Marijuana. It accounts for up to 40% of the plant's extract. As of 2018, preliminary clinical studies on CBD included research for the treatment of anxiety, movement disorders, and pain. CBD is available and consumed in multiple forms, including inhalation of smoke or vapor, as an aerosol spray, and by mouth. It may be supplied as an oil containing CBD, capsules, dried cannabis, or as a liquid solution. CBD is thought not to be as psychoactive as THC (delta-9-tetrahydrocannabinol - the chemical in marijuana responsible for the "HIGH"). Studies suggest that CBD may interact with different biological target receptors in the body, including cannabinoid and other neurotransmitter receptors. As of 2018 the mechanism of action for its biological effects has not been determined.  Side-effects  Adverse reactions: Dry mouth, diarrhea, decreased appetite, fatigue, drowsiness, malaise, weakness, sleep disturbances, and others.  Drug interactions: CBC may interact with other  medications such as blood-thinners. (Last update: 04/19/2020) ____________________________________________________________________________________________   Recommendations: 1. Try taking 1 tablespoon of baking soda in 8 ounces of water, no more than once a day, when experiencing severe  generalized burning pain in the middle of the night.  2. Have your primary care physician fully evaluate your sleep apnea to determine if what you have is an obstructive sleep apnea or central sleep apnea.  If necessary, have him refer you to a sleep specialist so that they can set you up with an proper nasal mask so that you start using your CPAP on a regular basis.  3. If you have with an obstructive sleep apnea, I would recommend an ENT evaluation for possible removal of redundant oropharyngeal tissue so as to improve sleep apnea.

## 2020-10-06 DIAGNOSIS — M797 Fibromyalgia: Secondary | ICD-10-CM | POA: Insufficient documentation

## 2020-10-30 ENCOUNTER — Other Ambulatory Visit
Admission: RE | Admit: 2020-10-30 | Discharge: 2020-10-30 | Disposition: A | Discharge status: Home / Self Care | Payer: Medicare Other | Source: Ambulatory Visit | Attending: Gastroenterology | Admitting: Gastroenterology

## 2020-10-30 ENCOUNTER — Other Ambulatory Visit: Payer: Self-pay

## 2020-10-30 DIAGNOSIS — Z20822 Contact with and (suspected) exposure to covid-19: Secondary | ICD-10-CM | POA: Insufficient documentation

## 2020-10-30 DIAGNOSIS — Z01812 Encounter for preprocedural laboratory examination: Secondary | ICD-10-CM | POA: Insufficient documentation

## 2020-10-30 LAB — SARS CORONAVIRUS 2 (TAT 6-24 HRS): SARS Coronavirus 2: NEGATIVE

## 2020-11-03 ENCOUNTER — Ambulatory Visit: Payer: Medicare Other | Admitting: Anesthesiology

## 2020-11-03 ENCOUNTER — Ambulatory Visit
Admission: RE | Admit: 2020-11-03 | Discharge: 2020-11-03 | Disposition: A | Discharge status: Home / Self Care | Payer: Medicare Other | Attending: Gastroenterology | Admitting: Gastroenterology

## 2020-11-03 ENCOUNTER — Other Ambulatory Visit: Payer: Self-pay

## 2020-11-03 ENCOUNTER — Encounter: Payer: Self-pay | Admitting: Anesthesiology

## 2020-11-03 ENCOUNTER — Encounter
Admission: RE | Disposition: A | Discharge status: Home / Self Care | Payer: Self-pay | Source: Home / Self Care | Attending: Gastroenterology

## 2020-11-03 DIAGNOSIS — Z1211 Encounter for screening for malignant neoplasm of colon: Secondary | ICD-10-CM | POA: Diagnosis not present

## 2020-11-03 DIAGNOSIS — Z7952 Long term (current) use of systemic steroids: Secondary | ICD-10-CM | POA: Diagnosis not present

## 2020-11-03 DIAGNOSIS — K640 First degree hemorrhoids: Secondary | ICD-10-CM | POA: Insufficient documentation

## 2020-11-03 DIAGNOSIS — D123 Benign neoplasm of transverse colon: Secondary | ICD-10-CM | POA: Diagnosis not present

## 2020-11-03 DIAGNOSIS — Z800 Family history of malignant neoplasm of digestive organs: Secondary | ICD-10-CM | POA: Insufficient documentation

## 2020-11-03 DIAGNOSIS — Z8546 Personal history of malignant neoplasm of prostate: Secondary | ICD-10-CM | POA: Diagnosis not present

## 2020-11-03 DIAGNOSIS — Z86010 Personal history of colonic polyps: Secondary | ICD-10-CM | POA: Insufficient documentation

## 2020-11-03 DIAGNOSIS — Z791 Long term (current) use of non-steroidal anti-inflammatories (NSAID): Secondary | ICD-10-CM | POA: Insufficient documentation

## 2020-11-03 DIAGNOSIS — Z7982 Long term (current) use of aspirin: Secondary | ICD-10-CM | POA: Insufficient documentation

## 2020-11-03 DIAGNOSIS — Z79899 Other long term (current) drug therapy: Secondary | ICD-10-CM | POA: Diagnosis not present

## 2020-11-03 DIAGNOSIS — Z85828 Personal history of other malignant neoplasm of skin: Secondary | ICD-10-CM | POA: Insufficient documentation

## 2020-11-03 DIAGNOSIS — K5730 Diverticulosis of large intestine without perforation or abscess without bleeding: Secondary | ICD-10-CM | POA: Diagnosis not present

## 2020-11-03 HISTORY — DX: Cardiomyopathy, unspecified: I42.9

## 2020-11-03 HISTORY — DX: Benign neoplasm of colon, unspecified: D12.6

## 2020-11-03 HISTORY — DX: Other cardiomyopathies: I42.8

## 2020-11-03 HISTORY — DX: Myoneural disorder, unspecified: G70.9

## 2020-11-03 HISTORY — DX: Other chronic pain: G89.29

## 2020-11-03 HISTORY — DX: Fibromyalgia: M79.7

## 2020-11-03 HISTORY — DX: Unspecified malignant neoplasm of skin, unspecified: C44.90

## 2020-11-03 HISTORY — DX: Chronic pain syndrome: G89.4

## 2020-11-03 HISTORY — DX: Depression, unspecified: F32.A

## 2020-11-03 HISTORY — DX: Palpitations: R00.2

## 2020-11-03 HISTORY — DX: Opioid dependence, uncomplicated: F11.20

## 2020-11-03 HISTORY — PX: COLONOSCOPY WITH PROPOFOL: SHX5780

## 2020-11-03 HISTORY — DX: Angina pectoris, unspecified: I20.9

## 2020-11-03 SURGERY — COLONOSCOPY WITH PROPOFOL
Anesthesia: General

## 2020-11-03 MED ORDER — PROPOFOL 500 MG/50ML IV EMUL
INTRAVENOUS | Status: DC | PRN
  Administered 2020-11-03: 120 ug/kg/min via INTRAVENOUS

## 2020-11-03 MED ORDER — EPHEDRINE 5 MG/ML INJ
INTRAVENOUS | Status: AC
  Filled 2020-11-03: qty 10

## 2020-11-03 MED ORDER — SODIUM CHLORIDE 0.9 % IV SOLN: INTRAVENOUS | Status: DC

## 2020-11-03 MED ORDER — PROPOFOL 500 MG/50ML IV EMUL
INTRAVENOUS | Status: AC
  Filled 2020-11-03: qty 50

## 2020-11-03 MED ORDER — PHENYLEPHRINE HCL (PRESSORS) 10 MG/ML IV SOLN
INTRAVENOUS | Status: DC | PRN
  Administered 2020-11-03: 50 ug via INTRAVENOUS

## 2020-11-03 MED ORDER — PHENYLEPHRINE HCL (PRESSORS) 10 MG/ML IV SOLN
INTRAVENOUS | Status: AC
  Filled 2020-11-03: qty 1

## 2020-11-03 MED ORDER — EPHEDRINE SULFATE 50 MG/ML IJ SOLN
INTRAMUSCULAR | Status: DC | PRN
  Administered 2020-11-03 (×2): 10 mg via INTRAVENOUS

## 2020-11-03 NOTE — Progress Notes (Signed)
No risk at this time. 

## 2020-11-03 NOTE — Op Note (Signed)
[facility] Patient Name: Ricky Mercer Procedure Date: 11/03/2020 9:55 AM MRN: [id number] Account #: [id number] Date of Birth: 1951-05-23 Admit Type: Outpatient Age: 70 Room: [facility] ENDO ROOM 3 Gender: Male Note Status: Finalized Procedure:             Colonoscopy Indications:           High risk colon cancer surveillance: Personal history                         of multiple (3 or more) adenomas Providers:             Andrey Farmer MD, MD Medicines:             Monitored Anesthesia Care Complications:         No immediate complications. Estimated blood loss:                         Minimal. Procedure:             Pre-Anesthesia Assessment:                        - Prior to the procedure, a History and Physical was                         performed, and patient medications and allergies were                         reviewed. The patient is competent. The risks and                         benefits of the procedure and the sedation options and                         risks were discussed with the patient. All questions                         were answered and informed consent was obtained.                         Patient identification and proposed procedure were                         verified by the physician, the nurse, the anesthetist                         and the technician in the endoscopy suite. Mental                         Status Examination: alert and oriented. Airway                         Examination: normal oropharyngeal airway and neck                         mobility. Respiratory Examination: clear to                         auscultation. CV Examination: normal. Prophylactic  Antibiotics: The patient does not require prophylactic                         antibiotics. Prior Anticoagulants: The patient has                         taken no previous anticoagulant or antiplatelet                          agents. ASA Grade Assessment: II - A patient with mild                         systemic disease. After reviewing the risks and                         benefits, the patient was deemed in satisfactory                         condition to undergo the procedure. The anesthesia                         plan was to use monitored anesthesia care (MAC).                         Immediately prior to administration of medications,                         the patient was re-assessed for adequacy to receive                         sedatives. The heart rate, respiratory rate, oxygen                         saturations, blood pressure, adequacy of pulmonary                         ventilation, and response to care were monitored                         throughout the procedure. The physical status of the                         patient was re-assessed after the procedure.                        After obtaining informed consent, the colonoscope was                         passed under direct vision. Throughout the procedure,                         the patient's blood pressure, pulse, and oxygen                         saturations were monitored continuously. The                         Colonoscope was introduced through the anus and  advanced to the the cecum, identified by appendiceal                         orifice and ileocecal valve. The colonoscopy was                         performed without difficulty. The patient tolerated                         the procedure well. The quality of the bowel                         preparation was good. Findings:      The perianal and digital rectal examinations were normal.      Two sessile polyps were found in the hepatic flexure. The polyps were 1       to 2 mm in size. These polyps were removed with a jumbo cold forceps.       Resection and retrieval were complete. Estimated blood loss was minimal.      A 5 mm polyp was found in the  hepatic flexure. The polyp was sessile.       The polyp was removed with a cold snare. Resection and retrieval were       complete. Estimated blood loss was minimal.      A 3 mm polyp was found in the transverse colon distal transverse colon.       The polyp was sessile. The polyp was removed with a jumbo cold forceps.       Resection and retrieval were complete. Estimated blood loss was minimal.      A few small-mouthed diverticula were found in the sigmoid colon.      Internal hemorrhoids were found during retroflexion. The hemorrhoids       were Grade I (internal hemorrhoids that do not prolapse).      The exam was otherwise without abnormality on direct and retroflexion       views. Impression:            - Two 1 to 2 mm polyps at the hepatic flexure, removed                         with a jumbo cold forceps. Resected and retrieved.                        - One 5 mm polyp at the hepatic flexure, removed with                         a cold snare. Resected and retrieved.                        - One 3 mm polyp in the transverse colon in the distal                         transverse colon, removed with a jumbo cold forceps.                         Resected and retrieved.                        -  Diverticulosis in the sigmoid colon.                        - Internal hemorrhoids.                        - The examination was otherwise normal on direct and                         retroflexion views. Recommendation:        - Discharge patient to home.                        - Resume previous diet.                        - Continue present medications.                        - Await pathology results.                        - Repeat colonoscopy for surveillance based on                         pathology results.                        - Return to referring physician as previously                         scheduled. Procedure Code(s):     --- Professional ---                        [phone],  Colonoscopy, flexible; with removal of                         tumor(s), polyp(s), or other lesion(s) by snare                         technique                        [id number], 73, Colonoscopy, flexible; with biopsy, single                         or multiple Diagnosis Code(s):     --- Professional ---                        K63.5, Polyp of colon                        Z86.010, Personal history of colonic polyps                        K64.0, First degree hemorrhoids                        K57.30, Diverticulosis of large intestine without                         perforation or abscess without bleeding CPT copyright 2019 American Medical  Association. All rights reserved. The codes documented in this report are preliminary and upon coder review may  be revised to meet current compliance requirements. Andrey Farmer MD, MD 11/03/2020 11:05:01 AM Number of Addenda: 0 Note Initiated On: 11/03/2020 9:55 AM Scope Withdrawal Time: 0 hours 16 minutes 5 seconds  Total Procedure Duration: 0 hours 18 minutes 2 seconds  Estimated Blood Loss:  Estimated blood loss was minimal.      [facility]

## 2020-11-03 NOTE — Anesthesia Procedure Notes (Signed)
Performed by: Cook-Martin, Mikiyah Glasner °Pre-anesthesia Checklist: Patient identified, Emergency Drugs available, Suction available, Patient being monitored and Timeout performed °Patient Re-evaluated:Patient Re-evaluated prior to induction °Oxygen Delivery Method: Simple face mask °Preoxygenation: Pre-oxygenation with 100% oxygen °Induction Type: IV induction °Placement Confirmation: positive ETCO2 and CO2 detector ° ° ° ° ° ° °

## 2020-11-03 NOTE — Anesthesia Postprocedure Evaluation (Signed)
Anesthesia Post Note  Patient: Ricky Mercer  Procedure(s) Performed: COLONOSCOPY WITH PROPOFOL (N/A )  Patient location during evaluation: Phase II Anesthesia Type: General Level of consciousness: awake and alert, awake and oriented Pain management: pain level controlled Vital Signs Assessment: post-procedure vital signs reviewed and stable Respiratory status: spontaneous breathing, nonlabored ventilation and respiratory function stable Cardiovascular status: blood pressure returned to baseline and stable Postop Assessment: no apparent nausea or vomiting Anesthetic complications: no   No complications documented.   Last Vitals:  Vitals:   11/03/20 1108 11/03/20 1118  BP: 121/62 114/81  Pulse: 87 85  Resp: 17 13  Temp: 37 C   SpO2: 100% 99%    Last Pain:  Vitals:   11/03/20 1118  PainSc: 0-No pain                 Phill Mutter

## 2020-11-03 NOTE — Transfer of Care (Signed)
Immediate Anesthesia Transfer of Care Note  Patient: Ricky Mercer  Procedure(s) Performed: COLONOSCOPY WITH PROPOFOL (N/A )  Patient Location: PACU  Anesthesia Type:General  Level of Consciousness: awake and sedated  Airway & Oxygen Therapy: Patient Spontanous Breathing and Patient connected to nasal cannula oxygen  Post-op Assessment: Report given to RN and Post -op Vital signs reviewed and stable  Post vital signs: Reviewed and stable  Last Vitals:  Vitals Value Taken Time  BP    Temp    Pulse    Resp    SpO2      Last Pain:  Vitals:   11/03/20 1022  PainSc: 5          Complications: No complications documented.

## 2020-11-03 NOTE — Interval H&P Note (Signed)
History and Physical Interval Note:  11/03/2020 10:35 AM  Ricky Mercer  has presented today for surgery, with the diagnosis of HX ADEN POLYPS.  The various methods of treatment have been discussed with the patient and family. After consideration of risks, benefits and other options for treatment, the patient has consented to  Procedure(s): COLONOSCOPY WITH PROPOFOL (N/A) as a surgical intervention.  The patient's history has been reviewed, patient examined, no change in status, stable for surgery.  I have reviewed the patient's chart and labs.  Questions were answered to the patient's satisfaction.     [name]  Ok to proceed with colonoscopy

## 2020-11-03 NOTE — Anesthesia Preprocedure Evaluation (Signed)
Anesthesia Evaluation  Patient identified by MRN, date of birth, ID band Patient awake    Reviewed: Allergy & Precautions, H&P , NPO status , Patient's Chart, lab work & pertinent test results  History of Anesthesia Complications Negative for: history of anesthetic complications  Airway Mallampati: III  TM Distance: >3 FB Neck ROM: limited    Dental  (+) Chipped, Poor Dentition, Dental Advidsory Given   Pulmonary neg shortness of breath, sleep apnea (has CPAP prescribed but does not wear it) , neg COPD, neg recent URI,    Pulmonary exam normal        Cardiovascular Exercise Tolerance: Good (-) hypertension(-) angina(-) CAD, (-) Past MI, (-) Cardiac Stents and (-) CABG Normal cardiovascular exam - Systolic murmurs    Neuro/Psych  Headaches, neg Seizures PSYCHIATRIC DISORDERS Anxiety Depression  Neuromuscular disease negative psych ROS   GI/Hepatic Neg liver ROS, PUD, GERD  Medicated and Controlled,  Endo/Other  negative endocrine ROSneg diabetes  Renal/GU negative Renal ROS  negative genitourinary   Musculoskeletal  (+) Arthritis , Fibromyalgia -  Abdominal (+) - obese,   Peds  Hematology negative hematology ROS (+)   Anesthesia Other Findings Past Medical History: No date: Bilateral arm pain No date: Cardiomyopathy ([place]) No date: Diverticulosis No date: Dupuytren's contracture No date: Erosive gastritis No date: Foot pain, bilateral No date: GERD (gastroesophageal reflux disease) No date: Hand weakness No date: Hyperlipidemia No date: Knee pain No date: Midline low back pain No date: Osteoarthritis     Comment:  neck and back No date: Pain in neck     Comment:  hurts to turn side to side No date: Prostate cancer (HCC)     Comment:  prostate No date: Reactive airway disease No date: Seasonal allergies No date: Situational anxiety No date: Sleep apnea     Comment:  CPAP - only uses "sometimes"    Reproductive/Obstetrics negative OB ROS                             Anesthesia Physical  Anesthesia Plan  ASA: III  Anesthesia Plan: General   Post-op Pain Management:    Induction: Intravenous  PONV Risk Score and Plan: 1 and Propofol infusion and TIVA  Airway Management Planned: Natural Airway and Nasal Cannula  Additional Equipment:   Intra-op Plan:   Post-operative Plan:   Informed Consent: I have reviewed the patients History and Physical, chart, labs and discussed the procedure including the risks, benefits and alternatives for the proposed anesthesia with the patient or authorized representative who has indicated his/her understanding and acceptance.     Dental advisory given  Plan Discussed with: CRNA and Anesthesiologist  Anesthesia Plan Comments: (Patient consented for risks of anesthesia including but not limited to:  - adverse reactions to medications - risk of airway placement if required - damage to eyes, teeth, lips or other oral mucosa - nerve damage due to positioning  - sore throat or hoarseness - Damage to heart, brain, nerves, lungs, other parts of body or loss of life  Patient voiced understanding.)        Anesthesia Quick Evaluation

## 2020-11-03 NOTE — H&P (Signed)
Outpatient short stay form Pre-procedure 11/03/2020 10:32 AM Raylene Miyamoto MD, MPH  Primary Physician: Dr. Ellison Hughs  Reason for visit:  Surveillance colonoscopy  History of present illness:   70 y/o gentleman with history of EGJ outflow obstruction and family history of colon cancer here for surveillance colonoscopy due to history of polyps. No abdominal surgeries. Brother with history of colon cancer.    Current Facility-Administered Medications:  .  0.9 %  sodium chloride infusion, , Intravenous, Continuous, Jaylenn Altier, Hilton Cork, MD, Last Rate: 20 mL/hr at 11/03/20 1029, New Bag at 11/03/20 1029  Medications Prior to Admission  Medication Sig Dispense Refill Last Dose  . gabapentin (NEURONTIN) 400 MG capsule Take 1-2 capsules (400-800 mg total) by mouth at bedtime. 60 capsule 2 11/02/2020 at Unknown time  . predniSONE (DELTASONE) 10 MG tablet Take 10 mg by mouth daily with breakfast.   11/01/2020  . aspirin 81 MG tablet Take 81 mg by mouth daily.   11/01/2020  . buPROPion (WELLBUTRIN XL) 300 MG 24 hr tablet Take 300 mg by mouth daily.   11/01/2020  . Cholecalciferol 125 MCG (5000 UT) capsule Take 1 capsule (5,000 Units total) by mouth daily. 90 capsule 0   . meloxicam (MOBIC) 15 MG tablet Take 1 tablet (15 mg total) by mouth daily. 30 tablet 2   . oxyCODONE (OXY IR/ROXICODONE) 5 MG immediate release tablet Take 1 tablet (5 mg total) by mouth every 8 (eight) hours as needed for severe pain. Must last 30 days 90 tablet 0   . oxyCODONE (OXY IR/ROXICODONE) 5 MG immediate release tablet Take 1 tablet (5 mg total) by mouth every 8 (eight) hours as needed for severe pain. Must last 30 days 90 tablet 0 11/01/2020  . [START ON 11/22/2020] oxyCODONE (OXY IR/ROXICODONE) 5 MG immediate release tablet Take 1 tablet (5 mg total) by mouth every 8 (eight) hours as needed for severe pain. Must last 30 days 90 tablet 0   . pantoprazole (PROTONIX) 40 MG tablet Take 40 mg by mouth daily. (Patient not taking:  Reported on 11/03/2020)   Not Taking at Unknown time  . pregabalin (LYRICA) 50 MG capsule Take 50 mg by mouth 3 (three) times daily. (Patient not taking: Reported on 11/03/2020)   Not Taking at Unknown time  . simvastatin (ZOCOR) 10 MG tablet TAKE 1 TABLET BY MOUTH EVERY DAY AT NIGHT   11/01/2020     No Known Allergies   Past Medical History:  Diagnosis Date  . Acute postoperative pain 03/21/2017  . Anginal pain ([place])    chest pain w high risk for cardiac etiology  . Bilateral arm pain   . Cardiomyopathy ([place])   . Cardiomyopathy, idiopathic ([place])   . Chronic back pain   . Chronic pain syndrome   . Depression   . Diverticulosis   . Dupuytren's contracture   . Erosive gastritis   . Fibromyalgia   . Foot pain, bilateral   . GERD (gastroesophageal reflux disease)   . Hand weakness   . Hyperlipidemia   . Knee pain   . Midline low back pain   . Neuromuscular disorder (HCC)    lumbar polyradiculopathy, numbness and tingling of both legs  . Opioid dependence ([place])   . Osteoarthritis    neck and back  . Pain in neck    hurts to turn side to side  . Palpitations   . Prostate cancer [facility])    prostate  . Reactive airway disease   . Seasonal allergies   .  Situational anxiety   . Skin cancer   . Sleep apnea    CPAP - only uses "sometimes"  . Tubular adenoma of colon     Review of systems:  Otherwise negative.    Physical Exam  Gen: Alert, oriented. Appears stated age.  HEENT: PERRLA. Lungs: No respiratory distress CV: RRR Abd: soft, benign, no masses Ext: No edema    Planned procedures: Proceed with colonoscopy. The patient understands the nature of the planned procedure, indications, risks, alternatives and potential complications including but not limited to bleeding, infection, perforation, damage to internal organs and possible oversedation/side effects from anesthesia. The patient agrees and gives consent to proceed.  Please refer to procedure notes for findings,  recommendations and patient disposition/instructions.     Raylene Miyamoto MD, MPH Gastroenterology 11/03/2020  10:32 AM

## 2020-11-04 LAB — SURGICAL PATHOLOGY

## 2020-11-05 ENCOUNTER — Encounter: Payer: Self-pay | Admitting: Gastroenterology

## 2020-12-16 DIAGNOSIS — Z79891 Long term (current) use of opiate analgesic: Secondary | ICD-10-CM | POA: Insufficient documentation

## 2020-12-16 NOTE — Progress Notes (Signed)
PROVIDER NOTE: Information contained herein reflects review and annotations entered in association with encounter. Interpretation of such information and data should be left to medically-trained personnel. Information provided to patient can be located elsewhere in the medical record under "Patient Instructions". Document created using STT-dictation technology, any transcriptional errors that may result from process are unintentional.    Patient: Ricky Mercer  Service Category: E/M  Provider: Gaspar Cola, MD  DOB: 20-Feb-1951  DOS: 12/17/2020  Specialty: Interventional Pain Management  MRN: [id number]  Setting: Ambulatory outpatient  PCP: Sofie Hartigan, MD  Type: Established Patient    Referring Provider: Sofie Hartigan, MD  Location: Office  Delivery: Face-to-face     HPI  [name], a 70 y.o. year old male, is here today because of his Chronic pain syndrome [G89.4]. [name] primary complain today is Back Pain Last encounter: My last encounter with him was on 09/17/2020. Pertinent problems: [name] has Hand weakness; Polyradiculopathy; Chronic low back pain (Primary Area of Pain) (Bilateral) (L>R); Chronic lower extremity pain (Third area of Pain) (Bilateral) (L>R); Chronic lumbar radicular pain (Bilateral) (L>R) (Bilateral S1 dermatomal distribution); Chronic hip pain (Secondary area of Pain) (Bilateral) (L>R); Chronic upper back pain (Bilateral) (L>R); Chronic neck pain (neck<shoulder) (L>R); Chronic foot pain; Chronic knee pain (Right); Cervical spondylosis; 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); Lumbar spondylosis; Cervical facet hypertrophy; Cervical facet syndrome (R>L); History of prostate cancer; Radicular pain of shoulder; Peripheral neuropathy (HCC) (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); Lumbar facet syndrome (Bilateral) (L>R); Chronic shoulder pain (Bilateral) (L>R); Chronic sacroiliac joint pain (Bilateral) (L>R); Chronic  musculoskeletal pain; Pain of right foot; Neurogenic pain; Chronic pain syndrome; Bilateral arm pain; Acute postoperative pain; DDD (degenerative disc disease), cervical; DDD (degenerative disc disease), lumbosacral; Cervicogenic headache; Cervico-occipital neuralgia; Osteoarthritis of spine with radiculopathy, lumbar region; Decreased sensation; Numbness and tingling; Weakness of left leg; Bilateral leg and foot pain; Cervicalgia; Chronic upper extremity pain (Bilateral) (L>R); Abnormal MRI, cervical spine (12/07/2019); Burning sensation of feet; Numbness and tingling of both legs; Spondylosis without myelopathy or radiculopathy, cervical region; Osteoarthritis involving multiple joints; Chronic mid back pain; and Total body pain on their pertinent problem list. Pain Assessment: Severity of Chronic pain is reported as a 5 /10. Location: Back  /Pain radiaties down both leg to his feet,Pain radiaites down his shoulder to his arm and fingers. Onset: More than a month ago. Quality: Tingling,Numbness,Aching,Burning. Timing: Constant. Modifying factor(s): Meds,hot bath at times. Vitals:  height is 5' 9"  (1.753 m) and weight is 165 lb (74.8 kg). His temperature is 97.2 F (36.2 C) (abnormal). His blood pressure is 120/91 (abnormal) and his pulse is 88. His oxygen saturation is 100%.   Reason for encounter: medication management.   The patient indicates doing well with the current medication regimen. No adverse reactions or side effects reported to the medications.  He describes having his usual back pain that seems to be in the mid back region and is described to be around the midline.  The patient had an MRI done by Dr. Cari Caraway, as well as another one ordered by Dr. Gurney Maxin.  The MRI ordered by Dr. Melrose Nakayama was in the thoracic region and the MRI ordered by Dr. Cari Caraway was of the lumbar region.  Both show some degenerative disc disease but no spinal stenosis or anything else that would explain the patient's  symptoms.  He refers that his pain tends to be intermittent and it is worse when he  picks up any weight at which time he then feels bilateral lower extremity pain in the bottom of his feet and what what appeared to be an S1 dermatomal distribution.  Review of the MRIs do not show any significant pathology around the L4-5, or L5-S1 region that would explain the patient's symptoms.  He refers that with his current medications the symptoms are under control and he does not feel that there is the need for any further interventional therapy at this time.  In view of this, we will continue to manage symptomatically, as we have in the past.  RTCB: 03/22/2021 Nonopioids transferred 06/25/2020: Vitamin D3, Neurontin, and Mobic.  Pharmacotherapy Assessment   Analgesic: Oxycodone 5 mg, 1 tab PO q 8 hrs (15 mg/day of oxycodone) MME/day: 22.5 mg/day.   Monitoring: [place] PMP: PDMP reviewed during this encounter.       Pharmacotherapy: No side-effects or adverse reactions reported. Compliance: No problems identified. Effectiveness: Clinically acceptable.  Chauncey Fischer, RN  12/17/2020 10:46 AM  Sign when Signing Visit Nursing Pain Medication Assessment:  Safety precautions to be maintained throughout the outpatient stay will include: orient to surroundings, keep bed in low position, maintain call bell within reach at all times, provide assistance with transfer out of bed and ambulation.  Medication Inspection Compliance: Pill count conducted under aseptic conditions, in front of the patient. Neither the pills nor the bottle was removed from the patient's sight at any time. Once count was completed pills were immediately returned to the patient in their original bottle.  Medication: Oxycodone IR Pill/Patch Count: 43 of 90 pills remain Pill/Patch Appearance: Markings consistent with prescribed medication Bottle Appearance: Standard pharmacy container. Clearly labeled. Filled Date: 3 / 60 / 22 Last Medication  intake:  YesterdaySafety precautions to be maintained throughout the outpatient stay will include: orient to surroundings, keep bed in low position, maintain call bell within reach at all times, provide assistance with transfer out of bed and ambulation.     UDS:  Summary  Date Value Ref Range Status  03/11/2020 Note  Corrected    Comment:    ==================================================================== [employer] [facility] (MW) ==================================================================== Test                             Result       Flag       Units  Drug Present and Declared for Prescription Verification   Oxycodone                      97           EXPECTED   ng/mg creat   Oxymorphone                    930          EXPECTED   ng/mg creat   Noroxycodone                   601          EXPECTED   ng/mg creat   Noroxymorphone                 369          EXPECTED   ng/mg creat    Sources of oxycodone are scheduled prescription medications.    Oxymorphone, noroxycodone, and noroxymorphone are expected    metabolites of oxycodone. Oxymorphone is also available as a  scheduled prescription medication.  Drug Present not Declared for Prescription Verification   Oxazepam                       12           UNEXPECTED ng/mg creat    Oxazepam may be administered as a scheduled prescription medication;    it is also an expected metabolite of other benzodiazepine drugs,    including diazepam, chlordiazepoxide, prazepam, clorazepate,    halazepam, and temazepam.  Drug Absent but Declared for Prescription Verification   Hydrocodone                    Not Detected UNEXPECTED ng/mg creat ==================================================================== Test                      Result    Flag   Units      Ref Range   Creatinine              227              mg/dL      >=20 ==================================================================== Declared Medications:  The flagging  and interpretation on this report are based on the  following declared medications.  Unexpected results may arise from  inaccuracies in the declared medications.   **Note: The testing scope of this panel includes these medications:   Hydrocodone (Norco)  Oxycodone (Roxicodone)   **Note: The testing scope of this panel does not include the  following reported medications:   Acetaminophen (Norco)  Aspirin  Cholecalciferol  Gabapentin (Neurontin)  Pantoprazole (Protonix)  Simvastatin (Zocor) ==================================================================== For clinical consultation, please call [phone]. ====================================================================      ROS  Constitutional: Denies any fever or chills Gastrointestinal: No reported hemesis, hematochezia, vomiting, or acute GI distress Musculoskeletal: Denies any acute onset joint swelling, redness, loss of ROM, or weakness Neurological: No reported episodes of acute onset apraxia, aphasia, dysarthria, agnosia, amnesia, paralysis, loss of coordination, or loss of consciousness  Medication Review  Cholecalciferol, aspirin, buPROPion, gabapentin, meloxicam, nortriptyline, oxyCODONE, predniSONE, and simvastatin  History Review  Allergy: [name] has No Known Allergies. Drug: [name]  reports no history of drug use. Alcohol:  reports no history of alcohol use. Tobacco:  reports that he has never smoked. He has never used smokeless tobacco. Social: [name]  reports that he has never smoked. He has never used smokeless tobacco. He reports that he does not drink alcohol and does not use drugs. Medical:  has a past medical history of Acute postoperative pain (03/21/2017), Anginal pain ([place]), Bilateral arm pain, Cardiomyopathy ([place]), Cardiomyopathy, idiopathic ([place]), Chronic back pain, Chronic pain syndrome, Depression, Diverticulosis, Dupuytren's contracture, Erosive gastritis, Fibromyalgia, Foot  pain, bilateral, GERD (gastroesophageal reflux disease), Hand weakness, Hyperlipidemia, Knee pain, Midline low back pain, Neuromuscular disorder (HCC), Opioid dependence ([place]), Osteoarthritis, Pain in neck, Palpitations, Prostate cancer ([place]), Reactive airway disease, Seasonal allergies, Situational anxiety, Skin cancer, Sleep apnea, and Tubular adenoma of colon. Surgical: [name]  has a past surgical history that includes Nose surgery; Hand surgery (Bilateral); Colonoscopy (10/24/2012); Upper gi endoscopy (12/18/2012); Tonsillectomy (2009); Skin cancer excision (Left, 2015); Mass excision (Right, 07/18/2015); Esophagogastroduodenoscopy (egd) with propofol (N/A, 10/28/2015); prostate seeding; Hernia repair; Cholecystectomy (N/A, 12/02/2015); Esophagogastroduodenoscopy (N/A, 11/17/2017); Colonoscopy with propofol (N/A, 11/17/2017); Esophagogastroduodenoscopy (N/A, 03/24/2020); and Colonoscopy with propofol (N/A, 11/03/2020). Family: family history includes Colon cancer in his brother; Hyperlipidemia in his sister; Lung cancer in his father; Prostate  cancer in his brother.  Laboratory Chemistry Profile   Renal Lab Results  Component Value Date   BUN 11 08/04/2020   CREATININE 0.99 08/04/2020   BCR 12 08/06/2019   GFRAA 99 08/06/2019   GFRNONAA >60 08/04/2020     Hepatic Lab Results  Component Value Date   AST 20 08/04/2020   ALT 15 08/04/2020   ALBUMIN 3.8 08/04/2020   ALKPHOS 47 08/04/2020   LIPASE 24 08/04/2020     Electrolytes Lab Results  Component Value Date   NA 139 08/04/2020   K 4.3 08/04/2020   CL 102 08/04/2020   CALCIUM 8.8 (L) 08/04/2020   MG 2.0 08/06/2019     Bone Lab Results  Component Value Date   25OHVITD1 49 08/06/2019   25OHVITD2 <1.0 08/06/2019   25OHVITD3 49 08/06/2019     Inflammation (CRP: Acute Phase) (ESR: Chronic Phase) Lab Results  Component Value Date   CRP <1 08/06/2019   ESRSEDRATE 2 08/06/2019       Note: Above Lab results reviewed.  Recent  Imaging Review  CT CHEST WO CONTRAST CLINICAL DATA:  Right lower chest pain for 5 months, increasing, no known injury  EXAM: CT CHEST WITHOUT CONTRAST  TECHNIQUE: Multidetector CT imaging of the chest was performed following the standard protocol without IV contrast.  COMPARISON:  11/15/2013  FINDINGS: Cardiovascular: No significant vascular findings. Normal heart size. No pericardial effusion.  Mediastinum/Nodes: No enlarged mediastinal, hilar, or axillary lymph nodes. Thyroid gland, trachea, and esophagus demonstrate no significant findings.  Lungs/Pleura: There is a small, definitively benign calcified nodule of the right upper lobe (series 3, image 40). There are occasional tiny noncalcified nodules, for example a 3 mm nodule of the lateral segment right middle lobe, stable compared to prior examination dated 2015 and definitively benign (series 3, image 96). No pleural effusion or pneumothorax.  Upper Abdomen: No acute abnormality.  Musculoskeletal: No chest wall mass or suspicious bone lesions identified.  IMPRESSION: 1. No non-contrast CT findings of the chest to explain right-sided chest pain.  2. Occasional tiny, stable, and definitively benign small pulmonary nodules for which no further follow-up is required.  Electronically Signed   By: Eddie Candle M.D.   On: 09/17/2020 13:26 Note: Reviewed        Physical Exam  General appearance: Well nourished, well developed, and well hydrated. In no apparent acute distress Mental status: Alert, oriented x 3 (person, place, & time)       Respiratory: No evidence of acute respiratory distress Eyes: PERLA Vitals: BP (!) 120/91   Pulse 88   Temp (!) 97.2 F (36.2 C)   Ht 5' 9"  (1.753 m)   Wt 165 lb (74.8 kg)   SpO2 100%   BMI 24.37 kg/m  BMI: Estimated body mass index is 24.37 kg/m as calculated from the following:   Height as of this encounter: 5' 9"  (1.753 m).   Weight as of this encounter: 165 lb (74.8  kg). Ideal: Ideal body weight: 70.7 kg (155 lb 13.8 oz) Adjusted ideal body weight: 72.4 kg (159 lb 8.3 oz)  Assessment   Status Diagnosis  Controlled Controlled Controlled 1. Chronic pain syndrome   2. Chronic low back pain (Primary Area of Pain) (Bilateral) (L>R)   3. Chronic lower extremity pain (Third area of Pain) (Bilateral) (L>R)   4. Chronic hip pain (Secondary area of Pain) (Bilateral) (L>R)   5. Cervicalgia   6. Cervical facet syndrome (R>L)   7. Pharmacologic therapy  8. Chronic use of opiate for therapeutic purpose   9. Uncomplicated opioid dependence ([place])      Updated Problems: No problems updated.  Plan of Care  Problem-specific:  No problem-specific Assessment & Plan notes found for this encounter.  [name] has a current medication list which includes the following long-term medication(s): bupropion, nortriptyline, [START ON 12/22/2020] oxycodone, [START ON 01/21/2021] oxycodone, [START ON 02/20/2021] oxycodone, simvastatin, cholecalciferol, gabapentin, and meloxicam.  Pharmacotherapy (Medications Ordered): Meds ordered this encounter  Medications  . oxyCODONE (OXY IR/ROXICODONE) 5 MG immediate release tablet    Sig: Take 1 tablet (5 mg total) by mouth every 8 (eight) hours as needed for severe pain. Must last 30 days    Dispense:  90 tablet    Refill:  0    Chronic Pain: STOP Act (Not applicable) Fill 1 day early if closed on refill date. Avoid benzodiazepines within 8 hours of opioids  . oxyCODONE (OXY IR/ROXICODONE) 5 MG immediate release tablet    Sig: Take 1 tablet (5 mg total) by mouth every 8 (eight) hours as needed for severe pain. Must last 30 days    Dispense:  90 tablet    Refill:  0    Chronic Pain: STOP Act (Not applicable) Fill 1 day early if closed on refill date. Avoid benzodiazepines within 8 hours of opioids  . oxyCODONE (OXY IR/ROXICODONE) 5 MG immediate release tablet    Sig: Take 1 tablet (5 mg total) by mouth every 8 (eight)  hours as needed for severe pain. Must last 30 days    Dispense:  90 tablet    Refill:  0    Chronic Pain: STOP Act (Not applicable) Fill 1 day early if closed on refill date. Avoid benzodiazepines within 8 hours of opioids   Orders:  No orders of the defined types were placed in this encounter.  Follow-up plan:   Return in about 3 months (around 03/22/2021) for (F2F), (MM).      Considering:   Therapeutic right cervical facet RFA #1  Therapeutic right lumbar facet RFA #1  Therapeutic right SI joint RFA #1  Diagnostic bilateral Hipjoint injection  Diagnostic Caudal ESI + diagnostic epidurogram  Diagnostic right Knee injection  Diagnostic right Genicular NB  Diagnostic bilateral IA shoulderinjection  Diagnostic bilateral suprascapular NB    Palliative PRN treatment(s):   Palliative bilateral cervical facet block #2  Palliative right lumbar facet block #4  Palliative left lumbar facet block #4  Palliative right SI joint block #3  Palliative left SI joint block #3  Palliative left lumbar facet RFA (last done on 03/21/2017)  Palliative left SI joint RFA (last done on 03/21/2017)  Therapeutic/palliative left CESI #4  Palliative left cervical facet RFA #2 (last done 03/11/2020)    Recent Visits No visits were found meeting these conditions. Showing recent visits within past 90 days and meeting all other requirements Today's Visits Date Type Provider Dept  12/17/20 Office Visit Milinda Pointer, MD [facility]  Showing today's visits and meeting all other requirements Future Appointments No visits were found meeting these conditions. Showing future appointments within next 90 days and meeting all other requirements  I discussed the assessment and treatment plan with the patient. The patient was provided an opportunity to ask questions and all were answered. The patient agreed with the plan and demonstrated an understanding of the instructions.  Patient advised to call  back or seek an in-person evaluation if the symptoms or condition worsens.  Duration of encounter: 30 minutes.  Note by: Gaspar Cola, MD Date: 12/17/2020; Time: 11:20 AM

## 2020-12-17 ENCOUNTER — Encounter: Payer: Self-pay | Admitting: Pain Medicine

## 2020-12-17 ENCOUNTER — Other Ambulatory Visit: Payer: Self-pay

## 2020-12-17 ENCOUNTER — Ambulatory Visit: Payer: Medicare Other | Attending: Pain Medicine | Admitting: Pain Medicine

## 2020-12-17 VITALS — BP 120/91 | HR 88 | Temp 97.2°F | Ht 69.0 in | Wt 165.0 lb

## 2020-12-17 DIAGNOSIS — M47812 Spondylosis without myelopathy or radiculopathy, cervical region: Secondary | ICD-10-CM | POA: Diagnosis present

## 2020-12-17 DIAGNOSIS — M542 Cervicalgia: Secondary | ICD-10-CM | POA: Diagnosis present

## 2020-12-17 DIAGNOSIS — M5442 Lumbago with sciatica, left side: Secondary | ICD-10-CM | POA: Diagnosis not present

## 2020-12-17 DIAGNOSIS — M79604 Pain in right leg: Secondary | ICD-10-CM | POA: Diagnosis present

## 2020-12-17 DIAGNOSIS — M79605 Pain in left leg: Secondary | ICD-10-CM | POA: Diagnosis present

## 2020-12-17 DIAGNOSIS — Z79899 Other long term (current) drug therapy: Secondary | ICD-10-CM | POA: Diagnosis present

## 2020-12-17 DIAGNOSIS — M5441 Lumbago with sciatica, right side: Secondary | ICD-10-CM | POA: Insufficient documentation

## 2020-12-17 DIAGNOSIS — G8929 Other chronic pain: Secondary | ICD-10-CM | POA: Insufficient documentation

## 2020-12-17 DIAGNOSIS — Z79891 Long term (current) use of opiate analgesic: Secondary | ICD-10-CM | POA: Diagnosis present

## 2020-12-17 DIAGNOSIS — F1120 Opioid dependence, uncomplicated: Secondary | ICD-10-CM | POA: Insufficient documentation

## 2020-12-17 DIAGNOSIS — G894 Chronic pain syndrome: Secondary | ICD-10-CM | POA: Insufficient documentation

## 2020-12-17 DIAGNOSIS — M25559 Pain in unspecified hip: Secondary | ICD-10-CM | POA: Diagnosis not present

## 2020-12-17 MED ORDER — OXYCODONE HCL 5 MG PO TABS: 5.0000 mg | ORAL_TABLET | Freq: Three times a day (TID) | ORAL | 0 refills | Status: DC | PRN

## 2020-12-17 NOTE — Patient Instructions (Signed)
____________________________________________________________________________________________  Medication Recommendations and Reminders  Applies to: All patients receiving prescriptions (written and/or electronic).  Medication Rules & Regulations: These rules and regulations exist for your safety and that of others. They are not flexible and neither are we. Dismissing or ignoring them will be considered "non-compliance" with medication therapy, resulting in complete and irreversible termination of such therapy. (See document titled "Medication Rules" for more details.) In all conscience, because of safety reasons, we cannot continue providing a therapy where the patient does not follow instructions.  Pharmacy of record:   Definition: This is the pharmacy where your electronic prescriptions will be sent.   We do not endorse any particular pharmacy, however, we have experienced problems with [facility] not securing enough medication supply for the community.  We do not restrict you in your choice of pharmacy. However, once we write for your prescriptions, we will NOT be re-sending more prescriptions to fix restricted supply problems created by your pharmacy, or your insurance.   The pharmacy listed in the electronic medical record should be the one where you want electronic prescriptions to be sent.  If you choose to change pharmacy, simply notify our nursing staff.  Recommendations:  Keep all of your pain medications in a safe place, under lock and key, even if you live alone. We will NOT replace lost, stolen, or damaged medication.  After you fill your prescription, take 1 week's worth of pills and put them away in a safe place. You should keep a separate, properly labeled bottle for this purpose. The remainder should be kept in the original bottle. Use this as your primary supply, until it runs out. Once it's gone, then you know that you have 1 week's worth of medicine, and it is time to come  in for a prescription refill. If you do this correctly, it is unlikely that you will ever run out of medicine.  To make sure that the above recommendation works, it is very important that you make sure your medication refill appointments are scheduled at least 1 week before you run out of medicine. To do this in an effective manner, make sure that you do not leave the office without scheduling your next medication management appointment. Always ask the nursing staff to show you in your prescription , when your medication will be running out. Then arrange for the receptionist to get you a return appointment, at least 7 days before you run out of medicine. Do not wait until you have 1 or 2 pills left, to come in. This is very poor planning and does not take into consideration that we may need to cancel appointments due to bad weather, sickness, or emergencies affecting our staff.  DO NOT ACCEPT A "Partial Fill": If for any reason your pharmacy does not have enough pills/tablets to completely fill or refill your prescription, do not allow for a "partial fill". The law allows the pharmacy to complete that prescription within 72 hours, without requiring a new prescription. If they do not fill the rest of your prescription within those 72 hours, you will need a separate prescription to fill the remaining amount, which we will NOT provide. If the reason for the partial fill is your insurance, you will need to talk to the pharmacist about payment alternatives for the remaining tablets, but again, DO NOT ACCEPT A PARTIAL FILL, unless you can trust your pharmacist to obtain the remainder of the pills within 72 hours.  Prescription refills and/or changes in medication(s):     Prescription refills, and/or changes in dose or medication, will be conducted only during scheduled medication management appointments. (Applies to both, written and electronic prescriptions.)  No refills on procedure days. No medication will be  changed or started on procedure days. No changes, adjustments, and/or refills will be conducted on a procedure day. Doing so will interfere with the diagnostic portion of the procedure.  No phone refills. No medications will be "called into the pharmacy".  No Fax refills.  No weekend refills.  No Holliday refills.  No after hours refills.  Remember:  Business hours are:  Monday to Thursday 8:00 AM to 4:00 PM Provider's Schedule: Damia Bobrowski, MD - Appointments are:  Medication management: Monday and Wednesday 8:00 AM to 4:00 PM Procedure day: Tuesday and Thursday 7:30 AM to 4:00 PM Bilal Lateef, MD - Appointments are:  Medication management: Tuesday and Thursday 8:00 AM to 4:00 PM Procedure day: Monday and Wednesday 7:30 AM to 4:00 PM (Last update: 04/02/2020) ____________________________________________________________________________________________   ____________________________________________________________________________________________  CBD (cannabidiol) WARNING  Applicable to: All individuals currently taking or considering taking CBD (cannabidiol) and, more important, all patients taking opioid analgesic controlled substances (pain medication). (Example: oxycodone; oxymorphone; hydrocodone; hydromorphone; morphine; methadone; tramadol; tapentadol; fentanyl; buprenorphine; butorphanol; dextromethorphan; meperidine; codeine; etc.)  Legal status: CBD remains a Schedule I drug prohibited for any use. CBD is illegal with one exception. In the [place], CBD has a limited Food and Drug Administration (FDA) approval for the treatment of two specific types of epilepsy disorders. Only one CBD product has been approved by the FDA for this purpose: "Epidiolex". FDA is aware that some companies are marketing products containing cannabis and cannabis-derived compounds in ways that violate the Federal Food, Drug and Cosmetic Act (FD&C Act) and that may put the health and safety  of consumers at risk. The FDA, a Federal agency, has not enforced the CBD status since 2018.   Legality: Some manufacturers ship CBD products nationally, which is illegal. Often such products are sold online and are therefore available throughout the country. CBD is openly sold in head shops and health food stores in some states where such sales have not been explicitly legalized. Selling unapproved products with unsubstantiated therapeutic claims is not only a violation of the law, but also can put patients at risk, as these products have not been proven to be safe or effective. Federal illegality makes it difficult to conduct research on CBD.  Reference: "FDA Regulation of Cannabis and Cannabis-Derived Products, Including Cannabidiol (CBD)" - https://www.fda.gov/news-events/public-health-focus/fda-regulation-cannabis-and-cannabis-derived-products-including-cannabidiol-cbd  Warning: CBD is not FDA approved and has not undergo the same manufacturing controls as prescription drugs.  This means that the purity and safety of available CBD may be questionable. Most of the time, despite manufacturer's claims, it is contaminated with THC (delta-9-tetrahydrocannabinol - the chemical in marijuana responsible for the "HIGH").  When this is the case, the THC contaminant will trigger a positive urine drug screen (UDS) test for Marijuana (carboxy-THC). Because a positive UDS for any illicit substance is a violation of our medication agreement, your opioid analgesics (pain medicine) may be permanently discontinued.  MORE ABOUT CBD  General Information: CBD  is a derivative of the Marijuana (cannabis sativa) plant discovered in 1940. It is one of the 113 identified substances found in Marijuana. It accounts for up to 40% of the plant's extract. As of 2018, preliminary clinical studies on CBD included research for the treatment of anxiety, movement disorders, and pain. CBD is available and consumed in multiple forms,  including   inhalation of smoke or vapor, as an aerosol spray, and by mouth. It may be supplied as an oil containing CBD, capsules, dried cannabis, or as a liquid solution. CBD is thought not to be as psychoactive as THC (delta-9-tetrahydrocannabinol - the chemical in marijuana responsible for the "HIGH"). Studies suggest that CBD may interact with different biological target receptors in the body, including cannabinoid and other neurotransmitter receptors. As of 2018 the mechanism of action for its biological effects has not been determined.  Side-effects  Adverse reactions: Dry mouth, diarrhea, decreased appetite, fatigue, drowsiness, malaise, weakness, sleep disturbances, and others.  Drug interactions: CBC may interact with other medications such as blood-thinners. (Last update: 04/19/2020) ____________________________________________________________________________________________   ____________________________________________________________________________________________  Medication Rules  Purpose: To inform patients, and their family members, of our rules and regulations.  Applies to: All patients receiving prescriptions (written or electronic).  Pharmacy of record: Pharmacy where electronic prescriptions will be sent. If written prescriptions are taken to a different pharmacy, please inform the nursing staff. The pharmacy listed in the electronic medical record should be the one where you would like electronic prescriptions to be sent.  Electronic prescriptions: In compliance with the [place] Strengthen Opioid Misuse Prevention (STOP) Act of 2017 (Session Law 2017-74/H243), effective September 13, 2018, all controlled substances must be electronically prescribed. Calling prescriptions to the pharmacy will cease to exist.  Prescription refills: Only during scheduled appointments. Applies to all prescriptions.  NOTE: The following applies primarily to controlled substances (Opioid*  Pain Medications).   Type of encounter (visit): For patients receiving controlled substances, face-to-face visits are required. (Not an option or up to the patient.)  Patient's responsibilities: 1. Pain Pills: Bring all pain pills to every appointment (except for procedure appointments). 2. Pill Bottles: Bring pills in original pharmacy bottle. Always bring the newest bottle. Bring bottle, even if empty. 3. Medication refills: You are responsible for knowing and keeping track of what medications you take and those you need refilled. The day before your appointment: write a list of all prescriptions that need to be refilled. The day of the appointment: give the list to the admitting nurse. Prescriptions will be written only during appointments. No prescriptions will be written on procedure days. If you forget a medication: it will not be "Called in", "Faxed", or "electronically sent". You will need to get another appointment to get these prescribed. No early refills. Do not call asking to have your prescription filled early. 4. Prescription Accuracy: You are responsible for carefully inspecting your prescriptions before leaving our office. Have the discharge nurse carefully go over each prescription with you, before taking them home. Make sure that your name is accurately spelled, that your address is correct. Check the name and dose of your medication to make sure it is accurate. Check the number of pills, and the written instructions to make sure they are clear and accurate. Make sure that you are given enough medication to last until your next medication refill appointment. 5. Taking Medication: Take medication as prescribed. When it comes to controlled substances, taking less pills or less frequently than prescribed is permitted and encouraged. Never take more pills than instructed. Never take medication more frequently than prescribed.  6. Inform other Doctors: Always inform, all of your  healthcare providers, of all the medications you take. 7. Pain Medication from other Providers: You are not allowed to accept any additional pain medication from any other Doctor or Healthcare provider. There are two exceptions to this rule. (see below) In   the event that you require additional pain medication, you are responsible for notifying us, as stated below. 8. Cough Medicine: Often these contain an opioid, such as codeine or hydrocodone. Never accept or take cough medicine containing these opioids if you are already taking an opioid* medication. The combination may cause respiratory failure and death. 9. Medication Agreement: You are responsible for carefully reading and following our Medication Agreement. This must be signed before receiving any prescriptions from our practice. Safely store a copy of your signed Agreement. Violations to the Agreement will result in no further prescriptions. (Additional copies of our Medication Agreement are available upon request.) 10. Laws, Rules, & Regulations: All patients are expected to follow all Federal and State Laws, Statutes, Rules, & Regulations. Ignorance of the Laws does not constitute a valid excuse.  11. Illegal drugs and Controlled Substances: The use of illegal substances (including, but not limited to marijuana and its derivatives) and/or the illegal use of any controlled substances is strictly prohibited. Violation of this rule may result in the immediate and permanent discontinuation of any and all prescriptions being written by our practice. The use of any illegal substances is prohibited. 12. Adopted CDC guidelines & recommendations: Target dosing levels will be at or below 60 MME/day. Use of benzodiazepines** is not recommended.  Exceptions: There are only two exceptions to the rule of not receiving pain medications from other Healthcare Providers. 1. Exception #1 (Emergencies): In the event of an emergency (i.e.: accident requiring emergency  care), you are allowed to receive additional pain medication. However, you are responsible for: As soon as you are able, call our office [phone], at any time of the day or night, and leave a message stating your name, the date and nature of the emergency, and the name and dose of the medication prescribed. In the event that your call is answered by a member of our staff, make sure to document and save the date, time, and the name of the person that took your information.  2. Exception #2 (Planned Surgery): In the event that you are scheduled by another doctor or dentist to have any type of surgery or procedure, you are allowed (for a period no longer than 30 days), to receive additional pain medication, for the acute post-op pain. However, in this case, you are responsible for picking up a copy of our "Post-op Pain Management for Surgeons" handout, and giving it to your surgeon or dentist. This document is available at our office, and does not require an appointment to obtain it. Simply go to our office during business hours (Monday-Thursday from 8:00 AM to 4:00 PM) (Friday 8:00 AM to 12:00 Noon) or if you have a scheduled appointment with us, prior to your surgery, and ask for it by name. In addition, you are responsible for: calling our office [phone], at any time of the day or night, and leaving a message stating your name, name of your surgeon, type of surgery, and date of procedure or surgery. Failure to comply with your responsibilities may result in termination of therapy involving the controlled substances.  *Opioid medications include: morphine, codeine, oxycodone, oxymorphone, hydrocodone, hydromorphone, meperidine, tramadol, tapentadol, buprenorphine, fentanyl, methadone. **Benzodiazepine medications include: diazepam (Valium), alprazolam (Xanax), clonazepam (Klonopine), lorazepam (Ativan), clorazepate (Tranxene), chlordiazepoxide (Librium), estazolam (Prosom), oxazepam (Serax),  temazepam (Restoril), triazolam (Halcion) (Last updated: 08/11/2020) ____________________________________________________________________________________________    

## 2020-12-17 NOTE — Progress Notes (Signed)
Nursing Pain Medication Assessment:  Safety precautions to be maintained throughout the outpatient stay will include: orient to surroundings, keep bed in low position, maintain call bell within reach at all times, provide assistance with transfer out of bed and ambulation.  Medication Inspection Compliance: Pill count conducted under aseptic conditions, in front of the patient. Neither the pills nor the bottle was removed from the patient's sight at any time. Once count was completed pills were immediately returned to the patient in their original bottle.  Medication: Oxycodone IR Pill/Patch Count: 43 of 90 pills remain Pill/Patch Appearance: Markings consistent with prescribed medication Bottle Appearance: Standard pharmacy container. Clearly labeled. Filled Date: 3 / 39 / 22 Last Medication intake:  YesterdaySafety precautions to be maintained throughout the outpatient stay will include: orient to surroundings, keep bed in low position, maintain call bell within reach at all times, provide assistance with transfer out of bed and ambulation.

## 2021-02-18 DIAGNOSIS — G629 Polyneuropathy, unspecified: Secondary | ICD-10-CM | POA: Insufficient documentation

## 2021-03-14 ENCOUNTER — Other Ambulatory Visit: Payer: Self-pay | Admitting: Pain Medicine

## 2021-03-14 DIAGNOSIS — M792 Neuralgia and neuritis, unspecified: Secondary | ICD-10-CM

## 2021-03-18 ENCOUNTER — Encounter: Payer: Medicare Other | Admitting: Pain Medicine

## 2021-03-23 NOTE — Progress Notes (Signed)
PROVIDER NOTE: Information contained herein reflects review and annotations entered in association with encounter. Interpretation of such information and data should be left to medically-trained personnel. Information provided to patient can be located elsewhere in the medical record under "Patient Instructions". Document created using STT-dictation technology, any transcriptional errors that may result from process are unintentional.    Patient: Ricky Mercer  Service Category: E/M  Provider: Gaspar Cola, MD  DOB: [birth year]  DOS: 03/24/2021  Specialty: Interventional Pain Management  MRN: [id number]  Setting: Ambulatory outpatient  PCP: Sofie Hartigan, MD  Type: Established Patient    Referring Provider: Sofie Hartigan, MD  Location: Office  Delivery: Face-to-face     HPI  Mr. CHANE COWDEN, a 70 y.o. year old male, is here today because of his Chronic pain syndrome [G89.4]. Mr. Kimberlin primary complain today is Foot Pain (bilateral) Last encounter: My last encounter with him was on 03/14/2021. Pertinent problems: Mr. Hadden has Hand weakness; Polyradiculopathy; Chronic low back pain (Primary Area of Pain) (Bilateral) (L>R); Chronic lower extremity pain (Third area of Pain) (Bilateral) (L>R); Chronic lumbar radicular pain (Bilateral) (L>R) (Bilateral S1 dermatomal distribution); Chronic hip pain (Secondary area of Pain) (Bilateral) (L>R); Chronic upper back pain (Bilateral) (L>R); Chronic neck pain (neck<shoulder) (L>R); Chronic foot pain; Chronic knee pain (Right); Cervical spondylosis; 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); Lumbar spondylosis; Cervical facet hypertrophy; Cervical facet syndrome (R>L); History of prostate cancer; Radicular pain of shoulder; Peripheral neuropathy (HCC) (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); Lumbar facet syndrome (Bilateral) (L>R); Chronic shoulder pain (Bilateral) (L>R); Chronic sacroiliac joint pain (Bilateral)  (L>R); Chronic musculoskeletal pain; Pain of right foot; Neurogenic pain; Chronic pain syndrome; Bilateral arm pain; DDD (degenerative disc disease), cervical; DDD (degenerative disc disease), lumbosacral; Cervicogenic headache; Cervico-occipital neuralgia; Osteoarthritis of spine with radiculopathy, lumbar region; Decreased sensation; Numbness and tingling; Weakness of left leg; Bilateral leg and foot pain; Cervicalgia; Chronic upper extremity pain (Bilateral) (L>R); Abnormal MRI, cervical spine (12/07/2019); Burning sensation of feet; Numbness and tingling of both legs; Spondylosis without myelopathy or radiculopathy, cervical region; Osteoarthritis involving multiple joints; Chronic mid back pain; and Total body pain on their pertinent problem list. Pain Assessment: Severity of Chronic pain is reported as a 5 /10. Location: Foot Right, Left/ . Onset: More than a month ago. Quality: Tingling, Numbness. Timing: Constant. Modifying factor(s): Gabapentin, Oxycodone. Vitals:  height is 5' 9"  (1.753 m) and weight is 165 lb (74.8 kg). His temporal temperature is 98.2 F (36.8 C). His blood pressure is 130/86 and his pulse is 80. His respiration is 14 and oxygen saturation is 90%.   Reason for encounter: medication management.   The patient indicates doing well with the current medication regimen. No adverse reactions or side effects reported to the medications.   RTCB: 06/22/2021 Nonopioids transferred 06/25/2020: Vitamin D3, Neurontin, and Mobic.  Pharmacotherapy Assessment  Analgesic: Oxycodone 5 mg, 1 tab PO q 8 hrs (15 mg/day of oxycodone) MME/day: 22.5 mg/day.   Monitoring: [place] PMP: PDMP reviewed during this encounter.       Pharmacotherapy: No side-effects or adverse reactions reported. Compliance: No problems identified. Effectiveness: Clinically acceptable.  Landis Martins, RN  03/24/2021  3:01 PM  Sign when Signing Visit Nursing Pain Medication Assessment:  Safety precautions to be  maintained throughout the outpatient stay will include: orient to surroundings, keep bed in low position, maintain call bell within reach at all times, provide assistance with transfer out of bed and ambulation.  Medication Inspection Compliance: Pill count conducted under aseptic conditions, in front of the patient. Neither the pills nor the bottle was removed from the patient's sight at any time. Once count was completed pills were immediately returned to the patient in their original bottle.  Medication: Oxycodone IR Pill/Patch Count:  28 of 90 pills remain Pill/Patch Appearance: Markings consistent with prescribed medication Bottle Appearance: Standard pharmacy container. Clearly labeled. Filled Date: 6 / 37 / 2022 Last Medication intake:  Today    UDS:  Summary  Date Value Ref Range Status  03/11/2020 Note  Corrected    Comment:    ==================================================================== [employer] Select 13 (MW) ==================================================================== Test                             Result       Flag       Units  Drug Present and Declared for Prescription Verification   Oxycodone                      97           EXPECTED   ng/mg creat   Oxymorphone                    930          EXPECTED   ng/mg creat   Noroxycodone                   601          EXPECTED   ng/mg creat   Noroxymorphone                 369          EXPECTED   ng/mg creat    Sources of oxycodone are scheduled prescription medications.    Oxymorphone, noroxycodone, and noroxymorphone are expected    metabolites of oxycodone. Oxymorphone is also available as a    scheduled prescription medication.  Drug Present not Declared for Prescription Verification   Oxazepam                       12           UNEXPECTED ng/mg creat    Oxazepam may be administered as a scheduled prescription medication;    it is also an expected metabolite of other benzodiazepine drugs,    including  diazepam, chlordiazepoxide, prazepam, clorazepate,    halazepam, and temazepam.  Drug Absent but Declared for Prescription Verification   Hydrocodone                    Not Detected UNEXPECTED ng/mg creat ==================================================================== Test                      Result    Flag   Units      Ref Range   Creatinine              227              mg/dL      >=20 ==================================================================== Declared Medications:  The flagging and interpretation on this report are based on the  following declared medications.  Unexpected results may arise from  inaccuracies in the declared medications.   **Note: The testing scope of this panel includes these medications:   Hydrocodone (Norco)  Oxycodone (Roxicodone)   **Note: The testing  scope of this panel does not include the  following reported medications:   Acetaminophen (Norco)  Aspirin  Cholecalciferol  Gabapentin (Neurontin)  Pantoprazole (Protonix)  Simvastatin (Zocor) ==================================================================== For clinical consultation, please call [phone]. ====================================================================      ROS  Constitutional: Denies any fever or chills Gastrointestinal: No reported hemesis, hematochezia, vomiting, or acute GI distress Musculoskeletal: Denies any acute onset joint swelling, redness, loss of ROM, or weakness Neurological: No reported episodes of acute onset apraxia, aphasia, dysarthria, agnosia, amnesia, paralysis, loss of coordination, or loss of consciousness  Medication Review  Cholecalciferol, aspirin, buPROPion, gabapentin, meloxicam, nortriptyline, oxyCODONE, predniSONE, and simvastatin  History Review  Allergy: Mr. Janusz has No Known Allergies. Drug: Mr. Yadav  reports no history of drug use. Alcohol:  reports no history of alcohol use. Tobacco:  reports that he has never  smoked. He has never used smokeless tobacco. Social: Mr. Bascom  reports that he has never smoked. He has never used smokeless tobacco. He reports that he does not drink alcohol and does not use drugs. Medical:  has a past medical history of Acute postoperative pain (03/21/2017), Anginal pain ([place]), Bilateral arm pain, Cardiomyopathy ([place]), Cardiomyopathy, idiopathic ([place]), Chronic back pain, Chronic pain syndrome, Depression, Diverticulosis, Dupuytren's contracture, Erosive gastritis, Fibromyalgia, Foot pain, bilateral, GERD (gastroesophageal reflux disease), Hand weakness, Hyperlipidemia, Knee pain, Midline low back pain, Neuromuscular disorder (HCC), Opioid dependence ([place]), Osteoarthritis, Pain in neck, Palpitations, Prostate cancer ([place]), Reactive airway disease, Seasonal allergies, Situational anxiety, Skin cancer, Sleep apnea, and Tubular adenoma of colon. Surgical: Mr. Marcucci  has a past surgical history that includes Nose surgery; Hand surgery (Bilateral); Colonoscopy (10/24/2012); Upper gi endoscopy (12/18/2012); Tonsillectomy (2009); Skin cancer excision (Left, 2015); Mass excision (Right, 07/18/2015); Esophagogastroduodenoscopy (egd) with propofol (N/A, 10/28/2015); prostate seeding; Hernia repair; Cholecystectomy (N/A, 12/02/2015); Esophagogastroduodenoscopy (N/A, 11/17/2017); Colonoscopy with propofol (N/A, 11/17/2017); Esophagogastroduodenoscopy (N/A, 03/24/2020); and Colonoscopy with propofol (N/A, 11/03/2020). Family: family history includes Colon cancer in his brother; Hyperlipidemia in his sister; Lung cancer in his father; Prostate cancer in his brother.  Laboratory Chemistry Profile   Renal Lab Results  Component Value Date   BUN 11 08/04/2020   CREATININE 0.99 08/04/2020   BCR 12 08/06/2019   GFRAA 99 08/06/2019   GFRNONAA >60 08/04/2020    Hepatic Lab Results  Component Value Date   AST 20 08/04/2020   ALT 15 08/04/2020   ALBUMIN 3.8 08/04/2020   ALKPHOS 47 08/04/2020   LIPASE 24  08/04/2020    Electrolytes Lab Results  Component Value Date   NA 139 08/04/2020   K 4.3 08/04/2020   CL 102 08/04/2020   CALCIUM 8.8 (L) 08/04/2020   MG 2.0 08/06/2019    Bone Lab Results  Component Value Date   25OHVITD1 49 08/06/2019   25OHVITD2 <1.0 08/06/2019   25OHVITD3 49 08/06/2019    Inflammation (CRP: Acute Phase) (ESR: Chronic Phase) Lab Results  Component Value Date   CRP <1 08/06/2019   ESRSEDRATE 2 08/06/2019          Note: Above Lab results reviewed.  Recent Imaging Review  CT CHEST WO CONTRAST CLINICAL DATA:  Right lower chest pain for 5 months, increasing, no known injury  EXAM: CT CHEST WITHOUT CONTRAST  TECHNIQUE: Multidetector CT imaging of the chest was performed following the standard protocol without IV contrast.  COMPARISON:  11/15/2013  FINDINGS: Cardiovascular: No significant vascular findings. Normal heart size. No pericardial effusion.  Mediastinum/Nodes: No enlarged mediastinal, hilar, or axillary lymph nodes. Thyroid gland, trachea,  and esophagus demonstrate no significant findings.  Lungs/Pleura: There is a small, definitively benign calcified nodule of the right upper lobe (series 3, image 40). There are occasional tiny noncalcified nodules, for example a 3 mm nodule of the lateral segment right middle lobe, stable compared to prior examination dated 2015 and definitively benign (series 3, image 96). No pleural effusion or pneumothorax.  Upper Abdomen: No acute abnormality.  Musculoskeletal: No chest wall mass or suspicious bone lesions identified.  IMPRESSION: 1. No non-contrast CT findings of the chest to explain right-sided chest pain.  2. Occasional tiny, stable, and definitively benign small pulmonary nodules for which no further follow-up is required.  Electronically Signed   By: Eddie Candle M.D.   On: 09/17/2020 13:26 Note: Reviewed        Physical Exam  General appearance: Well nourished, well developed,  and well hydrated. In no apparent acute distress Mental status: Alert, oriented x 3 (person, place, & time)       Respiratory: No evidence of acute respiratory distress Eyes: PERLA Vitals: BP 130/86   Pulse 80   Temp 98.2 F (36.8 C) (Temporal)   Resp 14   Ht 5' 9"  (1.753 m)   Wt 165 lb (74.8 kg)   SpO2 90%   BMI 24.37 kg/m  BMI: Estimated body mass index is 24.37 kg/m as calculated from the following:   Height as of this encounter: 5' 9"  (1.753 m).   Weight as of this encounter: 165 lb (74.8 kg). Ideal: Ideal body weight: 70.7 kg (155 lb 13.8 oz) Adjusted ideal body weight: 72.4 kg (159 lb 8.3 oz)  Assessment   Status Diagnosis  Controlled Controlled Controlled 1. Chronic pain syndrome   2. Chronic low back pain (Primary Area of Pain) (Bilateral) (L>R)   3. Chronic lower extremity pain (Third area of Pain) (Bilateral) (L>R)   4. Chronic hip pain (Secondary area of Pain) (Bilateral) (L>R)   5. Cervicalgia   6. Cervical facet syndrome (R>L)   7. Pharmacologic therapy   8. Chronic use of opiate for therapeutic purpose   9. Uncomplicated opioid dependence ([place])   10. Encounter for medication management      Updated Problems: Problem  Acute Postoperative Pain (Resolved)    Plan of Care  Problem-specific:  No problem-specific Assessment & Plan notes found for this encounter.  Mr. MADDOCK FINIGAN has a current medication list which includes the following long-term medication(s): bupropion, gabapentin, gabapentin, nortriptyline, oxycodone, [START ON 04/23/2021] oxycodone, [START ON 05/23/2021] oxycodone, simvastatin, cholecalciferol, gabapentin, and meloxicam.  Pharmacotherapy (Medications Ordered): Meds ordered this encounter  Medications   oxyCODONE (OXY IR/ROXICODONE) 5 MG immediate release tablet    Sig: Take 1 tablet (5 mg total) by mouth every 8 (eight) hours as needed for severe pain. Must last 30 days    Dispense:  90 tablet    Refill:  0    Not a duplicate. Do  NOT delete! Dispense 1 day early if closed on fill date. Warn not to take CNS-depressants 8 hours before or after taking opioid. Do not send refill request. Renewal requires appointment.   oxyCODONE (OXY IR/ROXICODONE) 5 MG immediate release tablet    Sig: Take 1 tablet (5 mg total) by mouth every 8 (eight) hours as needed for severe pain. Must last 30 days    Dispense:  90 tablet    Refill:  0    Not a duplicate. Do NOT delete! Dispense 1 day early if closed on fill date. Warn  not to take CNS-depressants 8 hours before or after taking opioid. Do not send refill request. Renewal requires appointment.   oxyCODONE (OXY IR/ROXICODONE) 5 MG immediate release tablet    Sig: Take 1 tablet (5 mg total) by mouth every 8 (eight) hours as needed for severe pain. Must last 30 days    Dispense:  90 tablet    Refill:  0    Not a duplicate. Do NOT delete! Dispense 1 day early if closed on fill date. Warn not to take CNS-depressants 8 hours before or after taking opioid. Do not send refill request. Renewal requires appointment.    Orders:  Orders Placed This Encounter  Procedures   [employer] Select 13 (MW), Urine    Volume: 30 ml(s). Minimum 3 ml of urine is needed. Document temperature of fresh sample. Indications: Long term (current) use of opiate analgesic (I33.825)    Order Specific Question:   Release to patient    Answer:   Immediate    Follow-up plan:   Return in about 3 months (around 06/22/2021) for evaluation day (F2F) (MM).      Considering:   Therapeutic right cervical facet RFA #1  Therapeutic right lumbar facet RFA #1  Therapeutic right SI joint RFA #1  Diagnostic bilateral Hip joint injection  Diagnostic Caudal ESI + diagnostic epidurogram  Diagnostic right Knee injection  Diagnostic right Genicular NB  Diagnostic bilateral IA shoulder injection  Diagnostic bilateral suprascapular NB    Palliative PRN treatment(s):   Palliative bilateral cervical facet block #2  Palliative  right lumbar facet block #4  Palliative left lumbar facet block #4  Palliative right SI joint block #3  Palliative left SI joint block #3  Palliative left lumbar facet RFA (last done on 03/21/2017)  Palliative left SI joint RFA (last done on 03/21/2017)  Therapeutic/palliative left CESI #4  Palliative left cervical facet RFA #2 (last done 03/11/2020)     Recent Visits No visits were found meeting these conditions. Showing recent visits within past 90 days and meeting all other requirements Today's Visits Date Type Provider Dept  03/24/21 Office Visit Milinda Pointer, MD [facility]  Showing today's visits and meeting all other requirements Future Appointments Date Type Provider Dept  06/17/21 Appointment Milinda Pointer, MD [facility]  Showing future appointments within next 90 days and meeting all other requirements I discussed the assessment and treatment plan with the patient. The patient was provided an opportunity to ask questions and all were answered. The patient agreed with the plan and demonstrated an understanding of the instructions.  Patient advised to call back or seek an in-person evaluation if the symptoms or condition worsens.  Duration of encounter: 30 minutes.  Note by: Gaspar Cola, MD Date: 03/24/2021; Time: 4:07 PM

## 2021-03-24 ENCOUNTER — Ambulatory Visit: Payer: Medicare Other | Attending: Pain Medicine | Admitting: Pain Medicine

## 2021-03-24 ENCOUNTER — Other Ambulatory Visit: Payer: Self-pay

## 2021-03-24 VITALS — BP 130/86 | HR 80 | Temp 98.2°F | Resp 14 | Ht 69.0 in | Wt 165.0 lb

## 2021-03-24 DIAGNOSIS — Z79891 Long term (current) use of opiate analgesic: Secondary | ICD-10-CM

## 2021-03-24 DIAGNOSIS — M542 Cervicalgia: Secondary | ICD-10-CM

## 2021-03-24 DIAGNOSIS — M25559 Pain in unspecified hip: Secondary | ICD-10-CM

## 2021-03-24 DIAGNOSIS — M79605 Pain in left leg: Secondary | ICD-10-CM

## 2021-03-24 DIAGNOSIS — M5441 Lumbago with sciatica, right side: Secondary | ICD-10-CM

## 2021-03-24 DIAGNOSIS — M47812 Spondylosis without myelopathy or radiculopathy, cervical region: Secondary | ICD-10-CM

## 2021-03-24 DIAGNOSIS — F1120 Opioid dependence, uncomplicated: Secondary | ICD-10-CM

## 2021-03-24 DIAGNOSIS — G894 Chronic pain syndrome: Secondary | ICD-10-CM

## 2021-03-24 DIAGNOSIS — M79604 Pain in right leg: Secondary | ICD-10-CM | POA: Diagnosis not present

## 2021-03-24 DIAGNOSIS — G8929 Other chronic pain: Secondary | ICD-10-CM

## 2021-03-24 DIAGNOSIS — Z79899 Other long term (current) drug therapy: Secondary | ICD-10-CM

## 2021-03-24 DIAGNOSIS — M5442 Lumbago with sciatica, left side: Secondary | ICD-10-CM | POA: Diagnosis not present

## 2021-03-24 MED ORDER — OXYCODONE HCL 5 MG PO TABS: 5.0000 mg | ORAL_TABLET | Freq: Three times a day (TID) | ORAL | 0 refills | Status: DC | PRN

## 2021-03-24 NOTE — Progress Notes (Signed)
Nursing Pain Medication Assessment:  Safety precautions to be maintained throughout the outpatient stay will include: orient to surroundings, keep bed in low position, maintain call bell within reach at all times, provide assistance with transfer out of bed and ambulation.  Medication Inspection Compliance: Pill count conducted under aseptic conditions, in front of the patient. Neither the pills nor the bottle was removed from the patient's sight at any time. Once count was completed pills were immediately returned to the patient in their original bottle.  Medication: Oxycodone IR Pill/Patch Count:  28 of 90 pills remain Pill/Patch Appearance: Markings consistent with prescribed medication Bottle Appearance: Standard pharmacy container. Clearly labeled. Filled Date: 6 / 80 / 2022 Last Medication intake:  Today

## 2021-03-24 NOTE — Patient Instructions (Signed)
____________________________________________________________________________________________  Medication Rules  Purpose: To inform patients, and their family members, of our rules and regulations.  Applies to: All patients receiving prescriptions (written or electronic).  Pharmacy of record: Pharmacy where electronic prescriptions will be sent. If written prescriptions are taken to a different pharmacy, please inform the nursing staff. The pharmacy listed in the electronic medical record should be the one where you would like electronic prescriptions to be sent.  Electronic prescriptions: In compliance with the [place] Strengthen Opioid Misuse Prevention (STOP) Act of 2017 (Session Law 2017-74/H243), effective September 13, 2018, all controlled substances must be electronically prescribed. Calling prescriptions to the pharmacy will cease to exist.  Prescription refills: Only during scheduled appointments. Applies to all prescriptions.  NOTE: The following applies primarily to controlled substances (Opioid* Pain Medications).   Type of encounter (visit): For patients receiving controlled substances, face-to-face visits are required. (Not an option or up to the patient.)  Patient's responsibilities: Pain Pills: Bring all pain pills to every appointment (except for procedure appointments). Pill Bottles: Bring pills in original pharmacy bottle. Always bring the newest bottle. Bring bottle, even if empty. Medication refills: You are responsible for knowing and keeping track of what medications you take and those you need refilled. The day before your appointment: write a list of all prescriptions that need to be refilled. The day of the appointment: give the list to the admitting nurse. Prescriptions will be written only during appointments. No prescriptions will be written on procedure days. If you forget a medication: it will not be "Called in", "Faxed", or "electronically sent". You will  need to get another appointment to get these prescribed. No early refills. Do not call asking to have your prescription filled early. Prescription Accuracy: You are responsible for carefully inspecting your prescriptions before leaving our office. Have the discharge nurse carefully go over each prescription with you, before taking them home. Make sure that your name is accurately spelled, that your address is correct. Check the name and dose of your medication to make sure it is accurate. Check the number of pills, and the written instructions to make sure they are clear and accurate. Make sure that you are given enough medication to last until your next medication refill appointment. Taking Medication: Take medication as prescribed. When it comes to controlled substances, taking less pills or less frequently than prescribed is permitted and encouraged. Never take more pills than instructed. Never take medication more frequently than prescribed.  Inform other Doctors: Always inform, all of your healthcare providers, of all the medications you take. Pain Medication from other Providers: You are not allowed to accept any additional pain medication from any other Doctor or Healthcare provider. There are two exceptions to this rule. (see below) In the event that you require additional pain medication, you are responsible for notifying us, as stated below. Cough Medicine: Often these contain an opioid, such as codeine or hydrocodone. Never accept or take cough medicine containing these opioids if you are already taking an opioid* medication. The combination may cause respiratory failure and death. Medication Agreement: You are responsible for carefully reading and following our Medication Agreement. This must be signed before receiving any prescriptions from our practice. Safely store a copy of your signed Agreement. Violations to the Agreement will result in no further prescriptions. (Additional copies of our  Medication Agreement are available upon request.) Laws, Rules, & Regulations: All patients are expected to follow all Federal and State Laws, Statutes, Rules, & Regulations. Ignorance of   the Laws does not constitute a valid excuse.  Illegal drugs and Controlled Substances: The use of illegal substances (including, but not limited to marijuana and its derivatives) and/or the illegal use of any controlled substances is strictly prohibited. Violation of this rule may result in the immediate and permanent discontinuation of any and all prescriptions being written by our practice. The use of any illegal substances is prohibited. Adopted CDC guidelines & recommendations: Target dosing levels will be at or below 60 MME/day. Use of benzodiazepines** is not recommended.  Exceptions: There are only two exceptions to the rule of not receiving pain medications from other Healthcare Providers. Exception #1 (Emergencies): In the event of an emergency (i.e.: accident requiring emergency care), you are allowed to receive additional pain medication. However, you are responsible for: As soon as you are able, call our office [phone], at any time of the day or night, and leave a message stating your name, the date and nature of the emergency, and the name and dose of the medication prescribed. In the event that your call is answered by a member of our staff, make sure to document and save the date, time, and the name of the person that took your information.  Exception #2 (Planned Surgery): In the event that you are scheduled by another doctor or dentist to have any type of surgery or procedure, you are allowed (for a period no longer than 30 days), to receive additional pain medication, for the acute post-op pain. However, in this case, you are responsible for picking up a copy of our "Post-op Pain Management for Surgeons" handout, and giving it to your surgeon or dentist. This document is available at our office, and  does not require an appointment to obtain it. Simply go to our office during business hours (Monday-Thursday from 8:00 AM to 4:00 PM) (Friday 8:00 AM to 12:00 Noon) or if you have a scheduled appointment with us, prior to your surgery, and ask for it by name. In addition, you are responsible for: calling our office [phone], at any time of the day or night, and leaving a message stating your name, name of your surgeon, type of surgery, and date of procedure or surgery. Failure to comply with your responsibilities may result in termination of therapy involving the controlled substances.  *Opioid medications include: morphine, codeine, oxycodone, oxymorphone, hydrocodone, hydromorphone, meperidine, tramadol, tapentadol, buprenorphine, fentanyl, methadone. **Benzodiazepine medications include: diazepam (Valium), alprazolam (Xanax), clonazepam (Klonopine), lorazepam (Ativan), clorazepate (Tranxene), chlordiazepoxide (Librium), estazolam (Prosom), oxazepam (Serax), temazepam (Restoril), triazolam (Halcion) (Last updated: 08/11/2020) ____________________________________________________________________________________________  ____________________________________________________________________________________________  Medication Recommendations and Reminders  Applies to: All patients receiving prescriptions (written and/or electronic).  Medication Rules & Regulations: These rules and regulations exist for your safety and that of others. They are not flexible and neither are we. Dismissing or ignoring them will be considered "non-compliance" with medication therapy, resulting in complete and irreversible termination of such therapy. (See document titled "Medication Rules" for more details.) In all conscience, because of safety reasons, we cannot continue providing a therapy where the patient does not follow instructions.  Pharmacy of record:  Definition: This is the pharmacy where your electronic  prescriptions will be sent.  We do not endorse any particular pharmacy, however, we have experienced problems with [facility] not securing enough medication supply for the community. We do not restrict you in your choice of pharmacy. However, once we write for your prescriptions, we will NOT be re-sending more prescriptions to fix restricted supply problems   created by your pharmacy, or your insurance.  The pharmacy listed in the electronic medical record should be the one where you want electronic prescriptions to be sent. If you choose to change pharmacy, simply notify our nursing staff.  Recommendations: Keep all of your pain medications in a safe place, under lock and key, even if you live alone. We will NOT replace lost, stolen, or damaged medication. After you fill your prescription, take 1 week's worth of pills and put them away in a safe place. You should keep a separate, properly labeled bottle for this purpose. The remainder should be kept in the original bottle. Use this as your primary supply, until it runs out. Once it's gone, then you know that you have 1 week's worth of medicine, and it is time to come in for a prescription refill. If you do this correctly, it is unlikely that you will ever run out of medicine. To make sure that the above recommendation works, it is very important that you make sure your medication refill appointments are scheduled at least 1 week before you run out of medicine. To do this in an effective manner, make sure that you do not leave the office without scheduling your next medication management appointment. Always ask the nursing staff to show you in your prescription , when your medication will be running out. Then arrange for the receptionist to get you a return appointment, at least 7 days before you run out of medicine. Do not wait until you have 1 or 2 pills left, to come in. This is very poor planning and does not take into consideration that we may need to  cancel appointments due to bad weather, sickness, or emergencies affecting our staff. DO NOT ACCEPT A "Partial Fill": If for any reason your pharmacy does not have enough pills/tablets to completely fill or refill your prescription, do not allow for a "partial fill". The law allows the pharmacy to complete that prescription within 72 hours, without requiring a new prescription. If they do not fill the rest of your prescription within those 72 hours, you will need a separate prescription to fill the remaining amount, which we will NOT provide. If the reason for the partial fill is your insurance, you will need to talk to the pharmacist about payment alternatives for the remaining tablets, but again, DO NOT ACCEPT A PARTIAL FILL, unless you can trust your pharmacist to obtain the remainder of the pills within 72 hours.  Prescription refills and/or changes in medication(s):  Prescription refills, and/or changes in dose or medication, will be conducted only during scheduled medication management appointments. (Applies to both, written and electronic prescriptions.) No refills on procedure days. No medication will be changed or started on procedure days. No changes, adjustments, and/or refills will be conducted on a procedure day. Doing so will interfere with the diagnostic portion of the procedure. No phone refills. No medications will be "called into the pharmacy". No Fax refills. No weekend refills. No Holliday refills. No after hours refills.  Remember:  Business hours are:  Monday to Thursday 8:00 AM to 4:00 PM Provider's Schedule: Kendrix Orman, MD - Appointments are:  Medication management: Monday and Wednesday 8:00 AM to 4:00 PM Procedure day: Tuesday and Thursday 7:30 AM to 4:00 PM Bilal Lateef, MD - Appointments are:  Medication management: Tuesday and Thursday 8:00 AM to 4:00 PM Procedure day: Monday and Wednesday 7:30 AM to 4:00 PM (Last update:  04/02/2020) ____________________________________________________________________________________________  ____________________________________________________________________________________________  CBD (cannabidiol) WARNING    Applicable to: All individuals currently taking or considering taking CBD (cannabidiol) and, more important, all patients taking opioid analgesic controlled substances (pain medication). (Example: oxycodone; oxymorphone; hydrocodone; hydromorphone; morphine; methadone; tramadol; tapentadol; fentanyl; buprenorphine; butorphanol; dextromethorphan; meperidine; codeine; etc.)  Legal status: CBD remains a Schedule I drug prohibited for any use. CBD is illegal with one exception. In the [place], CBD has a limited Food and Drug Administration (FDA) approval for the treatment of two specific types of epilepsy disorders. Only one CBD product has been approved by the FDA for this purpose: "Epidiolex". FDA is aware that some companies are marketing products containing cannabis and cannabis-derived compounds in ways that violate the Federal Food, Drug and Cosmetic Act (FD&C Act) and that may put the health and safety of consumers at risk. The FDA, a Federal agency, has not enforced the CBD status since 2018.   Legality: Some manufacturers ship CBD products nationally, which is illegal. Often such products are sold online and are therefore available throughout the country. CBD is openly sold in head shops and health food stores in some states where such sales have not been explicitly legalized. Selling unapproved products with unsubstantiated therapeutic claims is not only a violation of the law, but also can put patients at risk, as these products have not been proven to be safe or effective. Federal illegality makes it difficult to conduct research on CBD.  Reference: "FDA Regulation of Cannabis and Cannabis-Derived Products, Including Cannabidiol (CBD)" -  https://www.fda.gov/news-events/public-health-focus/fda-regulation-cannabis-and-cannabis-derived-products-including-cannabidiol-cbd  Warning: CBD is not FDA approved and has not undergo the same manufacturing controls as prescription drugs.  This means that the purity and safety of available CBD may be questionable. Most of the time, despite manufacturer's claims, it is contaminated with THC (delta-9-tetrahydrocannabinol - the chemical in marijuana responsible for the "HIGH").  When this is the case, the THC contaminant will trigger a positive urine drug screen (UDS) test for Marijuana (carboxy-THC). Because a positive UDS for any illicit substance is a violation of our medication agreement, your opioid analgesics (pain medicine) may be permanently discontinued.  MORE ABOUT CBD  General Information: CBD  is a derivative of the Marijuana (cannabis sativa) plant discovered in 1940. It is one of the 113 identified substances found in Marijuana. It accounts for up to 40% of the plant's extract. As of 2018, preliminary clinical studies on CBD included research for the treatment of anxiety, movement disorders, and pain. CBD is available and consumed in multiple forms, including inhalation of smoke or vapor, as an aerosol spray, and by mouth. It may be supplied as an oil containing CBD, capsules, dried cannabis, or as a liquid solution. CBD is thought not to be as psychoactive as THC (delta-9-tetrahydrocannabinol - the chemical in marijuana responsible for the "HIGH"). Studies suggest that CBD may interact with different biological target receptors in the body, including cannabinoid and other neurotransmitter receptors. As of 2018 the mechanism of action for its biological effects has not been determined.  Side-effects  Adverse reactions: Dry mouth, diarrhea, decreased appetite, fatigue, drowsiness, malaise, weakness, sleep disturbances, and others.  Drug interactions: CBC may interact with other medications  such as blood-thinners. (Last update: 04/19/2020) ____________________________________________________________________________________________  ____________________________________________________________________________________________  Drug Holidays (Slow)  What is a "Drug Holiday"? Drug Holiday: is the name given to the period of time during which a patient stops taking a medication(s) for the purpose of eliminating tolerance to the drug.  Benefits Improved effectiveness of opioids. Decreased opioid dose needed to achieve benefits. Improved pain with lesser dose.    What is tolerance? Tolerance: is the progressive decreased in effectiveness of a drug due to its repetitive use. With repetitive use, the body gets use to the medication and as a consequence, it loses its effectiveness. This is a common problem seen with opioid pain medications. As a result, a larger dose of the drug is needed to achieve the same effect that used to be obtained with a smaller dose.  How long should a "Drug Holiday" last? You should stay off of the pain medicine for at least 14 consecutive days. (2 weeks)  Should I stop the medicine "cold turkey"? No. You should always coordinate with your Pain Specialist so that he/she can provide you with the correct medication dose to make the transition as smoothly as possible.  How do I stop the medicine? Slowly. You will be instructed to decrease the daily amount of pills that you take by one (1) pill every seven (7) days. This is called a "slow downward taper" of your dose. For example: if you normally take four (4) pills per day, you will be asked to drop this dose to three (3) pills per day for seven (7) days, then to two (2) pills per day for seven (7) days, then to one (1) per day for seven (7) days, and at the end of those last seven (7) days, this is when the "Drug Holiday" would start.   Will I have withdrawals? By doing a "slow downward taper" like this one, it  is unlikely that you will experience any significant withdrawal symptoms. Typically, what triggers withdrawals is the sudden stop of a high dose opioid therapy. Withdrawals can usually be avoided by slowly decreasing the dose over a prolonged period of time. If you do not follow these instructions and decide to stop your medication abruptly, withdrawals may be possible.  What are withdrawals? Withdrawals: refers to the wide range of symptoms that occur after stopping or dramatically reducing opiate drugs after heavy and prolonged use. Withdrawal symptoms do not occur to patients that use low dose opioids, or those who take the medication sporadically. Contrary to benzodiazepine (example: Valium, Xanax, etc.) or alcohol withdrawals ("Delirium Tremens"), opioid withdrawals are not lethal. Withdrawals are the physical manifestation of the body getting rid of the excess receptors.  Expected Symptoms Early symptoms of withdrawal may include: Agitation Anxiety Muscle aches Increased tearing Insomnia Runny nose Sweating Yawning  Late symptoms of withdrawal may include: Abdominal cramping Diarrhea Dilated pupils Goose bumps Nausea Vomiting  Will I experience withdrawals? Due to the slow nature of the taper, it is very unlikely that you will experience any.  What is a slow taper? Taper: refers to the gradual decrease in dose.  (Last update: 04/02/2020) ____________________________________________________________________________________________    

## 2021-03-27 LAB — TOXASSURE SELECT 13 (MW), URINE

## 2021-04-21 ENCOUNTER — Encounter: Payer: Medicare Other | Admitting: Pain Medicine

## 2021-06-17 ENCOUNTER — Encounter: Payer: Self-pay | Admitting: Pain Medicine

## 2021-06-17 ENCOUNTER — Other Ambulatory Visit: Payer: Self-pay

## 2021-06-17 ENCOUNTER — Ambulatory Visit: Payer: Medicare Other | Attending: Pain Medicine | Admitting: Pain Medicine

## 2021-06-17 VITALS — BP 121/82 | HR 91 | Temp 97.1°F | Resp 16 | Ht 69.0 in | Wt 170.0 lb

## 2021-06-17 DIAGNOSIS — M25561 Pain in right knee: Secondary | ICD-10-CM | POA: Diagnosis present

## 2021-06-17 DIAGNOSIS — M47816 Spondylosis without myelopathy or radiculopathy, lumbar region: Secondary | ICD-10-CM | POA: Insufficient documentation

## 2021-06-17 DIAGNOSIS — M549 Dorsalgia, unspecified: Secondary | ICD-10-CM | POA: Insufficient documentation

## 2021-06-17 DIAGNOSIS — M25559 Pain in unspecified hip: Secondary | ICD-10-CM | POA: Insufficient documentation

## 2021-06-17 DIAGNOSIS — M79604 Pain in right leg: Secondary | ICD-10-CM | POA: Diagnosis present

## 2021-06-17 DIAGNOSIS — M7918 Myalgia, other site: Secondary | ICD-10-CM | POA: Diagnosis present

## 2021-06-17 DIAGNOSIS — M25511 Pain in right shoulder: Secondary | ICD-10-CM | POA: Diagnosis present

## 2021-06-17 DIAGNOSIS — M79602 Pain in left arm: Secondary | ICD-10-CM | POA: Diagnosis present

## 2021-06-17 DIAGNOSIS — Z79899 Other long term (current) drug therapy: Secondary | ICD-10-CM | POA: Diagnosis present

## 2021-06-17 DIAGNOSIS — M47812 Spondylosis without myelopathy or radiculopathy, cervical region: Secondary | ICD-10-CM | POA: Diagnosis present

## 2021-06-17 DIAGNOSIS — Z79891 Long term (current) use of opiate analgesic: Secondary | ICD-10-CM | POA: Insufficient documentation

## 2021-06-17 DIAGNOSIS — M533 Sacrococcygeal disorders, not elsewhere classified: Secondary | ICD-10-CM | POA: Insufficient documentation

## 2021-06-17 DIAGNOSIS — M542 Cervicalgia: Secondary | ICD-10-CM | POA: Diagnosis present

## 2021-06-17 DIAGNOSIS — G608 Other hereditary and idiopathic neuropathies: Secondary | ICD-10-CM | POA: Diagnosis present

## 2021-06-17 DIAGNOSIS — G8929 Other chronic pain: Secondary | ICD-10-CM | POA: Insufficient documentation

## 2021-06-17 DIAGNOSIS — G894 Chronic pain syndrome: Secondary | ICD-10-CM | POA: Insufficient documentation

## 2021-06-17 DIAGNOSIS — M79601 Pain in right arm: Secondary | ICD-10-CM | POA: Diagnosis present

## 2021-06-17 DIAGNOSIS — M5441 Lumbago with sciatica, right side: Secondary | ICD-10-CM | POA: Diagnosis present

## 2021-06-17 DIAGNOSIS — F1120 Opioid dependence, uncomplicated: Secondary | ICD-10-CM | POA: Diagnosis present

## 2021-06-17 DIAGNOSIS — M5442 Lumbago with sciatica, left side: Secondary | ICD-10-CM | POA: Insufficient documentation

## 2021-06-17 DIAGNOSIS — M25512 Pain in left shoulder: Secondary | ICD-10-CM | POA: Diagnosis present

## 2021-06-17 DIAGNOSIS — G4486 Cervicogenic headache: Secondary | ICD-10-CM | POA: Diagnosis present

## 2021-06-17 DIAGNOSIS — M79605 Pain in left leg: Secondary | ICD-10-CM | POA: Insufficient documentation

## 2021-06-17 MED ORDER — OXYCODONE HCL 5 MG PO TABS: 5.0000 mg | ORAL_TABLET | Freq: Three times a day (TID) | ORAL | 0 refills | Status: DC | PRN

## 2021-06-17 NOTE — Progress Notes (Addendum)
PROVIDER NOTE: Information contained herein reflects review and annotations entered in association with encounter. Interpretation of such information and data should be left to medically-trained personnel. Information provided to patient can be located elsewhere in the medical record under "Patient Instructions". Document created using STT-dictation technology, any transcriptional errors that may result from process are unintentional.    Patient: Ricky Mercer  Service Category: E/M  Provider: Gaspar Cola, MD  DOB: 07-03-1951  DOS: 06/17/2021  Specialty: Interventional Pain Management  MRN: [id number]  Setting: Ambulatory outpatient  PCP: Sofie Hartigan, MD  Type: Established Patient    Referring Provider: Sofie Hartigan, MD  Location: Office  Delivery: Face-to-face     HPI  [name], a 70 y.o. year old male, is here today because of his Chronic bilateral low back pain with bilateral sciatica [M54.42, M54.41, G89.29]. [name] primary complain today is Neck Pain Last encounter: My last encounter with him was on 03/24/2021. Pertinent problems: [name] has Hand weakness; Polyradiculopathy; Chronic low back pain (1ry area of Pain) (Bilateral) (L>R); Chronic lower extremity pain (3ry area of Pain) (Bilateral) (L>R); Chronic lumbar radicular pain (Bilateral) (L>R) (Bilateral S1 dermatomal distribution); Chronic hip pain (2ry area of Pain) (Bilateral) (L>R); Chronic upper back pain (Bilateral) (L>R); Chronic neck pain (neck<shoulder) (L>R); Chronic foot pain; Chronic knee pain (Right); Cervical spondylosis; 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); Lumbar spondylosis; Cervical facet hypertrophy; Cervical facet syndrome (R>L); History of prostate cancer; Radicular pain of shoulder; Peripheral neuropathy (HCC) (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); Lumbar facet syndrome (Bilateral) (L>R); Chronic shoulder pain (Bilateral) (L>R); Chronic sacroiliac  joint pain (Bilateral) (L>R); Chronic musculoskeletal pain; Pain of right foot; Neurogenic pain; Chronic pain syndrome; Bilateral arm pain; DDD (degenerative disc disease), cervical; DDD (degenerative disc disease), lumbosacral; Cervicogenic headache; Cervico-occipital neuralgia; Osteoarthritis of spine with radiculopathy, lumbar region; Decreased sensation; Numbness and tingling; Weakness of left leg; Bilateral leg and foot pain; Cervicalgia; Chronic upper extremity pain (Bilateral) (L>R); Abnormal MRI, cervical spine (12/07/2019); Burning sensation of feet; Numbness and tingling of both legs; Spondylosis without myelopathy or radiculopathy, cervical region; Osteoarthritis involving multiple joints; Chronic mid back pain; and Total body pain on their pertinent problem list. Pain Assessment: Severity of Chronic pain is reported as a 6 /10. Location: Neck Left, Right/pain radiaties down both arm down to his fingers. Onset: More than a month ago. Quality: Aching, Burning, Tingling, Throbbing, Sharp. Timing: Constant. Modifying factor(s): meds and hot bath. Vitals:  height is _0  (1.753 m) and weight is 170 lb (77.1 kg). His temporal temperature is 97.1 F (36.2 C) (abnormal). His blood pressure is 121/82 and his pulse is 91. His respiration is 16 and oxygen saturation is 100%.   Reason for encounter: medication management.  Today patient indicates that he is not having any side effects or adverse reactions to his medications, but they just do not seem to help with the neuropathic pain that he is having in his feet.  This is understandable since opioids have not been good for neuropathic pain.  However, today I have reviewed his medical record to see if there is anything else that I can do for him and to make sure that he is still taking the medications that I have recommended for him.  He has admitted to continuing to take the vitamin D, he currently is taking Neurontin 300 mg tablet, 1 tablet p.o. twice daily  and 2 at bedtime, for a total of 1200 mg/day.  He is not having any side effects or adverse reactions to that though.  This is 1 particular area where he could improve.  For her neuropathic pain, Neurontin can be used to a dose as high as 3600 mg/day divided in 4 doses.  He is no longer taking meloxicam, but this is because he has been taking prednisone on a daily basis.  Reviewing his medical record shows that on 12/02/2014 he had a nerve conduction test done by Dr. Manuella Ghazi of the upper extremities which was found to be within normal limits.  This was followed by another nerve conduction test of the lower extremities, this time done by Dr. Melrose Nakayama.  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  On 07/26/2018 the patient had another nerve conduction test done by Dr. Melrose Nakayama which was then read as negative.  On 05/27/2020 he had an EMG/PNCV done by Dr. Melrose Nakayama of the hands, showing no evidence of large fiber neuropathy.  On 06/13/2020 the patient had a skin biopsy for an epidermal nerve fiber density study.  This was done by Dr. Manuella Ghazi and apparently sent to neuropathology for evaluation.  Unfortunately the results of that study is not available to me.  Today I have sent a message to Dr. Manuella Ghazi to see if he can help me get those results.  Today the patient has requested an increase in his oxycodone IR 5 mg p.o. 3 times daily, but in the absence of any new evidence of changes and worsening of his condition, I cannot help but think that were dealing with opioid tolerance.  Because of this I have recommended that he do a "Drug Holiday" instead.  Because opioids are notoriously ineffective in the treatment of neuropathic pain, he would not be indicated to increase this medication further.  Should biopsy done by Dr. Manuella Ghazi shows that the patient has small fiber disease, I have talked to the patient about the option of doing Qutenza treatments for the  treatment of that referral neuropathy.  He describes his feet to be the worst.  RTCB: 09/20/2021 Nonopioids transferred 06/25/2020: Vitamin D3, Neurontin, and Mobic.  Pharmacotherapy Assessment  Analgesic: Oxycodone 5 mg, 1 tab PO q 8 hrs (15 mg/day of oxycodone) MME/day: 22.5 mg/day.   Monitoring:  PMP: PDMP reviewed during this encounter.       Pharmacotherapy: No side-effects or adverse reactions reported. Compliance: No problems identified. Effectiveness: Clinically acceptable.  Chauncey Fischer, RN  06/17/2021 12:06 PM  Sign when Signing Visit Nursing Pain Medication Assessment:  Safety precautions to be maintained throughout the outpatient stay will include: orient to surroundings, keep bed in low position, maintain call bell within reach at all times, provide assistance with transfer out of bed and ambulation.  Medication Inspection Compliance: Pill count conducted under aseptic conditions, in front of the patient. Neither the pills nor the bottle was removed from the patient's sight at any time. Once count was completed pills were immediately returned to the patient in their original bottle.  Medication: Oxycodone IR Pill/Patch Count:  33 of 90 pills remain Pill/Patch Appearance: Markings consistent with prescribed medication Bottle Appearance: Standard pharmacy container. Clearly labeled. Filled Date: 46 / 19 / 2022 Last Medication intake:  Today Safety precautions to be maintained throughout the outpatient stay will include: orient to surroundings, keep bed in low position, maintain call bell within reach at all times, provide assistance with transfer out of bed and ambulation.  UDS:  Summary  Date Value Ref Range Status  03/24/2021 Note  Final    Comment:    ==================================================================== [employer] (MW) ==================================================================== Test                             Result       Flag        Units  Drug Present and Declared for Prescription Verification   Oxycodone                      269          EXPECTED   ng/mg creat   Oxymorphone                    2180         EXPECTED   ng/mg creat   Noroxycodone                   853          EXPECTED   ng/mg creat   Noroxymorphone                 430          EXPECTED   ng/mg creat    Sources of oxycodone are scheduled prescription medications.    Oxymorphone, noroxycodone, and noroxymorphone are expected    metabolites of oxycodone. Oxymorphone is also available as a    scheduled prescription medication.  ==================================================================== Test                      Result    Flag   Units      Ref Range   Creatinine              172              mg/dL      >=20 ==================================================================== Declared Medications:  The flagging and interpretation on this report are based on the  following declared medications.  Unexpected results may arise from  inaccuracies in the declared medications.   **Note: The testing scope of this panel includes these medications:   Oxycodone   **Note: The testing scope of this panel does not include the  following reported medications:   Aspirin  Bupropion (Wellbutrin)  Cholecalciferol  Gabapentin (Neurontin)  Meloxicam (Mobic)  Nortriptyline (Pamelor)  Prednisone (Deltasone)  Simvastatin (Zocor) ==================================================================== For clinical consultation, please call [phone]. ====================================================================      ROS  Constitutional: Denies any fever or chills Gastrointestinal: No reported hemesis, hematochezia, vomiting, or acute GI distress Musculoskeletal: Denies any acute onset joint swelling, redness, loss of ROM, or weakness Neurological: No reported episodes of acute onset apraxia, aphasia, dysarthria, agnosia, amnesia, paralysis,  loss of coordination, or loss of consciousness  Medication Review  Cholecalciferol, aspirin, gabapentin, meloxicam, nortriptyline, oxyCODONE, predniSONE, and simvastatin  History Review  Allergy: [name] has No Known Allergies. Drug: [name]  reports no history of drug use. Alcohol:  reports no history of alcohol use. Tobacco:  reports that he has never smoked. He has never used smokeless tobacco. Social: [name]  reports that he has never smoked. He has never used smokeless tobacco. He reports that he does not drink alcohol and does not use drugs. Medical:  has a past medical history of Acute postoperative pain (03/21/2017), Anginal pain ([place]), Bilateral arm pain, Cardiomyopathy ([place]), Cardiomyopathy, idiopathic ([place]), Chronic back  pain, Chronic pain syndrome, Depression, Diverticulosis, Dupuytren's contracture, Erosive gastritis, Fibromyalgia, Foot pain, bilateral, GERD (gastroesophageal reflux disease), Hand weakness, Hyperlipidemia, Knee pain, Midline low back pain, Neuromuscular disorder (HCC), Opioid dependence (HCC), Osteoarthritis, Pain in neck, Palpitations, Prostate cancer ([place]), Reactive airway disease, Seasonal allergies, Situational anxiety, Skin cancer, Sleep apnea, and Tubular adenoma of colon. Surgical: Mr. Hidrogo  has a past surgical history that includes Nose surgery; Hand surgery (Bilateral); Colonoscopy (10/24/2012); Upper gi endoscopy (12/18/2012); Tonsillectomy (2009); Skin cancer excision (Left, 2015); Mass excision (Right, 07/18/2015); Esophagogastroduodenoscopy (egd) with propofol (N/A, 10/28/2015); prostate seeding; Hernia repair; Cholecystectomy (N/A, 12/02/2015); Esophagogastroduodenoscopy (N/A, 11/17/2017); Colonoscopy with propofol (N/A, 11/17/2017); Esophagogastroduodenoscopy (N/A, 03/24/2020); and Colonoscopy with propofol (N/A, 11/03/2020). Family: family history includes Colon cancer in his brother; Hyperlipidemia in his sister; Lung cancer in his father; Prostate  cancer in his brother.  Laboratory Chemistry Profile   Renal Lab Results  Component Value Date   BUN 11 08/04/2020   CREATININE 0.99 08/04/2020   BCR 12 08/06/2019   GFRAA 99 08/06/2019   GFRNONAA >60 08/04/2020    Hepatic Lab Results  Component Value Date   AST 20 08/04/2020   ALT 15 08/04/2020   ALBUMIN 3.8 08/04/2020   ALKPHOS 47 08/04/2020   LIPASE 24 08/04/2020    Electrolytes Lab Results  Component Value Date   NA 139 08/04/2020   K 4.3 08/04/2020   CL 102 08/04/2020   CALCIUM 8.8 (L) 08/04/2020   MG 2.0 08/06/2019    Bone Lab Results  Component Value Date   25OHVITD1 49 08/06/2019   25OHVITD2 <1.0 08/06/2019   25OHVITD3 49 08/06/2019    Inflammation (CRP: Acute Phase) (ESR: Chronic Phase) Lab Results  Component Value Date   CRP <1 08/06/2019   ESRSEDRATE 2 08/06/2019         Note: Above Lab results reviewed.  Recent Imaging Review  CT CHEST WO CONTRAST CLINICAL DATA:  Right lower chest pain for 5 months, increasing, no known injury  EXAM: CT CHEST WITHOUT CONTRAST  TECHNIQUE: Multidetector CT imaging of the chest was performed following the standard protocol without IV contrast.  COMPARISON:  11/15/2013  FINDINGS: Cardiovascular: No significant vascular findings. Normal heart size. No pericardial effusion.  Mediastinum/Nodes: No enlarged mediastinal, hilar, or axillary lymph nodes. Thyroid gland, trachea, and esophagus demonstrate no significant findings.  Lungs/Pleura: There is a small, definitively benign calcified nodule of the right upper lobe (series 3, image 40). There are occasional tiny noncalcified nodules, for example a 3 mm nodule of the lateral segment right middle lobe, stable compared to prior examination dated 2015 and definitively benign (series 3, image 96). No pleural effusion or pneumothorax.  Upper Abdomen: No acute abnormality.  Musculoskeletal: No chest wall mass or suspicious bone  lesions identified.  IMPRESSION: 1. No non-contrast CT findings of the chest to explain right-sided chest pain.  2. Occasional tiny, stable, and definitively benign small pulmonary nodules for which no further follow-up is required.  Electronically Signed   By: Eddie Candle M.D.   On: 09/17/2020 13:26 Note: Reviewed        Physical Exam  General appearance: Well nourished, well developed, and well hydrated. In no apparent acute distress Mental status: Alert, oriented x 3 (person, place, & time)       Respiratory: No evidence of acute respiratory distress Eyes: PERLA Vitals: BP 121/82 (BP Location: Right Arm, Patient Position: Sitting, Cuff Size: Large)   Pulse 91   Temp (!) 97.1 F (36.2 C) (Temporal)  Resp 16   Ht _0  (1.753 m)   Wt 170 lb (77.1 kg)   SpO2 100%   BMI 25.10 kg/m  BMI: Estimated body mass index is 25.1 kg/m as calculated from the following:   Height as of this encounter: _1  (1.753 m).   Weight as of this encounter: 170 lb (77.1 kg). Ideal: Ideal body weight: 70.7 kg (155 lb 13.8 oz) Adjusted ideal body weight: 73.3 kg (161 lb 8.3 oz)  Assessment   Status Diagnosis  Controlled Controlled Controlled 1. Chronic low back pain (1ry area of Pain) (Bilateral) (L>R)   2. Lumbar facet syndrome (Bilateral) (L>R)   3. Chronic sacroiliac joint pain (Bilateral) (L>R)   4. Chronic mid back pain   5. Chronic upper back pain (Bilateral) (L>R)   6. Chronic hip pain (2ry area of Pain) (Bilateral) (L>R)   7. Chronic lower extremity pain (3ry area of Pain) (Bilateral) (L>R)   8. Chronic knee pain (Right)   9. Cervicalgia   10. Cervicogenic headache   11. Chronic shoulder pain (Bilateral) (L>R)   12. Chronic upper extremity pain (Bilateral) (L>R)   13. Chronic musculoskeletal pain   14. Chronic pain syndrome   15. Pharmacologic therapy   16. Chronic use of opiate for therapeutic purpose   17. Encounter for medication management   18. Chronic low back pain  (Primary Area of Pain) (Bilateral) (L>R)   19. Chronic lower extremity pain (Third area of Pain) (Bilateral) (L>R)   20. Chronic hip pain (Secondary area of Pain) (Bilateral) (L>R)   21. Cervical facet syndrome (R>L)   22. Uncomplicated opioid dependence ([place])      Updated Problems: Problem  Chronic Mid Back Pain  Chronic low back pain (1ry area of Pain) (Bilateral) (L>R)  Chronic lower extremity pain (3ry area of Pain) (Bilateral) (L>R)  Chronic hip pain (2ry area of Pain) (Bilateral) (L>R)    Plan of Care  Problem-specific:  No problem-specific Assessment & Plan notes found for this encounter.  [name] has a current medication list which includes the following long-term medication(s): gabapentin, gabapentin, nortriptyline, simvastatin, cholecalciferol, gabapentin, meloxicam, [START ON 06/22/2021] oxycodone, [START ON 07/22/2021] oxycodone, and [START ON 08/21/2021] oxycodone.  Pharmacotherapy (Medications Ordered): Meds ordered this encounter  Medications   oxyCODONE (OXY IR/ROXICODONE) 5 MG immediate release tablet    Sig: Take 1 tablet (5 mg total) by mouth every 8 (eight) hours as needed for severe pain. Must last 30 days    Dispense:  90 tablet    Refill:  0    Not a duplicate. Do NOT delete! Dispense 1 day early if closed on fill date. Warn: no CNS-depressants within 8 hrs of opioid. Do not send refill request. Renewal requires appointment. No partial fills allowed   oxyCODONE (OXY IR/ROXICODONE) 5 MG immediate release tablet    Sig: Take 1 tablet (5 mg total) by mouth every 8 (eight) hours as needed for severe pain. Must last 30 days    Dispense:  90 tablet    Refill:  0    Not a duplicate. Do NOT delete! Dispense 1 day early if closed on fill date. Warn: no CNS-depressants within 8 hrs of opioid. Do not send refill request. Renewal requires appointment. No partial fills allowed   oxyCODONE (OXY IR/ROXICODONE) 5 MG immediate release tablet    Sig: Take 1 tablet (5  mg total) by mouth every 8 (eight) hours as needed for severe pain. Must last 30 days  Dispense:  90 tablet    Refill:  0    Not a duplicate. Do NOT delete! Dispense 1 day early if closed on fill date. Warn: no CNS-depressants within 8 hrs of opioid. Do not send refill request. Renewal requires appointment. No partial fills allowed    Orders:  No orders of the defined types were placed in this encounter.  Follow-up plan:   Return in about 3 months (around 09/20/2021) for Eval-day (M,W), (F2F), (MM).     Interventional Therapies  Risk  Complexity Considerations:   Estimated body mass index is 25.1 kg/m as calculated from the following:   Height as of this encounter: _0  (1.753 m).   Weight as of this encounter: 170 lb (77.1 kg). WNL   Planned  Pending:   Pending further evaluation   Under consideration:   Possible Qutenza treatments of the lower extremities depending on the results of the epidermal nerve fiber density biopsy conducted by Dr. Brigitte Pulse on 06/13/2020. Therapeutic right cervical facet RFA #1  Therapeutic right lumbar facet RFA #1  Therapeutic right SI joint RFA #1  Diagnostic bilateral Hip joint injection  Diagnostic Caudal ESI + diagnostic epidurogram  Diagnostic right Knee injection  Diagnostic right Genicular NB  Diagnostic bilateral IA shoulder injection  Diagnostic bilateral suprascapular NB    Completed:   Palliative bilateral cervical facet block x1  Palliative right lumbar facet block x3  Palliative left lumbar facet block x3  Palliative right SI joint block x2  Palliative left SI joint block x2  Palliative left lumbar facet RFA (last done on 03/21/2017)  Palliative left SI joint RFA (last done on 03/21/2017)  Therapeutic/palliative left CESI x3  Palliative left cervical facet RFA x1 (last done 03/11/2020)   Therapeutic  Palliative (PRN) options:   Palliative bilateral cervical facet block #2  Palliative right lumbar facet block #4  Palliative left  lumbar facet block #4  Palliative right SI joint block #3  Palliative left SI joint block #3  Palliative left lumbar facet RFA (last done on 03/21/2017)  Palliative left SI joint RFA (last done on 03/21/2017)  Therapeutic/palliative left CESI #4  Palliative left cervical facet RFA #2 (last done 03/11/2020)    Recent Visits Date Type Provider Dept  03/24/21 Office Visit Milinda Pointer, MD [facility]  Showing recent visits within past 90 days and meeting all other requirements Today's Visits Date Type Provider Dept  06/17/21 Office Visit Milinda Pointer, MD [facility]  Showing today's visits and meeting all other requirements Future Appointments Date Type Provider Dept  09/09/21 Appointment Milinda Pointer, MD [facility]  Showing future appointments within next 90 days and meeting all other requirements I discussed the assessment and treatment plan with the patient. The patient was provided an opportunity to ask questions and all were answered. The patient agreed with the plan and demonstrated an understanding of the instructions.  Patient advised to call back or seek an in-person evaluation if the symptoms or condition worsens.  Duration of encounter: 45 minutes.  Note by: Gaspar Cola, MD Date: 06/17/2021; Time: 1:11 PM

## 2021-06-17 NOTE — Patient Instructions (Signed)

## 2021-06-17 NOTE — Progress Notes (Signed)
Nursing Pain Medication Assessment:  Safety precautions to be maintained throughout the outpatient stay will include: orient to surroundings, keep bed in low position, maintain call bell within reach at all times, provide assistance with transfer out of bed and ambulation.  Medication Inspection Compliance: Pill count conducted under aseptic conditions, in front of the patient. Neither the pills nor the bottle was removed from the patient's sight at any time. Once count was completed pills were immediately returned to the patient in their original bottle.  Medication: Oxycodone IR Pill/Patch Count:  33 of 90 pills remain Pill/Patch Appearance: Markings consistent with prescribed medication Bottle Appearance: Standard pharmacy container. Clearly labeled. Filled Date: 61 / 19 / 2022 Last Medication intake:  Today Safety precautions to be maintained throughout the outpatient stay will include: orient to surroundings, keep bed in low position, maintain call bell within reach at all times, provide assistance with transfer out of bed and ambulation.

## 2021-09-08 NOTE — Progress Notes (Signed)
PROVIDER NOTE: Information contained herein reflects review and annotations entered in association with encounter. Interpretation of such information and data should be left to medically-trained personnel. Information provided to patient can be located elsewhere in the medical record under "Patient Instructions". Document created using STT-dictation technology, any transcriptional errors that may result from process are unintentional.    Patient: Ricky Mercer  Service Category: E/M  Provider: Gaspar Cola, MD  DOB: 06-24-1951  DOS: 09/09/2021  Specialty: Interventional Pain Management  MRN: [id number]  Setting: Ambulatory outpatient  PCP: Sofie Hartigan, MD  Type: Established Patient    Referring Provider: Sofie Hartigan, MD  Location: Office  Delivery: Face-to-face     HPI  [name], a 70 y.o. year old male, is here today because of his Chronic bilateral low back pain with bilateral sciatica [M54.42, M54.41, G89.29]. Mr. Harig primary complain today is Back Pain Last encounter: My last encounter with him was on 06/17/2021. Pertinent problems: Ricky Mercer has Hand weakness; Polyradiculopathy; Chronic low back pain (1ry area of Pain) (Bilateral) (L>R); Chronic lower extremity pain (3ry area of Pain) (Bilateral) (L>R); Chronic lumbar radicular pain (Bilateral) (L>R) (Bilateral S1 dermatomal distribution); Chronic hip pain (2ry area of Pain) (Bilateral) (L>R); Chronic upper back pain (Bilateral) (L>R); Chronic neck pain (neck<shoulder) (L>R); Chronic knee pain (Right); Cervical spondylosis; 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); Lumbar spondylosis; Cervical facet hypertrophy; Cervical facet syndrome (R>L); Radicular pain of shoulder; Peripheral neuropathy (HCC) (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); Lumbar facet syndrome (Bilateral) (L>R); Chronic shoulder pain (Bilateral) (L>R); Chronic sacroiliac joint pain (Bilateral) (L>R); Chronic  musculoskeletal pain; Chronic foot pain (Bilateral); Neurogenic pain; Chronic pain syndrome; Bilateral arm pain; DDD (degenerative disc disease), cervical; DDD (degenerative disc disease), lumbosacral; Cervicogenic headache; Cervico-occipital neuralgia; Osteoarthritis of spine with radiculopathy, lumbar region; Decreased sensation; Numbness and tingling; Weakness of left leg; Bilateral leg and foot pain; Cervicalgia; Chronic upper extremity pain (Bilateral) (L>R); Abnormal MRI, cervical spine (12/07/2019); Chronic burning sensation of feet (Bilateral); Numbness and tingling of both legs; Spondylosis without myelopathy or radiculopathy, cervical region; Osteoarthritis involving multiple joints; Chronic mid back pain; Total body pain; Idiopathic small fiber sensory neuropathy; Fibromyalgia; and Small fiber neuropathy on their pertinent problem list. Pain Assessment: Severity of Chronic pain is reported as a 6 /10. Location: Back Right, Left/down to lower back  hip/buttocks down back of legs to bottom of feet effects all toes. Onset: More than a month ago. Quality: Aching. Timing: Intermittent. Modifying factor(s): heat, rest. Vitals:  height is 5' 9" (1.753 m) and weight is 170 lb (77.1 kg). His temperature is 97.3 F (36.3 C) (abnormal). His blood pressure is 130/88 and his pulse is 81. His respiration is 16 and oxygen saturation is 100%.   Reason for encounter: medication management.   The patient indicates doing well with the current medication regimen. No adverse reactions or side effects reported to the medications.   RTCB: 12/19/2021 Nonopioids transferred 06/25/2020: Vitamin D3, Neurontin, and Mobic.  Pharmacotherapy Assessment  Analgesic: Oxycodone 5 mg, 1 tab PO q 8 hrs (15 mg/day of oxycodone) MME/day: 22.5 mg/day.   Monitoring: [place] PMP: PDMP reviewed during this encounter.       Pharmacotherapy: No side-effects or adverse reactions reported. Compliance: No problems identified. Effectiveness:  Clinically acceptable.  Ricky Specking, RN  09/09/2021  9:44 AM  Sign when Signing Visit Nursing Pain Medication Assessment:  Safety precautions to be maintained throughout the outpatient stay will include: orient to  surroundings, keep bed in low position, maintain call bell within reach at all times, provide assistance with transfer out of bed and ambulation.  Medication Inspection Compliance: Pill count conducted under aseptic conditions, in front of the patient. Neither the pills nor the bottle was removed from the patient's sight at any time. Once count was completed pills were immediately returned to the patient in their original bottle.  Medication: Oxycodone IR Pill/Patch Count:  65 of 90 pills remain Pill/Patch Appearance: Markings consistent with prescribed medication Bottle Appearance: Standard pharmacy container. Clearly labeled. Filled Date: 12/ 19 / 2022 Last Medication intake:  Today    UDS:  Summary  Date Value Ref Range Status  03/24/2021 Note  Final    Comment:    ==================================================================== [employer] Select 13 (MW) ==================================================================== Test                             Result       Flag       Units  Drug Present and Declared for Prescription Verification   Oxycodone                      269          EXPECTED   ng/mg creat   Oxymorphone                    2180         EXPECTED   ng/mg creat   Noroxycodone                   853          EXPECTED   ng/mg creat   Noroxymorphone                 430          EXPECTED   ng/mg creat    Sources of oxycodone are scheduled prescription medications.    Oxymorphone, noroxycodone, and noroxymorphone are expected    metabolites of oxycodone. Oxymorphone is also available as a    scheduled prescription medication.  ==================================================================== Test                      Result    Flag   Units      Ref  Range   Creatinine              172              mg/dL      >=20 ==================================================================== Declared Medications:  The flagging and interpretation on this report are based on the  following declared medications.  Unexpected results may arise from  inaccuracies in the declared medications.   **Note: The testing scope of this panel includes these medications:   Oxycodone   **Note: The testing scope of this panel does not include the  following reported medications:   Aspirin  Bupropion (Wellbutrin)  Cholecalciferol  Gabapentin (Neurontin)  Meloxicam (Mobic)  Nortriptyline (Pamelor)  Prednisone (Deltasone)  Simvastatin (Zocor) ==================================================================== For clinical consultation, please call [phone]. ====================================================================      ROS  Constitutional: Denies any fever or chills Gastrointestinal: No reported hemesis, hematochezia, vomiting, or acute GI distress Musculoskeletal: Denies any acute onset joint swelling, redness, loss of ROM, or weakness Neurological: No reported episodes of acute onset apraxia, aphasia, dysarthria, agnosia, amnesia, paralysis, loss of coordination, or loss of consciousness  Medication Review  aspirin, busPIRone, calcium-vitamin D, gabapentin, nortriptyline, oxyCODONE, predniSONE, and simvastatin  History Review  Allergy: Mr. Bomar has No Known Allergies. Drug: Mr. Standage  reports no history of drug use. Alcohol:  reports no history of alcohol use. Tobacco:  reports that he has never smoked. He has never used smokeless tobacco. Social: Mr. Schueler  reports that he has never smoked. He has never used smokeless tobacco. He reports that he does not drink alcohol and does not use drugs. Medical:  has a past medical history of Acute postoperative pain (03/21/2017), Anginal pain ([place]), Bilateral arm pain,  Cardiomyopathy ([place]), Cardiomyopathy, idiopathic ([place]), Chronic back pain, Chronic pain syndrome, Depression, Diverticulosis, Dupuytren's contracture, Erosive gastritis, Fibromyalgia, Foot pain, bilateral, GERD (gastroesophageal reflux disease), Hand weakness, Hyperlipidemia, Knee pain, Midline low back pain, Neuromuscular disorder (HCC), Opioid dependence ([place]), Osteoarthritis, Pain in neck, Palpitations, Prostate cancer ([place]), Reactive airway disease, Seasonal allergies, Situational anxiety, Skin cancer, Sleep apnea, and Tubular adenoma of colon. Surgical: Mr. Yusuf  has a past surgical history that includes Nose surgery; Hand surgery (Bilateral); Colonoscopy (10/24/2012); Upper gi endoscopy (12/18/2012); Tonsillectomy (2009); Skin cancer excision (Left, 2015); Mass excision (Right, 07/18/2015); Esophagogastroduodenoscopy (egd) with propofol (N/A, 10/28/2015); prostate seeding; Hernia repair; Cholecystectomy (N/A, 12/02/2015); Esophagogastroduodenoscopy (N/A, 11/17/2017); Colonoscopy with propofol (N/A, 11/17/2017); Esophagogastroduodenoscopy (N/A, 03/24/2020); and Colonoscopy with propofol (N/A, 11/03/2020). Family: family history includes Colon cancer in his brother; Hyperlipidemia in his sister; Lung cancer in his father; Prostate cancer in his brother.  Laboratory Chemistry Profile   Renal Lab Results  Component Value Date   BUN 11 08/04/2020   CREATININE 0.99 08/04/2020   BCR 12 08/06/2019   GFRAA 99 08/06/2019   GFRNONAA >60 08/04/2020    Hepatic Lab Results  Component Value Date   AST 20 08/04/2020   ALT 15 08/04/2020   ALBUMIN 3.8 08/04/2020   ALKPHOS 47 08/04/2020   LIPASE 24 08/04/2020    Electrolytes Lab Results  Component Value Date   NA 139 08/04/2020   K 4.3 08/04/2020   CL 102 08/04/2020   CALCIUM 8.8 (L) 08/04/2020   MG 2.0 08/06/2019    Bone Lab Results  Component Value Date   25OHVITD1 49 08/06/2019   25OHVITD2 <1.0 08/06/2019   25OHVITD3 49 08/06/2019     Inflammation (CRP: Acute Phase) (ESR: Chronic Phase) Lab Results  Component Value Date   CRP <1 08/06/2019   ESRSEDRATE 2 08/06/2019         Note: Above Lab results reviewed.  Recent Imaging Review  CT CHEST WO CONTRAST CLINICAL DATA:  Right lower chest pain for 5 months, increasing, no known injury  EXAM: CT CHEST WITHOUT CONTRAST  TECHNIQUE: Multidetector CT imaging of the chest was performed following the standard protocol without IV contrast.  COMPARISON:  11/15/2013  FINDINGS: Cardiovascular: No significant vascular findings. Normal heart size. No pericardial effusion.  Mediastinum/Nodes: No enlarged mediastinal, hilar, or axillary lymph nodes. Thyroid gland, trachea, and esophagus demonstrate no significant findings.  Lungs/Pleura: There is a small, definitively benign calcified nodule of the right upper lobe (series 3, image 40). There are occasional tiny noncalcified nodules, for example a 3 mm nodule of the lateral segment right middle lobe, stable compared to prior examination dated 2015 and definitively benign (series 3, image 96). No pleural effusion or pneumothorax.  Upper Abdomen: No acute abnormality.  Musculoskeletal: No chest wall mass or suspicious bone lesions identified.  IMPRESSION: 1. No non-contrast CT findings of the chest to explain right-sided chest pain.  2. Occasional tiny,  stable, and definitively benign small pulmonary nodules for which no further follow-up is required.  Electronically Signed   By: Eddie Candle M.D.   On: 09/17/2020 13:26 Note: Reviewed        Physical Exam  General appearance: Well nourished, well developed, and well hydrated. In no apparent acute distress Mental status: Alert, oriented x 3 (person, place, & time)       Respiratory: No evidence of acute respiratory distress Eyes: PERLA Vitals: BP 130/88    Pulse 81    Temp (!) 97.3 F (36.3 C)    Resp 16    Ht 5' 9" (1.753 m)    Wt 170 lb (77.1 kg)    SpO2  100%    BMI 25.10 kg/m  BMI: Estimated body mass index is 25.1 kg/m as calculated from the following:   Height as of this encounter: 5' 9" (1.753 m).   Weight as of this encounter: 170 lb (77.1 kg). Ideal: Ideal body weight: 70.7 kg (155 lb 13.8 oz) Adjusted ideal body weight: 73.3 kg (161 lb 8.3 oz)  Assessment   Status Diagnosis  Controlled Controlled Controlled 1. Chronic low back pain (1ry area of Pain) (Bilateral) (L>R)   2. Chronic hip pain (2ry area of Pain) (Bilateral) (L>R)   3. Chronic lower extremity pain (3ry area of Pain) (Bilateral) (L>R)   4. Cervicalgia   5. DDD (degenerative disc disease), lumbosacral   6. DDD (degenerative disc disease), cervical   7. Chronic pain syndrome   8. Pharmacologic therapy   9. Chronic use of opiate for therapeutic purpose   10. Encounter for medication management   11. Encounter for chronic pain management      Updated Problems: Problem  Idiopathic Small Fiber Sensory Neuropathy  Small Fiber Neuropathy   Formatting of this note might be different from the original. Biopsy proven   Fibromyalgia  Chronic Mid Back Pain  Total Body Pain  Osteoarthritis involving multiple joints  Spondylosis Without Myelopathy Or Radiculopathy, Cervical Region  Abnormal MRI, cervical spine (12/07/2019)   FINDINGS: Alignment: Slight anterolisthesis C3-4 unchanged. Remaining alignment normal. Vertebrae: Negative for fracture or mass Cord: Normal signal and morphology Posterior Fossa, vertebral arteries, paraspinal tissues: Negative   Disc levels: C2-3: Mild left foraminal narrowing.  Mild left facet degeneration. C3-4: Mild disc degeneration and uncinate spurring. Left-sided facet degeneration. Mild left foraminal narrowing. C4-5: Bilateral facet degeneration. No significant spinal or foraminal stenosis C5-6: Mild facet degeneration bilaterally.  Negative for stenosis C6-7: Negative C7-T1: Negative   IMPRESSION: Mild cervical spine  degenerative changes with mild left foraminal narrowing C2-3 and C3-4. Overall no change from 2016.   Cervicalgia  Chronic upper extremity pain (Bilateral) (L>R)  Decreased Sensation  Numbness and Tingling  Weakness of Left Leg  Chronic burning sensation of feet (Bilateral)  Numbness and Tingling of Both Legs  Ddd (Degenerative Disc Disease), Cervical  Ddd (Degenerative Disc Disease), Lumbosacral  Cervicogenic Headache  Cervico-Occipital Neuralgia  Osteoarthritis of Spine With Radiculopathy, Lumbar Region  Chronic Pain Syndrome   Last Assessment & Plan:  Located in his back. Pain starts approximately between the shoulder blades and radiates down, out and up. Describes pain as burning. Pain is constant. Improved with Gabapentin, not really helped by Oxycodone. Says Gaba works at night and he is able to get to sleep, but wears off around 4 am. He wakes up in excruciating pain and unable to go back to sleep. We discussed different modalities of pain control. Pt is followed  by Pain Management MD. He does not take any tylenol or ibuprofen during the day. We discussed plan to start Venlafaxine for Chronic pain. He will use a Lidocaine patch at night time to help him sleep, and take Naproxyn BID. We also discussed seeing Physiatry, but pt reports he saw Physiatry for about 5 years and they referred him to Pain Clinic. He has tried Lyrica in the past and unable to tolerate this. Tylenol is ineffective for pain. I am concerned that he is becoming physically dependent on the Oxycodone.   Chronic Musculoskeletal Pain  Neurogenic Pain  Chronic sacroiliac joint pain (Bilateral) (L>R)  Chronic shoulder pain (Bilateral) (L>R)  Chronic low back pain (1ry area of Pain) (Bilateral) (L>R)  Chronic lower extremity pain (3ry area of Pain) (Bilateral) (L>R)  Chronic lumbar radicular pain (Bilateral) (L>R) (Bilateral S1 dermatomal distribution)  Chronic hip pain (2ry area of Pain) (Bilateral) (L>R)  Chronic upper  back pain (Bilateral) (L>R)  Chronic neck pain (neck<shoulder) (L>R)  Chronic knee pain (Right)   An MRI of the right knee done on 02/18/2014 revealed acute to subacute subchondral insufficiency fracture of the lateral femoral condyle with associated intense marrow edema.   Cervical Spondylosis   An MRI of the cervical spine done on 11/23/2012 revealed mild left neural foraminal narrowing at C2-3 and mild right neural foraminal narrowing at C3-4. A follow-up MRI done in 10/11/2014 revealed moderate to severe upper cervical facet arthrosis and mild disc degeneration resulting in mild left neural foraminal stenosis at C2-C3 and mild right neural foraminal stenosis at C3-4 seems to be unchanged.   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)  Lumbar Spondylosis   An MRI done on 03/30/2014 revealed noncompressive disc bulges at L3-4, L4-L5, and L5-S1, which were interpreted as "Essentially normal study for a person of this age."   Cervical facet hypertrophy  Cervical facet syndrome (R>L)  Radicular Pain of Shoulder  Peripheral neuropathy (HCC) (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)  Lumbar facet syndrome (Bilateral) (L>R)  Polyradiculopathy  Hand Weakness  Chronic foot pain (Bilateral)  Chronic Use of Opiate for Therapeutic Purpose  Uncomplicated Opioid Dependence (Hcc)  History of Claustrophobia  Pharmacologic Therapy  Disorder of Skeletal System  Problems Influencing Health Status  Vitamin D Insufficiency  Opioid-induced constipation (OIC)  Long Term Current Use of Opiate Analgesic  Long Term Prescription Opiate Use  Opiate use (45 MME/Day)  Opioid Type Dependence (Hcc)   Last Assessment & Plan:  Concern for Opioid dependence. Pt reports he has been on Hydrocodone-APAP 10/325 mg in the past. Pain management MD changes to Oxycodone 10 mg "for more frequent use". Pt has since decreased his use to 5 mg. He reports he uses is sparingly, but "if [he] hurts [he's]  going to take it." He then verbalized when his daughter mentioned his mood swings, that he only has the mood swings when he's coming off the Oxycodone. He said he knows it doesn't really help the pain, but makes him feel better/ makes him feel good. He occasionally mixes the Oxycodone with the Gaba at night in order to sleep, even though he knows he's been told not too. We discussed this at length- brain chemicals out of balance (depression) and he depending on the opioids to give him the "high" since he is depressed. Pt agreed to this being the case. I highly encouraged pt to discuss this with pain Management.   Encounter for Therapeutic Drug Level Monitoring  History of Prostate Cancer  Sleep Apnea  Generalized Anxiety Disorder  Major Depressive Disorder, Single Episode, Severe With Anxious Distress (Hcc)   Last Assessment & Plan:  Pt here with his daughter with concerns of depression. Pt says his depression started when he started taking Gabapentin and got worse when his gabapentin dose was increased. However, he says the Gabapentin is helping his pain significantly. We had a long discussion about the Gaba helping pain and dealing with the SE vs. Stopping Gaba, pain become unbearable again, worsening the depression and getting stuck in the cycle. We discussed many alternatives or adjunct therapy for the back pain. Pt denies anxiety, but Daughter was adamantly disagreeing with him about his lack of anxiety. She reports he becomes irritable, snappish at times, worrisome. He reports "lately" he is more "down" doesn't want to do anything, disinterested when he is typically a very active man, on the tractor despite having back pain. We discussed the benefits of antidepressants for mood and as adjunct for chronic pain management. Pt was administered the Geriatric Depression Scale questionnaire, which he answered along with his daughter. Per the scoring for the test- >5 was indicative of depression. Pt scored a  10. De discussed the Gaba not causing the depression, but more likely exacerbating underlying depression. Daughter nodded in agreement. Pt agreeable to use of Venlafaxine. Started on 75 mg x 1 week, then increase to 150 mg. Encouraged pt to discuss concerns of Gaba with pain management MD, which he has an appointment with next week. Daughter told staff she was afraid to leave pt at home alone at times.   Heart Palpitations  Chest Pain  Primary Cardiomyopathy (Hcc)  Chest Pain With High Risk for Cardiac Etiology  Breathing Orally  History of Surgical Procedure  Class I Morbid obesity (HCC) (68% higher incidence of chronic low back problems)  Depression  High Cholesterol  Low Testosterone    Plan of Care  Problem-specific:  No problem-specific Assessment & Plan notes found for this encounter.  Mr. TYKWON FERA has a current medication list which includes the following long-term medication(s): calcium-vitamin d, gabapentin, nortriptyline, [START ON 09/20/2021] oxycodone, [START ON 10/20/2021] oxycodone, [START ON 11/19/2021] oxycodone, and simvastatin.  Pharmacotherapy (Medications Ordered): Meds ordered this encounter  Medications   oxyCODONE (OXY IR/ROXICODONE) 5 MG immediate release tablet    Sig: Take 1 tablet (5 mg total) by mouth every 8 (eight) hours as needed for severe pain. Must last 30 days    Dispense:  90 tablet    Refill:  0    DO NOT: delete (not duplicate); no partial-fill (will deny script to complete), no refill request (F/U required). DISPENSE: 1 day early if closed on fill date. WARN: No CNS-depressants within 8 hrs of med.   oxyCODONE (OXY IR/ROXICODONE) 5 MG immediate release tablet    Sig: Take 1 tablet (5 mg total) by mouth every 8 (eight) hours as needed for severe pain. Must last 30 days    Dispense:  90 tablet    Refill:  0    DO NOT: delete (not duplicate); no partial-fill (will deny script to complete), no refill request (F/U required). DISPENSE: 1 day early if  closed on fill date. WARN: No CNS-depressants within 8 hrs of med.   oxyCODONE (OXY IR/ROXICODONE) 5 MG immediate release tablet    Sig: Take 1 tablet (5 mg total) by mouth every 8 (eight) hours as needed for severe pain. Must last 30 days    Dispense:  90 tablet  Refill:  0    DO NOT: delete (not duplicate); no partial-fill (will deny script to complete), no refill request (F/U required). DISPENSE: 1 day early if closed on fill date. WARN: No CNS-depressants within 8 hrs of med.   Orders:  No orders of the defined types were placed in this encounter.  Follow-up plan:   Return in about 3 months (around 12/19/2021) for Eval-day (M,W), (F2F), (MM).     Interventional Therapies  Risk   Complexity Considerations:   Estimated body mass index is 25.1 kg/m as calculated from the following:   Height as of this encounter: 5' 9" (1.753 m).   Weight as of this encounter: 170 lb (77.1 kg). WNL   Planned   Pending:   Pending further evaluation   Under consideration:   Possible Qutenza treatments of the lower extremities depending on the results of the epidermal nerve fiber density biopsy conducted by Dr. Brigitte Pulse on 06/13/2020. Therapeutic right cervical facet RFA #1  Therapeutic right lumbar facet RFA #1  Therapeutic right SI joint RFA #1  Diagnostic bilateral Hip joint injection  Diagnostic Caudal ESI + diagnostic epidurogram  Diagnostic right Knee injection  Diagnostic right Genicular NB  Diagnostic bilateral IA shoulder injection  Diagnostic bilateral suprascapular NB    Completed:   Palliative bilateral cervical facet block x1  Palliative right lumbar facet block x3  Palliative left lumbar facet block x3  Palliative right SI joint block x2  Palliative left SI joint block x2  Palliative left lumbar facet RFA (last done on 03/21/2017)  Palliative left SI joint RFA (last done on 03/21/2017)  Therapeutic/palliative left CESI x3  Palliative left cervical facet RFA x1 (last done 03/11/2020)    Therapeutic   Palliative (PRN) options:   Palliative bilateral cervical facet block #2  Palliative right lumbar facet block #4  Palliative left lumbar facet block #4  Palliative right SI joint block #3  Palliative left SI joint block #3  Palliative left lumbar facet RFA (last done on 03/21/2017)  Palliative left SI joint RFA (last done on 03/21/2017)  Therapeutic/palliative left CESI #4  Palliative left cervical facet RFA #2 (last done 03/11/2020)    Recent Visits Date Type Provider Dept  06/17/21 Office Visit Milinda Pointer, MD [facility]  Showing recent visits within past 90 days and meeting all other requirements Today's Visits Date Type Provider Dept  09/09/21 Office Visit Milinda Pointer, MD [facility]  Showing today's visits and meeting all other requirements Future Appointments No visits were found meeting these conditions. Showing future appointments within next 90 days and meeting all other requirements  I discussed the assessment and treatment plan with the patient. The patient was provided an opportunity to ask questions and all were answered. The patient agreed with the plan and demonstrated an understanding of the instructions.  Patient advised to call back or seek an in-person evaluation if the symptoms or condition worsens.  Duration of encounter: 30 minutes.  Note by: Gaspar Cola, MD Date: 09/09/2021; Time: 12:36 PM

## 2021-09-09 ENCOUNTER — Ambulatory Visit: Payer: Medicare Other | Attending: Pain Medicine | Admitting: Pain Medicine

## 2021-09-09 ENCOUNTER — Other Ambulatory Visit: Payer: Self-pay

## 2021-09-09 ENCOUNTER — Encounter: Payer: Self-pay | Admitting: Pain Medicine

## 2021-09-09 VITALS — BP 130/88 | HR 81 | Temp 97.3°F | Resp 16 | Ht 69.0 in | Wt 170.0 lb

## 2021-09-09 DIAGNOSIS — Z79899 Other long term (current) drug therapy: Secondary | ICD-10-CM | POA: Insufficient documentation

## 2021-09-09 DIAGNOSIS — M5442 Lumbago with sciatica, left side: Secondary | ICD-10-CM | POA: Diagnosis present

## 2021-09-09 DIAGNOSIS — G8929 Other chronic pain: Secondary | ICD-10-CM | POA: Diagnosis present

## 2021-09-09 DIAGNOSIS — M5030 Other cervical disc degeneration, unspecified cervical region: Secondary | ICD-10-CM | POA: Diagnosis present

## 2021-09-09 DIAGNOSIS — M5441 Lumbago with sciatica, right side: Secondary | ICD-10-CM | POA: Insufficient documentation

## 2021-09-09 DIAGNOSIS — M79605 Pain in left leg: Secondary | ICD-10-CM | POA: Diagnosis present

## 2021-09-09 DIAGNOSIS — M79604 Pain in right leg: Secondary | ICD-10-CM | POA: Insufficient documentation

## 2021-09-09 DIAGNOSIS — Z79891 Long term (current) use of opiate analgesic: Secondary | ICD-10-CM | POA: Insufficient documentation

## 2021-09-09 DIAGNOSIS — M5137 Other intervertebral disc degeneration, lumbosacral region: Secondary | ICD-10-CM | POA: Diagnosis present

## 2021-09-09 DIAGNOSIS — M25559 Pain in unspecified hip: Secondary | ICD-10-CM | POA: Insufficient documentation

## 2021-09-09 DIAGNOSIS — G894 Chronic pain syndrome: Secondary | ICD-10-CM | POA: Diagnosis present

## 2021-09-09 DIAGNOSIS — M542 Cervicalgia: Secondary | ICD-10-CM | POA: Insufficient documentation

## 2021-09-09 MED ORDER — OXYCODONE HCL 5 MG PO TABS: 5.0000 mg | ORAL_TABLET | Freq: Three times a day (TID) | ORAL | 0 refills | Status: DC | PRN

## 2021-09-09 NOTE — Progress Notes (Signed)
Nursing Pain Medication Assessment:  Safety precautions to be maintained throughout the outpatient stay will include: orient to surroundings, keep bed in low position, maintain call bell within reach at all times, provide assistance with transfer out of bed and ambulation.  Medication Inspection Compliance: Pill count conducted under aseptic conditions, in front of the patient. Neither the pills nor the bottle was removed from the patient's sight at any time. Once count was completed pills were immediately returned to the patient in their original bottle.  Medication: Oxycodone IR Pill/Patch Count:  65 of 90 pills remain Pill/Patch Appearance: Markings consistent with prescribed medication Bottle Appearance: Standard pharmacy container. Clearly labeled. Filled Date: 12/ 19 / 2022 Last Medication intake:  Today

## 2021-12-10 NOTE — Progress Notes (Signed)
PROVIDER NOTE: Information contained herein reflects review and annotations entered in association with encounter. Interpretation of such information and data should be left to medically-trained personnel. Information provided to patient can be located elsewhere in the medical record under "Patient Instructions". Document created using STT-dictation technology, any transcriptional errors that may result from process are unintentional.  ?  ?Patient: Ricky Mercer  Service Category: E/M  Provider: Gaspar Cola, MD  ?DOB: September 27, 1950  DOS: 12/14/2021  Specialty: Interventional Pain Management  ?MRN: [id number]  Setting: Ambulatory outpatient  PCP: Sofie Hartigan, MD  ?Type: Established Patient    Referring Provider: Sofie Hartigan, MD  ?Location: Office  Delivery: Face-to-face    ? ?HPI  ?Ricky Mercer, a 71 y.o. year old male, is here today because of his Chronic pain syndrome [G89.4]. Mr. Gomer primary complain today is Back Pain (lower) ?Last encounter: My last encounter with him was on 09/09/2021. ?Pertinent problems: Mr. Battaglini has Hand weakness; Polyradiculopathy; Chronic low back pain (1ry area of Pain) (Bilateral) (L>R); Chronic lower extremity pain (3ry area of Pain) (Bilateral) (L>R); Chronic lumbar radicular pain (Bilateral) (L>R) (Bilateral S1 dermatomal distribution); Chronic hip pain (2ry area of Pain) (Bilateral) (L>R); Chronic upper back pain (Bilateral) (L>R); Chronic neck pain (neck<shoulder) (L>R); Chronic knee pain (Right); Cervical spondylosis; 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); Lumbar spondylosis; Cervical facet hypertrophy; Cervical facet syndrome (R>L); Radicular pain of shoulder; Peripheral neuropathy (HCC) (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); Lumbar facet syndrome (Bilateral) (L>R); Chronic shoulder pain (Bilateral) (L>R); Chronic sacroiliac joint pain (Bilateral) (L>R); Chronic musculoskeletal pain; Chronic foot pain  (Bilateral); Neurogenic pain; Chronic pain syndrome; Bilateral arm pain; DDD (degenerative disc disease), cervical; DDD (degenerative disc disease), lumbosacral; Cervicogenic headache; Cervico-occipital neuralgia; Osteoarthritis of spine with radiculopathy, lumbar region; Decreased sensation; Numbness and tingling; Weakness of left leg; Bilateral leg and foot pain; Cervicalgia; Chronic upper extremity pain (Bilateral) (L>R); Abnormal MRI, cervical spine (12/07/2019); Chronic burning sensation of feet (Bilateral); Numbness and tingling of both legs; Spondylosis without myelopathy or radiculopathy, cervical region; Osteoarthritis involving multiple joints; Chronic mid back pain; Total body pain; Idiopathic small fiber sensory neuropathy; Fibromyalgia; and Small fiber neuropathy on their pertinent problem list. ?Pain Assessment: Severity of Chronic pain is reported as a 6  (feet)/10. Location: Back Right, Left/pain radiaties everywhere. Onset: More than a month ago. Quality: Aching, Burning, Constant, Shooting, Spasm, Sharp. Timing: Constant. Modifying factor(s): Meds, hot water. ?Vitals:  height is '5\' 9"'$  (1.753 m) and weight is 170 lb (77.1 kg). His temperature is 97.5 ?F (36.4 ?C) (abnormal). His blood pressure is 107/80 and his pulse is 89. His oxygen saturation is 98%.  ? ?Reason for encounter: medication management.   The patient indicates doing well with the current medication regimen. No adverse reactions or side effects reported to the medications.  The patient refers that his worst pain is at night.  He is currently taking gabapentin 600 mg in a.m. and 600 at bedtime.  He refers that this is not controlling his pain.  Today I have reviewed his cervical, thoracic, and lumbar MRI and the type of pain that he is experiencing is a peripheral neuropathy of the upper and lower extremities affecting all fingers and toes.  Today I have checked the patient's pulses in his feet, and they are present and strong,  bilaterally with no decrease on either side.  At this point, there is really not much I can offer him since his neuropathy is not associated  with diabetes.  For this reason, I will be referring him to Dr. Brigitte Pulse for further treatment of his neuropathy.  I will continue with the opioids at the current dose and I will not be increasing his since opioids are notorious for not being very effective in the treatment of any type of neuropathic pain.  I am providing him with the oxycodone essentially to treat his none neuropathic pain.  In terms of the gabapentin, depending on his kidney function, and his ability to tolerate the medication, this could be slowly titrated up to as much as 900 mg 4 times daily.  If this does not work, the other alternative is to switch him to the Lyrica and again slowly titrated up to a maximum of 450 mg/day.  Any further recommendations I will defer them to the neurologist.  I will continue to manage his opioid analgesics, but at this time he is really not a candidate for any further interventional therapies. ? ?RTCB: 03/19/2022 ?Nonopioids transferred 06/25/2020: Vitamin D3, Neurontin, and Mobic. ? ?Pharmacotherapy Assessment  ?Analgesic: Oxycodone 5 mg, 1 tab PO q 8 hrs (15 mg/day of oxycodone) ?MME/day: 22.5 mg/day.  ? ?Monitoring: ?[place] PMP: PDMP reviewed during this encounter.       ?Pharmacotherapy: No side-effects or adverse reactions reported. ?Compliance: No problems identified. ?Effectiveness: Clinically acceptable. ? ?Chauncey Fischer, RN  12/14/2021 10:42 AM  Sign when Signing Visit ?Nursing Pain Medication Assessment:  ?Safety precautions to be maintained throughout the outpatient stay will include: orient to surroundings, keep bed in low position, maintain call bell within reach at all times, provide assistance with transfer out of bed and ambulation.  ?Medication Inspection Compliance: Pill count conducted under aseptic conditions, in front of the patient. Neither the pills nor the  bottle was removed from the patient's sight at any time. Once count was completed pills were immediately returned to the patient in their original bottle. ? ?Medication: Oxycodone IR ?Pill/Patch Count:  42 of 90 pills remain ?Pill/Patch Appearance: Markings consistent with prescribed medication ?Bottle Appearance: Standard pharmacy container. Clearly labeled. ?Filled Date: 3 / 72 / 2023 ?Last Medication intake:  TodaySafety precautions to be maintained throughout the outpatient stay will include: orient to surroundings, keep bed in low position, maintain call bell within reach at all times, provide assistance with transfer out of bed and ambulation.  ?   UDS:  ?Summary  ?Date Value Ref Range Status  ?03/24/2021 Note  Final  ?  Comment:  ?  ==================================================================== ?[employer] (MW) ?==================================================================== ?Test                             Result       Flag       Units ? ?Drug Present and Declared for Prescription Verification ?  Oxycodone                      269          EXPECTED   ng/mg creat ?  Oxymorphone                    2180         EXPECTED   ng/mg creat ?  Noroxycodone                   853          EXPECTED   ng/mg creat ?  Noroxymorphone  430          EXPECTED   ng/mg creat ?   Sources of oxycodone are scheduled prescription medications. ?   Oxymorphone, noroxycodone, and noroxymorphone are expected ?   metabolites of oxycodone. Oxymorphone is also available as a ?   scheduled prescription medication. ? ?==================================================================== ?Test                      Result    Flag   Units      Ref Range ?  Creatinine              172              mg/dL      >=20 ?==================================================================== ?Declared Medications: ? The flagging and interpretation on this report are based on the ? following declared medications.   Unexpected results may arise from ? inaccuracies in the declared medications. ? ? **Note: The testing scope of this panel includes these medications: ? ? Oxycodone ? ? **Note: The testing scope of this panel does not include the ? f

## 2021-12-14 ENCOUNTER — Encounter: Payer: Self-pay | Admitting: Pain Medicine

## 2021-12-14 ENCOUNTER — Ambulatory Visit: Payer: Medicare Other | Attending: Pain Medicine | Admitting: Pain Medicine

## 2021-12-14 VITALS — BP 107/80 | HR 89 | Temp 97.5°F | Ht 69.0 in | Wt 170.0 lb

## 2021-12-14 DIAGNOSIS — M5030 Other cervical disc degeneration, unspecified cervical region: Secondary | ICD-10-CM | POA: Diagnosis present

## 2021-12-14 DIAGNOSIS — M5137 Other intervertebral disc degeneration, lumbosacral region: Secondary | ICD-10-CM | POA: Diagnosis present

## 2021-12-14 DIAGNOSIS — M25559 Pain in unspecified hip: Secondary | ICD-10-CM | POA: Insufficient documentation

## 2021-12-14 DIAGNOSIS — Z79891 Long term (current) use of opiate analgesic: Secondary | ICD-10-CM | POA: Insufficient documentation

## 2021-12-14 DIAGNOSIS — M5442 Lumbago with sciatica, left side: Secondary | ICD-10-CM | POA: Diagnosis not present

## 2021-12-14 DIAGNOSIS — G894 Chronic pain syndrome: Secondary | ICD-10-CM | POA: Diagnosis not present

## 2021-12-14 DIAGNOSIS — M79604 Pain in right leg: Secondary | ICD-10-CM | POA: Insufficient documentation

## 2021-12-14 DIAGNOSIS — M5441 Lumbago with sciatica, right side: Secondary | ICD-10-CM | POA: Insufficient documentation

## 2021-12-14 DIAGNOSIS — Z79899 Other long term (current) drug therapy: Secondary | ICD-10-CM | POA: Diagnosis present

## 2021-12-14 DIAGNOSIS — G608 Other hereditary and idiopathic neuropathies: Secondary | ICD-10-CM | POA: Diagnosis not present

## 2021-12-14 DIAGNOSIS — G8929 Other chronic pain: Secondary | ICD-10-CM | POA: Insufficient documentation

## 2021-12-14 DIAGNOSIS — M542 Cervicalgia: Secondary | ICD-10-CM | POA: Insufficient documentation

## 2021-12-14 DIAGNOSIS — M79605 Pain in left leg: Secondary | ICD-10-CM | POA: Insufficient documentation

## 2021-12-14 MED ORDER — OXYCODONE HCL 5 MG PO TABS: 5.0000 mg | ORAL_TABLET | Freq: Three times a day (TID) | ORAL | 0 refills | Status: DC | PRN

## 2021-12-14 NOTE — Patient Instructions (Signed)
____________________________________________________________________________________________ ? ?Medication Rules ? ?Purpose: To inform patients, and their family members, of our rules and regulations. ? ?Applies to: All patients receiving prescriptions (written or electronic). ? ?Pharmacy of record: Pharmacy where electronic prescriptions will be sent. If written prescriptions are taken to a different pharmacy, please inform the nursing staff. The pharmacy listed in the electronic medical record should be the one where you would like electronic prescriptions to be sent. ? ?Electronic prescriptions: In compliance with the [place] Strengthen Opioid Misuse Prevention (STOP) Act of 2017 (Session Law 2017-74/H243), effective September 13, 2018, all controlled substances must be electronically prescribed. Calling prescriptions to the pharmacy will cease to exist. ? ?Prescription refills: Only during scheduled appointments. Applies to all prescriptions. ? ?NOTE: The following applies primarily to controlled substances (Opioid* Pain Medications).  ? ?Type of encounter (visit): For patients receiving controlled substances, face-to-face visits are required. (Not an option or up to the patient.) ? ?Patient's responsibilities: ?Pain Pills: Bring all pain pills to every appointment (except for procedure appointments). ?Pill Bottles: Bring pills in original pharmacy bottle. Always bring the newest bottle. Bring bottle, even if empty. ?Medication refills: You are responsible for knowing and keeping track of what medications you take and those you need refilled. ?The day before your appointment: write a list of all prescriptions that need to be refilled. ?The day of the appointment: give the list to the admitting nurse. Prescriptions will be written only during appointments. No prescriptions will be written on procedure days. ?If you forget a medication: it will not be "Called in", "Faxed", or "electronically sent". You will  need to get another appointment to get these prescribed. ?No early refills. Do not call asking to have your prescription filled early. ?Prescription Accuracy: You are responsible for carefully inspecting your prescriptions before leaving our office. Have the discharge nurse carefully go over each prescription with you, before taking them home. Make sure that your name is accurately spelled, that your address is correct. Check the name and dose of your medication to make sure it is accurate. Check the number of pills, and the written instructions to make sure they are clear and accurate. Make sure that you are given enough medication to last until your next medication refill appointment. ?Taking Medication: Take medication as prescribed. When it comes to controlled substances, taking less pills or less frequently than prescribed is permitted and encouraged. ?Never take more pills than instructed. ?Never take medication more frequently than prescribed.  ?Inform other Doctors: Always inform, all of your healthcare providers, of all the medications you take. ?Pain Medication from other Providers: You are not allowed to accept any additional pain medication from any other Doctor or Healthcare provider. There are two exceptions to this rule. (see below) In the event that you require additional pain medication, you are responsible for notifying us, as stated below. ?Cough Medicine: Often these contain an opioid, such as codeine or hydrocodone. Never accept or take cough medicine containing these opioids if you are already taking an opioid* medication. The combination may cause respiratory failure and death. ?Medication Agreement: You are responsible for carefully reading and following our Medication Agreement. This must be signed before receiving any prescriptions from our practice. Safely store a copy of your signed Agreement. Violations to the Agreement will result in no further prescriptions. (Additional copies of our  Medication Agreement are available upon request.) ?Laws, Rules, & Regulations: All patients are expected to follow all Federal and State Laws, Statutes, Rules, & Regulations. Ignorance of   the Laws does not constitute a valid excuse.  ?Illegal drugs and Controlled Substances: The use of illegal substances (including, but not limited to marijuana and its derivatives) and/or the illegal use of any controlled substances is strictly prohibited. Violation of this rule may result in the immediate and permanent discontinuation of any and all prescriptions being written by our practice. The use of any illegal substances is prohibited. ?Adopted CDC guidelines & recommendations: Target dosing levels will be at or below 60 MME/day. Use of benzodiazepines** is not recommended. ? ?Exceptions: There are only two exceptions to the rule of not receiving pain medications from other Healthcare Providers. ?Exception #1 (Emergencies): In the event of an emergency (i.e.: accident requiring emergency care), you are allowed to receive additional pain medication. However, you are responsible for: As soon as you are able, call our office [phone], at any time of the day or night, and leave a message stating your name, the date and nature of the emergency, and the name and dose of the medication prescribed. In the event that your call is answered by a member of our staff, make sure to document and save the date, time, and the name of the person that took your information.  ?Exception #2 (Planned Surgery): In the event that you are scheduled by another doctor or dentist to have any type of surgery or procedure, you are allowed (for a period no longer than 30 days), to receive additional pain medication, for the acute post-op pain. However, in this case, you are responsible for picking up a copy of our "Post-op Pain Management for Surgeons" handout, and giving it to your surgeon or dentist. This document is available at our office, and  does not require an appointment to obtain it. Simply go to our office during business hours (Monday-Thursday from 8:00 AM to 4:00 PM) (Friday 8:00 AM to 12:00 Noon) or if you have a scheduled appointment with us, prior to your surgery, and ask for it by name. In addition, you are responsible for: calling our office [phone], at any time of the day or night, and leaving a message stating your name, name of your surgeon, type of surgery, and date of procedure or surgery. Failure to comply with your responsibilities may result in termination of therapy involving the controlled substances. ?Medication Agreement Violation. Following the above rules, including your responsibilities will help you in avoiding a Medication Agreement Violation (?Breaking your Pain Medication Contract?). ? ?*Opioid medications include: morphine, codeine, oxycodone, oxymorphone, hydrocodone, hydromorphone, meperidine, tramadol, tapentadol, buprenorphine, fentanyl, methadone. ?**Benzodiazepine medications include: diazepam (Valium), alprazolam (Xanax), clonazepam (Klonopine), lorazepam (Ativan), clorazepate (Tranxene), chlordiazepoxide (Librium), estazolam (Prosom), oxazepam (Serax), temazepam (Restoril), triazolam (Halcion) ?(Last updated: 06/10/2021) ?____________________________________________________________________________________________ ? ____________________________________________________________________________________________ ? ?Medication Recommendations and Reminders ? ?Applies to: All patients receiving prescriptions (written and/or electronic). ? ?Medication Rules & Regulations: These rules and regulations exist for your safety and that of others. They are not flexible and neither are we. Dismissing or ignoring them will be considered "non-compliance" with medication therapy, resulting in complete and irreversible termination of such therapy. (See document titled "Medication Rules" for more details.) In all conscience,  because of safety reasons, we cannot continue providing a therapy where the patient does not follow instructions. ? ?Pharmacy of record:  ?Definition: This is the pharmacy where your electronic prescriptions w

## 2021-12-14 NOTE — Progress Notes (Signed)
Nursing Pain Medication Assessment:  ?Safety precautions to be maintained throughout the outpatient stay will include: orient to surroundings, keep bed in low position, maintain call bell within reach at all times, provide assistance with transfer out of bed and ambulation.  ?Medication Inspection Compliance: Pill count conducted under aseptic conditions, in front of the patient. Neither the pills nor the bottle was removed from the patient's sight at any time. Once count was completed pills were immediately returned to the patient in their original bottle. ? ?Medication: Oxycodone IR ?Pill/Patch Count:  42 of 90 pills remain ?Pill/Patch Appearance: Markings consistent with prescribed medication ?Bottle Appearance: Standard pharmacy container. Clearly labeled. ?Filled Date: 3 / 2 / 2023 ?Last Medication intake:  TodaySafety precautions to be maintained throughout the outpatient stay will include: orient to surroundings, keep bed in low position, maintain call bell within reach at all times, provide assistance with transfer out of bed and ambulation.  ?

## 2022-02-02 ENCOUNTER — Telehealth: Payer: Self-pay | Admitting: Pain Medicine

## 2022-02-02 ENCOUNTER — Telehealth: Payer: Self-pay

## 2022-02-02 NOTE — Telephone Encounter (Signed)
Patient wants to know if he can get some other meds. Pain meds isn't working. Please give patient a call. Thanks

## 2022-02-02 NOTE — Telephone Encounter (Signed)
Called patient and states he needs medication between his pain medication. Instructed to call PCP or need to schedule a follow up appt to address this need. Patient states Pc will not increase Gabapentin.

## 2022-03-08 ENCOUNTER — Encounter: Payer: Medicare Other | Admitting: Pain Medicine

## 2022-03-13 NOTE — Progress Notes (Unsigned)
PROVIDER NOTE: Information contained herein reflects review and annotations entered in association with encounter. Interpretation of such information and data should be left to medically-trained personnel. Information provided to patient can be located elsewhere in the medical record under "Patient Instructions". Document created using STT-dictation technology, any transcriptional errors that may result from process are unintentional.    Patient: Ricky Mercer  Service Category: E/M  Provider: Gaspar Cola, MD  DOB: 07-17-1951  DOS: 03/15/2022  Specialty: Interventional Pain Management  MRN: [id number]  Setting: Ambulatory outpatient  PCP: Sofie Hartigan, MD  Type: Established Patient    Referring Provider: Sofie Hartigan, MD  Location: Office  Delivery: Face-to-face     HPI  Ricky Mercer, a 71 y.o. year old male, is here today because of his No primary diagnosis found.. Ricky Mercer primary complain today is No chief complaint on file. Last encounter: My last encounter with him was on 02/02/2022. Pertinent problems: Ricky Mercer has Hand weakness; Polyradiculopathy; Chronic low back pain (1ry area of Pain) (Bilateral) (L>R); Chronic lower extremity pain (3ry area of Pain) (Bilateral) (L>R); Chronic lumbar radicular pain (Bilateral) (L>R) (Bilateral S1 dermatomal distribution); Chronic hip pain (2ry area of Pain) (Bilateral) (L>R); Chronic upper back pain (Bilateral) (L>R); Chronic neck pain (neck<shoulder) (L>R); Chronic knee pain (Right); Cervical spondylosis; 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); Lumbar spondylosis; Cervical facet hypertrophy; Cervical facet syndrome (R>L); Radicular pain of shoulder; Peripheral neuropathy (HCC) (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); Lumbar facet syndrome (Bilateral) (L>R); Chronic shoulder pain (Bilateral) (L>R); Chronic sacroiliac joint pain (Bilateral) (L>R); Chronic musculoskeletal pain; Chronic foot pain  (Bilateral); Neurogenic pain; Chronic pain syndrome; Bilateral arm pain; DDD (degenerative disc disease), cervical; DDD (degenerative disc disease), lumbosacral; Cervicogenic headache; Cervico-occipital neuralgia; Osteoarthritis of spine with radiculopathy, lumbar region; Decreased sensation; Numbness and tingling; Weakness of left leg; Bilateral leg and foot pain; Cervicalgia; Chronic upper extremity pain (Bilateral) (L>R); Abnormal MRI, cervical spine (12/07/2019); Chronic burning sensation of feet (Bilateral); Numbness and tingling of both legs; Spondylosis without myelopathy or radiculopathy, cervical region; Osteoarthritis involving multiple joints; Chronic mid back pain; Total body pain; Idiopathic small fiber sensory neuropathy; Fibromyalgia; and Small fiber neuropathy on their pertinent problem list. Pain Assessment: Severity of   is reported as a  /10. Location:    / . Onset:  . Quality:  . Timing:  . Modifying factor(s):  Marland Kitchen Vitals:  vitals were not taken for this visit.   Reason for encounter: medication management. ***  UDS ordered today.   RTCB: 06/17/2022 Nonopioids transferred 06/25/2020: Vitamin D3, Neurontin, and Mobic.  Pharmacotherapy Assessment  Analgesic: Oxycodone 5 mg, 1 tab PO q 8 hrs (15 mg/day of oxycodone) MME/day: 22.5 mg/day.   Monitoring: [place] PMP: PDMP reviewed during this encounter.       Pharmacotherapy: No side-effects or adverse reactions reported. Compliance: No problems identified. Effectiveness: Clinically acceptable.  No notes on file  UDS:  Summary  Date Value Ref Range Status  03/24/2021 Note  Final    Comment:    ==================================================================== [employer] (MW) ==================================================================== Test                             Result       Flag       Units  Drug Present and Declared for Prescription Verification   Oxycodone  269          EXPECTED    ng/mg creat   Oxymorphone                    2180         EXPECTED   ng/mg creat   Noroxycodone                   853          EXPECTED   ng/mg creat   Noroxymorphone                 430          EXPECTED   ng/mg creat    Sources of oxycodone are scheduled prescription medications.    Oxymorphone, noroxycodone, and noroxymorphone are expected    metabolites of oxycodone. Oxymorphone is also available as a    scheduled prescription medication.  ==================================================================== Test                      Result    Flag   Units      Ref Range   Creatinine              172              mg/dL      >=20 ==================================================================== Declared Medications:  The flagging and interpretation on this report are based on the  following declared medications.  Unexpected results may arise from  inaccuracies in the declared medications.   **Note: The testing scope of this panel includes these medications:   Oxycodone   **Note: The testing scope of this panel does not include the  following reported medications:   Aspirin  Bupropion (Wellbutrin)  Cholecalciferol  Gabapentin (Neurontin)  Meloxicam (Mobic)  Nortriptyline (Pamelor)  Prednisone (Deltasone)  Simvastatin (Zocor) ==================================================================== For clinical consultation, please call [phone]. ====================================================================      ROS  Constitutional: Denies any fever or chills Gastrointestinal: No reported hemesis, hematochezia, vomiting, or acute GI distress Musculoskeletal: Denies any acute onset joint swelling, redness, loss of ROM, or weakness Neurological: No reported episodes of acute onset apraxia, aphasia, dysarthria, agnosia, amnesia, paralysis, loss of coordination, or loss of consciousness  Medication Review  aspirin, calcium-vitamin D, gabapentin, oxyCODONE,  pantoprazole, and simvastatin  History Review  Allergy: Ricky Mercer has No Known Allergies. Drug: Ricky Mercer  reports no history of drug use. Alcohol:  reports no history of alcohol use. Tobacco:  reports that he has never smoked. He has never used smokeless tobacco. Social: Ricky Mercer  reports that he has never smoked. He has never used smokeless tobacco. He reports that he does not drink alcohol and does not use drugs. Medical:  has a past medical history of Acute postoperative pain (03/21/2017), Anginal pain ([place]), Bilateral arm pain, Cardiomyopathy ([place]), Cardiomyopathy, idiopathic ([place]), Chronic back pain, Chronic pain syndrome, Depression, Diverticulosis, Dupuytren's contracture, Erosive gastritis, Fibromyalgia, Foot pain, bilateral, GERD (gastroesophageal reflux disease), Hand weakness, Hyperlipidemia, Knee pain, Midline low back pain, Neuromuscular disorder (HCC), Opioid dependence ([place]), Osteoarthritis, Pain in neck, Palpitations, Prostate cancer ([place]), Reactive airway disease, Seasonal allergies, Situational anxiety, Skin cancer, Sleep apnea, and Tubular adenoma of colon. Surgical: Ricky Mercer  has a past surgical history that includes Nose surgery; Hand surgery (Bilateral); Colonoscopy (10/24/2012); Upper gi endoscopy (12/18/2012); Tonsillectomy (2009); Skin cancer excision (Left, 2015); Mass excision (Right, 07/18/2015); Esophagogastroduodenoscopy (egd) with propofol (N/A, 10/28/2015); prostate seeding; Hernia repair; Cholecystectomy (N/A, 12/02/2015); Esophagogastroduodenoscopy (  N/A, 11/17/2017); Colonoscopy with propofol (N/A, 11/17/2017); Esophagogastroduodenoscopy (N/A, 03/24/2020); and Colonoscopy with propofol (N/A, 11/03/2020). Family: family history includes Colon cancer in his brother; Hyperlipidemia in his sister; Lung cancer in his father; Prostate cancer in his brother.  Laboratory Chemistry Profile   Renal Lab Results  Component Value Date   BUN 11 08/04/2020   CREATININE 0.99  08/04/2020   BCR 12 08/06/2019   GFRAA 99 08/06/2019   GFRNONAA >60 08/04/2020    Hepatic Lab Results  Component Value Date   AST 20 08/04/2020   ALT 15 08/04/2020   ALBUMIN 3.8 08/04/2020   ALKPHOS 47 08/04/2020   LIPASE 24 08/04/2020    Electrolytes Lab Results  Component Value Date   NA 139 08/04/2020   K 4.3 08/04/2020   CL 102 08/04/2020   CALCIUM 8.8 (L) 08/04/2020   MG 2.0 08/06/2019    Bone Lab Results  Component Value Date   25OHVITD1 49 08/06/2019   25OHVITD2 <1.0 08/06/2019   25OHVITD3 49 08/06/2019    Inflammation (CRP: Acute Phase) (ESR: Chronic Phase) Lab Results  Component Value Date   CRP <1 08/06/2019   ESRSEDRATE 2 08/06/2019         Note: Above Lab results reviewed.  Recent Imaging Review  CT CHEST WO CONTRAST CLINICAL DATA:  Right lower chest pain for 5 months, increasing, no known injury  EXAM: CT CHEST WITHOUT CONTRAST  TECHNIQUE: Multidetector CT imaging of the chest was performed following the standard protocol without IV contrast.  COMPARISON:  11/15/2013  FINDINGS: Cardiovascular: No significant vascular findings. Normal heart size. No pericardial effusion.  Mediastinum/Nodes: No enlarged mediastinal, hilar, or axillary lymph nodes. Thyroid gland, trachea, and esophagus demonstrate no significant findings.  Lungs/Pleura: There is a small, definitively benign calcified nodule of the right upper lobe (series 3, image 40). There are occasional tiny noncalcified nodules, for example a 3 mm nodule of the lateral segment right middle lobe, stable compared to prior examination dated 2015 and definitively benign (series 3, image 96). No pleural effusion or pneumothorax.  Upper Abdomen: No acute abnormality.  Musculoskeletal: No chest wall mass or suspicious bone lesions identified.  IMPRESSION: 1. No non-contrast CT findings of the chest to explain right-sided chest pain.  2. Occasional tiny, stable, and definitively benign  small pulmonary nodules for which no further follow-up is required.  Electronically Signed   By: Eddie Candle M.D.   On: 09/17/2020 13:26 Note: Reviewed        Physical Exam  General appearance: Well nourished, well developed, and well hydrated. In no apparent acute distress Mental status: Alert, oriented x 3 (person, place, & time)       Respiratory: No evidence of acute respiratory distress Eyes: PERLA Vitals: There were no vitals taken for this visit. BMI: Estimated body mass index is 25.1 kg/m as calculated from the following:   Height as of 12/14/21: 5' 9"  (1.753 m).   Weight as of 12/14/21: 170 lb (77.1 kg). Ideal: Patient weight not recorded  Assessment   Diagnosis Status  1. Chronic pain syndrome   2. Chronic low back pain (1ry area of Pain) (Bilateral) (L>R)   3. Chronic hip pain (2ry area of Pain) (Bilateral) (L>R)   4. Chronic lower extremity pain (3ry area of Pain) (Bilateral) (L>R)   5. Cervicalgia   6. DDD (degenerative disc disease), lumbosacral   7. DDD (degenerative disc disease), cervical   8. Pharmacologic therapy   9. Chronic use of opiate for therapeutic purpose  10. Encounter for medication management   11. Encounter for chronic pain management    Controlled Controlled Controlled   Updated Problems: No problems updated.  Plan of Care  Problem-specific:  No problem-specific Assessment & Plan notes found for this encounter.  Ricky Mercer has a current medication list which includes the following long-term medication(s): calcium-vitamin d, gabapentin, oxycodone, and simvastatin.  Pharmacotherapy (Medications Ordered): No orders of the defined types were placed in this encounter.  Orders:  No orders of the defined types were placed in this encounter.  Follow-up plan:   No follow-ups on file.     Interventional Therapies  Risk  Complexity Considerations:   Estimated body mass index is 25.1 kg/m as calculated from the following:    Height as of this encounter: 5' 9"  (1.753 m).   Weight as of this encounter: 170 lb (77.1 kg). WNL   Planned  Pending:      Under consideration:      Completed:   Palliative bilateral cervical facet block x1  Palliative right lumbar facet block x3  Palliative left lumbar facet block x3  Palliative right SI joint block x2  Palliative left SI joint block x2  Palliative left lumbar facet RFA (last done on 03/21/2017)  Palliative left SI joint RFA (last done on 03/21/2017)  Therapeutic/palliative left CESI x3  Palliative left cervical facet RFA x1 (last done 03/11/2020)   Therapeutic  Palliative (PRN) options:   Palliative bilateral cervical facet block #2  Palliative right lumbar facet block #4  Palliative left lumbar facet block #4  Palliative right SI joint block #3  Palliative left SI joint block #3  Palliative left lumbar facet RFA (last done on 03/21/2017)  Palliative left SI joint RFA (last done on 03/21/2017)  Therapeutic/palliative left CESI #4  Palliative left cervical facet RFA #2 (last done 03/11/2020)     Recent Visits Date Type Provider Dept  12/14/21 Office Visit Milinda Pointer, MD [facility]  Showing recent visits within past 90 days and meeting all other requirements Future Appointments Date Type Provider Dept  03/15/22 Appointment Milinda Pointer, MD [facility]  Showing future appointments within next 90 days and meeting all other requirements  I discussed the assessment and treatment plan with the patient. The patient was provided an opportunity to ask questions and all were answered. The patient agreed with the plan and demonstrated an understanding of the instructions.  Patient advised to call back or seek an in-person evaluation if the symptoms or condition worsens.  Duration of encounter: *** minutes.  Total time on encounter, as per AMA guidelines included both the face-to-face and non-face-to-face time personally spent by the  physician and/or other qualified health care professional(s) on the day of the encounter (includes time in activities that require the physician or other qualified health care professional and does not include time in activities normally performed by clinical staff). Physician's time may include the following activities when performed: preparing to see the patient (eg, review of tests, pre-charting review of records) obtaining and/or reviewing separately obtained history performing a medically appropriate examination and/or evaluation counseling and educating the patient/family/caregiver ordering medications, tests, or procedures referring and communicating with other health care professionals (when not separately reported) documenting clinical information in the electronic or other health record independently interpreting results (not separately reported) and communicating results to the patient/ family/caregiver care coordination (not separately reported)  Note by: Gaspar Cola, MD Date: 03/15/2022; Time: 12:04 PM

## 2022-03-15 ENCOUNTER — Ambulatory Visit: Payer: Medicare Other | Attending: Pain Medicine | Admitting: Pain Medicine

## 2022-03-15 ENCOUNTER — Encounter: Payer: Self-pay | Admitting: Pain Medicine

## 2022-03-15 DIAGNOSIS — M5030 Other cervical disc degeneration, unspecified cervical region: Secondary | ICD-10-CM | POA: Diagnosis present

## 2022-03-15 DIAGNOSIS — M5137 Other intervertebral disc degeneration, lumbosacral region: Secondary | ICD-10-CM | POA: Diagnosis present

## 2022-03-15 DIAGNOSIS — M79604 Pain in right leg: Secondary | ICD-10-CM | POA: Insufficient documentation

## 2022-03-15 DIAGNOSIS — G894 Chronic pain syndrome: Secondary | ICD-10-CM | POA: Insufficient documentation

## 2022-03-15 DIAGNOSIS — M542 Cervicalgia: Secondary | ICD-10-CM | POA: Insufficient documentation

## 2022-03-15 DIAGNOSIS — M5442 Lumbago with sciatica, left side: Secondary | ICD-10-CM | POA: Diagnosis present

## 2022-03-15 DIAGNOSIS — M5441 Lumbago with sciatica, right side: Secondary | ICD-10-CM | POA: Insufficient documentation

## 2022-03-15 DIAGNOSIS — M79605 Pain in left leg: Secondary | ICD-10-CM | POA: Diagnosis present

## 2022-03-15 DIAGNOSIS — G8929 Other chronic pain: Secondary | ICD-10-CM | POA: Diagnosis present

## 2022-03-15 DIAGNOSIS — Z79899 Other long term (current) drug therapy: Secondary | ICD-10-CM | POA: Insufficient documentation

## 2022-03-15 DIAGNOSIS — Z79891 Long term (current) use of opiate analgesic: Secondary | ICD-10-CM | POA: Insufficient documentation

## 2022-03-15 DIAGNOSIS — M25559 Pain in unspecified hip: Secondary | ICD-10-CM | POA: Diagnosis present

## 2022-03-15 MED ORDER — MORPHINE SULFATE ER 15 MG PO TBCR: 15.0000 mg | EXTENDED_RELEASE_TABLET | Freq: Two times a day (BID) | ORAL | 0 refills | Status: DC

## 2022-03-15 MED ORDER — MORPHINE SULFATE ER 15 MG PO TBCR: 15.0000 mg | EXTENDED_RELEASE_TABLET | Freq: Two times a day (BID) | ORAL | 0 refills | Status: AC

## 2022-03-15 NOTE — Patient Instructions (Addendum)
1.  Take over-the-counter magnesium 500 mg, 1 tablet at bedtime. 2.  Talk to your primary care physician about the sleep apnea and a treatment for it. 3.  Collect all of the old oxycodone pills at home and bring them back to your next appointment for proper disposal. 4.  Make sure not to break the MS Contin tablets/capsules. ____________________________________________________________________________________________  Medication Rules  Purpose: To inform patients, and their family members, of our rules and regulations.  Applies to: All patients receiving prescriptions (written or electronic).  Pharmacy of record: Pharmacy where electronic prescriptions will be sent. If written prescriptions are taken to a different pharmacy, please inform the nursing staff. The pharmacy listed in the electronic medical record should be the one where you would like electronic prescriptions to be sent.  Electronic prescriptions: In compliance with the [place] (STOP) Act of 2017 (Session Lanny Cramp [phone]), effective September 13, 2018, all controlled substances must be electronically prescribed. Calling prescriptions to the pharmacy will cease to exist.  Prescription refills: Only during scheduled appointments. Applies to all prescriptions.  NOTE: The following applies primarily to controlled substances (Opioid* Pain Medications).   Type of encounter (visit): For patients receiving controlled substances, face-to-face visits are required. (Not an option or up to the patient.)  Patient's responsibilities: Pain Pills: Bring all pain pills to every appointment (except for procedure appointments). Pill Bottles: Bring pills in original pharmacy bottle. Always bring the newest bottle. Bring bottle, even if empty. Medication refills: You are responsible for knowing and keeping track of what medications you take and those you need refilled. The day before your appointment: write a  list of all prescriptions that need to be refilled. The day of the appointment: give the list to the admitting nurse. Prescriptions will be written only during appointments. No prescriptions will be written on procedure days. If you forget a medication: it will not be "Called in", "Faxed", or "electronically sent". You will need to get another appointment to get these prescribed. No early refills. Do not call asking to have your prescription filled early. Prescription Accuracy: You are responsible for carefully inspecting your prescriptions before leaving our office. Have the discharge nurse carefully go over each prescription with you, before taking them home. Make sure that your name is accurately spelled, that your address is correct. Check the name and dose of your medication to make sure it is accurate. Check the number of pills, and the written instructions to make sure they are clear and accurate. Make sure that you are given enough medication to last until your next medication refill appointment. Taking Medication: Take medication as prescribed. When it comes to controlled substances, taking less pills or less frequently than prescribed is permitted and encouraged. Never take more pills than instructed. Never take medication more frequently than prescribed.  Inform other Doctors: Always inform, all of your healthcare providers, of all the medications you take. Pain Medication from other Providers: You are not allowed to accept any additional pain medication from any other Doctor or Healthcare provider. There are two exceptions to this rule. (see below) In the event that you require additional pain medication, you are responsible for notifying us, as stated below. Cough Medicine: Often these contain an opioid, such as codeine or hydrocodone. Never accept or take cough medicine containing these opioids if you are already taking an opioid* medication. The combination may cause respiratory failure and  death. Medication Agreement: You are responsible for carefully reading and following our Medication  Agreement. This must be signed before receiving any prescriptions from our practice. Safely store a copy of your signed Agreement. Violations to the Agreement will result in no further prescriptions. (Additional copies of our Medication Agreement are available upon request.) Laws, Rules, & Regulations: All patients are expected to follow all Federal and [employer], [employer], Rules, [facility]. Ignorance of the Laws does not constitute a valid excuse.  Illegal drugs and Controlled Substances: The use of illegal substances (including, but not limited to marijuana and its derivatives) and/or the illegal use of any controlled substances is strictly prohibited. Violation of this rule may result in the immediate and permanent discontinuation of any and all prescriptions being written by our practice. The use of any illegal substances is prohibited. Adopted CDC guidelines & recommendations: Target dosing levels will be at or below 60 MME/day. Use of benzodiazepines** is not recommended.  Exceptions: There are only two exceptions to the rule of not receiving pain medications from other Healthcare Providers. Exception #1 (Emergencies): In the event of an emergency (i.e.: accident requiring emergency care), you are allowed to receive additional pain medication. However, you are responsible for: As soon as you are able, call our office [phone], at any time of the day or night, and leave a message stating your name, the date and nature of the emergency, and the name and dose of the medication prescribed. In the event that your call is answered by a member of our staff, make sure to document and save the date, time, and the name of the person that took your information.  Exception #2 (Planned Surgery): In the event that you are scheduled by another doctor or dentist to have any type of surgery or procedure, you  are allowed (for a period no longer than 30 days), to receive additional pain medication, for the acute post-op pain. However, in this case, you are responsible for picking up a copy of our "Post-op Pain Management for Surgeons" handout, and giving it to your surgeon or dentist. This document is available at our office, and does not require an appointment to obtain it. Simply go to our office during business hours (Monday-Thursday from 8:00 AM to 4:00 PM) (Friday 8:00 AM to 12:00 Noon) or if you have a scheduled appointment with [place], prior to your surgery, and ask for it by name. In addition, you are responsible for: calling our office [phone], at any time of the day or night, and leaving a message stating your name, name of your surgeon, type of surgery, and date of procedure or surgery. Failure to comply with your responsibilities may result in termination of therapy involving the controlled substances. Medication Agreement Violation. Following the above rules, including your responsibilities will help you in avoiding a Medication Agreement Violation ("Breaking your Pain Medication Contract").  *Opioid medications include: morphine, codeine, oxycodone, oxymorphone, hydrocodone, hydromorphone, meperidine, tramadol, tapentadol, buprenorphine, fentanyl, methadone. **Benzodiazepine medications include: diazepam (Valium), alprazolam (Xanax), clonazepam (Klonopine), lorazepam (Ativan), clorazepate (Tranxene), chlordiazepoxide (Librium), estazolam (Prosom), oxazepam (Serax), temazepam (Restoril), triazolam (Halcion) (Last updated: 06/10/2021) ____________________________________________________________________________________________  ____________________________________________________________________________________________  Medication Recommendations and Reminders  Applies to: All patients receiving prescriptions (written and/or electronic).  Medication Rules & Regulations: These rules and  regulations exist for your safety and that of others. They are not flexible and neither are we. Dismissing or ignoring them will be considered "non-compliance" with medication therapy, resulting in complete and irreversible termination of such therapy. (See document titled "Medication Rules" for more details.) In all conscience, because  of safety reasons, we cannot continue providing a therapy where the patient does not follow instructions.  Pharmacy of record:  Definition: This is the pharmacy where your electronic prescriptions will be sent.  We do not endorse any particular pharmacy, however, we have experienced problems with [facility] not securing enough medication supply for the community. We do not restrict you in your choice of pharmacy. However, once we write for your prescriptions, we will NOT be re-sending more prescriptions to fix restricted supply problems created by your pharmacy, or your insurance.  The pharmacy listed in the electronic medical record should be the one where you want electronic prescriptions to be sent. If you choose to change pharmacy, simply notify our nursing staff.  Recommendations: Keep all of your pain medications in a safe place, under lock and key, even if you live alone. We will NOT replace lost, stolen, or damaged medication. After you fill your prescription, take 1 week's worth of pills and put them away in a safe place. You should keep a separate, properly labeled bottle for this purpose. The remainder should be kept in the original bottle. Use this as your primary supply, until it runs out. Once it's gone, then you know that you have 1 week's worth of medicine, and it is time to come in for a prescription refill. If you do this correctly, it is unlikely that you will ever run out of medicine. To make sure that the above recommendation works, it is very important that you make sure your medication refill appointments are scheduled at least 1 week before you run  out of medicine. To do this in an effective manner, make sure that you do not leave the office without scheduling your next medication management appointment. Always ask the nursing staff to show you in your prescription , when your medication will be running out. Then arrange for the receptionist to get you a return appointment, at least 7 days before you run out of medicine. Do not wait until you have 1 or 2 pills left, to come in. This is very poor planning and does not take into consideration that we may need to cancel appointments due to bad weather, sickness, or emergencies affecting our staff. DO NOT ACCEPT A "Partial Fill": If for any reason your pharmacy does not have enough pills/tablets to completely fill or refill your prescription, do not allow for a "partial fill". The law allows the pharmacy to complete that prescription within 72 hours, without requiring a new prescription. If they do not fill the rest of your prescription within those 72 hours, you will need a separate prescription to fill the remaining amount, which we will NOT provide. If the reason for the partial fill is your insurance, you will need to talk to the pharmacist about payment alternatives for the remaining tablets, but again, DO NOT ACCEPT A PARTIAL FILL, unless you can trust your pharmacist to obtain the remainder of the pills within 72 hours.  Prescription refills and/or changes in medication(s):  Prescription refills, and/or changes in dose or medication, will be conducted only during scheduled medication management appointments. (Applies to both, written and electronic prescriptions.) No refills on procedure days. No medication will be changed or started on procedure days. No changes, adjustments, and/or refills will be conducted on a procedure day. Doing so will interfere with the diagnostic portion of the procedure. No phone refills. No medications will be "called into the pharmacy". No Fax refills. No weekend  refills. No Holliday refills.  No after hours refills.  Remember:  Business hours are:  Monday to Thursday 8:00 AM to 4:00 PM Provider's Schedule: Milinda Pointer, MD - Appointments are:  Medication management: Monday and Wednesday 8:00 AM to 4:00 PM Procedure day: Tuesday and Thursday 7:30 AM to 4:00 PM Gillis Santa, MD - Appointments are:  Medication management: Tuesday and Thursday 8:00 AM to 4:00 PM Procedure day: Monday and Wednesday 7:30 AM to 4:00 PM (Last update: 04/02/2020) ____________________________________________________________________________________________  ____________________________________________________________________________________________  CBD (cannabidiol) & Delta-8 (Delta-8 tetrahydrocannabinol) WARNING  Intro: Cannabidiol (CBD) and tetrahydrocannabinol (THC), are two natural compounds found in plants of the Cannabis genus. They can both be extracted from hemp or cannabis. Hemp and cannabis come from the Cannabis sativa plant. Both compounds interact with your body's endocannabinoid system, but they have very different effects. CBD does not produce the high sensation associated with cannabis. Delta-8 tetrahydrocannabinol, also known as delta-8 THC, is a psychoactive substance found in the Cannabis sativa plant, of which marijuana and hemp are two varieties. THC is responsible for the high associated with the illicit use of marijuana.  Applicable to: All individuals currently taking or considering taking CBD (cannabidiol) and, more important, all patients taking opioid analgesic controlled substances (pain medication). (Example: oxycodone; oxymorphone; hydrocodone; hydromorphone; morphine; methadone; tramadol; tapentadol; fentanyl; buprenorphine; butorphanol; dextromethorphan; meperidine; codeine; etc.)  Legal status: CBD remains a Schedule I drug prohibited for any use. CBD is illegal with one exception. In the [place], CBD has a limited Systems developer (FDA) approval for the treatment of two specific types of epilepsy disorders. Only one CBD product has been approved by the FDA for this purpose: "Epidiolex". FDA is aware that some companies are marketing products containing cannabis and cannabis-derived compounds in ways that violate the [employer], Drug and Cosmetic Act [facility] Act) and that may put the health and safety of consumers at risk. The FDA, a Federal agency, has not enforced the CBD status since 2018. UPDATE: (10/30/2021) The Drug Enforcement Agency ([place]) issued a letter stating that "delta" cannabinoids, including Delta-8-THCO and Delta-9-THCO, synthetically derived from hemp do not qualify as hemp and will be viewed as Schedule I drugs. (Schedule I drugs, substances, or chemicals are defined as drugs with no currently accepted medical use and a high potential for abuse. Some examples of Schedule I drugs are: heroin, lysergic acid diethylamide (LSD), marijuana (cannabis), 3,4-methylenedioxymethamphetamine (ecstasy), methaqualone, and peyote.) (https://jennings.com/)  Legality: Some manufacturers ship CBD products nationally, which is illegal. Often such products are sold online and are therefore available throughout the country. CBD is openly sold in head shops and health food stores in some states where such sales have not been explicitly legalized. Selling unapproved products with unsubstantiated therapeutic claims is not only a violation of the law, but also can put patients at risk, as these products have not been proven to be safe or effective. Federal illegality makes it difficult to conduct research on CBD.  Reference: "FDA Regulation of Cannabis and Cannabis-Derived Products, Including Cannabidiol (CBD)" - [employer].pt  Warning: CBD is not FDA approved and has not undergo the same manufacturing controls as  prescription drugs.  This means that the purity and safety of available CBD may be questionable. Most of the time, despite manufacturer's claims, it is contaminated with THC (delta-9-tetrahydrocannabinol - the chemical in marijuana responsible for the "HIGH").  When this is the case, the [place] contaminant will trigger a positive urine drug screen (UDS) test for Marijuana (carboxy-THC). Because a positive UDS for any  illicit substance is a violation of our medication agreement, your opioid analgesics (pain medicine) may be permanently discontinued. The FDA recently put out a warning about 5 things that everyone should be aware of regarding Delta-8 THC: Delta-8 THC products have not been evaluated or approved by the FDA for safe use and may be marketed in ways that put the public health at risk. The FDA has received adverse event reports involving delta-8 THC-containing products. Delta-8 THC has psychoactive and intoxicating effects. Delta-8 THC manufacturing often involve use of potentially harmful chemicals to create the concentrations of delta-8 THC claimed in the marketplace. The final delta-8 THC product may have potentially harmful by-products (contaminants) due to the chemicals used in the process. Manufacturing of delta-8 THC products may occur in uncontrolled or unsanitary settings, which may lead to the presence of unsafe contaminants or other potentially harmful substances. Delta-8 THC products should be kept out of the reach of children and pets.  MORE ABOUT CBD  General Information: CBD was discovered in [birth year] and it is a derivative of the cannabis sativa genus plants (Marijuana and Hemp). It is one of the 113 identified substances found in Marijuana. It accounts for up to 40% of the plant's extract. As of 2018, preliminary clinical studies on CBD included research for the treatment of anxiety, movement disorders, and pain. CBD is available and consumed in multiple forms, including inhalation of  smoke or vapor, as an aerosol spray, and by mouth. It may be supplied as an oil containing CBD, capsules, dried cannabis, or as a liquid solution. CBD is thought not to be as psychoactive as THC (delta-9-tetrahydrocannabinol - the chemical in marijuana responsible for the "HIGH"). Studies suggest that CBD may interact with different biological target receptors in the body, including cannabinoid and other neurotransmitter receptors. As of 2018 the mechanism of action for its biological effects has not been determined.  Side-effects  Adverse reactions: Dry mouth, diarrhea, decreased appetite, fatigue, drowsiness, malaise, weakness, sleep disturbances, and others.  Drug interactions: CBC may interact with other medications such as blood-thinners. Because CBD causes drowsiness on its own, it also increases the drowsiness caused by other medications, including antihistamines (such as Benadryl), benzodiazepines (Xanax, Ativan, Valium), antipsychotics, antidepressants and opioids, as well as alcohol and supplements such as kava, melatonin and St. John's Wort. Be cautious with the following combinations:   Brivaracetam (Briviact) Brivaracetam is changed and broken down by the body. CBD might decrease how quickly the body breaks down brivaracetam. This might increase levels of brivaracetam in the body.  Caffeine Caffeine is changed and broken down by the body. CBD might decrease how quickly the body breaks down caffeine. This might increase levels of caffeine in the body.  Carbamazepine (Tegretol) Carbamazepine is changed and broken down by the body. CBD might decrease how quickly the body breaks down carbamazepine. This might increase levels of carbamazepine in the body and increase its side effects.  Citalopram (Celexa) Citalopram is changed and broken down by the body. CBD might decrease how quickly the body breaks down citalopram. This might increase levels of citalopram in the body and increase its side  effects.  Clobazam (Onfi) Clobazam is changed and broken down by the liver. CBD might decrease how quickly the liver breaks down clobazam. This might increase the effects and side effects of clobazam.  Eslicarbazepine (Aptiom) Eslicarbazepine is changed and broken down by the body. CBD might decrease how quickly the body breaks down eslicarbazepine. This might increase levels of eslicarbazepine in the body  by a small amount.  Everolimus (Zostress) Everolimus is changed and broken down by the body. CBD might decrease how quickly the body breaks down everolimus. This might increase levels of everolimus in the body.  Lithium Taking higher doses of CBD might increase levels of lithium. This can increase the risk of lithium toxicity.  Medications changed by the liver (Cytochrome P450 1A1 (CYP1A1) substrates) Some medications are changed and broken down by the liver. CBD might change how quickly the liver breaks down these medications. This could change the effects and side effects of these medications.  Medications changed by the liver (Cytochrome P450 1A2 (CYP1A2) substrates) Some medications are changed and broken down by the liver. CBD might change how quickly the liver breaks down these medications. This could change the effects and side effects of these medications.  Medications changed by the liver (Cytochrome P450 1B1 (CYP1B1) substrates) Some medications are changed and broken down by the liver. CBD might change how quickly the liver breaks down these medications. This could change the effects and side effects of these medications.  Medications changed by the liver (Cytochrome P450 2A6 (CYP2A6) substrates) Some medications are changed and broken down by the liver. CBD might change how quickly the liver breaks down these medications. This could change the effects and side effects of these medications.  Medications changed by the liver (Cytochrome P450 2B6 (CYP2B6) substrates) Some  medications are changed and broken down by the liver. CBD might change how quickly the liver breaks down these medications. This could change the effects and side effects of these medications.  Medications changed by the liver (Cytochrome P450 2C19 (CYP2C19) substrates) Some medications are changed and broken down by the liver. CBD might change how quickly the liver breaks down these medications. This could change the effects and side effects of these medications.  Medications changed by the liver (Cytochrome P450 2C8 (CYP2C8) substrates) Some medications are changed and broken down by the liver. CBD might change how quickly the liver breaks down these medications. This could change the effects and side effects of these medications.  Medications changed by the liver (Cytochrome P450 2C9 (CYP2C9) substrates) Some medications are changed and broken down by the liver. CBD might change how quickly the liver breaks down these medications. This could change the effects and side effects of these medications.  Medications changed by the liver (Cytochrome P450 2D6 (CYP2D6) substrates) Some medications are changed and broken down by the liver. CBD might change how quickly the liver breaks down these medications. This could change the effects and side effects of these medications.  Medications changed by the liver (Cytochrome P450 2E1 (CYP2E1) substrates) Some medications are changed and broken down by the liver. CBD might change how quickly the liver breaks down these medications. This could change the effects and side effects of these medications.  Medications changed by the liver (Cytochrome P450 3A4 (CYP3A4) substrates) Some medications are changed and broken down by the liver. CBD might change how quickly the liver breaks down these medications. This could change the effects and side effects of these medications.  Medications changed by the liver (Glucuronidated drugs) Some medications are changed and  broken down by the liver. CBD might change how quickly the liver breaks down these medications. This could change the effects and side effects of these medications.  Medications that decrease the breakdown of other medications by the liver (Cytochrome P450 2C19 (CYP2C19) inhibitors) CBD is changed and broken down by the liver. Some drugs decrease how  quickly the liver changes and breaks down CBD. This could change the effects and side effects of CBD.  Medications that decrease the breakdown of other medications in the liver (Cytochrome P450 3A4 (CYP3A4) inhibitors) CBD is changed and broken down by the liver. Some drugs decrease how quickly the liver changes and breaks down CBD. This could change the effects and side effects of CBD.  Medications that increase breakdown of other medications by the liver (Cytochrome P450 3A4 (CYP3A4) inducers) CBD is changed and broken down by the liver. Some drugs increase how quickly the liver changes and breaks down CBD. This could change the effects and side effects of CBD.  Medications that increase the breakdown of other medications by the liver (Cytochrome P450 2C19 (CYP2C19) inducers) CBD is changed and broken down by the liver. Some drugs increase how quickly the liver changes and breaks down CBD. This could change the effects and side effects of CBD.  Methadone (Dolophine) Methadone is broken down by the liver. CBD might decrease how quickly the liver breaks down methadone. Taking cannabidiol along with methadone might increase the effects and side effects of methadone.  Rufinamide (Banzel) Rufinamide is changed and broken down by the body. CBD might decrease how quickly the body breaks down rufinamide. This might increase levels of rufinamide in the body by a small amount.  Sedative medications (CNS depressants) CBD might cause sleepiness and slowed breathing. Some medications, called sedatives, can also cause sleepiness and slowed breathing. Taking CBD  with sedative medications might cause breathing problems and/or too much sleepiness.  Sirolimus (Rapamune) Sirolimus is changed and broken down by the body. CBD might decrease how quickly the body breaks down sirolimus. This might increase levels of sirolimus in the body.  Stiripentol (Diacomit) Stiripentol is changed and broken down by the body. CBD might decrease how quickly the body breaks down stiripentol. This might increase levels of stiripentol in the body and increase its side effects.  Tacrolimus (Prograf) Tacrolimus is changed and broken down by the body. CBD might decrease how quickly the body breaks down tacrolimus. This might increase levels of tacrolimus in the body.  Tamoxifen (Soltamox) Tamoxifen is changed and broken down by the body. CBD might affect how quickly the body breaks down tamoxifen. This might affect levels of tamoxifen in the body.  Topiramate (Topamax) Topiramate is changed and broken down by the body. CBD might decrease how quickly the body breaks down topiramate. This might increase levels of topiramate in the body by a small amount.  Valproate Valproic acid can cause liver injury. Taking cannabidiol with valproic acid might increase the chance of liver injury. CBD and/or valproic acid might need to be stopped, or the dose might need to be reduced.  Warfarin (Coumadin) CBD might increase levels of warfarin, which can increase the risk for bleeding. CBD and/or warfarin might need to be stopped, or the dose might need to be reduced.  Zonisamide Zonisamide is changed and broken down by the body. CBD might decrease how quickly the body breaks down zonisamide. This might increase levels of zonisamide in the body by a small amount. (Last update: 11/11/2021) ____________________________________________________________________________________________  ____________________________________________________________________________________________  Drug Holidays  (Slow)  What is a "Drug Holiday"? Drug Holiday: is the name given to the period of time during which a patient stops taking a medication(s) for the purpose of eliminating tolerance to the drug.  Benefits Improved effectiveness of opioids. Decreased opioid dose needed to achieve benefits. Improved pain with lesser dose.  What is  tolerance? Tolerance: is the progressive decreased in effectiveness of a drug due to its repetitive use. With repetitive use, the body gets use to the medication and as a consequence, it loses its effectiveness. This is a common problem seen with opioid pain medications. As a result, a larger dose of the drug is needed to achieve the same effect that used to be obtained with a smaller dose.  How long should a "Drug Holiday" last? You should stay off of the pain medicine for at least 14 consecutive days. (2 weeks)  Should I stop the medicine "cold [place]"? No. You should always coordinate with your Pain Specialist so that he/she can provide you with the correct medication dose to make the transition as smoothly as possible.  How do I stop the medicine? Slowly. You will be instructed to decrease the daily amount of pills that you take by one (1) pill every seven (7) days. This is called a "slow downward taper" of your dose. For example: if you normally take four (4) pills per day, you will be asked to drop this dose to three (3) pills per day for seven (7) days, then to two (2) pills per day for seven (7) days, then to one (1) per day for seven (7) days, and at the end of those last seven (7) days, this is when the "Drug Holiday" would start.   Will I have withdrawals? By doing a "slow downward taper" like this one, it is unlikely that you will experience any significant withdrawal symptoms. Typically, what triggers withdrawals is the sudden stop of a high dose opioid therapy. Withdrawals can usually be avoided by slowly decreasing the dose over a prolonged period of  time. If you do not follow these instructions and decide to stop your medication abruptly, withdrawals may be possible.  What are withdrawals? Withdrawals: refers to the wide range of symptoms that occur after stopping or dramatically reducing opiate drugs after heavy and prolonged use. Withdrawal symptoms do not occur to patients that use low dose opioids, or those who take the medication sporadically. Contrary to benzodiazepine (example: Valium, Xanax, etc.) or alcohol withdrawals ("Delirium Tremens"), opioid withdrawals are not lethal. Withdrawals are the physical manifestation of the body getting rid of the excess receptors.  Expected Symptoms Early symptoms of withdrawal may include: Agitation Anxiety Muscle aches Increased tearing Insomnia Runny nose Sweating Yawning  Late symptoms of withdrawal may include: Abdominal cramping Diarrhea Dilated pupils Goose bumps Nausea Vomiting  Will I experience withdrawals? Due to the slow nature of the taper, it is very unlikely that you will experience any.  What is a slow taper? Taper: refers to the gradual decrease in dose.  (Last update: 04/02/2020) ____________________________________________________________________________________________   ____________________________________________________________________________________________  Pharmacy Shortages of Pain Medication   Introduction Shockingly as it may seem, .  "No [place] Supreme Court decision has ever interpreted the Constitution as guaranteeing a right to health care for all Americans." - https://huff.com/  "With respect to human rights, the [place] has no formally codified right to health, nor does it participate in a human rights treaty that specifies a right to health." - Scott J. Schweikart, JD, MBE  Situation By now, most of our patients have had the experience of being told by their pharmacist  that they do not have enough medication to cover their prescription. If you have not had this experience, just know that you soon will.  Problem There appears to be a shortage of these medications, either at the  national level or locally. This is happening with all pharmacies. When there is not enough medication, patients are offered a partial fill and they are told that they will try to get the rest of the medicine for them at a later time. If they do not have enough for even a partial fill, the pharmacists are telling the patients to call us (the prescribing physicians) to request that we send another prescription to another pharmacy to get the medicine.   This reordering of a controlled substance creates documentation problems where additional paperwork needs to be created to explain why two prescriptions for the same period of time and the same medicine are being prescribed to the same patient. It also creates situations where the last appointment note does not accurately reflect when and what prescriptions were given to a patient. This leads to prescribing errors down the line, in subsequent follow-up visits.   [employer] of Pharmacy ([employer]) Research revealed that Surveyor, quantity .1806 (21 NCAC 46.1806) authorizes pharmacists to the transfer of prescriptions among pharmacies, and it sets forth procedural and recordkeeping requirements for doing so. However, this requires the pharmacist to complete the previously mentioned procedural paperwork to accomplish the transfer. As it turns out, it is much easier for them to have the prescribing physicians do the work.   Possible solutions 1. Have the [facility] [employer] add a provision to the "STOP ACT" (the law that mandates how controlled substances are prescribed) where there is an exception to the electronic prescribing rule that states that in the event there are shortages of medications the physicians are allowed to use  written prescriptions as opposed to electronic ones. This would allow patients to take their prescriptions to a different pharmacy that may have enough medication available to fill the prescription. The problem is that currently there is a law that does not allow for written prescriptions, with the exception of instances where the electronic medical record is down due to technical issues.  2. Have [place] Congress ease the pressure on pharmaceutical companies, allowing them to produce enough quantities of the medication to adequately supply the population. 3. Have pharmacies keep enough stocks of these medications to cover their client base.  4. Have the [facility] Assembly add a provision to the "STOP ACT" where they ease the regulations surrounding the transfer of controlled substances between pharmacies, so as to simplify the transfer of supplies. As an alternative, develop a system to allow patients to obtain the remainder of their prescription at another one of their pharmacies or at an associate pharmacy.   How this shortage will affect you.  The one thing that is abundantly clear is that this is a pharmacy supply problem  and not a prescriber problem. The job of the prescriber is to evaluate and monitor the patients for the appropriate indications to the use of these medicines, the monitoring of their use and the prescribing of the appropriate dose and regimen. It is not the job of the prescriber to provide or dispense the actual medication. By law, this is the job of the pharmacies and pharmacists. It is certainly not the job of the prescriber to solve the supply problems.   Due to the above problems we are no longer taking patients to write for their pain medication. We will continue to evaluate for appropriate indications and we may provide recommendations regarding medication, dose, and schedule, as well as monitoring recommendations, however, we will not be taking over  the actual prescribing  of these substances. On those patients where we are treating their chronic pain with interventional therapies, exceptions will be considered on a case by case basis. At this time, we will try to continue providing this supplemental service to those patients we have been managing in the past. However, as of August 1st, 2023, we no longer will be sending additional prescriptions to other pharmacies for the purpose of solving their supply problems. Once we send a prescription to a pharmacy, we will not be resending it again to another pharmacy to cover for their shortages.   What to do. Write as many letters as you can. Recruit the help of family members in writing these letters. Below are some of the places where you can write to make your voice heard. Let them know what the problem is and push them to look for solutions.   Search internet for: "[employer] find your legislators" NoseSwap.is  Search internet for: "[employer] commissioner complaints" Starlas.fi  Search internet for: "[place] complaints" https://www.hernandez-brewer.com/.htm  Search internet for: "[facility] pharmacy complaints" Email [facility] Customer [facility] woondaal.com.jsp?callType=store  Search internet for: Programme researcher, broadcasting/film/video customer service complaints" https://www.[facility].com/topic/marketing/contactus/contactus_customerservice.jsp  ____________________________________________________________________________________________

## 2022-03-15 NOTE — Progress Notes (Signed)
Nursing Pain Medication Assessment:  Safety precautions to be maintained throughout the outpatient stay will include: orient to surroundings, keep bed in low position, maintain call bell within reach at all times, provide assistance with transfer out of bed and ambulation.  Medication Inspection Compliance: Pill count conducted under aseptic conditions, in front of the patient. Neither the pills nor the bottle was removed from the patient's sight at any time. Once count was completed pills were immediately returned to the patient in their original bottle.  Medication: Oxycodone ER (OxyContin) Pill/Patch Count:  49 of 90 pills remain Pill/Patch Appearance: Markings consistent with prescribed medication Bottle Appearance: Standard pharmacy container. Clearly labeled. Filled Date: 06 / 19 / 2023 Last Medication intake:  Today

## 2022-03-18 ENCOUNTER — Telehealth: Payer: Self-pay | Admitting: Pain Medicine

## 2022-03-18 NOTE — Telephone Encounter (Signed)
I do not see that Dr. Dossie Arbour has given pt Oxycodone ER. Pt informed.

## 2022-03-18 NOTE — Telephone Encounter (Signed)
Patient is calling about the med list in his chart. Says he has never taken Oxycontin Extended release. He got confused on this and said yes. Please call him regarding this matter. He needs this medicine taken out.

## 2022-03-21 LAB — TOXASSURE SELECT 13 (MW), URINE

## 2022-03-22 ENCOUNTER — Telehealth: Payer: Self-pay | Admitting: Pain Medicine

## 2022-03-22 NOTE — Telephone Encounter (Signed)
Pt asking if he should take Oxycodone and Morphine ER. According to Dr. Adalberto Cole last not, Oxycodone is being replaced with Morphine ER. Patient notified of this. Also PA submitted.

## 2022-03-22 NOTE — Telephone Encounter (Signed)
Patient states his morphine needs PA. He also has some questions regarding current medication. Wants to speak with a nurse.

## 2022-03-23 ENCOUNTER — Telehealth: Payer: Self-pay | Admitting: Pain Medicine

## 2022-03-23 NOTE — Telephone Encounter (Signed)
Spoke with Dr Dossie Arbour about patients concern in taking Morphine every 12 hours and the last dose being at 6pm.  Dr Dossie Arbour states that it would be fine to continue taking the Morphine.  Patient notified.

## 2022-03-23 NOTE — Telephone Encounter (Signed)
Wants to speak with a nurse about medications he picked up yesterday

## 2022-03-23 NOTE — Telephone Encounter (Signed)
Patient states that he is concerned to take the Morphine because Dr Dossie Arbour told him not to take the oxycodone  after 3pm due to sleep apnea and the insert says not to take the MOrphine if you have sleep apnea.  Will discuss with Dr Dossie Arbour.

## 2022-03-23 NOTE — Telephone Encounter (Signed)
Attempted to call patient about medication questions.  Mailbox is full and I was unable to leave a message.

## 2022-03-23 NOTE — Telephone Encounter (Signed)
Patient returned call / please try to call him back

## 2022-04-19 ENCOUNTER — Telehealth: Payer: Self-pay | Admitting: Pain Medicine

## 2022-04-19 NOTE — Telephone Encounter (Signed)
He needs to come in for an appointment to discuss medication changes.

## 2022-04-19 NOTE — Telephone Encounter (Signed)
Patient stated that the meds that he is currently taking isn't working. Pain is increase. Patient wants to know is doctor can get an refill on oxycodone. Patient did mention that he was taking oxycodone doctor has stop it. Pt also said that the gabapentin isn't helping. Please give patient a call. Thanks

## 2022-04-20 NOTE — Progress Notes (Signed)
PROVIDER NOTE: Information contained herein reflects review and annotations entered in association with encounter. Interpretation of such information and data should be left to medically-trained personnel. Information provided to patient can be located elsewhere in the medical record under "Patient Instructions". Document created using STT-dictation technology, any transcriptional errors that may result from process are unintentional.    Patient: Ricky Mercer  Service Category: E/M  Provider: Gaspar Cola, MD  DOB: 01-11-1951  DOS: 04/21/2022  Referring Provider: Sofie Hartigan, MD  MRN: [id number]  Specialty: Interventional Pain Management  PCP: Sofie Hartigan, MD  Type: Established Patient  Setting: Ambulatory outpatient    Location: Office  Delivery: Face-to-face     HPI  [name], a 71 y.o. year old male, is here today because of his Chronic pain syndrome [G89.4]. Mr. Cuda primary complain today is Foot Pain (both) Last encounter: My last encounter with him was on 04/19/2022. Pertinent problems: Mr. Mcnease has Hand weakness; Polyradiculopathy; Chronic low back pain (1ry area of Pain) (Bilateral) (L>R); Chronic lower extremity pain (3ry area of Pain) (Bilateral) (L>R); Chronic lumbar radicular pain (Bilateral) (L>R) (Bilateral S1 dermatomal distribution); Chronic hip pain (2ry area of Pain) (Bilateral) (L>R); Chronic upper back pain (Bilateral) (L>R); Chronic neck pain (neck<shoulder) (L>R); Chronic knee pain (Right); Cervical spondylosis; 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); Lumbar spondylosis; Cervical facet hypertrophy; Cervical facet syndrome (R>L); Radicular pain of shoulder; Peripheral neuropathy (HCC) (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); Lumbar facet syndrome (Bilateral) (L>R); Chronic shoulder pain (Bilateral) (L>R); Chronic sacroiliac joint pain (Bilateral) (L>R); Chronic musculoskeletal pain; Chronic foot pain (Bilateral);  Neurogenic pain; Chronic pain syndrome; Bilateral arm pain; DDD (degenerative disc disease), cervical; DDD (degenerative disc disease), lumbosacral; Cervicogenic headache; Cervico-occipital neuralgia; Osteoarthritis of spine with radiculopathy, lumbar region; Decreased sensation; Numbness and tingling; Weakness of left leg; Bilateral leg and foot pain; Cervicalgia; Chronic upper extremity pain (Bilateral) (L>R); Abnormal MRI, cervical spine (12/07/2019); Chronic burning sensation of feet (Bilateral); Numbness and tingling of both legs; Spondylosis without myelopathy or radiculopathy, cervical region; Osteoarthritis involving multiple joints; Chronic mid back pain; Total body pain; Idiopathic small fiber sensory neuropathy; Fibromyalgia; and Small fiber neuropathy on their pertinent problem list. Pain Assessment: Severity of Chronic pain is reported as a 7 /10. Location: Foot Left, Right/pain radiaties everywhere. Onset: More than a month ago. Quality: Aching, Burning, Throbbing, Tingling, Constant. Timing: Constant. Modifying factor(s): Meds and rest. Vitals:  height is 5' 9"  (1.753 m) and weight is 165 lb (74.8 kg). His temperature is 98.2 F (36.8 C). His blood pressure is 123/76 and his pulse is 61. His oxygen saturation is 100%.   Reason for encounter: medication management.  The patient returns today indicating that the MS Contin 15 mg p.o. twice daily (30 MME) did not help his pain as well as his prior oxycodone IR 5 mg, 1 tab p.o. every 8 hours (22.5 MME).  He wants me to change him back to what he had before.  For some reason on 03/15/2022 the patient had 3 prescriptions written for MS Contin 15 mg twice daily.  According to the PMP, on 03/22/2022 he had the first prescription filled, but there are still 2 other prescriptions that have not been filled.  His next prescription should be filled on 04/22/2022 to last until 05/22/2022 at which time the third prescription should be filled to last until  06/21/2022.  Previously he was on oxycodone IR 5 mg, 1 tab p.o. every 8 hours (22.5  MME) and currently he is on the morphine ER 15 mg p.o. twice daily (30 MME).  Very long conversation regarding the reason why the opioids and not helping his neuropathic pain.  I have had this conversation with the patient multiple times and unfortunately he will spend his entire visit going back and forth with regards to the medications, not allowing me to concentrate on trying to come up with interventional options that may be a lot more effective in controlling this pain.  Today I have informed the patient that this will be the last time that I will be switching or changing his medications and that from now on we will be concentrating on the pain itself rather than the therapy.  RTCB: 07/20/2022 Nonopioids transferred 06/25/2020: Vitamin D3, Neurontin, and Mobic.  Pharmacotherapy Assessment  Analgesic: Oxycodone 5 mg, 1 tab PO q 8 hrs (15 mg/day of oxycodone); switch to MS Contin 15 mg p.o. twice daily (30 mg/day of morphine) on 03/15/2022. MME/day: 22.5 mg/day increased on 03/15/2022 to 30 mg/day.   Monitoring: [place] PMP: PDMP reviewed during this encounter.       Pharmacotherapy: No side-effects or adverse reactions reported. Compliance: No problems identified. Effectiveness: Clinically acceptable.  Landis Martins, RN  04/21/2022  3:17 PM  Sign when Signing Visit Morphine 29 1/2 tabs taken to pharmacy in a sealed container, wasted into medication waste bin, witnessed by patient.  Chauncey Fischer, RN  04/21/2022  1:53 PM  Sign when Signing Visit Nursing Pain Medication Assessment:  Safety precautions to be maintained throughout the outpatient stay will include: orient to surroundings, keep bed in low position, maintain call bell within reach at all times, provide assistance with transfer out of bed and ambulation.  Nursing Pain Medication Assessment:  Safety precautions to be maintained throughout the outpatient  stay will include: orient to surroundings, keep bed in low position, maintain call bell within reach at all times, provide assistance with transfer out of bed and ambulation.  Medication Inspection Compliance: Pill count conducted under aseptic conditions, in front of the patient. Neither the pills nor the bottle was removed from the patient's sight at any time. Once count was completed pills were immediately returned to the patient in their original bottle.  Medication #1: Oxycodone IR Pill/Patch Count: 0 out of 90 Pill/Patch Appearance: Markings consistent with prescribed medication Bottle Appearance: Standard pharmacy container. Clearly labeled. Filled Date: 6 / 62 / 2023 Last Medication intake:  Ran out of medicine more than 48 hours ago  Medication #2: Morphine ER (MSContin) Pill/Patch Count:  29 1/2 of 60 pills remain Pill/Patch Appearance: Markings consistent with prescribed medication Bottle Appearance: Standard pharmacy container. Clearly labeled. Filled Date: 7 / 10 / 2023 Last Medication intake:  Today    UDS:  Summary  Date Value Ref Range Status  03/15/2022 Note  Final    Comment:    ==================================================================== [employer] [facility] (MW) ==================================================================== Test                             Result       Flag       Units  Drug Present not Declared for Prescription Verification   Oxycodone                      426          UNEXPECTED ng/mg creat   Oxymorphone  3517         UNEXPECTED ng/mg creat   Noroxycodone                   905          UNEXPECTED ng/mg creat   Noroxymorphone                 793          UNEXPECTED ng/mg creat    Sources of oxycodone are scheduled prescription medications.    Oxymorphone, noroxycodone, and noroxymorphone are expected    metabolites of oxycodone. Oxymorphone is also available as a    scheduled prescription medication.  Drug Absent  but Declared for Prescription Verification   Morphine                       Not Detected UNEXPECTED ng/mg creat ==================================================================== Test                      Result    Flag   Units      Ref Range   Creatinine              174              mg/dL      >=20 ==================================================================== Declared Medications:  The flagging and interpretation on this report are based on the  following declared medications.  Unexpected results may arise from  inaccuracies in the declared medications.   **Note: The testing scope of this panel includes these medications:   Morphine (MS Contin)   **Note: The testing scope of this panel does not include the  following reported medications:   Aspirin  Calcium  Cholecalciferol  Gabapentin (Neurontin)  Pantoprazole (Protonix)  Simvastatin (Zocor) ==================================================================== For clinical consultation, please call [phone]. ====================================================================      ROS  Constitutional: Denies any fever or chills Gastrointestinal: No reported hemesis, hematochezia, vomiting, or acute GI distress Musculoskeletal: Denies any acute onset joint swelling, redness, loss of ROM, or weakness Neurological: No reported episodes of acute onset apraxia, aphasia, dysarthria, agnosia, amnesia, paralysis, loss of coordination, or loss of consciousness  Medication Review  aspirin, calcium-vitamin D, gabapentin, oxyCODONE, pantoprazole, and simvastatin  History Review  Allergy: Mr. Nevills has No Known Allergies. Drug: Mr. Guthmiller  reports no history of drug use. Alcohol:  reports no history of alcohol use. Tobacco:  reports that he has never smoked. He has never used smokeless tobacco. Social: Mr. Mcpartlin  reports that he has never smoked. He has never used smokeless tobacco. He reports that he does not  drink alcohol and does not use drugs. Medical:  has a past medical history of Acute postoperative pain (03/21/2017), Anginal pain ([place]), Bilateral arm pain, Cardiomyopathy ([place]), Cardiomyopathy, idiopathic ([place]), Chronic back pain, Chronic pain syndrome, Depression, Diverticulosis, Dupuytren's contracture, Erosive gastritis, Fibromyalgia, Foot pain, bilateral, GERD (gastroesophageal reflux disease), Hand weakness, Hyperlipidemia, Knee pain, Midline low back pain, Neuromuscular disorder (HCC), Opioid dependence ([place]), Osteoarthritis, Pain in neck, Palpitations, Prostate cancer ([place]), Reactive airway disease, Seasonal allergies, Situational anxiety, Skin cancer, Sleep apnea, and Tubular adenoma of colon. Surgical: Mr. Showers  has a past surgical history that includes Nose surgery; Hand surgery (Bilateral); Colonoscopy (10/24/2012); Upper gi endoscopy (12/18/2012); Tonsillectomy (2009); Skin cancer excision (Left, 2015); Mass excision (Right, 07/18/2015); Esophagogastroduodenoscopy (egd) with propofol (N/A, 10/28/2015); prostate seeding; Hernia repair; Cholecystectomy (N/A, 12/02/2015); Esophagogastroduodenoscopy (N/A, 11/17/2017); Colonoscopy with propofol (N/A, 11/17/2017); Esophagogastroduodenoscopy (N/A, 03/24/2020); and  Colonoscopy with propofol (N/A, 11/03/2020). Family: family history includes Colon cancer in his brother; Hyperlipidemia in his sister; Lung cancer in his father; Prostate cancer in his brother.  Laboratory Chemistry Profile   Renal Lab Results  Component Value Date   BUN 11 08/04/2020   CREATININE 0.99 08/04/2020   BCR 12 08/06/2019   GFRAA 99 08/06/2019   GFRNONAA >60 08/04/2020    Hepatic Lab Results  Component Value Date   AST 20 08/04/2020   ALT 15 08/04/2020   ALBUMIN 3.8 08/04/2020   ALKPHOS 47 08/04/2020   LIPASE 24 08/04/2020    Electrolytes Lab Results  Component Value Date   NA 139 08/04/2020   K 4.3 08/04/2020   CL 102 08/04/2020   CALCIUM 8.8 (L) 08/04/2020   MG 2.0  08/06/2019    Bone Lab Results  Component Value Date   25OHVITD1 49 08/06/2019   25OHVITD2 <1.0 08/06/2019   25OHVITD3 49 08/06/2019    Inflammation (CRP: Acute Phase) (ESR: Chronic Phase) Lab Results  Component Value Date   CRP <1 08/06/2019   ESRSEDRATE 2 08/06/2019         Note: Above Lab results reviewed.  Recent Imaging Review  CT CHEST WO CONTRAST CLINICAL DATA:  Right lower chest pain for 5 months, increasing, no known injury  EXAM: CT CHEST WITHOUT CONTRAST  TECHNIQUE: Multidetector CT imaging of the chest was performed following the standard protocol without IV contrast.  COMPARISON:  11/15/2013  FINDINGS: Cardiovascular: No significant vascular findings. Normal heart size. No pericardial effusion.  Mediastinum/Nodes: No enlarged mediastinal, hilar, or axillary lymph nodes. Thyroid gland, trachea, and esophagus demonstrate no significant findings.  Lungs/Pleura: There is a small, definitively benign calcified nodule of the right upper lobe (series 3, image 40). There are occasional tiny noncalcified nodules, for example a 3 mm nodule of the lateral segment right middle lobe, stable compared to prior examination dated 2015 and definitively benign (series 3, image 96). No pleural effusion or pneumothorax.  Upper Abdomen: No acute abnormality.  Musculoskeletal: No chest wall mass or suspicious bone lesions identified.  IMPRESSION: 1. No non-contrast CT findings of the chest to explain right-sided chest pain.  2. Occasional tiny, stable, and definitively benign small pulmonary nodules for which no further follow-up is required.  Electronically Signed   By: Eddie Candle M.D.   On: 09/17/2020 13:26 Note: Reviewed        Physical Exam  General appearance: Well nourished, well developed, and well hydrated. In no apparent acute distress Mental status: Alert, oriented x 3 (person, place, & time)       Respiratory: No evidence of acute respiratory  distress Eyes: PERLA Vitals: BP 123/76   Pulse 61   Temp 98.2 F (36.8 C)   Ht 5' 9"  (1.753 m)   Wt 165 lb (74.8 kg)   SpO2 100%   BMI 24.37 kg/m  BMI: Estimated body mass index is 24.37 kg/m as calculated from the following:   Height as of this encounter: 5' 9"  (1.753 m).   Weight as of this encounter: 165 lb (74.8 kg). Ideal: Ideal body weight: 70.7 kg (155 lb 13.8 oz) Adjusted ideal body weight: 72.4 kg (159 lb 8.3 oz)  Assessment   Diagnosis Status  1. Chronic pain syndrome   2. Chronic low back pain (1ry area of Pain) (Bilateral) (L>R)   3. Chronic hip pain (2ry area of Pain) (Bilateral) (L>R)   4. Chronic lower extremity pain (3ry area of Pain) (Bilateral) (L>R)  5. Cervicalgia   6. DDD (degenerative disc disease), lumbosacral   7. DDD (degenerative disc disease), cervical   8. Pharmacologic therapy   9. Chronic use of opiate for therapeutic purpose   10. Encounter for medication management   11. Encounter for chronic pain management    Controlled Controlled Controlled   Updated Problems: No problems updated.  Plan of Care  Problem-specific:  No problem-specific Assessment & Plan notes found for this encounter.  Mr. KIRE FERG has a current medication list which includes the following long-term medication(s): calcium-vitamin d, gabapentin, oxycodone, [START ON 05/21/2022] oxycodone, [START ON 06/20/2022] oxycodone, and simvastatin.  Pharmacotherapy (Medications Ordered): Meds ordered this encounter  Medications   oxyCODONE (OXY IR/ROXICODONE) 5 MG immediate release tablet    Sig: Take 1 tablet (5 mg total) by mouth every 8 (eight) hours as needed for severe pain. Must last 30 days    Dispense:  90 tablet    Refill:  0    DO NOT: delete (not duplicate); no partial-fill (will deny script to complete), no refill request (F/U required). DISPENSE: 1 day early if closed on fill date. WARN: No CNS-depressants within 8 hrs of med.   oxyCODONE (OXY IR/ROXICODONE)  5 MG immediate release tablet    Sig: Take 1 tablet (5 mg total) by mouth every 8 (eight) hours as needed for severe pain. Must last 30 days    Dispense:  90 tablet    Refill:  0    DO NOT: delete (not duplicate); no partial-fill (will deny script to complete), no refill request (F/U required). DISPENSE: 1 day early if closed on fill date. WARN: No CNS-depressants within 8 hrs of med.   oxyCODONE (OXY IR/ROXICODONE) 5 MG immediate release tablet    Sig: Take 1 tablet (5 mg total) by mouth every 8 (eight) hours as needed for severe pain. Must last 30 days    Dispense:  90 tablet    Refill:  0    DO NOT: delete (not duplicate); no partial-fill (will deny script to complete), no refill request (F/U required). DISPENSE: 1 day early if closed on fill date. WARN: No CNS-depressants within 8 hrs of med.   Orders:  No orders of the defined types were placed in this encounter.  Follow-up plan:   Return in about 2 weeks (around 05/05/2022) for Eval-day (M,W), (F2F), for PExm.     Interventional Therapies  Risk  Complexity Considerations:   Estimated body mass index is 25.1 kg/m as calculated from the following:   Height as of this encounter: 5' 9"  (1.753 m).   Weight as of this encounter: 170 lb (77.1 kg). WNL   Planned  Pending:      Under consideration:      Completed:   Palliative bilateral cervical facet block x1  Palliative right lumbar facet block x3  Palliative left lumbar facet block x3  Palliative right SI joint block x2  Palliative left SI joint block x2  Palliative left lumbar facet RFA (last done on 03/21/2017)  Palliative left SI joint RFA (last done on 03/21/2017)  Therapeutic/palliative left CESI x3  Palliative left cervical facet RFA x1 (last done 03/11/2020)   Therapeutic  Palliative (PRN) options:   Palliative bilateral cervical facet block #2  Palliative right lumbar facet block #4  Palliative left lumbar facet block #4  Palliative right SI joint block #3   Palliative left SI joint block #3  Palliative left lumbar facet RFA (last done on 03/21/2017)  Palliative  left SI joint RFA (last done on 03/21/2017)  Therapeutic/palliative left CESI #4  Palliative left cervical facet RFA #2 (last done 03/11/2020)    Recent Visits Date Type Provider Dept  04/21/22 Office Visit Milinda Pointer, [facility]  03/15/22 Office Visit Milinda Pointer, MD [facility]  Showing recent visits within past 90 days and meeting all other requirements Future Appointments Date Type Provider Dept  07/21/22 Appointment Milinda Pointer, MD [facility]  Showing future appointments within next 90 days and meeting all other requirements  I discussed the assessment and treatment plan with the patient. The patient was provided an opportunity to ask questions and all were answered. The patient agreed with the plan and demonstrated an understanding of the instructions.  Patient advised to call back or seek an in-person evaluation if the symptoms or condition worsens.  Duration of encounter: 45 minutes.  Total time on encounter, as per AMA guidelines included both the face-to-face and non-face-to-face time personally spent by the physician and/or other qualified health care professional(s) on the day of the encounter (includes time in activities that require the physician or other qualified health care professional and does not include time in activities normally performed by clinical staff). Physician's time may include the following activities when performed: preparing to see the patient (eg, review of tests, pre-charting review of records) obtaining and/or reviewing separately obtained history performing a medically appropriate examination and/or evaluation counseling and educating the patient/family/caregiver ordering medications, tests, or procedures referring and communicating with other health care professionals (when not separately  reported) documenting clinical information in the electronic or other health record independently interpreting results (not separately reported) and communicating results to the patient/ family/caregiver care coordination (not separately reported)  Note by: Gaspar Cola, MD Date: 04/21/2022; Time: 12:55 PM

## 2022-04-20 NOTE — Patient Instructions (Signed)
____________________________________________________________________________________________  Pharmacy Shortages of Pain Medication   Introduction Shockingly as it may seem, .  "No U.[place] Supreme Court decision has ever interpreted the Constitution as guaranteeing a right to health care for all Americans." - https://www.healthequityandpolicylab.com/elusive-right-to-health-care-under-us-law  "With respect to human rights, the [place] has no formally codified right to health, nor does it participate in a human rights treaty that specifies a right to health." - Scott J. Schweikart, JD, MBE  Situation By now, most of our patients have had the experience of being told by their pharmacist that they do not have enough medication to cover their prescription. If you have not had this experience, just know that you soon will.  Problem There appears to be a shortage of these medications, either at the national level or locally. This is happening with all pharmacies. When there is not enough medication, patients are offered a partial fill and they are told that they will try to get the rest of the medicine for them at a later time. If they do not have enough for even a partial fill, the pharmacists are telling the patients to call us (the prescribing physicians) to request that we send another prescription to another pharmacy to get the medicine.   This reordering of a controlled substance creates documentation problems where additional paperwork needs to be created to explain why two prescriptions for the same period of time and the same medicine are being prescribed to the same patient. It also creates situations where the last appointment note does not accurately reflect when and what prescriptions were given to a patient. This leads to prescribing errors down the line, in subsequent follow-up visits.   [place] Board of Pharmacy ([place]) Research revealed that Board of Pharmacy Rule .1806 (21  [place]AC 46.1806) authorizes pharmacists to the transfer of prescriptions among pharmacies, and it sets forth procedural and recordkeeping requirements for doing so. However, this requires the pharmacist to complete the previously mentioned procedural paperwork to accomplish the transfer. As it turns out, it is much easier for them to have the prescribing physicians do the work.   Possible solutions 1. Have the [place] Assembly add a provision to the "STOP ACT" (the law that mandates how controlled substances are prescribed) where there is an exception to the electronic prescribing rule that states that in the event there are shortages of medications the physicians are allowed to use written prescriptions as opposed to electronic ones. This would allow patients to take their prescriptions to a different pharmacy that may have enough medication available to fill the prescription. The problem is that currently there is a law that does not allow for written prescriptions, with the exception of instances where the electronic medical record is down due to technical issues.  2. Have [place] Congress ease the pressure on pharmaceutical companies, allowing them to produce enough quantities of the medication to adequately supply the population. 3. Have pharmacies keep enough stocks of these medications to cover their client base.  4. Have the [place] Assembly add a provision to the "STOP ACT" where they ease the regulations surrounding the transfer of controlled substances between pharmacies, so as to simplify the transfer of supplies. As an alternative, develop a system to allow patients to obtain the remainder of their prescription at another one of their pharmacies or at an associate pharmacy.   How this shortage will affect you.  The one thing that is abundantly clear is that this is a pharmacy supply   problem  and not a prescriber problem. The job of the prescriber is to evaluate and monitor the  patients for the appropriate indications to the use of these medicines, the monitoring of their use and the prescribing of the appropriate dose and regimen. It is not the job of the prescriber to provide or dispense the actual medication. By law, this is the job of the pharmacies and pharmacists. It is certainly not the job of the prescriber to solve the supply problems.   Due to the above problems we are no longer taking patients to write for their pain medication. We will continue to evaluate for appropriate indications and we may provide recommendations regarding medication, dose, and schedule, as well as monitoring recommendations, however, we will not be taking over the actual prescribing of these substances. On those patients where we are treating their chronic pain with interventional therapies, exceptions will be considered on a case by case basis. At this time, we will try to continue providing this supplemental service to those patients we have been managing in the past. However, as of August 1st, 2023, we no longer will be sending additional prescriptions to other pharmacies for the purpose of solving their supply problems. Once we send a prescription to a pharmacy, we will not be resending it again to another pharmacy to cover for their shortages.   What to do. Write as many letters as you can. Recruit the help of family members in writing these letters. Below are some of the places where you can write to make your voice heard. Let them know what the problem is and push them to look for solutions.   Search internet for: "[place] find your legislators" https://www.ncleg.gov/findyourlegislators  Search internet for: "[place] insurance commissioner complaints" https://www.ncdoi.gov/contactscomplaints/assistance-or-file-complaint  Search internet for: "[place] Board of Pharmacy complaints" http://www.ncbop.org/contact.htm  Search internet for: "[facility] pharmacy  complaints" Email [facility] Customer Relations https://www.[facility].com/help/email-customer-relations.jsp?callType=store  Search internet for: "[facility] pharmacy customer service complaints" https://www.[facility].com/topic/marketing/contactus/contactus_customerservice.jsp  ____________________________________________________________________________________________  ____________________________________________________________________________________________  Medication Rules  Purpose: To inform patients, and their family members, of our rules and regulations.  Applies to: All patients receiving prescriptions (written or electronic).  Pharmacy of record: Pharmacy where electronic prescriptions will be sent. If written prescriptions are taken to a different pharmacy, please inform the nursing staff. The pharmacy listed in the electronic medical record should be the one where you would like electronic prescriptions to be sent.  Electronic prescriptions: In compliance with the [place] Strengthen Opioid Misuse Prevention (STOP) Act of 2017 (Session Law 2017-74/H243), effective September 13, 2018, all controlled substances must be electronically prescribed. Calling prescriptions to the pharmacy will cease to exist.  Prescription refills: Only during scheduled appointments. Applies to all prescriptions.  NOTE: The following applies primarily to controlled substances (Opioid* Pain Medications).   Type of encounter (visit): For patients receiving controlled substances, face-to-face visits are required. (Not an option or up to the patient.)  Patient's responsibilities: Pain Pills: Bring all pain pills to every appointment (except for procedure appointments). Pill Bottles: Bring pills in original pharmacy bottle. Always bring the newest bottle. Bring bottle, even if empty. Medication refills: You are responsible for knowing and keeping track of what medications you take and those you need  refilled. The day before your appointment: write a list of all prescriptions that need to be refilled. The day of the appointment: give the list to the admitting nurse. Prescriptions will be written only during appointments. No prescriptions will be written on procedure days.   If you forget a medication: it will not be "Called in", "Faxed", or "electronically sent". You will need to get another appointment to get these prescribed. No early refills. Do not call asking to have your prescription filled early. Prescription Accuracy: You are responsible for carefully inspecting your prescriptions before leaving our office. Have the discharge nurse carefully go over each prescription with you, before taking them home. Make sure that your name is accurately spelled, that your address is correct. Check the name and dose of your medication to make sure it is accurate. Check the number of pills, and the written instructions to make sure they are clear and accurate. Make sure that you are given enough medication to last until your next medication refill appointment. Taking Medication: Take medication as prescribed. When it comes to controlled substances, taking less pills or less frequently than prescribed is permitted and encouraged. Never take more pills than instructed. Never take medication more frequently than prescribed.  Inform other Doctors: Always inform, all of your healthcare providers, of all the medications you take. Pain Medication from other Providers: You are not allowed to accept any additional pain medication from any other Doctor or Healthcare provider. There are two exceptions to this rule. (see below) In the event that you require additional pain medication, you are responsible for notifying us, as stated below. Cough Medicine: Often these contain an opioid, such as codeine or hydrocodone. Never accept or take cough medicine containing these opioids if you are already taking an opioid*  medication. The combination may cause respiratory failure and death. Medication Agreement: You are responsible for carefully reading and following our Medication Agreement. This must be signed before receiving any prescriptions from our practice. Safely store a copy of your signed Agreement. Violations to the Agreement will result in no further prescriptions. (Additional copies of our Medication Agreement are available upon request.) Laws, Rules, & Regulations: All patients are expected to follow all Federal and State Laws, Statutes, Rules, & Regulations. Ignorance of the Laws does not constitute a valid excuse.  Illegal drugs and Controlled Substances: The use of illegal substances (including, but not limited to marijuana and its derivatives) and/or the illegal use of any controlled substances is strictly prohibited. Violation of this rule may result in the immediate and permanent discontinuation of any and all prescriptions being written by our practice. The use of any illegal substances is prohibited. Adopted CDC guidelines & recommendations: Target dosing levels will be at or below 60 MME/day. Use of benzodiazepines** is not recommended.  Exceptions: There are only two exceptions to the rule of not receiving pain medications from other Healthcare Providers. Exception #1 (Emergencies): In the event of an emergency (i.e.: accident requiring emergency care), you are allowed to receive additional pain medication. However, you are responsible for: As soon as you are able, call our office [phone], at any time of the day or night, and leave a message stating your name, the date and nature of the emergency, and the name and dose of the medication prescribed. In the event that your call is answered by a member of our staff, make sure to document and save the date, time, and the name of the person that took your information.  Exception #2 (Planned Surgery): In the event that you are scheduled by another  doctor or dentist to have any type of surgery or procedure, you are allowed (for a period no longer than 30 days), to receive additional pain medication, for the acute post-op pain.   However, in this case, you are responsible for picking up a copy of our "Post-op Pain Management for Surgeons" handout, and giving it to your surgeon or dentist. This document is available at our office, and does not require an appointment to obtain it. Simply go to our office during business hours (Monday-Thursday from 8:00 AM to 4:00 PM) (Friday 8:00 AM to 12:00 Noon) or if you have a scheduled appointment with us, prior to your surgery, and ask for it by name. In addition, you are responsible for: calling our office [phone], at any time of the day or night, and leaving a message stating your name, name of your surgeon, type of surgery, and date of procedure or surgery. Failure to comply with your responsibilities may result in termination of therapy involving the controlled substances. Medication Agreement Violation. Following the above rules, including your responsibilities will help you in avoiding a Medication Agreement Violation ("Breaking your Pain Medication Contract").  *Opioid medications include: morphine, codeine, oxycodone, oxymorphone, hydrocodone, hydromorphone, meperidine, tramadol, tapentadol, buprenorphine, fentanyl, methadone. **Benzodiazepine medications include: diazepam (Valium), alprazolam (Xanax), clonazepam (Klonopine), lorazepam (Ativan), clorazepate (Tranxene), chlordiazepoxide (Librium), estazolam (Prosom), oxazepam (Serax), temazepam (Restoril), triazolam (Halcion) (Last updated: 06/10/2021) ____________________________________________________________________________________________  ____________________________________________________________________________________________  Medication Recommendations and Reminders  Applies to: All patients receiving prescriptions (written and/or  electronic).  Medication Rules & Regulations: These rules and regulations exist for your safety and that of others. They are not flexible and neither are we. Dismissing or ignoring them will be considered "non-compliance" with medication therapy, resulting in complete and irreversible termination of such therapy. (See document titled "Medication Rules" for more details.) In all conscience, because of safety reasons, we cannot continue providing a therapy where the patient does not follow instructions.  Pharmacy of record:  Definition: This is the pharmacy where your electronic prescriptions will be sent.  We do not endorse any particular pharmacy, however, we have experienced problems with [facility] not securing enough medication supply for the community. We do not restrict you in your choice of pharmacy. However, once we write for your prescriptions, we will NOT be re-sending more prescriptions to fix restricted supply problems created by your pharmacy, or your insurance.  The pharmacy listed in the electronic medical record should be the one where you want electronic prescriptions to be sent. If you choose to change pharmacy, simply notify our nursing staff.  Recommendations: Keep all of your pain medications in a safe place, under lock and key, even if you live alone. We will NOT replace lost, stolen, or damaged medication. After you fill your prescription, take 1 week's worth of pills and put them away in a safe place. You should keep a separate, properly labeled bottle for this purpose. The remainder should be kept in the original bottle. Use this as your primary supply, until it runs out. Once it's gone, then you know that you have 1 week's worth of medicine, and it is time to come in for a prescription refill. If you do this correctly, it is unlikely that you will ever run out of medicine. To make sure that the above recommendation works, it is very important that you make sure your medication  refill appointments are scheduled at least 1 week before you run out of medicine. To do this in an effective manner, make sure that you do not leave the office without scheduling your next medication management appointment. Always ask the nursing staff to show you in your prescription , when your medication will be running out.   Then arrange for the receptionist to get you a return appointment, at least 7 days before you run out of medicine. Do not wait until you have 1 or 2 pills left, to come in. This is very poor planning and does not take into consideration that we may need to cancel appointments due to bad weather, sickness, or emergencies affecting our staff. DO NOT ACCEPT A "Partial Fill": If for any reason your pharmacy does not have enough pills/tablets to completely fill or refill your prescription, do not allow for a "partial fill". The law allows the pharmacy to complete that prescription within 72 hours, without requiring a new prescription. If they do not fill the rest of your prescription within those 72 hours, you will need a separate prescription to fill the remaining amount, which we will NOT provide. If the reason for the partial fill is your insurance, you will need to talk to the pharmacist about payment alternatives for the remaining tablets, but again, DO NOT ACCEPT A PARTIAL FILL, unless you can trust your pharmacist to obtain the remainder of the pills within 72 hours.  Prescription refills and/or changes in medication(s):  Prescription refills, and/or changes in dose or medication, will be conducted only during scheduled medication management appointments. (Applies to both, written and electronic prescriptions.) No refills on procedure days. No medication will be changed or started on procedure days. No changes, adjustments, and/or refills will be conducted on a procedure day. Doing so will interfere with the diagnostic portion of the procedure. No phone refills. No medications will be  "called into the pharmacy". No Fax refills. No weekend refills. No Holliday refills. No after hours refills.  Remember:  Business hours are:  Monday to Thursday 8:00 AM to 4:00 PM Provider's Schedule: Tehillah Cipriani, MD - Appointments are:  Medication management: Monday and Wednesday 8:00 AM to 4:00 PM Procedure day: Tuesday and Thursday 7:30 AM to 4:00 PM Bilal Lateef, MD - Appointments are:  Medication management: Tuesday and Thursday 8:00 AM to 4:00 PM Procedure day: Monday and Wednesday 7:30 AM to 4:00 PM (Last update: 04/02/2020) ____________________________________________________________________________________________  ____________________________________________________________________________________________  CBD (cannabidiol) & Delta-8 (Delta-8 tetrahydrocannabinol) WARNING  Intro: Cannabidiol (CBD) and tetrahydrocannabinol (THC), are two natural compounds found in plants of the Cannabis genus. They can both be extracted from hemp or cannabis. Hemp and cannabis come from the Cannabis sativa plant. Both compounds interact with your body's endocannabinoid system, but they have very different effects. CBD does not produce the high sensation associated with cannabis. Delta-8 tetrahydrocannabinol, also known as delta-8 THC, is a psychoactive substance found in the Cannabis sativa plant, of which marijuana and hemp are two varieties. THC is responsible for the high associated with the illicit use of marijuana.  Applicable to: All individuals currently taking or considering taking CBD (cannabidiol) and, more important, all patients taking opioid analgesic controlled substances (pain medication). (Example: oxycodone; oxymorphone; hydrocodone; hydromorphone; morphine; methadone; tramadol; tapentadol; fentanyl; buprenorphine; butorphanol; dextromethorphan; meperidine; codeine; etc.)  Legal status: CBD remains a Schedule I drug prohibited for any use. CBD is illegal with one exception.  In the [place], CBD has a limited Food and Drug Administration (FDA) approval for the treatment of two specific types of epilepsy disorders. Only one CBD product has been approved by the FDA for this purpose: "Epidiolex". FDA is aware that some companies are marketing products containing cannabis and cannabis-derived compounds in ways that violate the Federal Food, Drug and Cosmetic Act (FD&C Act) and that may put the health and   safety of consumers at risk. The FDA, a Federal agency, has not enforced the CBD status since 2018. UPDATE: (10/30/2021) The Drug Enforcement Agency (DEA) issued a letter stating that "delta" cannabinoids, including Delta-8-THCO and Delta-9-THCO, synthetically derived from hemp do not qualify as hemp and will be viewed as Schedule I drugs. (Schedule I drugs, substances, or chemicals are defined as drugs with no currently accepted medical use and a high potential for abuse. Some examples of Schedule I drugs are: heroin, lysergic acid diethylamide (LSD), marijuana (cannabis), 3,4-methylenedioxymethamphetamine (ecstasy), methaqualone, and peyote.) (https://www.dea.gov)  Legality: Some manufacturers ship CBD products nationally, which is illegal. Often such products are sold online and are therefore available throughout the country. CBD is openly sold in head shops and health food stores in some states where such sales have not been explicitly legalized. Selling unapproved products with unsubstantiated therapeutic claims is not only a violation of the law, but also can put patients at risk, as these products have not been proven to be safe or effective. Federal illegality makes it difficult to conduct research on CBD.  Reference: "FDA Regulation of Cannabis and Cannabis-Derived Products, Including Cannabidiol (CBD)" - https://www.fda.gov/news-events/public-health-focus/fda-regulation-cannabis-and-cannabis-derived-products-including-cannabidiol-cbd  Warning: CBD is not FDA approved  and has not undergo the same manufacturing controls as prescription drugs.  This means that the purity and safety of available CBD may be questionable. Most of the time, despite manufacturer's claims, it is contaminated with THC (delta-9-tetrahydrocannabinol - the chemical in marijuana responsible for the "HIGH").  When this is the case, the THC contaminant will trigger a positive urine drug screen (UDS) test for Marijuana (carboxy-THC). Because a positive UDS for any illicit substance is a violation of our medication agreement, your opioid analgesics (pain medicine) may be permanently discontinued. The FDA recently put out a warning about 5 things that everyone should be aware of regarding Delta-8 THC: Delta-8 THC products have not been evaluated or approved by the FDA for safe use and may be marketed in ways that put the public health at risk. The FDA has received adverse event reports involving delta-8 THC-containing products. Delta-8 THC has psychoactive and intoxicating effects. Delta-8 THC manufacturing often involve use of potentially harmful chemicals to create the concentrations of delta-8 THC claimed in the marketplace. The final delta-8 THC product may have potentially harmful by-products (contaminants) due to the chemicals used in the process. Manufacturing of delta-8 THC products may occur in uncontrolled or unsanitary settings, which may lead to the presence of unsafe contaminants or other potentially harmful substances. Delta-8 THC products should be kept out of the reach of children and pets.  MORE ABOUT CBD  General Information: CBD was discovered in 1940 and it is a derivative of the cannabis sativa genus plants (Marijuana and Hemp). It is one of the 113 identified substances found in Marijuana. It accounts for up to 40% of the plant's extract. As of 2018, preliminary clinical studies on CBD included research for the treatment of anxiety, movement disorders, and pain. CBD is available and  consumed in multiple forms, including inhalation of smoke or vapor, as an aerosol spray, and by mouth. It may be supplied as an oil containing CBD, capsules, dried cannabis, or as a liquid solution. CBD is thought not to be as psychoactive as THC (delta-9-tetrahydrocannabinol - the chemical in marijuana responsible for the "HIGH"). Studies suggest that CBD may interact with different biological target receptors in the body, including cannabinoid and other neurotransmitter receptors. As of 2018 the mechanism of action for its biological effects   has not been determined.  Side-effects  Adverse reactions: Dry mouth, diarrhea, decreased appetite, fatigue, drowsiness, malaise, weakness, sleep disturbances, and others.  Drug interactions: CBC may interact with other medications such as blood-thinners. Because CBD causes drowsiness on its own, it also increases the drowsiness caused by other medications, including antihistamines (such as Benadryl), benzodiazepines (Xanax, Ativan, Valium), antipsychotics, antidepressants and opioids, as well as alcohol and supplements such as kava, melatonin and St. John's Wort. Be cautious with the following combinations:   Brivaracetam (Briviact) Brivaracetam is changed and broken down by the body. CBD might decrease how quickly the body breaks down brivaracetam. This might increase levels of brivaracetam in the body.  Caffeine Caffeine is changed and broken down by the body. CBD might decrease how quickly the body breaks down caffeine. This might increase levels of caffeine in the body.  Carbamazepine (Tegretol) Carbamazepine is changed and broken down by the body. CBD might decrease how quickly the body breaks down carbamazepine. This might increase levels of carbamazepine in the body and increase its side effects.  Citalopram (Celexa) Citalopram is changed and broken down by the body. CBD might decrease how quickly the body breaks down citalopram. This might increase  levels of citalopram in the body and increase its side effects.  Clobazam (Onfi) Clobazam is changed and broken down by the liver. CBD might decrease how quickly the liver breaks down clobazam. This might increase the effects and side effects of clobazam.  Eslicarbazepine (Aptiom) Eslicarbazepine is changed and broken down by the body. CBD might decrease how quickly the body breaks down eslicarbazepine. This might increase levels of eslicarbazepine in the body by a small amount.  Everolimus (Zostress) Everolimus is changed and broken down by the body. CBD might decrease how quickly the body breaks down everolimus. This might increase levels of everolimus in the body.  Lithium Taking higher doses of CBD might increase levels of lithium. This can increase the risk of lithium toxicity.  Medications changed by the liver (Cytochrome P450 1A1 (CYP1A1) substrates) Some medications are changed and broken down by the liver. CBD might change how quickly the liver breaks down these medications. This could change the effects and side effects of these medications.  Medications changed by the liver (Cytochrome P450 1A2 (CYP1A2) substrates) Some medications are changed and broken down by the liver. CBD might change how quickly the liver breaks down these medications. This could change the effects and side effects of these medications.  Medications changed by the liver (Cytochrome P450 1B1 (CYP1B1) substrates) Some medications are changed and broken down by the liver. CBD might change how quickly the liver breaks down these medications. This could change the effects and side effects of these medications.  Medications changed by the liver (Cytochrome P450 2A6 (CYP2A6) substrates) Some medications are changed and broken down by the liver. CBD might change how quickly the liver breaks down these medications. This could change the effects and side effects of these medications.  Medications changed by the liver  (Cytochrome P450 2B6 (CYP2B6) substrates) Some medications are changed and broken down by the liver. CBD might change how quickly the liver breaks down these medications. This could change the effects and side effects of these medications.  Medications changed by the liver (Cytochrome P450 2C19 (CYP2C19) substrates) Some medications are changed and broken down by the liver. CBD might change how quickly the liver breaks down these medications. This could change the effects and side effects of these medications.  Medications changed by the   liver (Cytochrome P450 2C8 (CYP2C8) substrates) Some medications are changed and broken down by the liver. CBD might change how quickly the liver breaks down these medications. This could change the effects and side effects of these medications.  Medications changed by the liver (Cytochrome P450 2C9 (CYP2C9) substrates) Some medications are changed and broken down by the liver. CBD might change how quickly the liver breaks down these medications. This could change the effects and side effects of these medications.  Medications changed by the liver (Cytochrome P450 2D6 (CYP2D6) substrates) Some medications are changed and broken down by the liver. CBD might change how quickly the liver breaks down these medications. This could change the effects and side effects of these medications.  Medications changed by the liver (Cytochrome P450 2E1 (CYP2E1) substrates) Some medications are changed and broken down by the liver. CBD might change how quickly the liver breaks down these medications. This could change the effects and side effects of these medications.  Medications changed by the liver (Cytochrome P450 3A4 (CYP3A4) substrates) Some medications are changed and broken down by the liver. CBD might change how quickly the liver breaks down these medications. This could change the effects and side effects of these medications.  Medications changed by the liver  (Glucuronidated drugs) Some medications are changed and broken down by the liver. CBD might change how quickly the liver breaks down these medications. This could change the effects and side effects of these medications.  Medications that decrease the breakdown of other medications by the liver (Cytochrome P450 2C19 (CYP2C19) inhibitors) CBD is changed and broken down by the liver. Some drugs decrease how quickly the liver changes and breaks down CBD. This could change the effects and side effects of CBD.  Medications that decrease the breakdown of other medications in the liver (Cytochrome P450 3A4 (CYP3A4) inhibitors) CBD is changed and broken down by the liver. Some drugs decrease how quickly the liver changes and breaks down CBD. This could change the effects and side effects of CBD.  Medications that increase breakdown of other medications by the liver (Cytochrome P450 3A4 (CYP3A4) inducers) CBD is changed and broken down by the liver. Some drugs increase how quickly the liver changes and breaks down CBD. This could change the effects and side effects of CBD.  Medications that increase the breakdown of other medications by the liver (Cytochrome P450 2C19 (CYP2C19) inducers) CBD is changed and broken down by the liver. Some drugs increase how quickly the liver changes and breaks down CBD. This could change the effects and side effects of CBD.  Methadone (Dolophine) Methadone is broken down by the liver. CBD might decrease how quickly the liver breaks down methadone. Taking cannabidiol along with methadone might increase the effects and side effects of methadone.  Rufinamide (Banzel) Rufinamide is changed and broken down by the body. CBD might decrease how quickly the body breaks down rufinamide. This might increase levels of rufinamide in the body by a small amount.  Sedative medications (CNS depressants) CBD might cause sleepiness and slowed breathing. Some medications, called sedatives,  can also cause sleepiness and slowed breathing. Taking CBD with sedative medications might cause breathing problems and/or too much sleepiness.  Sirolimus (Rapamune) Sirolimus is changed and broken down by the body. CBD might decrease how quickly the body breaks down sirolimus. This might increase levels of sirolimus in the body.  Stiripentol (Diacomit) Stiripentol is changed and broken down by the body. CBD might decrease how quickly the body breaks down   stiripentol. This might increase levels of stiripentol in the body and increase its side effects.  Tacrolimus (Prograf) Tacrolimus is changed and broken down by the body. CBD might decrease how quickly the body breaks down tacrolimus. This might increase levels of tacrolimus in the body.  Tamoxifen (Soltamox) Tamoxifen is changed and broken down by the body. CBD might affect how quickly the body breaks down tamoxifen. This might affect levels of tamoxifen in the body.  Topiramate (Topamax) Topiramate is changed and broken down by the body. CBD might decrease how quickly the body breaks down topiramate. This might increase levels of topiramate in the body by a small amount.  Valproate Valproic acid can cause liver injury. Taking cannabidiol with valproic acid might increase the chance of liver injury. CBD and/or valproic acid might need to be stopped, or the dose might need to be reduced.  Warfarin (Coumadin) CBD might increase levels of warfarin, which can increase the risk for bleeding. CBD and/or warfarin might need to be stopped, or the dose might need to be reduced.  Zonisamide Zonisamide is changed and broken down by the body. CBD might decrease how quickly the body breaks down zonisamide. This might increase levels of zonisamide in the body by a small amount. (Last update: 11/11/2021) ____________________________________________________________________________________________

## 2022-04-21 ENCOUNTER — Ambulatory Visit: Payer: Medicare Other | Attending: Pain Medicine | Admitting: Pain Medicine

## 2022-04-21 ENCOUNTER — Encounter: Payer: Self-pay | Admitting: Pain Medicine

## 2022-04-21 VITALS — BP 123/76 | HR 61 | Temp 98.2°F | Ht 69.0 in | Wt 165.0 lb

## 2022-04-21 DIAGNOSIS — M5442 Lumbago with sciatica, left side: Secondary | ICD-10-CM | POA: Insufficient documentation

## 2022-04-21 DIAGNOSIS — M5441 Lumbago with sciatica, right side: Secondary | ICD-10-CM | POA: Diagnosis present

## 2022-04-21 DIAGNOSIS — Z79891 Long term (current) use of opiate analgesic: Secondary | ICD-10-CM | POA: Diagnosis present

## 2022-04-21 DIAGNOSIS — M79604 Pain in right leg: Secondary | ICD-10-CM | POA: Diagnosis present

## 2022-04-21 DIAGNOSIS — M5030 Other cervical disc degeneration, unspecified cervical region: Secondary | ICD-10-CM

## 2022-04-21 DIAGNOSIS — M25559 Pain in unspecified hip: Secondary | ICD-10-CM | POA: Insufficient documentation

## 2022-04-21 DIAGNOSIS — M79605 Pain in left leg: Secondary | ICD-10-CM | POA: Diagnosis present

## 2022-04-21 DIAGNOSIS — M5137 Other intervertebral disc degeneration, lumbosacral region: Secondary | ICD-10-CM | POA: Diagnosis present

## 2022-04-21 DIAGNOSIS — G894 Chronic pain syndrome: Secondary | ICD-10-CM | POA: Diagnosis present

## 2022-04-21 DIAGNOSIS — G8929 Other chronic pain: Secondary | ICD-10-CM

## 2022-04-21 DIAGNOSIS — Z79899 Other long term (current) drug therapy: Secondary | ICD-10-CM | POA: Insufficient documentation

## 2022-04-21 DIAGNOSIS — M542 Cervicalgia: Secondary | ICD-10-CM | POA: Insufficient documentation

## 2022-04-21 MED ORDER — OXYCODONE HCL 5 MG PO TABS: 5.0000 mg | ORAL_TABLET | Freq: Three times a day (TID) | ORAL | 0 refills | Status: AC | PRN

## 2022-04-21 MED ORDER — OXYCODONE HCL 5 MG PO TABS: 5.0000 mg | ORAL_TABLET | Freq: Three times a day (TID) | ORAL | 0 refills | Status: DC | PRN

## 2022-04-21 NOTE — Progress Notes (Signed)
Morphine 29 1/2 tabs taken to pharmacy in a sealed container, wasted into medication waste bin, witnessed by patient.

## 2022-04-21 NOTE — Progress Notes (Signed)
Nursing Pain Medication Assessment:  Safety precautions to be maintained throughout the outpatient stay will include: orient to surroundings, keep bed in low position, maintain call bell within reach at all times, provide assistance with transfer out of bed and ambulation.  Nursing Pain Medication Assessment:  Safety precautions to be maintained throughout the outpatient stay will include: orient to surroundings, keep bed in low position, maintain call bell within reach at all times, provide assistance with transfer out of bed and ambulation.  Medication Inspection Compliance: Pill count conducted under aseptic conditions, in front of the patient. Neither the pills nor the bottle was removed from the patient's sight at any time. Once count was completed pills were immediately returned to the patient in their original bottle.  Medication #1: Oxycodone IR Pill/Patch Count: 0 out of 90 Pill/Patch Appearance: Markings consistent with prescribed medication Bottle Appearance: Standard pharmacy container. Clearly labeled. Filled Date: 6 / 22 / 2023 Last Medication intake:  Ran out of medicine more than 48 hours ago  Medication #2: Morphine ER (MSContin) Pill/Patch Count:  29 1/2 of 60 pills remain Pill/Patch Appearance: Markings consistent with prescribed medication Bottle Appearance: Standard pharmacy container. Clearly labeled. Filled Date: 7 / 10 / 2023 Last Medication intake:  Today

## 2022-06-14 ENCOUNTER — Encounter: Payer: Medicare Other | Admitting: Pain Medicine

## 2022-06-23 ENCOUNTER — Encounter: Payer: Medicare Other | Admitting: Pain Medicine

## 2022-07-13 NOTE — Patient Instructions (Signed)
____________________________________________________________________________________________  Patient Information update  To: All of our patients.  Re: Name change.  It has been made official that our current name, "[facility]"   will soon be changed to "[facility]".   The purpose of this change is to eliminate any confusion created by the concept of our practice being a "Medication Management Pain Clinic". In the past this has led to the misconception that we treat pain primarily by the use of prescription medications.  Nothing can be farther from the truth.   Understanding PAIN MANAGEMENT: To further understand what our practice does, you first have to understand that "Pain Management" is a subspecialty that requires additional training once a physician has completed their specialty training, which can be in either Anesthesia, Neurology, Psychiatry, or Physical Medicine and Rehabilitation (PMR). Each one of these contributes to the final approach taken by each physician to the management of their patient's pain. To be a "Pain Management Specialist" you must have first completed one of the specialty trainings below.  Anesthesiologists - trained in clinical pharmacology and interventional techniques such as nerve blockade and regional as well as central neuroanatomy. They are trained to block pain before, during, and after surgical interventions.  Neurologists - trained in the diagnosis and pharmacological treatment of complex neurological conditions, such as Multiple Sclerosis, Parkinson's, spinal cord injuries, and other systemic conditions that may be associated with symptoms that may include but are not limited to pain. They tend to rely primarily on the treatment of chronic pain using prescription medications.  Psychiatrist - trained in conditions affecting the psychosocial wellbeing  of patients including but not limited to depression, anxiety, schizophrenia, personality disorders, addiction, and other substance use disorders that may be associated with chronic pain. They tend to rely primarily on the treatment of chronic pain using prescription medications.   Physical Medicine and Rehabilitation (PMR) physicians, also known as physiatrists - trained to treat a wide variety of medical conditions affecting the brain, spinal cord, nerves, bones, joints, ligaments, muscles, and tendons. Their training is primarily aimed at treating patients that have suffered injuries that have caused severe physical impairment. Their training is primarily aimed at the physical therapy and rehabilitation of those patients. They may also work alongside orthopedic surgeons or neurosurgeons using their expertise in assisting surgical patients to recover after their surgeries.  INTERVENTIONAL PAIN MANAGEMENT is sub-subspecialty of Pain Management.  Our physicians are Board-certified in Anesthesia, Pain Management, and Interventional Pain Management.  This meaning that not only have they been trained and Board-certified in their specialty of Anesthesia, and subspecialty of Pain Management, but they have also received further training in the sub-subspecialty of Interventional Pain Management, in order to become Board-certified as INTERVENTIONAL PAIN MANAGEMENT SPECIALIST.    Mission: Our goal is to use our skills in  INTERVENTIONAL PAIN MANAGEMENT as alternatives to the chronic use of prescription opioid medications for the treatment of pain. To make this more clear, we have changed our name to reflect what we do and offer. We will continue to offer medication management assessment and recommendations, but we will not be taking over any patient's medication management.  ____________________________________________________________________________________________   _______________________________________________________________________  Medication Rules  Purpose: To inform patients, and their family members, of our medication rules and regulations.  Applies to: All patients receiving prescriptions from our practice (written or electronic).  Pharmacy of record: This is the pharmacy where your electronic prescriptions will be sent.   Make sure we have the correct one.  Electronic prescriptions: In compliance with the [place] Strengthen Opioid Misuse Prevention (STOP) Act of 2017 (Session Law 2017-74/H243), effective September 13, 2018, all controlled substances must be electronically prescribed. Written prescriptions, faxing, or calling prescriptions to a pharmacy will no longer be done.  Prescription refills: These will be provided only during in-person appointments. No medications will be renewed without a "face-to-face" evaluation with your provider. Applies to all prescriptions.  NOTE: The following applies primarily to controlled substances (Opioid* Pain Medications).   Type of encounter (visit): For patients receiving controlled substances, face-to-face visits are required. (Not an option and not up to the patient.)  Patient's responsibilities: Pain Pills: Bring all pain pills to every appointment (except for procedure appointments). Pill Bottles: Bring pills in original pharmacy bottle. Bring bottle, even if empty. Always bring the bottle of the most recent fill.  Medication refills: You are responsible for knowing and keeping track of what medications you are taking and when is it that you will need a refill. The day before your appointment: write a list of all prescriptions that need to be refilled. The day of the appointment: give the list to the admitting nurse. Prescriptions will be written only during appointments. No prescriptions will be written on procedure days. If you forget a medication: it will not be "Called in", "Faxed", or  "electronically sent". You will need to get another appointment to get these prescribed. No early refills. Do not call asking to have your prescription filled early. Partial  or short prescriptions: Occasionally your pharmacy may not have enough pills to fill your prescription.  NEVER ACCEPT a partial fill or a prescription that is short of the total amount of pills that you were prescribed.  With controlled substances the law allows 72 hours for the pharmacy to complete the prescription.  If the prescription is not completed within 72 hours, the pharmacist will require a new prescription to be written. This means that you will be short on your medicine and we WILL NOT send another prescription to complete your original prescription.  Instead, request the pharmacy to send a carrier to a nearby branch to get enough medication to provide you with your full prescription. Prescription Accuracy: You are responsible for carefully inspecting your prescriptions before leaving our office. Have the discharge nurse carefully go over each prescription with you, before taking them home. Make sure that your name is accurately spelled, that your address is correct. Check the name and dose of your medication to make sure it is accurate. Check the number of pills, and the written instructions to make sure they are clear and accurate. Make sure that you are given enough medication to last until your next medication refill appointment. Taking Medication: Take medication as prescribed. When it comes to controlled substances, taking less pills or less frequently than prescribed is permitted and encouraged. Never take more pills than instructed. Never take the medication more frequently than prescribed.  Inform other Doctors: Always inform, all of your healthcare providers, of all the medications you take. Pain Medication from other Providers: You are not allowed to accept any additional pain medication from any other Doctor or  Healthcare provider. There are two exceptions to this rule. (see below) In the event that you require additional pain medication, you are responsible for notifying us, as stated below. Cough Medicine: Often these contain an opioid, such as codeine or hydrocodone. Never accept or take cough medicine containing these opioids if   you are already taking an opioid* medication. The combination may cause respiratory failure and death. Medication Agreement: You are responsible for carefully reading and following our Medication Agreement. This must be signed before receiving any prescriptions from our practice. Safely store a copy of your signed Agreement. Violations to the Agreement will result in no further prescriptions. (Additional copies of our Medication Agreement are available upon request.) Laws, Rules, & Regulations: All patients are expected to follow all Federal and State Laws, Statutes, Rules, & Regulations. Ignorance of the Laws does not constitute a valid excuse.  Illegal drugs and Controlled Substances: The use of illegal substances (including, but not limited to marijuana and its derivatives) and/or the illegal use of any controlled substances is strictly prohibited. Violation of this rule may result in the immediate and permanent discontinuation of any and all prescriptions being written by our practice. The use of any illegal substances is prohibited. Adopted CDC guidelines & recommendations: Target dosing levels will be at or below 60 MME/day. Use of benzodiazepines** is not recommended.  Exceptions: There are only two exceptions to the rule of not receiving pain medications from other Healthcare Providers. Exception #1 (Emergencies): In the event of an emergency (i.e.: accident requiring emergency care), you are allowed to receive additional pain medication. However, you are responsible for: As soon as you are able, call our office [phone], at any time of the day or night, and leave a  message stating your name, the date and nature of the emergency, and the name and dose of the medication prescribed. In the event that your call is answered by a member of our staff, make sure to document and save the date, time, and the name of the person that took your information.  Exception #2 (Planned Surgery): In the event that you are scheduled by another doctor or dentist to have any type of surgery or procedure, you are allowed (for a period no longer than 30 days), to receive additional pain medication, for the acute post-op pain. However, in this case, you are responsible for picking up a copy of our "Post-op Pain Management for Surgeons" handout, and giving it to your surgeon or dentist. This document is available at our office, and does not require an appointment to obtain it. Simply go to our office during business hours (Monday-Thursday from 8:00 AM to 4:00 PM) (Friday 8:00 AM to 12:00 Noon) or if you have a scheduled appointment with us, prior to your surgery, and ask for it by name. In addition, you are responsible for: calling our office [phone], at any time of the day or night, and leaving a message stating your name, name of your surgeon, type of surgery, and date of procedure or surgery. Failure to comply with your responsibilities may result in termination of therapy involving the controlled substances. Medication Agreement Violation. Following the above rules, including your responsibilities will help you in avoiding a Medication Agreement Violation ("Breaking your Pain Medication Contract").  Consequences:  Not following the above rules may result in permanent discontinuation of medication prescription therapy.  *Opioid medications include: morphine, codeine, oxycodone, oxymorphone, hydrocodone, hydromorphone, meperidine, tramadol, tapentadol, buprenorphine, fentanyl, methadone. **Benzodiazepine medications include: diazepam (Valium), alprazolam (Xanax), clonazepam (Klonopine),  lorazepam (Ativan), clorazepate (Tranxene), chlordiazepoxide (Librium), estazolam (Prosom), oxazepam (Serax), temazepam (Restoril), triazolam (Halcion) (Last updated: 07/06/2022) ______________________________________________________________________   ______________________________________________________________________  Medication Recommendations and Reminders  Applies to: All patients receiving prescriptions (written and/or electronic).  Medication Rules & Regulations: You are responsible for reading, knowing, and   following our "Medication Rules" document. These exist for your safety and that of others. They are not flexible and neither are we. Dismissing or ignoring them is an act of "non-compliance" that may result in complete and irreversible termination of such medication therapy. For safety reasons, "non-compliance" will not be tolerated. As with the [place] fundamental legal principle of "ignorance of the law is no defense", we will accept no excuses for not having read and knowing the content of documents provided to you by our practice.  Pharmacy of record:  Definition: This is the pharmacy where your electronic prescriptions will be sent.  We do not endorse any particular pharmacy. It is up to you and your insurance to decide what pharmacy to use.  We do not restrict you in your choice of pharmacy. However, once we write for your prescriptions, we will NOT be re-sending more prescriptions to fix restricted supply problems created by your pharmacy, or your insurance.  The pharmacy listed in the electronic medical record should be the one where you want electronic prescriptions to be sent. If you choose to change pharmacy, simply notify our nursing staff. Changes will be made only during your regular appointments and not over the phone.  Recommendations: Keep all of your pain medications in a safe place, under lock and key, even if you live alone. We will NOT replace lost, stolen, or  damaged medication. We do not accept "Police Reports" as proof of medications having been stolen. After you fill your prescription, take 1 week's worth of pills and put them away in a safe place. You should keep a separate, properly labeled bottle for this purpose. The remainder should be kept in the original bottle. Use this as your primary supply, until it runs out. Once it's gone, then you know that you have 1 week's worth of medicine, and it is time to come in for a prescription refill. If you do this correctly, it is unlikely that you will ever run out of medicine. To make sure that the above recommendation works, it is very important that you make sure your medication refill appointments are scheduled at least 1 week before you run out of medicine. To do this in an effective manner, make sure that you do not leave the office without scheduling your next medication management appointment. Always ask the nursing staff to show you in your prescription , when your medication will be running out. Then arrange for the receptionist to get you a return appointment, at least 7 days before you run out of medicine. Do not wait until you have 1 or 2 pills left, to come in. This is very poor planning and does not take into consideration that we may need to cancel appointments due to bad weather, sickness, or emergencies affecting our staff. DO NOT ACCEPT A "Partial Fill": If for any reason your pharmacy does not have enough pills/tablets to completely fill or refill your prescription, do not allow for a "partial fill". The law allows the pharmacy to complete that prescription within 72 hours, without requiring a new prescription. If they do not fill the rest of your prescription within those 72 hours, you will need a separate prescription to fill the remaining amount, which we will NOT provide. If the reason for the partial fill is your insurance, you will need to talk to the pharmacist about payment alternatives for  the remaining tablets, but again, DO NOT ACCEPT A PARTIAL FILL, unless you can trust your pharmacist to obtain   the remainder of the pills within 72 hours.  Prescription refills and/or changes in medication(s):  Prescription refills, and/or changes in dose or medication, will be conducted only during scheduled medication management appointments. (Applies to both, written and electronic prescriptions.) No refills on procedure days. No medication will be changed or started on procedure days. No changes, adjustments, and/or refills will be conducted on a procedure day. Doing so will interfere with the diagnostic portion of the procedure. No phone refills. No medications will be "called into the pharmacy". No Fax refills. No weekend refills. No Holliday refills. No after hours refills.  Remember:  Business hours are:  Monday to Thursday 8:00 AM to 4:00 PM Provider's Schedule: Imelda Dandridge, MD - Appointments are:  Medication management: Monday and Wednesday 8:00 AM to 4:00 PM Procedure day: Tuesday and Thursday 7:30 AM to 4:00 PM Bilal Lateef, MD - Appointments are:  Medication management: Tuesday and Thursday 8:00 AM to 4:00 PM Procedure day: Monday and Wednesday 7:30 AM to 4:00 PM (Last update: 07/06/2022) ______________________________________________________________________  ____________________________________________________________________________________________  Pharmacy Shortages of Pain Medication   Introduction Shockingly as it may seem, .  "No U.S[place] Supreme Court decision has ever interpreted the Constitution as guaranteeing a right to health care for all Americans." - https://www.healthequityandpolicylab.com/elusive-right-to-health-care-under-us-law  "With respect to human rights, the [place] has no formally codified right to health, nor does it participate in a human rights treaty that specifies a right to health." - Scott J. Schweikart, JD, MBE  Situation By  now, most of our patients have had the experience of being told by their pharmacist that they do not have enough medication to cover their prescription. If you have not had this experience, just know that you soon will.  Problem There appears to be a shortage of these medications, either at the national level or locally. This is happening with all pharmacies. When there is not enough medication, patients are offered a partial fill and they are told that they will try to get the rest of the medicine for them at a later time. If they do not have enough for even a partial fill, the pharmacists are telling the patients to call us (the prescribing physicians) to request that we send another prescription to another pharmacy to get the medicine.   This reordering of a controlled substance creates documentation problems where additional paperwork needs to be created to explain why two prescriptions for the same period of time and the same medicine are being prescribed to the same patient. It also creates situations where the last appointment note does not accurately reflect when and what prescriptions were given to a patient. This leads to prescribing errors down the line, in subsequent follow-up visits.   [place] Board of Pharmacy (NCBOP) Research revealed that Board of Pharmacy Rule .1806 (21 NCAC 46.1806) authorizes pharmacists to the transfer of prescriptions among pharmacies, and it sets forth procedural and recordkeeping requirements for doing so. However, this requires the pharmacist to complete the previously mentioned procedural paperwork to accomplish the transfer. As it turns out, it is much easier for them to have the prescribing physicians do the work.   Possible solutions 1. You can ask your physician to assist you in weaning yourself off these medications. 2. Ask your pharmacy if the medication is in stock, 3 days prior to your refill. 3. If you need a pharmacy change, let us know at your  medication management visit. Prescriptions that have already been electronically sent to a pharmacy will   not be re-sent to a different pharmacy if your pharmacy of record does not have it in stock. Proper stocking of medication is a pharmacy problem, not a prescriber problem. Work with your pharmacist to solve the problem. 4. Have the [place] State Assembly add a provision to the "STOP ACT" (the law that mandates how controlled substances are prescribed) where there is an exception to the electronic prescribing rule that states that in the event there are shortages of medications the physicians are allowed to use written prescriptions as opposed to electronic ones. This would allow patients to take their prescriptions to a different pharmacy that may have enough medication available to fill the prescription. The problem is that currently there is a law that does not allow for written prescriptions, with the exception of instances where the electronic medical record is down due to technical issues.  5. Have [place] Congress ease the pressure on pharmaceutical companies, allowing them to produce enough quantities of the medication to adequately supply the population. 6. Have pharmacies keep enough stocks of these medications to cover their client base.  7. Have the [place] State Assembly add a provision to the "STOP ACT" where they ease the regulations surrounding the transfer of controlled substances between pharmacies, so as to simplify the transfer of supplies. As an alternative, develop a system to allow patients to obtain the remainder of their prescription at another one of their pharmacies or at an associate pharmacy.   How this shortage will affect you.  Understand that this is a pharmacy supply problem, not a prescriber problem. Work with your pharmacy to solve it. The job of the prescriber is to evaluate and monitor the patient for the appropriate indications and use of these medicines. It is  not the job of the prescriber to supply the medication or to solve problems with that supply. The responsibility and the choice to obtain the medication resides on the patient. By law, supplying the medication is the job of the pharmacy. It is certainly not the job of the prescriber to solve supply problems.   Due to the above problems we are no longer taking patients to write for their pain medication. Future discussions with your physician may include potentially weaning medications or transitioning to alternatives.  We will be focusing primarily on interventional based pain management. We will continue to evaluate for appropriate indications and we may provide recommendations regarding medication, dose, and schedule, as well as monitoring recommendations, however, we will not be taking over the actual prescribing of these substances. On those patients where we are treating their chronic pain with interventional therapies, exceptions will be considered on a case by case basis. At this time, we will try to continue providing this supplemental service to those patients we have been managing in the past. However, as of August 1st, 2023, we no longer will be sending additional prescriptions to other pharmacies for the purpose of solving their supply problems. Once we send a prescription to a pharmacy, we will not be resending it again to another pharmacy to cover for their shortages.   What to do. Write as many letters as you can. Recruit the help of family members in writing these letters. Below are some of the places where you can write to make your voice heard. Let them know what the problem is and push them to look for solutions.   Search internet for: "[place] find your legislators" https://www.ncleg.gov/findyourlegislators  Search internet for: "[place] insurance commissioner   complaints" https://www.ncdoi.gov/contactscomplaints/assistance-or-file-complaint  Search internet for: "[place] Board of Pharmacy complaints" http://www.ncbop.org/contact.htm  Search internet for: "[facility] pharmacy complaints" Email [facility] Customer Relations https://www.[facility].com/help/email-[email or web address].jsp?callType=store  Search internet for: "[facility] pharmacy customer service complaints" https://www.[facility].com/topic/marketing/contactus/contactus_customerservice.jsp  ____________________________________________________________________________________________  ____________________________________________________________________________________________  Drug Holidays (Slow)  What is a "Drug Holiday"? Drug Holiday: is the name given to the period of time during which a patient stops taking a medication(s) for the purpose of eliminating tolerance to the drug.  Benefits Improved effectiveness of opioids. Decreased opioid dose needed to achieve benefits. Improved pain with lesser dose.  What is tolerance? Tolerance: is the progressive decreased in effectiveness of a drug due to its repetitive use. With repetitive use, the body gets use to the medication and as a consequence, it loses its effectiveness. This is a common problem seen with opioid pain medications. As a result, a larger dose of the drug is needed to achieve the same effect that used to be obtained with a smaller dose.  How long should a "Drug Holiday" last? You should stay off of the pain medicine for at least 14 consecutive days. (2 weeks)  Should I stop the medicine "cold turkey"? No. You should always coordinate with your Pain Specialist so that he/she can provide you with the correct medication dose to make the transition as smoothly as possible.  How do I stop the medicine? Slowly. You will be instructed to decrease the daily amount of pills that you take by one (1) pill every seven (7) days. This is called a "slow downward taper" of your dose. For example: if you normally take four (4) pills per day, you will  be asked to drop this dose to three (3) pills per day for seven (7) days, then to two (2) pills per day for seven (7) days, then to one (1) per day for seven (7) days, and at the end of those last seven (7) days, this is when the "Drug Holiday" would start.   Will I have withdrawals? By doing a "slow downward taper" like this one, it is unlikely that you will experience any significant withdrawal symptoms. Typically, what triggers withdrawals is the sudden stop of a high dose opioid therapy. Withdrawals can usually be avoided by slowly decreasing the dose over a prolonged period of time. If you do not follow these instructions and decide to stop your medication abruptly, withdrawals may be possible.  What are withdrawals? Withdrawals: refers to the wide range of symptoms that occur after stopping or dramatically reducing opiate drugs after heavy and prolonged use. Withdrawal symptoms do not occur to patients that use low dose opioids, or those who take the medication sporadically. Contrary to benzodiazepine (example: Valium, Xanax, etc.) or alcohol withdrawals ("Delirium Tremens"), opioid withdrawals are not lethal. Withdrawals are the physical manifestation of the body getting rid of the excess receptors.  Expected Symptoms Early symptoms of withdrawal may include: Agitation Anxiety Muscle aches Increased tearing Insomnia Runny nose Sweating Yawning  Late symptoms of withdrawal may include: Abdominal cramping Diarrhea Dilated pupils Goose bumps Nausea Vomiting  Will I experience withdrawals? Due to the slow nature of the taper, it is very unlikely that you will experience any.  What is a slow taper? Taper: refers to the gradual decrease in dose.  (Last update: 04/02/2020) ____________________________________________________________________________________________   ____________________________________________________________________________________________  CBD (cannabidiol) &  Delta-8 (Delta-8 tetrahydrocannabinol) WARNING  Intro: Cannabidiol (CBD) and tetrahydrocannabinol (THC), are two natural compounds found in plants of the Cannabis genus. They can both be extracted   from hemp or cannabis. Hemp and cannabis come from the Cannabis sativa plant. Both compounds interact with your body's endocannabinoid system, but they have very different effects. CBD does not produce the high sensation associated with cannabis. Delta-8 tetrahydrocannabinol, also known as delta-8 THC, is a psychoactive substance found in the Cannabis sativa plant, of which marijuana and hemp are two varieties. THC is responsible for the high associated with the illicit use of marijuana.  Applicable to: All individuals currently taking or considering taking CBD (cannabidiol) and, more important, all patients taking opioid analgesic controlled substances (pain medication). (Example: oxycodone; oxymorphone; hydrocodone; hydromorphone; morphine; methadone; tramadol; tapentadol; fentanyl; buprenorphine; butorphanol; dextromethorphan; meperidine; codeine; etc.)  Legal status: CBD remains a Schedule I drug prohibited for any use. CBD is illegal with one exception. In the [place], CBD has a limited Food and Drug Administration (FDA) approval for the treatment of two specific types of epilepsy disorders. Only one CBD product has been approved by the FDA for this purpose: "Epidiolex". FDA is aware that some companies are marketing products containing cannabis and cannabis-derived compounds in ways that violate the Federal Food, Drug and Cosmetic Act (FD&C Act) and that may put the health and safety of consumers at risk. The FDA, a Federal agency, has not enforced the CBD status since 2018. UPDATE: (10/30/2021) The Drug Enforcement Agency (DEA) issued a letter stating that "delta" cannabinoids, including Delta-8-THCO and Delta-9-THCO, synthetically derived from hemp do not qualify as hemp and will be viewed as Schedule I  drugs. (Schedule I drugs, substances, or chemicals are defined as drugs with no currently accepted medical use and a high potential for abuse. Some examples of Schedule I drugs are: heroin, lysergic acid diethylamide (LSD), marijuana (cannabis), 3,4-methylenedioxymethamphetamine (ecstasy), methaqualone, and peyote.) (https://www.dea.gov)  Legality: Some manufacturers ship CBD products nationally, which is illegal. Often such products are sold online and are therefore available throughout the country. CBD is openly sold in head shops and health food stores in some states where such sales have not been explicitly legalized. Selling unapproved products with unsubstantiated therapeutic claims is not only a violation of the law, but also can put patients at risk, as these products have not been proven to be safe or effective. Federal illegality makes it difficult to conduct research on CBD.  Reference: "FDA Regulation of Cannabis and Cannabis-Derived Products, Including Cannabidiol (CBD)" - https://www.fda.gov/news-events/public-health-focus/fda-regulation-cannabis-and-cannabis-derived-products-including-cannabidiol-cbd  Warning: CBD is not FDA approved and has not undergo the same manufacturing controls as prescription drugs.  This means that the purity and safety of available CBD may be questionable. Most of the time, despite manufacturer's claims, it is contaminated with THC (delta-9-tetrahydrocannabinol - the chemical in marijuana responsible for the "HIGH").  When this is the case, the THC contaminant will trigger a positive urine drug screen (UDS) test for Marijuana (carboxy-THC). Because a positive UDS for any illicit substance is a violation of our medication agreement, your opioid analgesics (pain medicine) may be permanently discontinued. The FDA recently put out a warning about 5 things that everyone should be aware of regarding Delta-8 THC: Delta-8 THC products have not been evaluated or approved by  the FDA for safe use and may be marketed in ways that put the public health at risk. The FDA has received adverse event reports involving delta-8 THC-containing products. Delta-8 THC has psychoactive and intoxicating effects. Delta-8 THC manufacturing often involve use of potentially harmful chemicals to create the concentrations of delta-8 THC claimed in the marketplace. The final delta-8 THC product may have potentially   harmful by-products (contaminants) due to the chemicals used in the process. Manufacturing of delta-8 THC products may occur in uncontrolled or unsanitary settings, which may lead to the presence of unsafe contaminants or other potentially harmful substances. Delta-8 THC products should be kept out of the reach of children and pets.  MORE ABOUT CBD  General Information: CBD was discovered in 1940 and it is a derivative of the cannabis sativa genus plants (Marijuana and Hemp). It is one of the 113 identified substances found in Marijuana. It accounts for up to 40% of the plant's extract. As of 2018, preliminary clinical studies on CBD included research for the treatment of anxiety, movement disorders, and pain. CBD is available and consumed in multiple forms, including inhalation of smoke or vapor, as an aerosol spray, and by mouth. It may be supplied as an oil containing CBD, capsules, dried cannabis, or as a liquid solution. CBD is thought not to be as psychoactive as THC (delta-9-tetrahydrocannabinol - the chemical in marijuana responsible for the "HIGH"). Studies suggest that CBD may interact with different biological target receptors in the body, including cannabinoid and other neurotransmitter receptors. As of 2018 the mechanism of action for its biological effects has not been determined.  Side-effects  Adverse reactions: Dry mouth, diarrhea, decreased appetite, fatigue, drowsiness, malaise, weakness, sleep disturbances, and others.  Drug interactions: CBC may interact with other  medications such as blood-thinners. Because CBD causes drowsiness on its own, it also increases the drowsiness caused by other medications, including antihistamines (such as Benadryl), benzodiazepines (Xanax, Ativan, Valium), antipsychotics, antidepressants and opioids, as well as alcohol and supplements such as kava, melatonin and St. John's Wort. Be cautious with the following combinations:   Brivaracetam (Briviact) Brivaracetam is changed and broken down by the body. CBD might decrease how quickly the body breaks down brivaracetam. This might increase levels of brivaracetam in the body.  Caffeine Caffeine is changed and broken down by the body. CBD might decrease how quickly the body breaks down caffeine. This might increase levels of caffeine in the body.  Carbamazepine (Tegretol) Carbamazepine is changed and broken down by the body. CBD might decrease how quickly the body breaks down carbamazepine. This might increase levels of carbamazepine in the body and increase its side effects.  Citalopram (Celexa) Citalopram is changed and broken down by the body. CBD might decrease how quickly the body breaks down citalopram. This might increase levels of citalopram in the body and increase its side effects.  Clobazam (Onfi) Clobazam is changed and broken down by the liver. CBD might decrease how quickly the liver breaks down clobazam. This might increase the effects and side effects of clobazam.  Eslicarbazepine (Aptiom) Eslicarbazepine is changed and broken down by the body. CBD might decrease how quickly the body breaks down eslicarbazepine. This might increase levels of eslicarbazepine in the body by a small amount.  Everolimus (Zostress) Everolimus is changed and broken down by the body. CBD might decrease how quickly the body breaks down everolimus. This might increase levels of everolimus in the body.  Lithium Taking higher doses of CBD might increase levels of lithium. This can increase  the risk of lithium toxicity.  Medications changed by the liver (Cytochrome P450 1A1 (CYP1A1) substrates) Some medications are changed and broken down by the liver. CBD might change how quickly the liver breaks down these medications. This could change the effects and side effects of these medications.  Medications changed by the liver (Cytochrome P450 1A2 (CYP1A2) substrates) Some medications are   changed and broken down by the liver. CBD might change how quickly the liver breaks down these medications. This could change the effects and side effects of these medications.  Medications changed by the liver (Cytochrome P450 1B1 (CYP1B1) substrates) Some medications are changed and broken down by the liver. CBD might change how quickly the liver breaks down these medications. This could change the effects and side effects of these medications.  Medications changed by the liver (Cytochrome P450 2A6 (CYP2A6) substrates) Some medications are changed and broken down by the liver. CBD might change how quickly the liver breaks down these medications. This could change the effects and side effects of these medications.  Medications changed by the liver (Cytochrome P450 2B6 (CYP2B6) substrates) Some medications are changed and broken down by the liver. CBD might change how quickly the liver breaks down these medications. This could change the effects and side effects of these medications.  Medications changed by the liver (Cytochrome P450 2C19 (CYP2C19) substrates) Some medications are changed and broken down by the liver. CBD might change how quickly the liver breaks down these medications. This could change the effects and side effects of these medications.  Medications changed by the liver (Cytochrome P450 2C8 (CYP2C8) substrates) Some medications are changed and broken down by the liver. CBD might change how quickly the liver breaks down these medications. This could change the effects and side effects  of these medications.  Medications changed by the liver (Cytochrome P450 2C9 (CYP2C9) substrates) Some medications are changed and broken down by the liver. CBD might change how quickly the liver breaks down these medications. This could change the effects and side effects of these medications.  Medications changed by the liver (Cytochrome P450 2D6 (CYP2D6) substrates) Some medications are changed and broken down by the liver. CBD might change how quickly the liver breaks down these medications. This could change the effects and side effects of these medications.  Medications changed by the liver (Cytochrome P450 2E1 (CYP2E1) substrates) Some medications are changed and broken down by the liver. CBD might change how quickly the liver breaks down these medications. This could change the effects and side effects of these medications.  Medications changed by the liver (Cytochrome P450 3A4 (CYP3A4) substrates) Some medications are changed and broken down by the liver. CBD might change how quickly the liver breaks down these medications. This could change the effects and side effects of these medications.  Medications changed by the liver (Glucuronidated drugs) Some medications are changed and broken down by the liver. CBD might change how quickly the liver breaks down these medications. This could change the effects and side effects of these medications.  Medications that decrease the breakdown of other medications by the liver (Cytochrome P450 2C19 (CYP2C19) inhibitors) CBD is changed and broken down by the liver. Some drugs decrease how quickly the liver changes and breaks down CBD. This could change the effects and side effects of CBD.  Medications that decrease the breakdown of other medications in the liver (Cytochrome P450 3A4 (CYP3A4) inhibitors) CBD is changed and broken down by the liver. Some drugs decrease how quickly the liver changes and breaks down CBD. This could change the effects  and side effects of CBD.  Medications that increase breakdown of other medications by the liver (Cytochrome P450 3A4 (CYP3A4) inducers) CBD is changed and broken down by the liver. Some drugs increase how quickly the liver changes and breaks down CBD. This could change the effects and side effects of   CBD.  Medications that increase the breakdown of other medications by the liver (Cytochrome P450 2C19 (CYP2C19) inducers) CBD is changed and broken down by the liver. Some drugs increase how quickly the liver changes and breaks down CBD. This could change the effects and side effects of CBD.  Methadone (Dolophine) Methadone is broken down by the liver. CBD might decrease how quickly the liver breaks down methadone. Taking cannabidiol along with methadone might increase the effects and side effects of methadone.  Rufinamide (Banzel) Rufinamide is changed and broken down by the body. CBD might decrease how quickly the body breaks down rufinamide. This might increase levels of rufinamide in the body by a small amount.  Sedative medications (CNS depressants) CBD might cause sleepiness and slowed breathing. Some medications, called sedatives, can also cause sleepiness and slowed breathing. Taking CBD with sedative medications might cause breathing problems and/or too much sleepiness.  Sirolimus (Rapamune) Sirolimus is changed and broken down by the body. CBD might decrease how quickly the body breaks down sirolimus. This might increase levels of sirolimus in the body.  Stiripentol (Diacomit) Stiripentol is changed and broken down by the body. CBD might decrease how quickly the body breaks down stiripentol. This might increase levels of stiripentol in the body and increase its side effects.  Tacrolimus (Prograf) Tacrolimus is changed and broken down by the body. CBD might decrease how quickly the body breaks down tacrolimus. This might increase levels of tacrolimus in the body.  Tamoxifen  (Soltamox) Tamoxifen is changed and broken down by the body. CBD might affect how quickly the body breaks down tamoxifen. This might affect levels of tamoxifen in the body.  Topiramate (Topamax) Topiramate is changed and broken down by the body. CBD might decrease how quickly the body breaks down topiramate. This might increase levels of topiramate in the body by a small amount.  Valproate Valproic acid can cause liver injury. Taking cannabidiol with valproic acid might increase the chance of liver injury. CBD and/or valproic acid might need to be stopped, or the dose might need to be reduced.  Warfarin (Coumadin) CBD might increase levels of warfarin, which can increase the risk for bleeding. CBD and/or warfarin might need to be stopped, or the dose might need to be reduced.  Zonisamide Zonisamide is changed and broken down by the body. CBD might decrease how quickly the body breaks down zonisamide. This might increase levels of zonisamide in the body by a small amount. (Last update: 11/11/2021) ____________________________________________________________________________________________  Naloxone Nasal Spray What is this medication? NALOXONE (nal OX one) treats opioid overdose, which causes slow or shallow breathing, severe drowsiness, or trouble staying awake. Call emergency services after using this medication. You may need additional treatment. Naloxone works by reversing the effects of opioids. It belongs to a group of medications called opioid blockers. This medicine may be used for other purposes; ask your health care provider or pharmacist if you have questions. COMMON BRAND NAME(S): Kloxxado, Narcan What should I tell my care team before I take this medication? They need to know if you have any of these conditions: Heart disease Substance use disorder An unusual or allergic reaction to naloxone, other medications, foods, dyes, or preservatives Pregnant or trying to get  pregnant Breast-feeding How should I use this medication? This medication is for use in the nose. Lay the person on their back. Support their neck with your hand and allow the head to tilt back before giving the medication. The nasal spray should be given into   1 nostril. After giving the medication, move the person onto their side. Do not remove or test the nasal spray until ready to use. Get emergency medical help right away after giving the first dose of this medication, even if the person wakes up. You should be familiar with how to recognize the signs and symptoms of a narcotic overdose. If more doses are needed, give the additional dose in the other nostril. Talk to your care team about the use of this medication in children. While this medication may be prescribed for children as young as newborns for selected conditions, precautions do apply. Overdosage: If you think you have taken too much of this medicine contact a poison control center or emergency room at once. NOTE: This medicine is only for you. Do not share this medicine with others. What if I miss a dose? This does not apply. What may interact with this medication? This is only used during an emergency. No interactions are expected during emergency use. This list may not describe all possible interactions. Give your health care provider a list of all the medicines, herbs, non-prescription drugs, or dietary supplements you use. Also tell them if you smoke, drink alcohol, or use illegal drugs. Some items may interact with your medicine. What should I watch for while using this medication? Keep this medication ready for use in the case of an opioid overdose. Make sure that you have the phone number of your care team and local hospital ready. You may need to have additional doses of this medication. Each nasal spray contains a single dose. Some emergencies may require additional doses. After use, bring the treated person to the nearest  hospital or call 911. Make sure the treating care team knows that the person has received a dose of this medication. You will receive additional instructions on what to do during and after use of this medication before an emergency occurs. What side effects may I notice from receiving this medication? Side effects that you should report to your care team as soon as possible: Allergic reactions--skin rash, itching, hives, swelling of the face, lips, tongue, or throat Side effects that usually do not require medical attention (report these to your care team if they continue or are bothersome): Constipation Dryness or irritation inside the nose Headache Increase in blood pressure Muscle spasms Stuffy nose Toothache This list may not describe all possible side effects. Call your doctor for medical advice about side effects. You may report side effects to FDA at 1-800-FDA-1088. Where should I keep my medication? Keep out of the reach of children and pets. Store between 20 and 25 degrees C (68 and 77 degrees F). Do not freeze. Throw away any unused medication after the expiration date. Keep in original box until ready to use. NOTE: This sheet is a summary. It may not cover all possible information. If you have questions about this medicine, talk to your doctor, pharmacist, or health care provider.  2023 [employer]/Gold Standard (2021-05-08 00:00:00)  

## 2022-07-13 NOTE — Progress Notes (Unsigned)
PROVIDER NOTE: Information contained herein reflects review and annotations entered in association with encounter. Interpretation of such information and data should be left to medically-trained personnel. Information provided to patient can be located elsewhere in the medical record under "Patient Instructions". Document created using STT-dictation technology, any transcriptional errors that may result from process are unintentional.    Patient: Ricky Mercer  Service Category: E/M  Provider: Gaspar Cola, MD  DOB: [birth year]  DOS: 07/14/2022  Referring Provider: Sofie Hartigan, MD  MRN: [id number]  Specialty: Interventional Pain Management  PCP: Sofie Hartigan, MD  Type: Established Patient  Setting: Ambulatory outpatient    Location: Office  Delivery: Face-to-face     HPI  [name], a 71 y.o. year old male, is here today because of his Chronic pain syndrome [G89.4]. [name] primary complain today is No chief complaint on file. Last encounter: My last encounter with him was on 04/21/2022. Pertinent problems: [name] has Hand weakness; Polyradiculopathy; Chronic low back pain (1ry area of Pain) (Bilateral) (L>R); Chronic lower extremity pain (3ry area of Pain) (Bilateral) (L>R); Chronic lumbar radicular pain (Bilateral) (L>R) (Bilateral S1 dermatomal distribution); Chronic hip pain (2ry area of Pain) (Bilateral) (L>R); Chronic upper back pain (Bilateral) (L>R); Chronic neck pain (neck<shoulder) (L>R); Chronic knee pain (Right); Cervical spondylosis; 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); Lumbar spondylosis; Cervical facet hypertrophy; Cervical facet syndrome (R>L); Radicular pain of shoulder; Peripheral neuropathy (HCC) (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); Lumbar facet syndrome (Bilateral) (L>R); Chronic shoulder pain (Bilateral) (L>R); Chronic sacroiliac joint pain (Bilateral) (L>R); Chronic musculoskeletal pain; Chronic foot pain  (Bilateral); Neurogenic pain; Chronic pain syndrome; Bilateral arm pain; DDD (degenerative disc disease), cervical; DDD (degenerative disc disease), lumbosacral; Cervicogenic headache; Cervico-occipital neuralgia; Osteoarthritis of spine with radiculopathy, lumbar region; Decreased sensation; Numbness and tingling; Weakness of left leg; Bilateral leg and foot pain; Cervicalgia; Chronic upper extremity pain (Bilateral) (L>R); Abnormal MRI, cervical spine (12/07/2019); Chronic burning sensation of feet (Bilateral); Numbness and tingling of both legs; Spondylosis without myelopathy or radiculopathy, cervical region; Osteoarthritis involving multiple joints; Chronic mid back pain; Total body pain; Idiopathic small fiber sensory neuropathy; Fibromyalgia; and Small fiber neuropathy on their pertinent problem list. Pain Assessment: Severity of   is reported as a  /10. Location:    / . Onset:  . Quality:  . Timing:  . Modifying factor(s):  Marland Kitchen Vitals:  vitals were not taken for this visit.   Reason for encounter: medication management. ***  RTCB: 10/18/2022 Nonopioids transferred 06/25/2020: Vitamin D3, Neurontin, and Mobic.  Pharmacotherapy Assessment  Analgesic: Oxycodone 5 mg, 1 tab PO q 8 hrs (15 mg/day of oxycodone); switch to MS Contin 15 mg p.o. twice daily (30 mg/day of morphine) on 03/15/2022. MME/day: 22.5 mg/day increased on 03/15/2022 to 30 mg/day.   Monitoring: [place] PMP: PDMP reviewed during this encounter.       Pharmacotherapy: No side-effects or adverse reactions reported. Compliance: No problems identified. Effectiveness: Clinically acceptable.  No notes on file  No results found for: "CBDTHCR" No results found for: "D8THCCBX" No results found for: "D9THCCBX"  UDS:  Summary  Date Value Ref Range Status  03/15/2022 Note  Final    Comment:    ==================================================================== [employer] [facility]  (MW) ==================================================================== Test                             Result       Flag  Units  Drug Present not Declared for Prescription Verification   Oxycodone                      426          UNEXPECTED ng/mg creat   Oxymorphone                    3517         UNEXPECTED ng/mg creat   Noroxycodone                   905          UNEXPECTED ng/mg creat   Noroxymorphone                 793          UNEXPECTED ng/mg creat    Sources of oxycodone are scheduled prescription medications.    Oxymorphone, noroxycodone, and noroxymorphone are expected    metabolites of oxycodone. Oxymorphone is also available as a    scheduled prescription medication.  Drug Absent but Declared for Prescription Verification   Morphine                       Not Detected UNEXPECTED ng/mg creat ==================================================================== Test                      Result    Flag   Units      Ref Range   Creatinine              174              mg/dL      >=20 ==================================================================== Declared Medications:  The flagging and interpretation on this report are based on the  following declared medications.  Unexpected results may arise from  inaccuracies in the declared medications.   **Note: The testing scope of this panel includes these medications:   Morphine (MS Contin)   **Note: The testing scope of this panel does not include the  following reported medications:   Aspirin  Calcium  Cholecalciferol  Gabapentin (Neurontin)  Pantoprazole (Protonix)  Simvastatin (Zocor) ==================================================================== For clinical consultation, please call [phone]. ====================================================================       ROS  Constitutional: Denies any fever or chills Gastrointestinal: No reported hemesis, hematochezia, vomiting, or acute GI  distress Musculoskeletal: Denies any acute onset joint swelling, redness, loss of ROM, or weakness Neurological: No reported episodes of acute onset apraxia, aphasia, dysarthria, agnosia, amnesia, paralysis, loss of coordination, or loss of consciousness  Medication Review  aspirin, calcium-vitamin D, gabapentin, oxyCODONE, pantoprazole, and simvastatin  History Review  Allergy: [name] has No Known Allergies. Drug: [name]  reports no history of drug use. Alcohol:  reports no history of alcohol use. Tobacco:  reports that he has never smoked. He has never used smokeless tobacco. Social: [name]  reports that he has never smoked. He has never used smokeless tobacco. He reports that he does not drink alcohol and does not use drugs. Medical:  has a past medical history of Acute postoperative pain (03/21/2017), Anginal pain ([place]), Bilateral arm pain, Cardiomyopathy ([place]), Cardiomyopathy, idiopathic ([place]), Chronic back pain, Chronic pain syndrome, Depression, Diverticulosis, Dupuytren's contracture, Erosive gastritis, Fibromyalgia, Foot pain, bilateral, GERD (gastroesophageal reflux disease), Hand weakness, Hyperlipidemia, Knee pain, Midline low back pain, Neuromuscular disorder (HCC), Opioid dependence ([place]), Osteoarthritis, Pain in neck, Palpitations, Prostate cancer ([place]), Reactive airway disease, Seasonal allergies, Situational  anxiety, Skin cancer, Sleep apnea, and Tubular adenoma of colon. Surgical: [name]  has a past surgical history that includes Nose surgery; Hand surgery (Bilateral); Colonoscopy (10/24/2012); Upper gi endoscopy (12/18/2012); Tonsillectomy (2009); Skin cancer excision (Left, 2015); Mass excision (Right, 07/18/2015); Esophagogastroduodenoscopy (egd) with propofol (N/A, 10/28/2015); prostate seeding; Hernia repair; Cholecystectomy (N/A, 12/02/2015); Esophagogastroduodenoscopy (N/A, 11/17/2017); Colonoscopy with propofol (N/A, 11/17/2017); Esophagogastroduodenoscopy (N/A,  03/24/2020); and Colonoscopy with propofol (N/A, 11/03/2020). Family: family history includes Colon cancer in his brother; Hyperlipidemia in his sister; Lung cancer in his father; Prostate cancer in his brother.  Laboratory Chemistry Profile   Renal Lab Results  Component Value Date   BUN 11 08/04/2020   CREATININE 0.99 08/04/2020   BCR 12 08/06/2019   GFRAA 99 08/06/2019   GFRNONAA >60 08/04/2020    Hepatic Lab Results  Component Value Date   AST 20 08/04/2020   ALT 15 08/04/2020   ALBUMIN 3.8 08/04/2020   ALKPHOS 47 08/04/2020   LIPASE 24 08/04/2020    Electrolytes Lab Results  Component Value Date   NA 139 08/04/2020   K 4.3 08/04/2020   CL 102 08/04/2020   CALCIUM 8.8 (L) 08/04/2020   MG 2.0 08/06/2019    Bone Lab Results  Component Value Date   25OHVITD1 49 08/06/2019   25OHVITD2 <1.0 08/06/2019   25OHVITD3 49 08/06/2019    Inflammation (CRP: Acute Phase) (ESR: Chronic Phase) Lab Results  Component Value Date   CRP <1 08/06/2019   ESRSEDRATE 2 08/06/2019         Note: Above Lab results reviewed.  Recent Imaging Review  CT CHEST WO CONTRAST CLINICAL DATA:  Right lower chest pain for 5 months, increasing, no known injury  EXAM: CT CHEST WITHOUT CONTRAST  TECHNIQUE: Multidetector CT imaging of the chest was performed following the standard protocol without IV contrast.  COMPARISON:  11/15/2013  FINDINGS: Cardiovascular: No significant vascular findings. Normal heart size. No pericardial effusion.  Mediastinum/Nodes: No enlarged mediastinal, hilar, or axillary lymph nodes. Thyroid gland, trachea, and esophagus demonstrate no significant findings.  Lungs/Pleura: There is a small, definitively benign calcified nodule of the right upper lobe (series 3, image 40). There are occasional tiny noncalcified nodules, for example a 3 mm nodule of the lateral segment right middle lobe, stable compared to prior examination dated 2015 and definitively benign  (series 3, image 96). No pleural effusion or pneumothorax.  Upper Abdomen: No acute abnormality.  Musculoskeletal: No chest wall mass or suspicious bone lesions identified.  IMPRESSION: 1. No non-contrast CT findings of the chest to explain right-sided chest pain.  2. Occasional tiny, stable, and definitively benign small pulmonary nodules for which no further follow-up is required.  Electronically Signed   By: Eddie Candle M.D.   On: 09/17/2020 13:26 Note: Reviewed        Physical Exam  General appearance: Well nourished, well developed, and well hydrated. In no apparent acute distress Mental status: Alert, oriented x 3 (person, place, & time)       Respiratory: No evidence of acute respiratory distress Eyes: PERLA Vitals: There were no vitals taken for this visit. BMI: Estimated body mass index is 24.37 kg/m as calculated from the following:   Height as of 04/21/22: _0  (1.753 m).   Weight as of 04/21/22: 165 lb (74.8 kg). Ideal: Patient weight not recorded  Assessment   Diagnosis Status  1. Chronic pain syndrome   2. Chronic low back pain (1ry area of Pain) (Bilateral) (L>R)   3.  Chronic hip pain (2ry area of Pain) (Bilateral) (L>R)   4. Chronic lower extremity pain (3ry area of Pain) (Bilateral) (L>R)   5. Cervicalgia   6. DDD (degenerative disc disease), lumbosacral   7. DDD (degenerative disc disease), cervical   8. Pharmacologic therapy   9. Chronic use of opiate for therapeutic purpose   10. Encounter for medication management   11. Encounter for chronic pain management    Controlled Controlled Controlled   Updated Problems: No problems updated.   Plan of Care  Problem-specific:  No problem-specific Assessment & Plan notes found for this encounter.  [name] has a current medication list which includes the following long-term medication(s): calcium-vitamin d, gabapentin, oxycodone, and simvastatin.  Pharmacotherapy (Medications  Ordered): No orders of the defined types were placed in this encounter.  Orders:  No orders of the defined types were placed in this encounter.  Follow-up plan:   No follow-ups on file.     Interventional Therapies  Risk  Complexity Considerations:   Estimated body mass index is 25.1 kg/m as calculated from the following:   Height as of this encounter: _0  (1.753 m).   Weight as of this encounter: 170 lb (77.1 kg). WNL   Planned  Pending:      Under consideration:      Completed:   Palliative bilateral cervical facet block x1  Palliative right lumbar facet block x3  Palliative left lumbar facet block x3  Palliative right SI joint block x2  Palliative left SI joint block x2  Palliative left lumbar facet RFA (last done on 03/21/2017)  Palliative left SI joint RFA (last done on 03/21/2017)  Therapeutic/palliative left CESI x3  Palliative left cervical facet RFA x1 (last done 03/11/2020)   Therapeutic  Palliative (PRN) options:   Palliative bilateral cervical facet block #2  Palliative right lumbar facet block #4  Palliative left lumbar facet block #4  Palliative right SI joint block #3  Palliative left SI joint block #3  Palliative left lumbar facet RFA (last done on 03/21/2017)  Palliative left SI joint RFA (last done on 03/21/2017)  Therapeutic/palliative left CESI #4  Palliative left cervical facet RFA #2 (last done 03/11/2020)     Recent Visits Date Type Provider Dept  04/21/22 Office Visit Milinda Pointer, MD [facility]  Showing recent visits within past 90 days and meeting all other requirements Future Appointments Date Type Provider Dept  07/14/22 Appointment Milinda Pointer, MD [facility]  Showing future appointments within next 90 days and meeting all other requirements  I discussed the assessment and treatment plan with the patient. The patient was provided an opportunity to ask questions and all were answered. The patient agreed  with the plan and demonstrated an understanding of the instructions.  Patient advised to call back or seek an in-person evaluation if the symptoms or condition worsens.  Duration of encounter: *** minutes.  Total time on encounter, as per AMA guidelines included both the face-to-face and non-face-to-face time personally spent by the physician and/or other qualified health care professional(s) on the day of the encounter (includes time in activities that require the physician or other qualified health care professional and does not include time in activities normally performed by clinical staff). Physician's time may include the following activities when performed: preparing to see the patient (eg, review of tests, pre-charting review of records) obtaining and/or reviewing separately obtained history performing a medically appropriate examination and/or evaluation counseling and educating the patient/family/caregiver  ordering medications, tests, or procedures referring and communicating with other health care professionals (when not separately reported) documenting clinical information in the electronic or other health record independently interpreting results (not separately reported) and communicating results to the patient/ family/caregiver care coordination (not separately reported)  Note by: Gaspar Cola, MD Date: 07/14/2022; Time: 9:18 AM

## 2022-07-14 ENCOUNTER — Ambulatory Visit (HOSPITAL_BASED_OUTPATIENT_CLINIC_OR_DEPARTMENT_OTHER): Payer: Medicare Other | Admitting: Pain Medicine

## 2022-07-14 DIAGNOSIS — M5137 Other intervertebral disc degeneration, lumbosacral region: Secondary | ICD-10-CM

## 2022-07-14 DIAGNOSIS — G8929 Other chronic pain: Secondary | ICD-10-CM

## 2022-07-14 DIAGNOSIS — G894 Chronic pain syndrome: Secondary | ICD-10-CM

## 2022-07-14 DIAGNOSIS — Z79891 Long term (current) use of opiate analgesic: Secondary | ICD-10-CM

## 2022-07-14 DIAGNOSIS — Z91199 Patient's noncompliance with other medical treatment and regimen due to unspecified reason: Secondary | ICD-10-CM

## 2022-07-14 DIAGNOSIS — M5030 Other cervical disc degeneration, unspecified cervical region: Secondary | ICD-10-CM

## 2022-07-14 DIAGNOSIS — M542 Cervicalgia: Secondary | ICD-10-CM

## 2022-07-14 DIAGNOSIS — Z79899 Other long term (current) drug therapy: Secondary | ICD-10-CM

## 2022-07-21 ENCOUNTER — Encounter: Payer: Medicare Other | Admitting: Pain Medicine

## 2022-10-01 ENCOUNTER — Ambulatory Visit
Admission: EM | Admit: 2022-10-01 | Discharge: 2022-10-01 | Disposition: A | Discharge status: Home / Self Care | Payer: Medicare Other

## 2022-10-01 DIAGNOSIS — G894 Chronic pain syndrome: Secondary | ICD-10-CM

## 2022-10-01 DIAGNOSIS — T887XXA Unspecified adverse effect of drug or medicament, initial encounter: Secondary | ICD-10-CM | POA: Diagnosis not present

## 2022-10-01 DIAGNOSIS — R609 Edema, unspecified: Secondary | ICD-10-CM

## 2022-10-01 NOTE — Discharge Instructions (Addendum)
-  I think your swelling is related to the Lyrica.  Stop taking this medication and go back to taking gabapentin  if you need it.  Contact your pain clinic back on Monday and let them know about your reaction to this medication. - If you have any increase in your swelling or any shortness of breath you should go to the emergency department.

## 2022-10-01 NOTE — ED Provider Notes (Addendum)
[facility]    CSN: [id number] Arrival date & time: 10/01/22  1715      History   Chief Complaint Chief Complaint  Patient presents with   Facial Swelling   Foot Swelling   Leg Swelling         HPI DRESHON PROFFIT is a 72 y.o. male presenting for swelling of the hands, feet, face and tongue for the past couple of days.  He says he thinks it could potentially be due to the Lyrica which he started about 3 weeks ago.  He was previously on gabapentin and oxycodone.  He goes to pain clinic.  They have switched him to Lyrica and Suboxone patch.  Patient's next follow-up should be in the next week.  He says that he did experience little bit of swelling when he was on gabapentin but never like this.  He says that he only took Lyrica 1 time today and his swelling has mostly resolved but he still feels little puffiness in his cheeks.  He denies any difficulty swallowing or breathing and has not had any chest pain or shortness of breath.  He has not taken any other new medications and denies eating any new foods.  He says he eats basically the same thing every day.  He cannot think of anything else that could have caused the symptoms but he does not have any history of heart failure or kidney disease.  No other complaints.  HPI  Past Medical History:  Diagnosis Date   Acute postoperative pain 03/21/2017   Anginal pain (HCC)    chest pain w high risk for cardiac etiology   Bilateral arm pain    Cardiomyopathy ([place])    Cardiomyopathy, idiopathic (HCC)    Chronic back pain    Chronic pain syndrome    Depression    Diverticulosis    Dupuytren's contracture    Erosive gastritis    Fibromyalgia    Foot pain, bilateral    GERD (gastroesophageal reflux disease)    Hand weakness    Hyperlipidemia    Knee pain    Midline low back pain    Neuromuscular disorder (HCC)    lumbar polyradiculopathy, numbness and tingling of both legs   Opioid dependence (HCC)    Osteoarthritis     neck and back   Pain in neck    hurts to turn side to side   Palpitations    Prostate cancer (HCC)    prostate   Reactive airway disease    Seasonal allergies    Situational anxiety    Skin cancer    Sleep apnea    CPAP - only uses "sometimes"   Tubular adenoma of colon     Patient Active Problem List   Diagnosis Date Noted   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/17/2021   Small fiber neuropathy 02/18/2021   Chronic use of opiate for therapeutic purpose 12/16/2020   Fibromyalgia 99/37/1696   Uncomplicated opioid dependence ([place]) 09/16/2020   Chronic mid back pain 03/26/2020   Total body pain 03/26/2020   Osteoarthritis involving multiple joints 03/19/2020   Spondylosis without myelopathy or radiculopathy, cervical region 03/11/2020   Abnormal MRI, cervical spine (12/07/2019) 12/20/2019   Cervicalgia 11/26/2019   Chronic upper extremity pain (Bilateral) (L>R) 11/26/2019   Generalized anxiety disorder 11/26/2019   History of claustrophobia 11/26/2019   Major depressive disorder, single episode, severe with anxious distress ([place]) 03/28/2019   Bilateral leg and foot pain 07/26/2018   Decreased  sensation 06/01/2018   Numbness and tingling 06/01/2018   Weakness of left leg 06/01/2018   Chronic burning sensation of feet (Bilateral) 06/01/2018   Numbness and tingling of both legs 06/01/2018   Heart palpitations 05/11/2018   DDD (degenerative disc disease), cervical 04/26/2018   DDD (degenerative disc disease), lumbosacral 04/26/2018   Cervicogenic headache 04/26/2018   Cervico-occipital neuralgia 04/26/2018   Osteoarthritis of spine with radiculopathy, lumbar region 04/26/2018   Pharmacologic therapy 04/25/2018   Disorder of skeletal system 04/25/2018   Problems influencing health status 04/25/2018   Vitamin D insufficiency 05/09/2017   Chronic pain syndrome 08/30/2016   Chronic musculoskeletal pain 03/18/2016   Neurogenic pain 03/18/2016   Chronic sacroiliac joint  pain (Bilateral) (L>R) 12/23/2015   Chest pain 11/06/2015   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/06/2015   Chest pain with high risk for cardiac etiology 11/06/2015   Chronic shoulder pain (Bilateral) (L>R) 08/22/2015   Breathing orally 08/22/2015   Opioid-induced constipation (OIC) 08/20/2015   History of surgical procedure 08/12/2015   Long term current use of opiate analgesic 07/23/2015   Long term prescription opiate use 07/23/2015   Opiate use (45 MME/Day) 07/23/2015   Opioid type dependence ([place]) 07/23/2015   Encounter for therapeutic drug level monitoring 07/23/2015   Chronic low back pain (1ry area of Pain) (Bilateral) (L>R) 07/23/2015   Chronic lower extremity pain (3ry area of Pain) (Bilateral) (L>R) 07/23/2015   Chronic lumbar radicular pain (Bilateral) (L>R) (Bilateral S1 dermatomal distribution) 07/23/2015   Chronic hip pain (2ry area of Pain) (Bilateral) (L>R) 07/23/2015   Chronic upper back pain (Bilateral) (L>R) 07/23/2015   Chronic neck pain (neck<shoulder) (L>R) 07/23/2015   Chronic knee pain (Right) 07/23/2015   Cervical spondylosis 07/23/2015   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) 07/23/2015   Lumbar spondylosis 07/23/2015   Cervical facet hypertrophy 07/23/2015   Cervical facet syndrome (R>L) 07/23/2015   History of prostate cancer 07/23/2015   Radicular pain of shoulder 07/23/2015   Class I Morbid obesity (HCC) (68% higher incidence of chronic low back problems) 07/23/2015   Peripheral neuropathy (HCC) (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) 07/23/2015   Lumbar facet syndrome (Bilateral) (L>R) 07/23/2015   Sleep apnea 07/23/2015   Depression 07/23/2015   High cholesterol 07/23/2015   Low testosterone 07/23/2015   Polyradiculopathy 04/11/2015   Hand weakness 12/04/2014   Bilateral arm pain 12/04/2014   Chronic foot pain (Bilateral) 09/30/2014    Past Surgical History:  Procedure Laterality Date   CHOLECYSTECTOMY N/A 12/02/2015    Procedure: LAPAROSCOPIC CHOLECYSTECTOMY;  Surgeon: Leonie Green, MD;  Location: [facility] ORS;  Service: General;  Laterality: N/A;   COLONOSCOPY  10/24/2012   COLONOSCOPY WITH PROPOFOL N/A 11/17/2017   Procedure: COLONOSCOPY WITH PROPOFOL;  Surgeon: Lollie Sails, MD;  Location: [facility] ENDOSCOPY;  Service: Endoscopy;  Laterality: N/A;   COLONOSCOPY WITH PROPOFOL N/A 11/03/2020   Procedure: COLONOSCOPY WITH PROPOFOL;  Surgeon: [name], MD;  Location: [facility] ENDOSCOPY;  Service: Endoscopy;  Laterality: N/A;   ESOPHAGOGASTRODUODENOSCOPY N/A 11/17/2017   Procedure: ESOPHAGOGASTRODUODENOSCOPY (EGD);  Surgeon: Lollie Sails, MD;  Location: [facility] END[facility]COPY;  Service: Endoscopy;  Laterality: N/A;   ESOPHAGOGASTRODUODENOSCOPY N/A 03/24/2020   Procedure: ESOPHAGOGASTRODUODENOSCOPY (EGD);  Surgeon: [name], MD;  Location[facility]COPY;  Service: Endoscopy;  Laterality: N/A;   ESOPHAGOGASTRODUODENOSCOPY (EGD) WITH PROPOFOL N/A 10/28/2015   Procedure: ESOPHAGOGASTRODUODENOSCOPY (EGD) WITH PROPOFOL;  Surgeon: Lollie Sails, MD;  Location: [facility] ENDOSCOPY;  Service: Endoscopy;  Laterality: N/A;  HAND SURGERY Bilateral    HERNIA REPAIR     MASS EXCISION Right 07/18/2015   Procedure: EXCISION MASS RIGHT HAND;  Surgeon: Leanor Kail, MD;  Location: [place];  Service: Orthopedics;  Laterality: Right;  CPAP   NOSE SURGERY     prostate seeding     SKIN CANCER EXCISION Left 2015   shoulder   TONSILLECTOMY  2009   Surgery for OSA (screw in place)   UPPER GI ENDOSCOPY  12/18/2012   x3       Home Medications    Prior to Admission medications   Medication Sig Start Date End Date Taking? Authorizing Provider  aspirin 81 MG tablet Take 81 mg by mouth daily.   Yes [provider]  atorvastatin (LIPITOR) 80 MG tablet Take 1 tablet by mouth daily. 09/15/22  Yes [provider]  pregabalin (LYRICA) 100 MG capsule Take 100 mg by mouth 3 (three) times  daily. 09/09/22  Yes [provider]  risperiDONE (RISPERDAL) 1 MG tablet Take 1 mg by mouth at bedtime. 09/10/22  Yes [provider]  venlafaxine XR (EFFEXOR-XR) 75 MG 24 hr capsule Take 75 mg by mouth every morning. 09/10/22  Yes [provider]  calcium-vitamin D (OSCAL WITH D) 500-5 MG-MCG tablet Take 1 tablet by mouth.    [provider]  gabapentin (NEURONTIN) 300 MG capsule Take 300 mg by mouth 2 (two) times daily. 02/24/21   [provider]  oxyCODONE (OXY IR/ROXICODONE) 5 MG immediate release tablet Take 1 tablet (5 mg total) by mouth every 8 (eight) hours as needed for severe pain. Must last 30 days 06/20/22 07/20/22  Milinda Pointer, MD  pantoprazole (PROTONIX) 40 MG tablet Take 40 mg by mouth daily.    [provider]  simvastatin (ZOCOR) 10 MG tablet TAKE 1 TABLET BY MOUTH EVERY DAY AT NIGHT 11/15/18   [provider]    Family History Family History  Problem Relation Age of Onset   Lung cancer Father    Prostate cancer Brother    Colon cancer Brother    Hyperlipidemia Sister     Social History Social History   Tobacco Use   Smoking status: Never   Smokeless tobacco: Never  Vaping Use   Vaping Use: Never used  Substance Use Topics   Alcohol use: No    Alcohol/week: 0.0 standard drinks of alcohol   Drug use: No     Allergies   Patient has no known allergies.   Review of Systems Review of Systems  Constitutional:  Negative for fatigue.  HENT:  Positive for facial swelling and trouble swallowing.   Respiratory:  Negative for cough, shortness of breath and wheezing.   Cardiovascular:  Positive for leg swelling. Negative for chest pain.  Gastrointestinal:  Negative for abdominal distention and abdominal pain.  Neurological:  Negative for weakness.     Physical Exam Triage Vital Signs ED Triage Vitals  Enc Vitals Group     BP 10/01/22 1736 136/78     Pulse Rate 10/01/22 1736 75     Resp  10/01/22 1736 18     Temp 10/01/22 1736 98.4 F (36.9 C)     Temp Source 10/01/22 1736 Oral     SpO2 10/01/22 1736 97 %     Weight 10/01/22 1733 160 lb (72.6 kg)     Height 10/01/22 1733 '5\' 9"'$  (1.753 m)     Head Circumference --      Peak Flow --  Pain Score 10/01/22 1731 0     Pain Loc --      Pain Edu? --      Excl. in [place]? --    No data found.  Updated Vital Signs BP 136/78 (BP Location: Left Arm)   Pulse 75   Temp 98.4 F (36.9 C) (Oral)   Resp 18   Ht '5\' 9"'$  (1.753 m)   Wt 160 lb (72.6 kg)   SpO2 97%   BMI 23.63 kg/m     Physical Exam Vitals and nursing note reviewed.  Constitutional:      General: He is not in acute distress.    Appearance: Normal appearance. He is well-developed. He is not ill-appearing.  HENT:     Head: Normocephalic and atraumatic.     Nose: Nose normal.     Mouth/Throat:     Mouth: Mucous membranes are moist.     Pharynx: Oropharynx is clear.  Eyes:     General: No scleral icterus.    Conjunctiva/sclera: Conjunctivae normal.  Cardiovascular:     Rate and Rhythm: Normal rate and regular rhythm.     Heart sounds: Normal heart sounds.  Pulmonary:     Effort: Pulmonary effort is normal. No respiratory distress.     Breath sounds: Normal breath sounds.  Abdominal:     Palpations: Abdomen is soft.     Tenderness: There is no abdominal tenderness.  Musculoskeletal:        General: No swelling.     Cervical back: Neck supple.  Skin:    General: Skin is warm and dry.     Capillary Refill: Capillary refill takes less than 2 seconds.  Neurological:     General: No focal deficit present.     Mental Status: He is alert and oriented to person, place, and time. Mental status is at baseline.     Motor: No weakness.     Gait: Gait normal.  Psychiatric:        Mood and Affect: Mood normal.        Behavior: Behavior normal.      UC Treatments / Results  Labs (all labs ordered are listed, but only abnormal results are displayed) Labs  Reviewed - No data to display  EKG   Radiology No results found.  Procedures Procedures (including critical care time)  Medications Ordered in UC Medications - No data to display  Initial Impression / Assessment and Plan / UC Course  I have reviewed the triage vital signs and the nursing notes.  Pertinent labs & imaging results that were available during my care of the patient were reviewed by me and considered in my medical decision making (see chart for details).   72 year old male with history of chronic pain syndrome presents for swelling of his face, hands and feet since yesterday.  He says he has been taking Lyrica 100 mg 3 times daily for the past 3 weeks.  He reports that it has caused him a little swelling this entire time.  He was taking gabapentin before that and it also caused him swelling but he says he never experienced swelling of his feet or hands until he started the Lyrica.  Symptoms have mostly been over the past 2 days.  He has only taken Lyrica 1 time today and says that the swelling has improved and nearly resolved.  He only feels little bit of facial puffiness.  He denies any swallowing difficulty or lip swelling.  Denies feeling like his  tongue is swollen at this time.  No breathing difficulty.  He says he still feels like his hands and feet are little swollen but not as bad as they were.  Patient was switched to Lyrica and Suboxone from gabapentin and oxycodone at the end of December.  He cannot think of anything else that could have potentially caused the swelling.  Vitals normal and stable and he is overall well-appearing.  In no acute distress.  On exam I do not see any obvious swelling of his hands or feet.  No obvious intraoral swelling or facial swelling.  Patient insist that his face is more swollen than it typically is.  Throat is clear.  Chest clear to auscultation and heart regular rate and rhythm.  Advised patient that it is possible that his presumed swelling  could be due to Lyrica as that is a possible side effect.  Advised him to speak with his pain management specialist but not to take this medication anymore at this time.  I am concerned about potential angioedema the way that he is describing his symptoms.  Advised him to switch back to gabapentin at this time and continue with the Suboxone and reach out to his pain management specialist on Monday.  We reviewed going to the emergency department if he feels like any of the swelling is worsening or he has any difficulty breathing or swallowing.  He should call 911 for any acute worsening of his symptoms.  He is agreeable to plan.   Final Clinical Impressions(s) / UC Diagnoses   Final diagnoses:  Edema, unspecified type  Medication side effect  Chronic pain syndrome     Discharge Instructions      -I think your swelling is related to the Lyrica.  Stop taking this medication and go back to taking gabapentin  if you need it.  Contact your pain clinic back on Monday and let them know about your reaction to this medication. - If you have any increase in your swelling or any shortness of breath you should go to the emergency department.     ED Prescriptions   None    I have reviewed the PDMP during this encounter.   Danton Clap, PA-C 10/01/22 1937    Danton Clap, PA-C 10/01/22 [phone]

## 2022-10-01 NOTE — ED Triage Notes (Signed)
Pt c/o hand, feet, and facial swelling x2days  Pt states he is unsure why he is swelling and is unsure if it is because of his medicine. Pt spoke to 2 pharmacies and was told that his medication could and could not cause swelling.  Pt has neuropathy.  Pt has been taking Venlafaxine x2-88month, Pregabalin x221month risperidone x2m20monthAtorvastatin x3mo74month

## 2022-10-02 IMAGING — CR DG CHEST 2V
2 series · 2 of 2 positions shown · non-contrast
Comparison: Chest CT 11/15/2013.

CLINICAL DATA: 68-year-old male with chest pain, shortness of
breath. Pain with deep breaths x1 month. Right rib pain.

EXAM:
CHEST - 2 VIEW

[chest pa]
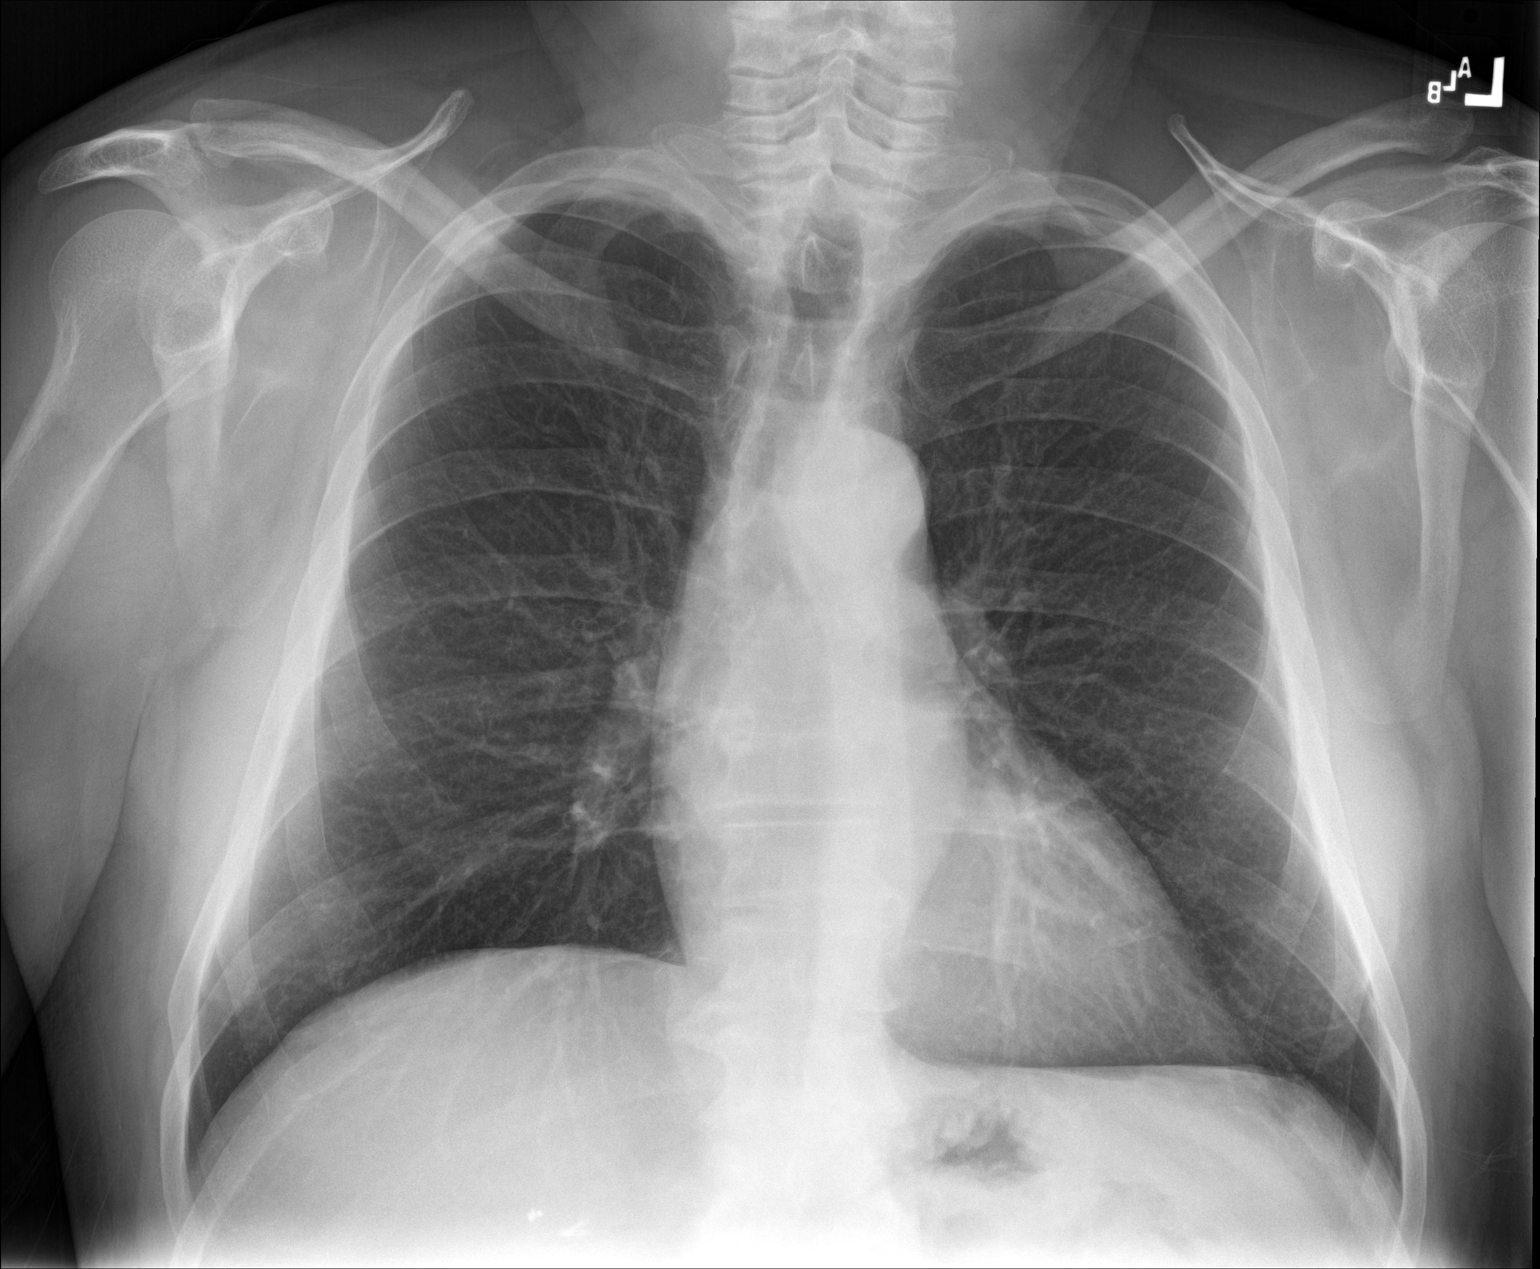

[chest lat]
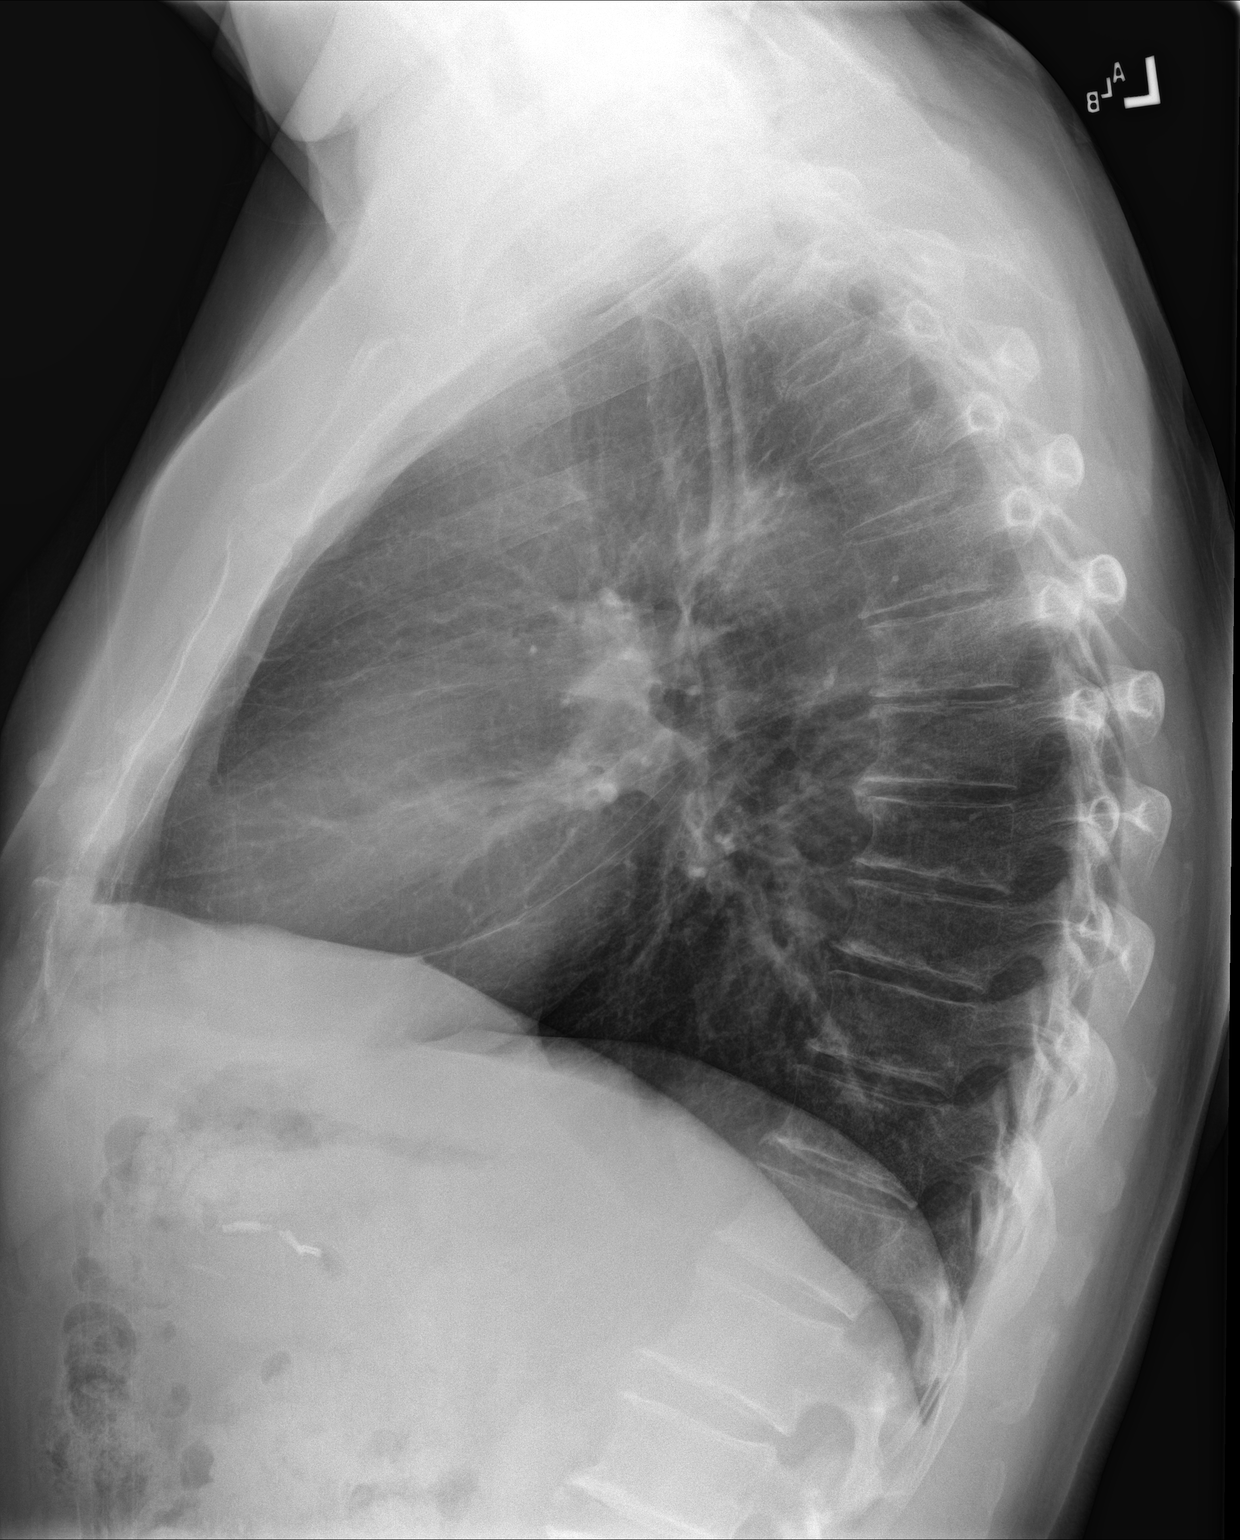

[2 of 2 positions shown; findings below may reference images not displayed]

FINDINGS: Lung volumes and mediastinal contours remain normal. Visualized
tracheal air column is within normal limits. Both lungs appear
clear. No pneumothorax or pleural effusion.

Cholecystectomy clips in the right upper quadrant are new since
1302. Negative visible bowel gas pattern.

No acute osseous abnormality identified, visible osseous structures
appear relatively normal for age.
IMPRESSION: Negative.  No acute cardiopulmonary abnormality.

## 2023-03-09 ENCOUNTER — Other Ambulatory Visit: Payer: Self-pay | Admitting: *Deleted

## 2023-03-09 ENCOUNTER — Ambulatory Visit: Payer: Self-pay | Admitting: Urology

## 2023-03-09 DIAGNOSIS — N3289 Other specified disorders of bladder: Secondary | ICD-10-CM

## 2023-03-16 ENCOUNTER — Ambulatory Visit: Payer: Medicare Other | Admitting: Urology

## 2023-04-28 ENCOUNTER — Other Ambulatory Visit: Payer: Self-pay | Admitting: Orthopedic Surgery

## 2023-04-28 DIAGNOSIS — M25511 Pain in right shoulder: Secondary | ICD-10-CM

## 2023-04-28 DIAGNOSIS — S46001A Unspecified injury of muscle(s) and tendon(s) of the rotator cuff of right shoulder, initial encounter: Secondary | ICD-10-CM

## 2023-04-28 DIAGNOSIS — S46009A Unspecified injury of muscle(s) and tendon(s) of the rotator cuff of unspecified shoulder, initial encounter: Secondary | ICD-10-CM

## 2023-04-29 ENCOUNTER — Other Ambulatory Visit: Payer: Self-pay | Admitting: Orthopedic Surgery

## 2023-04-29 DIAGNOSIS — M25511 Pain in right shoulder: Secondary | ICD-10-CM

## 2023-04-29 DIAGNOSIS — S46001A Unspecified injury of muscle(s) and tendon(s) of the rotator cuff of right shoulder, initial encounter: Secondary | ICD-10-CM

## 2023-04-29 DIAGNOSIS — M25311 Other instability, right shoulder: Secondary | ICD-10-CM

## 2023-05-03 ENCOUNTER — Ambulatory Visit
Admission: RE | Admit: 2023-05-03 | Discharge: 2023-05-03 | Disposition: A | Discharge status: Home / Self Care | Payer: Medicare Other | Source: Ambulatory Visit | Attending: Orthopedic Surgery | Admitting: Orthopedic Surgery

## 2023-05-03 DIAGNOSIS — M25311 Other instability, right shoulder: Secondary | ICD-10-CM | POA: Insufficient documentation

## 2023-05-03 DIAGNOSIS — M25511 Pain in right shoulder: Secondary | ICD-10-CM | POA: Diagnosis present

## 2023-05-03 DIAGNOSIS — S46001A Unspecified injury of muscle(s) and tendon(s) of the rotator cuff of right shoulder, initial encounter: Secondary | ICD-10-CM | POA: Insufficient documentation

## 2023-06-07 ENCOUNTER — Ambulatory Visit: Payer: Self-pay | Admitting: Urology

## 2023-06-22 ENCOUNTER — Encounter: Payer: Self-pay | Admitting: Urology

## 2023-06-22 ENCOUNTER — Other Ambulatory Visit: Payer: Self-pay | Admitting: *Deleted

## 2023-06-22 ENCOUNTER — Other Ambulatory Visit
Admission: RE | Admit: 2023-06-22 | Discharge: 2023-06-22 | Disposition: A | Discharge status: Home / Self Care | Payer: Medicare Other | Attending: Urology | Admitting: Urology

## 2023-06-22 ENCOUNTER — Ambulatory Visit: Payer: Medicare Other | Admitting: Urology

## 2023-06-22 VITALS — BP 116/74 | HR 92 | Ht 69.0 in | Wt 141.4 lb

## 2023-06-22 DIAGNOSIS — Z8546 Personal history of malignant neoplasm of prostate: Secondary | ICD-10-CM

## 2023-06-22 DIAGNOSIS — N3289 Other specified disorders of bladder: Secondary | ICD-10-CM | POA: Insufficient documentation

## 2023-06-22 DIAGNOSIS — C61 Malignant neoplasm of prostate: Secondary | ICD-10-CM

## 2023-06-22 DIAGNOSIS — R3129 Other microscopic hematuria: Secondary | ICD-10-CM

## 2023-06-22 LAB — BLADDER SCAN AMB NON-IMAGING

## 2023-06-22 LAB — URINALYSIS, COMPLETE (UACMP) WITH MICROSCOPIC
Glucose, UA: NEGATIVE mg/dL
Leukocytes,Ua: NEGATIVE
Nitrite: NEGATIVE
Protein, ur: NEGATIVE mg/dL
Specific Gravity, Urine: 1.025 (ref 1.005–1.030)
pH: 6 (ref 5.0–8.0)

## 2023-06-22 LAB — PSA: Prostatic Specific Antigen: 0.38 ng/mL (ref 0.00–4.00)

## 2023-06-22 NOTE — Addendum Note (Signed)
Addended by: Frankey Shown on: 06/22/2023 10:54 AM   Modules accepted: Orders

## 2023-06-22 NOTE — Patient Instructions (Signed)

## 2023-06-22 NOTE — Progress Notes (Signed)
06/22/23 10:41 AM   Ricky Mercer [birth year] [id number]  CC: Bladder wall thickening, history of prostate cancer, new microscopic hematuria  HPI: 72 year old male previously followed by Dr. Sheppard Penton transferring his care to [facility] health urology after Dr. Terrance Mass retirement.  He has a history of prostate cancer treated with radiation in 2007, most recent PSA from November 2021 was low at 0.38.  He denies any urinary symptoms or gross hematuria.  He also had bladder wall thickening that was seen on his CT from 2021.  Urinalysis today 11-20 RBC, few bacteria, calcium oxalate crystals present.  PVR normal at 0 ml.  This PSA November 2021 0.38.  PMH: Past Medical History:  Diagnosis Date   Acute postoperative pain 03/21/2017   Anginal pain (HCC)    chest pain w high risk for cardiac etiology   Bilateral arm pain    Cardiomyopathy (HCC)    Cardiomyopathy, idiopathic (HCC)    Chronic back pain    Chronic pain syndrome    Depression    Diverticulosis    Dupuytren's contracture    Erosive gastritis    Fibromyalgia    Foot pain, bilateral    GERD (gastroesophageal reflux disease)    Hand weakness    Hyperlipidemia    Knee pain    Midline low back pain    Neuromuscular disorder (HCC)    lumbar polyradiculopathy, numbness and tingling of both legs   Opioid dependence (HCC)    Osteoarthritis    neck and back   Pain in neck    hurts to turn side to side   Palpitations    Prostate cancer (HCC)    prostate   Reactive airway disease    Seasonal allergies    Situational anxiety    Skin cancer    Sleep apnea    CPAP - only uses "sometimes"   Tubular adenoma of colon     Surgical History: Past Surgical History:  Procedure Laterality Date   CHOLECYSTECTOMY N/A 12/02/2015   Procedure: LAPAROSCOPIC CHOLECYSTECTOMY;  Surgeon: Nadeen Landau, MD;  Location: [facility] ORS;  Service: General;  Laterality: N/A;   COLONOSCOPY  10/24/2012   COLONOSCOPY WITH PROPOFOL N/A 11/17/2017    Procedure: COLONOSCOPY WITH PROPOFOL;  Surgeon: Christena Deem, MD;  Location: [facility] ENDOSCOPY;  Service: Endoscopy;  Laterality: N/A;   COLONOSCOPY WITH PROPOFOL N/A 11/03/2020   Procedure: COLONOSCOPY WITH PROPOFOL;  Surgeon: Regis Bill, MD;  Location: [facility] ENDOSCOPY;  Service: Endoscopy;  Laterality: N/A;   ESOPHAGOGASTRODUODENOSCOPY N/A 11/17/2017   Procedure: ESOPHAGOGASTRODUODENOSCOPY (EGD);  Surgeon: Christena Deem, MD;  Location[facility] ENDOSCOPY;  Service: Endoscopy;  Laterality: N/A;   ESOPHAGOGASTRODUODENOSCOPY N/A 03/24/2020   Procedure: ESOPHAGOGASTRODUODENOSCOPY (EGD);  Surgeon: Regis Bill, MD;  Location: [facility] ENDOSCOPY;  Service: Endoscopy;  Laterality: N/A;   ESOPHAGOGASTRODUODENOSCOPY (EGD) WITH PROPOFOL N/A 10/28/2015   Procedure: ESOPHAGOGASTRODUODENOSCOPY (EGD) WITH PROPOFOL;  Surgeon: Christena Deem, MD;  Location: [facility] ENDOSCOPY;  Service: Endoscopy;  Laterality: N/A;   HAND SURGERY Bilateral    HERNIA REPAIR     MASS EXCISION Right 07/18/2015   Procedure: EXCISION MASS RIGHT HAND;  Surgeon: Erin Sons, MD;  Location: [facility];  Service: Orthopedics;  Laterality: Right;  CPAP   NOSE SURGERY     prostate seeding     SKIN CANCER EXCISION Left 2015   shoulder   TONSILLECTOMY  2009   Surgery for OSA (screw in place)   UPPER GI ENDOSCOPY  12/18/2012   x3  Family History: Family History  Problem Relation Age of Onset   Lung cancer Father    Prostate cancer Brother    Colon cancer Brother    Hyperlipidemia Sister     Social History:  reports that he has never smoked. He has never used smokeless tobacco. He reports that he does not drink alcohol and does not use drugs.  Physical Exam: BP 116/74 (BP Location: Left Arm, Patient Position: Sitting, Cuff Size: Normal)   Pulse 92   Ht 5\' 9"  (1.753 m)   Wt 141 lb 6.4 oz (64.1 kg)   BMI 20.88 kg/m    Constitutional:  Alert and oriented, No acute distress. Cardiovascular: No  clubbing, cyanosis, or edema. Respiratory: Normal respiratory effort, no increased work of breathing. GI: Abdomen is soft, nontender, nondistended, no abdominal masses   Laboratory Data: Reviewed, see HPI  Pertinent Imaging: I have personally viewed and interpreted the CT abdomen and pelvis with contrast from 2021 showing a 22 g prostate, bladder wall thickening is subtle and likely a incidental and benign finding.  Assessment & Plan:   72 year old male with history of prostate cancer treated by Dr. Sheppard Penton and Dr. Aggie Cosier in 2007 with radiation, grade and stage unknown.  Most recent PSA from 2021 was low at 0.38.  He also has new microscopic hematuria today with 11-20 RBC. We discussed common possible etiologies of microscopic hematuria including idiopathic, urolithiasis, medical renal disease, and malignancy. We discussed the new asymptomatic microscopic hematuria guidelines and risk categories of low, intermediate, and high risk that are based on age, risk factors like smoking, and degree of microscopic hematuria. We discussed work-up can range from repeat urinalysis, renal ultrasound and cystoscopy, to CT urogram and cystoscopy.  They fall into the high risk category for microscopic hematuria, and I recommended proceeding with CT urogram and cystoscopy.  PSA today, call with results  Legrand Rams, MD 06/22/2023  [facility] [address], [address] [place], [place] [address] [phone]

## 2023-08-19 ENCOUNTER — Telehealth: Payer: Self-pay | Admitting: Urology

## 2023-08-19 NOTE — Telephone Encounter (Signed)
Pt. Daughter Shella Maxim brought in her dads records for appointment on 08-23-23. He is scheduled to have a CYSTO w/CT prior. Pt. Has not had the CT because he refused it. Can he still be seen on this date or need to reschedule. Pt daughter would like a call back to know what to do please and Thanks. She knows it is Friday and is ok with getting a call on Monday. Shella Maxim [phone]

## 2023-08-22 NOTE — Telephone Encounter (Signed)
Called daughter advised her that the pt can come for cystoscopy tomorrow however the CT scan is an important part of the work up and will need to be completed. Daughter voiced understanding and states that she will "talk to him about making the CT appt"

## 2023-08-23 ENCOUNTER — Ambulatory Visit: Payer: Medicare Other | Admitting: Urology

## 2023-08-23 ENCOUNTER — Encounter: Payer: Self-pay | Admitting: Urology

## 2023-08-23 VITALS — BP 124/70 | HR 76 | Ht 69.0 in | Wt 141.0 lb

## 2023-08-23 DIAGNOSIS — R3129 Other microscopic hematuria: Secondary | ICD-10-CM | POA: Diagnosis not present

## 2023-08-23 MED ORDER — CEPHALEXIN 250 MG PO CAPS
500.0000 mg | ORAL_CAPSULE | Freq: Once | ORAL | Status: AC
  Administered 2023-08-23: 500 mg via ORAL

## 2023-08-23 MED ORDER — LIDOCAINE HCL URETHRAL/MUCOSAL 2 % EX GEL
1.0000 | Freq: Once | CUTANEOUS | Status: AC
  Administered 2023-08-23: 1 via URETHRAL

## 2023-08-23 NOTE — Progress Notes (Signed)
Cystoscopy Procedure Note:  Indication: Microscopic hematuria  History of prostate cancer treated by Dr. Sheppard Penton and Dr. Aggie Cosier in 2007 with radiation, PSA remains very low, most recently 0.38.  After informed consent and discussion of the procedure and its risks, Ricky Mercer was positioned and prepped in the standard fashion. Cystoscopy was performed with a flexible cystoscope. The urethra, bladder neck and entire bladder was visualized in a standard fashion. The prostate was small. The ureteral orifices were visualized in their normal location and orientation.  Bladder mucosa grossly normal throughout, no abnormalities on retroflexion  Imaging: CT ordered, has not yet been completed  Findings: Normal cystoscopy  Assessment and Plan: Will call with CT results, if normal likely can follow-up with urology as needed and PSA can be checked yearly by PCP  Legrand Rams, MD 08/23/2023

## 2023-08-23 NOTE — Patient Instructions (Signed)
Please call [phone] to schedule your CT scan.

## 2023-10-04 ENCOUNTER — Other Ambulatory Visit: Payer: Self-pay | Admitting: Family Medicine

## 2023-10-04 DIAGNOSIS — R1011 Right upper quadrant pain: Secondary | ICD-10-CM

## 2023-10-06 ENCOUNTER — Ambulatory Visit
Admission: RE | Admit: 2023-10-06 | Discharge: 2023-10-06 | Disposition: A | Discharge status: Home / Self Care | Payer: Medicare Other | Source: Ambulatory Visit | Attending: Family Medicine | Admitting: Family Medicine

## 2023-10-06 DIAGNOSIS — R1011 Right upper quadrant pain: Secondary | ICD-10-CM | POA: Diagnosis present

## 2024-02-24 ENCOUNTER — Ambulatory Visit: Admitting: Anesthesiology

## 2024-02-24 ENCOUNTER — Other Ambulatory Visit: Payer: Self-pay

## 2024-02-24 ENCOUNTER — Encounter: Payer: Self-pay | Admitting: Gastroenterology

## 2024-02-24 ENCOUNTER — Encounter
Admission: RE | Disposition: A | Discharge status: Home / Self Care | Payer: Self-pay | Source: Home / Self Care | Attending: Gastroenterology

## 2024-02-24 ENCOUNTER — Ambulatory Visit
Admission: RE | Admit: 2024-02-24 | Discharge: 2024-02-24 | Disposition: A | Discharge status: Home / Self Care | Attending: Gastroenterology | Admitting: Gastroenterology

## 2024-02-24 DIAGNOSIS — K5730 Diverticulosis of large intestine without perforation or abscess without bleeding: Secondary | ICD-10-CM | POA: Diagnosis not present

## 2024-02-24 DIAGNOSIS — K219 Gastro-esophageal reflux disease without esophagitis: Secondary | ICD-10-CM | POA: Diagnosis not present

## 2024-02-24 DIAGNOSIS — Z1211 Encounter for screening for malignant neoplasm of colon: Secondary | ICD-10-CM | POA: Diagnosis present

## 2024-02-24 DIAGNOSIS — I2089 Other forms of angina pectoris: Secondary | ICD-10-CM | POA: Diagnosis not present

## 2024-02-24 DIAGNOSIS — F32A Depression, unspecified: Secondary | ICD-10-CM | POA: Insufficient documentation

## 2024-02-24 DIAGNOSIS — K641 Second degree hemorrhoids: Secondary | ICD-10-CM | POA: Insufficient documentation

## 2024-02-24 DIAGNOSIS — K5140 Inflammatory polyps of colon without complications: Secondary | ICD-10-CM | POA: Diagnosis not present

## 2024-02-24 DIAGNOSIS — G894 Chronic pain syndrome: Secondary | ICD-10-CM | POA: Diagnosis not present

## 2024-02-24 DIAGNOSIS — G4733 Obstructive sleep apnea (adult) (pediatric): Secondary | ICD-10-CM | POA: Diagnosis not present

## 2024-02-24 SURGERY — COLONOSCOPY
Anesthesia: General

## 2024-02-24 MED ORDER — PROPOFOL 10 MG/ML IV BOLUS
INTRAVENOUS | Status: DC | PRN
  Administered 2024-02-24: 50 mg via INTRAVENOUS

## 2024-02-24 MED ORDER — PROPOFOL 500 MG/50ML IV EMUL
INTRAVENOUS | Status: DC | PRN
  Administered 2024-02-24: 75 ug/kg/min via INTRAVENOUS

## 2024-02-24 MED ORDER — SODIUM CHLORIDE 0.9 % IV SOLN: INTRAVENOUS | Status: DC

## 2024-02-24 MED ORDER — LIDOCAINE HCL (CARDIAC) PF 100 MG/5ML IV SOSY
PREFILLED_SYRINGE | INTRAVENOUS | Status: DC | PRN
  Administered 2024-02-24: 60 mg via INTRAVENOUS

## 2024-02-24 MED ORDER — DEXMEDETOMIDINE HCL IN NACL 80 MCG/20ML IV SOLN
INTRAVENOUS | Status: DC | PRN
  Administered 2024-02-24: 20 ug via INTRAVENOUS

## 2024-02-24 NOTE — Interval H&P Note (Signed)
 History and Physical Interval Note:  02/24/2024 1:15 PM  Ricky Mercer  has presented today for surgery, with the diagnosis of h/o TA Polyps.  The various methods of treatment have been discussed with the patient and family. After consideration of risks, benefits and other options for treatment, the patient has consented to  Procedure(s): COLONOSCOPY (N/A) as a surgical intervention.  The patient's history has been reviewed, patient examined, no change in status, stable for surgery.  I have reviewed the patient's chart and labs.  Questions were answered to the patient's satisfaction.     Shane Darling  Ok to proceed with colonoscopy

## 2024-02-24 NOTE — H&P (Signed)
 Outpatient short stay form Pre-procedure 02/24/2024  Shane Darling, MD  Primary Physician: Lorrie Rothman, MD  Reason for visit:  Surveillance  History of present illness:    73 y/o gentleman with history of chronic pain, OSA, and depression here for colonoscopy for history of small Ta's on last colonoscopy in 2022. No blood thinners. No family history of GI malignancies. No significant abdominal surgeries.    Current Facility-Administered Medications:    0.9 %  sodium chloride  infusion, , Intravenous, Continuous, Merilynn Haydu, Leanora Prophet, MD, Last Rate: 20 mL/hr at 02/24/24 1306, New Bag at 02/24/24 1306  Medications Prior to Admission  Medication Sig Dispense Refill Last Dose/Taking   atorvastatin (LIPITOR) 80 MG tablet Take 1 tablet by mouth daily.   02/23/2024   buprenorphine (BUTRANS) 15 MCG/HR 1 patch once a week.   Past Week   buPROPion (WELLBUTRIN XL) 150 MG 24 hr tablet Take 150 mg by mouth every morning.   02/23/2024   calcium-vitamin D  (OSCAL WITH D) 500-5 MG-MCG tablet Take 1 tablet by mouth.   Past Week   desvenlafaxine (PRISTIQ) 100 MG 24 hr tablet Take 100 mg by mouth every morning.   02/23/2024   Multiple Vitamin (MULTI-VITAMIN) tablet Take 1 tablet by mouth daily.   Past Week   aspirin 81 MG tablet Take 81 mg by mouth daily.      clonazePAM (KLONOPIN) 0.5 MG tablet TAKE 1-2 TAB BY MOUTH AT BEDTIME AS NEEDED FOR INSOMNIA      escitalopram (LEXAPRO) 10 MG tablet Take 10 mg by mouth every morning.      gabapentin  (NEURONTIN ) 300 MG capsule Take 300 mg by mouth 2 (two) times daily.      oxyCODONE  (OXY IR/ROXICODONE ) 5 MG immediate release tablet Take 1 tablet (5 mg total) by mouth every 8 (eight) hours as needed for severe pain. Must last 30 days 90 tablet 0    pantoprazole (PROTONIX) 40 MG tablet Take 40 mg by mouth daily.      pentazocine-naloxone (TALWIN NX) 50-0.5 MG tablet take 1 tablet by mouth four times a day as needed for pain      pregabalin  (LYRICA ) 100 MG  capsule Take 100 mg by mouth 3 (three) times daily.      risperiDONE (RISPERDAL) 1 MG tablet Take 1 mg by mouth at bedtime.      simvastatin (ZOCOR) 10 MG tablet TAKE 1 TABLET BY MOUTH EVERY DAY AT NIGHT        No Known Allergies   Past Medical History:  Diagnosis Date   Acute postoperative pain 03/21/2017   Anginal pain (HCC)    chest pain w high risk for cardiac etiology   Bilateral arm pain    Cardiomyopathy (HCC)    Cardiomyopathy, idiopathic (HCC)    Chronic back pain    Chronic pain syndrome    Depression    Diverticulosis    Dupuytren's contracture    Erosive gastritis    Fibromyalgia    Foot pain, bilateral    GERD (gastroesophageal reflux disease)    Hand weakness    Hyperlipidemia    Knee pain    Midline low back pain    Neuromuscular disorder (HCC)    lumbar polyradiculopathy, numbness and tingling of both legs   Opioid dependence (HCC)    Osteoarthritis    neck and back   Pain in neck    hurts to turn side to side   Palpitations    Prostate cancer (HCC)  prostate   Reactive airway disease    Seasonal allergies    Situational anxiety    Skin cancer    Sleep apnea    CPAP - only uses sometimes   Tubular adenoma of colon     Review of systems:  Otherwise negative.    Physical Exam  Gen: Alert, oriented. Appears stated age.  HEENT: PERRLA. Lungs: No respiratory distress CV: RRR Abd: soft, benign, no masses Ext: No edema    Planned procedures: Proceed with colonoscopy. The patient understands the nature of the planned procedure, indications, risks, alternatives and potential complications including but not limited to bleeding, infection, perforation, damage to internal organs and possible oversedation/side effects from anesthesia. The patient agrees and gives consent to proceed.  Please refer to procedure notes for findings, recommendations and patient disposition/instructions.     Shane Darling, MD [facility]  Gastroenterology

## 2024-02-24 NOTE — Op Note (Signed)
 [facility] Gastroenterology Patient Name: Ricky Mercer Procedure Date: 02/24/2024 1:13 PM MRN: [id number] Account #: [id number] Date of Birth: 04/17/1951 Admit Type: Outpatient Age: 73 Room: [facility] ENDO ROOM 3 Gender: Male Note Status: Finalized Instrument Name: Charlyn Cooley [id number] Procedure:             Colonoscopy Indications:           Surveillance: Personal history of adenomatous polyps                         on last colonoscopy 3 years ago Providers:             Leida Puna MD, MD Referring MD:          Lorrie Rothman (Referring MD) Medicines:             Monitored Anesthesia Care Complications:         No immediate complications. Estimated blood loss:                         Minimal. Procedure:             Pre-Anesthesia Assessment:                        - Prior to the procedure, a History and Physical was                         performed, and patient medications and allergies were                         reviewed. The patient is competent. The risks and                         benefits of the procedure and the sedation options and                         risks were discussed with the patient. All questions                         were answered and informed consent was obtained.                         Patient identification and proposed procedure were                         verified by the physician, the nurse, the                         anesthesiologist, the anesthetist and the technician                         in the endoscopy suite. Mental Status Examination:                         alert and oriented. Airway Examination: normal                         oropharyngeal airway and neck mobility. Respiratory  Examination: clear to auscultation. CV Examination:                         normal. Prophylactic Antibiotics: The patient does not                         require prophylactic antibiotics. Prior                          Anticoagulants: The patient has taken no anticoagulant                         or antiplatelet agents. ASA Grade Assessment: III - A                         patient with severe systemic disease. After reviewing                         the risks and benefits, the patient was deemed in                         satisfactory condition to undergo the procedure. The                         anesthesia plan was to use monitored anesthesia care                         (MAC). Immediately prior to administration of                         medications, the patient was re-assessed for adequacy                         to receive sedatives. The heart rate, respiratory                         rate, oxygen saturations, blood pressure, adequacy of                         pulmonary ventilation, and response to care were                         monitored throughout the procedure. The physical                         status of the patient was re-assessed after the                         procedure.                        After obtaining informed consent, the colonoscope was                         passed under direct vision. Throughout the procedure,                         the patient's blood pressure, pulse, and oxygen  saturations were monitored continuously. The                         Colonoscope was introduced through the anus and                         advanced to the the cecum, identified by appendiceal                         orifice and ileocecal valve. The colonoscopy was                         performed without difficulty. The patient tolerated                         the procedure well. The quality of the bowel                         preparation was good. The ileocecal valve, appendiceal                         orifice, and rectum were photographed. Findings:      The perianal and digital rectal examinations were normal.      A 2 mm polyp was found in the sigmoid  colon. The polyp was sessile. The       polyp was removed with a cold snare. Resection and retrieval were       complete. Estimated blood loss was minimal.      A few large-mouthed and small-mouthed diverticula were found in the       recto-sigmoid colon.      Internal hemorrhoids were found during retroflexion. The hemorrhoids       were Grade II (internal hemorrhoids that prolapse but reduce       spontaneously).      The exam was otherwise without abnormality on direct and retroflexion       views. Impression:            - One 2 mm polyp in the sigmoid colon, removed with a                         cold snare. Resected and retrieved.                        - Diverticulosis in the recto-sigmoid colon.                        - Internal hemorrhoids.                        - The examination was otherwise normal on direct and                         retroflexion views. Recommendation:        - Discharge patient to home.                        - Resume previous diet.                        - Continue present medications.                        -  Await pathology results.                        - Repeat colonoscopy is not recommended due to current                         age (45 years or older) for surveillance.                        - Return to referring physician as previously                         scheduled. Procedure Code(s):     --- Professional ---                        [phone], Colonoscopy, flexible; with removal of                         tumor(s), polyp(s), or other lesion(s) by snare                         technique Diagnosis Code(s):     --- Professional ---                        Z86.010, Personal history of colonic polyps                        D12.5, Benign neoplasm of sigmoid colon                        K64.1, Second degree hemorrhoids                        K57.30, Diverticulosis of large intestine without                         perforation or abscess without  bleeding CPT copyright 2022 [employer]. All rights reserved. The codes documented in this report are preliminary and upon coder review may  be revised to meet current compliance requirements. Leida Puna MD, MD 02/24/2024 1:44:10 PM Number of Addenda: 0 Note Initiated On: 02/24/2024 1:13 PM Scope Withdrawal Time: 0 hours 10 minutes 4 seconds  Total Procedure Duration: 0 hours 12 minutes 36 seconds  Estimated Blood Loss:  Estimated blood loss was minimal.      [facility]

## 2024-02-24 NOTE — Transfer of Care (Signed)
 Immediate Anesthesia Transfer of Care Note  Patient: Ricky Mercer  Procedure(s) Performed: COLONOSCOPY POLYPECTOMY, INTESTINE  Patient Location: PACU  Anesthesia Type:General  Level of Consciousness: sedated  Airway & Oxygen Therapy: Patient Spontanous Breathing  Post-op Assessment: Report given to RN and Post -op Vital signs reviewed and stable  Post vital signs: Reviewed and stable  Last Vitals:  Vitals Value Taken Time  BP 108/69 02/24/24 13:45  Temp 35.9 C 02/24/24 13:45  Pulse 62 02/24/24 13:46  Resp 10 02/24/24 13:46  SpO2 99 % 02/24/24 13:46  Vitals shown include unfiled device data.  Last Pain:  Vitals:   02/24/24 1345  TempSrc:   PainSc: 0-No pain         Complications: No notable events documented.

## 2024-02-24 NOTE — Anesthesia Preprocedure Evaluation (Signed)
 Anesthesia Evaluation  Patient identified by MRN, date of birth, ID band Patient awake    Reviewed: Allergy & Precautions, NPO status , Patient's Chart, lab work & pertinent test results  Airway Mallampati: III  TM Distance: <3 FB Neck ROM: full    Dental  (+) Teeth Intact   Pulmonary neg pulmonary ROS, sleep apnea    Pulmonary exam normal breath sounds clear to auscultation       Cardiovascular Exercise Tolerance: Good + angina with exertion negative cardio ROS Normal cardiovascular exam Rhythm:Regular Rate:Normal     Neuro/Psych  PSYCHIATRIC DISORDERS  Depression     Neuromuscular disease negative neurological ROS  negative psych ROS   GI/Hepatic negative GI ROS, Neg liver ROS,GERD  Medicated,,  Endo/Other  negative endocrine ROS    Renal/GU negative Renal ROS  negative genitourinary   Musculoskeletal  (+) Arthritis ,  Fibromyalgia -  Abdominal Normal abdominal exam  (+)   Peds negative pediatric ROS (+)  Hematology negative hematology ROS (+)   Anesthesia Other Findings Past Medical History: 03/21/2017: Acute postoperative pain No date: Anginal pain (HCC)     Comment:  chest pain w high risk for cardiac etiology No date: Bilateral arm pain No date: Cardiomyopathy (HCC) No date: Cardiomyopathy, idiopathic (HCC) No date: Chronic back pain No date: Chronic pain syndrome No date: Depression No date: Diverticulosis No date: Dupuytren's contracture No date: Erosive gastritis No date: Fibromyalgia No date: Foot pain, bilateral No date: GERD (gastroesophageal reflux disease) No date: Hand weakness No date: Hyperlipidemia No date: Knee pain No date: Midline low back pain No date: Neuromuscular disorder (HCC)     Comment:  lumbar polyradiculopathy, numbness and tingling of both               legs No date: Opioid dependence (HCC) No date: Osteoarthritis     Comment:  neck and back No date: Pain in neck      Comment:  hurts to turn side to side No date: Palpitations No date: Prostate cancer (HCC)     Comment:  prostate No date: Reactive airway disease No date: Seasonal allergies No date: Situational anxiety No date: Skin cancer No date: Sleep apnea     Comment:  CPAP - only uses sometimes No date: Tubular adenoma of colon  Past Surgical History: 12/02/2015: CHOLECYSTECTOMY; N/A     Comment:  Procedure: LAPAROSCOPIC CHOLECYSTECTOMY;  Surgeon:               Benancio Bracket, MD;  Location: [facility] ORS;  Service:               General;  Laterality: N/A; 10/24/2012: COLONOSCOPY 11/17/2017: COLONOSCOPY WITH PROPOFOL ; N/A     Comment:  Procedure: COLONOSCOPY WITH PROPOFOL ;  Surgeon:               Deveron Fly, MD;  Location: [facility] ENDOSCOPY;                Service: Endoscopy;  Laterality: N/A; 11/03/2020: COLONOSCOPY WITH PROPOFOL ; N/A     Comment:  Procedure: COLONOSCOPY WITH PROPOFOL ;  Surgeon:               Shane Darling, MD;  Location: [facility] ENDOSCOPY;                Service: Endoscopy;  Laterality: N/A; 11/17/2017: ESOPHAGOGASTRODUODENOSCOPY; N/A     Comment:  Procedure: ESOPHAGOGASTRODUODENOSCOPY (EGD);  Surgeon:  Deveron Fly, MD;  Location: [facility] ENDOSCOPY;                Service: Endoscopy;  Laterality: N/A; 03/24/2020: ESOPHAGOGASTRODUODENOSCOPY; N/A     Comment:  Procedure: ESOPHAGOGASTRODUODENOSCOPY (EGD);  Surgeon:               Shane Darling, MD;  Location: [facility] ENDOSCOPY;                Service: Endoscopy;  Laterality: N/A; 10/28/2015: ESOPHAGOGASTRODUODENOSCOPY (EGD) WITH PROPOFOL ; N/A     Comment:  Procedure: ESOPHAGOGASTRODUODENOSCOPY (EGD) WITH               PROPOFOL ;  Surgeon: Deveron Fly, MD;  Location:               [facility] ENDOSCOPY;  Service: Endoscopy;  Laterality: N/A; No date: HAND SURGERY; Bilateral No date: HERNIA REPAIR 07/18/2015: MASS EXCISION; Right     Comment:  Procedure: EXCISION MASS RIGHT HAND;  Surgeon: Josephus Nida, MD;  Location: [facility];  Service:               Orthopedics;  Laterality: Right;  CPAP No date: NOSE SURGERY No date: prostate seeding 2015: SKIN CANCER EXCISION; Left     Comment:  shoulder 2009: TONSILLECTOMY     Comment:  Surgery for OSA (screw in place) 12/18/2012: UPPER GI ENDOSCOPY     Comment:  x3  BMI    Body Mass Index: 24.37 kg/m      Reproductive/Obstetrics negative OB ROS                             Anesthesia Physical Anesthesia Plan  ASA: 3  Anesthesia Plan: General   Post-op Pain Management:    Induction: Intravenous  PONV Risk Score and Plan: Propofol  infusion and TIVA  Airway Management Planned: Natural Airway and Nasal Cannula  Additional Equipment:   Intra-op Plan:   Post-operative Plan:   Informed Consent: I have reviewed the patients History and Physical, chart, labs and discussed the procedure including the risks, benefits and alternatives for the proposed anesthesia with the patient or authorized representative who has indicated his/her understanding and acceptance.     Dental Advisory Given  Plan Discussed with: CRNA  Anesthesia Plan Comments:        Anesthesia Quick Evaluation

## 2024-02-24 NOTE — Anesthesia Postprocedure Evaluation (Signed)
 Anesthesia Post Note  Patient: Ricky Mercer  Procedure(s) Performed: COLONOSCOPY POLYPECTOMY, INTESTINE  Patient location during evaluation: PACU Anesthesia Type: General Level of consciousness: awake Pain management: satisfactory to patient Vital Signs Assessment: post-procedure vital signs reviewed and stable Respiratory status: spontaneous breathing Cardiovascular status: stable Anesthetic complications: no   No notable events documented.   Last Vitals:  Vitals:   02/24/24 1345 02/24/24 1355  BP: 108/69 103/70  Pulse: 60 68  Resp: 11 (!) 9  Temp: (!) 35.9 C   SpO2: 98% 100%    Last Pain:  Vitals:   02/24/24 1355  TempSrc:   PainSc: 0-No pain                 VAN STAVEREN,Alexi Geibel

## 2024-02-27 LAB — SURGICAL PATHOLOGY
